# Patient Record
Sex: Female | Born: 1935
Health system: Southern US, Community
[De-identification: ages and names within clinical notes are randomized; demographics above are authoritative.]

## PROBLEM LIST (undated history)

## (undated) ENCOUNTER — Emergency Department (HOSPITAL_COMMUNITY): Admission: EM | Payer: Medicare Other | Source: Home / Self Care

## (undated) DIAGNOSIS — I340 Nonrheumatic mitral (valve) insufficiency: Secondary | ICD-10-CM

## (undated) DIAGNOSIS — M810 Age-related osteoporosis without current pathological fracture: Secondary | ICD-10-CM

## (undated) DIAGNOSIS — Z79890 Hormone replacement therapy (postmenopausal): Secondary | ICD-10-CM

## (undated) DIAGNOSIS — E785 Hyperlipidemia, unspecified: Secondary | ICD-10-CM

## (undated) DIAGNOSIS — I63442 Cerebral infarction due to embolism of left cerebellar artery: Secondary | ICD-10-CM

## (undated) DIAGNOSIS — M5030 Other cervical disc degeneration, unspecified cervical region: Secondary | ICD-10-CM

## (undated) DIAGNOSIS — I10 Essential (primary) hypertension: Secondary | ICD-10-CM

## (undated) DIAGNOSIS — K5730 Diverticulosis of large intestine without perforation or abscess without bleeding: Secondary | ICD-10-CM

## (undated) DIAGNOSIS — F419 Anxiety disorder, unspecified: Secondary | ICD-10-CM

## (undated) DIAGNOSIS — I4819 Other persistent atrial fibrillation: Secondary | ICD-10-CM

## (undated) DIAGNOSIS — J3801 Paralysis of vocal cords and larynx, unilateral: Secondary | ICD-10-CM

## (undated) DIAGNOSIS — H269 Unspecified cataract: Secondary | ICD-10-CM

## (undated) DIAGNOSIS — I422 Other hypertrophic cardiomyopathy: Secondary | ICD-10-CM

## (undated) DIAGNOSIS — L659 Nonscarring hair loss, unspecified: Secondary | ICD-10-CM

## (undated) DIAGNOSIS — D329 Benign neoplasm of meninges, unspecified: Secondary | ICD-10-CM

## (undated) DIAGNOSIS — G450 Vertebro-basilar artery syndrome: Secondary | ICD-10-CM

## (undated) DIAGNOSIS — M1990 Unspecified osteoarthritis, unspecified site: Secondary | ICD-10-CM

## (undated) DIAGNOSIS — I272 Pulmonary hypertension, unspecified: Secondary | ICD-10-CM

## (undated) HISTORY — PX: VEIN SURGERY: SHX48

## (undated) HISTORY — DX: Age-related osteoporosis without current pathological fracture: M81.0

## (undated) HISTORY — DX: Paralysis of vocal cords and larynx, unilateral: J38.01

## (undated) HISTORY — DX: Anxiety disorder, unspecified: F41.9

## (undated) HISTORY — PX: EYE SURGERY: SHX253

## (undated) HISTORY — DX: Diverticulosis of large intestine without perforation or abscess without bleeding: K57.30

## (undated) HISTORY — PX: TONSILLECTOMY: SHX5217

## (undated) HISTORY — DX: Vertebro-basilar artery syndrome: G45.0

## (undated) HISTORY — DX: Unspecified cataract: H26.9

## (undated) HISTORY — DX: Other cervical disc degeneration, unspecified cervical region: M50.30

## (undated) HISTORY — DX: Benign neoplasm of meninges, unspecified: D32.9

## (undated) HISTORY — DX: Other persistent atrial fibrillation: I48.19

## (undated) HISTORY — DX: Nonrheumatic mitral (valve) insufficiency: I34.0

## (undated) HISTORY — DX: Other hypertrophic cardiomyopathy: I42.2

## (undated) HISTORY — DX: Nonscarring hair loss, unspecified: L65.9

## (undated) HISTORY — PX: CATARACT EXTRACTION, BILATERAL: SHX1313

## (undated) HISTORY — PX: ABDOMINAL HYSTERECTOMY: SHX81

## (undated) HISTORY — DX: Pulmonary hypertension, unspecified: I27.20

## (undated) HISTORY — DX: Cerebral infarction due to embolism of left cerebellar artery: I63.442

## (undated) HISTORY — DX: Essential (primary) hypertension: I10

## (undated) HISTORY — DX: Hormone replacement therapy: Z79.890

## (undated) HISTORY — DX: Unspecified osteoarthritis, unspecified site: M19.90

## (undated) HISTORY — DX: Hyperlipidemia, unspecified: E78.5

---

## 1997-12-28 ENCOUNTER — Other Ambulatory Visit: Admission: RE | Admit: 1997-12-28 | Discharge: 1997-12-28 | Payer: Self-pay | Admitting: Obstetrics & Gynecology

## 1999-01-03 ENCOUNTER — Other Ambulatory Visit: Admission: RE | Admit: 1999-01-03 | Discharge: 1999-01-03 | Payer: Self-pay | Admitting: Obstetrics & Gynecology

## 2000-01-10 ENCOUNTER — Other Ambulatory Visit: Admission: RE | Admit: 2000-01-10 | Discharge: 2000-01-10 | Payer: Self-pay | Admitting: Obstetrics & Gynecology

## 2001-01-27 ENCOUNTER — Other Ambulatory Visit: Admission: RE | Admit: 2001-01-27 | Discharge: 2001-01-27 | Payer: Self-pay | Admitting: Obstetrics & Gynecology

## 2001-02-10 ENCOUNTER — Encounter: Payer: Self-pay | Admitting: Cardiology

## 2001-02-24 ENCOUNTER — Encounter: Payer: Self-pay | Admitting: Obstetrics & Gynecology

## 2001-02-27 ENCOUNTER — Inpatient Hospital Stay (HOSPITAL_COMMUNITY): Admission: AD | Admit: 2001-02-27 | Discharge: 2001-03-02 | Payer: Self-pay | Admitting: Obstetrics & Gynecology

## 2001-02-27 ENCOUNTER — Encounter (INDEPENDENT_AMBULATORY_CARE_PROVIDER_SITE_OTHER): Payer: Self-pay | Admitting: Specialist

## 2001-03-28 ENCOUNTER — Inpatient Hospital Stay (HOSPITAL_COMMUNITY): Admission: AD | Admit: 2001-03-28 | Discharge: 2001-03-28 | Payer: Self-pay | Admitting: Obstetrics and Gynecology

## 2002-05-20 ENCOUNTER — Encounter: Payer: Self-pay | Admitting: Gastroenterology

## 2002-05-20 ENCOUNTER — Ambulatory Visit (HOSPITAL_COMMUNITY): Admission: RE | Admit: 2002-05-20 | Discharge: 2002-05-20 | Payer: Self-pay | Admitting: Gastroenterology

## 2002-08-09 ENCOUNTER — Other Ambulatory Visit: Admission: RE | Admit: 2002-08-09 | Discharge: 2002-08-09 | Payer: Self-pay | Admitting: Obstetrics & Gynecology

## 2002-08-24 ENCOUNTER — Emergency Department (HOSPITAL_COMMUNITY): Admission: EM | Admit: 2002-08-24 | Discharge: 2002-08-25 | Payer: Self-pay | Admitting: Emergency Medicine

## 2002-12-01 ENCOUNTER — Encounter: Admission: RE | Admit: 2002-12-01 | Discharge: 2002-12-29 | Payer: Self-pay | Admitting: Orthopedic Surgery

## 2003-05-30 ENCOUNTER — Encounter: Admission: RE | Admit: 2003-05-30 | Discharge: 2003-05-30 | Payer: Self-pay | Admitting: Neurological Surgery

## 2003-06-14 ENCOUNTER — Ambulatory Visit (HOSPITAL_COMMUNITY): Admission: RE | Admit: 2003-06-14 | Discharge: 2003-06-14 | Payer: Self-pay | Admitting: Gastroenterology

## 2003-08-16 ENCOUNTER — Encounter: Admission: RE | Admit: 2003-08-16 | Discharge: 2003-08-16 | Payer: Self-pay | Admitting: Neurological Surgery

## 2003-10-11 ENCOUNTER — Ambulatory Visit (HOSPITAL_COMMUNITY): Admission: RE | Admit: 2003-10-11 | Discharge: 2003-10-11 | Payer: Self-pay | Admitting: Cardiology

## 2004-01-01 ENCOUNTER — Emergency Department (HOSPITAL_COMMUNITY): Admission: EM | Admit: 2004-01-01 | Discharge: 2004-01-01 | Payer: Self-pay | Admitting: Emergency Medicine

## 2004-01-15 ENCOUNTER — Inpatient Hospital Stay (HOSPITAL_COMMUNITY): Admission: EM | Admit: 2004-01-15 | Discharge: 2004-01-20 | Payer: Self-pay | Admitting: Psychiatry

## 2004-01-15 ENCOUNTER — Ambulatory Visit: Payer: Self-pay | Admitting: Psychiatry

## 2004-03-16 ENCOUNTER — Ambulatory Visit: Payer: Self-pay | Admitting: Psychology

## 2004-05-24 ENCOUNTER — Ambulatory Visit: Payer: Self-pay | Admitting: Psychology

## 2004-07-20 ENCOUNTER — Ambulatory Visit: Payer: Self-pay | Admitting: Psychology

## 2004-10-10 ENCOUNTER — Other Ambulatory Visit: Admission: RE | Admit: 2004-10-10 | Discharge: 2004-10-10 | Payer: Self-pay | Admitting: Obstetrics & Gynecology

## 2004-11-10 ENCOUNTER — Emergency Department (HOSPITAL_COMMUNITY): Admission: EM | Admit: 2004-11-10 | Discharge: 2004-11-10 | Payer: Self-pay | Admitting: Emergency Medicine

## 2004-11-13 ENCOUNTER — Ambulatory Visit (HOSPITAL_COMMUNITY): Admission: RE | Admit: 2004-11-13 | Discharge: 2004-11-13 | Payer: Self-pay | Admitting: Family Medicine

## 2005-06-06 ENCOUNTER — Encounter: Admission: RE | Admit: 2005-06-06 | Discharge: 2005-06-06 | Payer: Self-pay | Admitting: Neurological Surgery

## 2005-07-15 ENCOUNTER — Encounter: Admission: RE | Admit: 2005-07-15 | Discharge: 2005-07-15 | Payer: Self-pay | Admitting: Family Medicine

## 2005-08-14 ENCOUNTER — Encounter: Admission: RE | Admit: 2005-08-14 | Discharge: 2005-09-03 | Payer: Self-pay | Admitting: Internal Medicine

## 2006-07-14 ENCOUNTER — Encounter: Admission: RE | Admit: 2006-07-14 | Discharge: 2006-07-14 | Payer: Self-pay | Admitting: Neurological Surgery

## 2007-03-04 ENCOUNTER — Encounter: Admission: RE | Admit: 2007-03-04 | Discharge: 2007-03-04 | Payer: Self-pay | Admitting: Orthopaedic Surgery

## 2007-03-10 ENCOUNTER — Encounter: Admission: RE | Admit: 2007-03-10 | Discharge: 2007-03-10 | Payer: Self-pay | Admitting: Orthopaedic Surgery

## 2007-05-25 ENCOUNTER — Encounter: Admission: RE | Admit: 2007-05-25 | Discharge: 2007-05-25 | Payer: Self-pay | Admitting: Orthopaedic Surgery

## 2007-07-16 ENCOUNTER — Encounter: Admission: RE | Admit: 2007-07-16 | Discharge: 2007-07-16 | Payer: Self-pay | Admitting: Family Medicine

## 2007-09-25 ENCOUNTER — Encounter: Admission: RE | Admit: 2007-09-25 | Discharge: 2007-09-25 | Payer: Self-pay | Admitting: Orthopaedic Surgery

## 2007-10-12 ENCOUNTER — Encounter: Admission: RE | Admit: 2007-10-12 | Discharge: 2007-10-12 | Payer: Self-pay | Admitting: Family Medicine

## 2007-10-20 ENCOUNTER — Encounter: Admission: RE | Admit: 2007-10-20 | Discharge: 2007-10-20 | Payer: Self-pay | Admitting: Orthopaedic Surgery

## 2007-12-10 ENCOUNTER — Encounter: Payer: Self-pay | Admitting: Cardiology

## 2008-01-25 ENCOUNTER — Encounter: Admission: RE | Admit: 2008-01-25 | Discharge: 2008-01-25 | Payer: Self-pay | Admitting: Orthopaedic Surgery

## 2008-02-11 ENCOUNTER — Encounter: Admission: RE | Admit: 2008-02-11 | Discharge: 2008-02-11 | Payer: Self-pay | Admitting: Orthopaedic Surgery

## 2008-04-14 ENCOUNTER — Encounter: Admission: RE | Admit: 2008-04-14 | Discharge: 2008-04-14 | Payer: Self-pay | Admitting: Orthopaedic Surgery

## 2008-07-21 ENCOUNTER — Encounter: Admission: RE | Admit: 2008-07-21 | Discharge: 2008-10-19 | Payer: Self-pay | Admitting: Neurology

## 2008-07-21 ENCOUNTER — Ambulatory Visit: Payer: Self-pay | Admitting: Psychology

## 2008-08-23 ENCOUNTER — Encounter: Admission: RE | Admit: 2008-08-23 | Discharge: 2008-08-23 | Payer: Self-pay | Admitting: Family Medicine

## 2009-01-24 ENCOUNTER — Encounter: Payer: Self-pay | Admitting: Cardiology

## 2009-07-23 ENCOUNTER — Emergency Department (HOSPITAL_COMMUNITY): Admission: EM | Admit: 2009-07-23 | Discharge: 2009-07-23 | Payer: Self-pay | Admitting: Emergency Medicine

## 2009-08-15 ENCOUNTER — Telehealth (INDEPENDENT_AMBULATORY_CARE_PROVIDER_SITE_OTHER): Payer: Self-pay | Admitting: *Deleted

## 2009-09-26 DIAGNOSIS — I059 Rheumatic mitral valve disease, unspecified: Secondary | ICD-10-CM

## 2009-09-26 DIAGNOSIS — I421 Obstructive hypertrophic cardiomyopathy: Secondary | ICD-10-CM

## 2009-09-26 DIAGNOSIS — I080 Rheumatic disorders of both mitral and aortic valves: Secondary | ICD-10-CM

## 2009-10-04 ENCOUNTER — Ambulatory Visit: Payer: Self-pay | Admitting: Cardiology

## 2009-10-04 DIAGNOSIS — I10 Essential (primary) hypertension: Secondary | ICD-10-CM

## 2010-01-16 ENCOUNTER — Inpatient Hospital Stay (HOSPITAL_COMMUNITY): Admission: EM | Admit: 2010-01-16 | Discharge: 2010-01-22 | Payer: Self-pay | Admitting: Emergency Medicine

## 2010-01-19 ENCOUNTER — Encounter (INDEPENDENT_AMBULATORY_CARE_PROVIDER_SITE_OTHER): Payer: Self-pay | Admitting: Internal Medicine

## 2010-02-02 ENCOUNTER — Encounter: Payer: Self-pay | Admitting: Cardiology

## 2010-02-12 ENCOUNTER — Telehealth: Payer: Self-pay | Admitting: Cardiology

## 2010-02-19 ENCOUNTER — Ambulatory Visit: Payer: Self-pay | Admitting: Cardiology

## 2010-02-19 ENCOUNTER — Encounter: Payer: Self-pay | Admitting: Cardiology

## 2010-02-19 ENCOUNTER — Ambulatory Visit (HOSPITAL_COMMUNITY): Admission: RE | Admit: 2010-02-19 | Discharge: 2010-02-19 | Payer: Self-pay | Admitting: Cardiology

## 2010-05-21 ENCOUNTER — Encounter: Payer: Self-pay | Admitting: Cardiology

## 2010-05-27 ENCOUNTER — Encounter: Payer: Self-pay | Admitting: Family Medicine

## 2010-05-27 ENCOUNTER — Encounter: Payer: Self-pay | Admitting: Orthopaedic Surgery

## 2010-06-05 NOTE — Progress Notes (Signed)
  Recieved Records from [facility] forwarded to [place] for [place] appt 09/06/09 as a NP. [facility] [place]  August 15, 2009 10:16 AM

## 2010-06-05 NOTE — Letter (Signed)
Summary: [facility]   Imported By: Marylou Mccoy 02/14/2010 13:26:33  _____________________________________________________________________  External Attachment:    Type:   Image     Comment:   External Document

## 2010-06-05 NOTE — Progress Notes (Signed)
Summary: Calling regarding medication  Phone Note Call from Patient Call back at [facility] Phone [phone]   Caller: Patient Summary of Call: PT calling medication issues Initial call taken by: Judie Grieve,  February 12, 2010 1:44 PM  Follow-up for Phone Call        was Dr Adelene Idler pt came to Dr Antoine Poche when he retired.  taking 3 different BP meds and it has been working well but now her BP is low at times - 116/52.  had been in the hospital and at times it was low while in the hospital.  Seems like metopolol changes it.  also takes amlodipine and lisinopril.  was taking lisinopril and amlodipine 1st and then takes metoprolol around lunch time.  feels weak in the afternoon.  requested pt to take metoprolol at bedtime.  She is agreeable. She has an appt for a 2D Echo scheduled for Monday as a yearly check.  She will call back if changing the timing of the metoprolol isn't helpful Follow-up by: Charolotte Capuchin, RN,  February 12, 2010 2:17 PM

## 2010-06-05 NOTE — Assessment & Plan Note (Signed)
Summary: Patricia Villa   Visit Type:  Initial Consult Primary Provider:  Dr. Vernon Prey  CC:  HCM//MVP/MR.  History of Present Illness: The patient presents as a new patient for evaluation of hypertrophic cardiomyopathy and mitral valve prolapse. She has been followed by a different [facility] group. She reports getting yearly echocardiograms to follow this. I was able to get last year as result which demonstrated an EF of 55-65%. Mild mitral regurgitation with mitral valve prolapse. There was no report of hypertrophic cardiomyopathy as she has been told in her septal thickness was only 1 cm.  The patient has had no other cardiac problems. She is limited by back pain but can do some light housekeeping. With this she denies any chest pressure, neck or arm discomfort. She will get short of breath if she does significant activities but not with her usual. She does not describe resting shortness of breath, PND or orthopnea. She has rare palpitations but no presyncope or syncope.  Current Medications (verified): 1)  Vivelle-Dot 0.05 Mg/24hr Pttw (Estradiol) .... As Directed 2)  Vitamin D3 1000 Unit Tabs (Cholecalciferol) .... 3 Po Daily 3)  Vitamin C 500 Mg Tabs (Ascorbic Acid) .Marland Kitchen.. 1 By Mouth Daily 4)  Hydrocodone-Acetaminophen 5-500 Mg Tabs (Hydrocodone-Acetaminophen) .... As Needed 5)  Metoprolol Succinate 50 Mg Xr24h-Tab (Metoprolol Succinate) .Marland Kitchen.. 1 By Mouth Daily 6)  Aspirin 81 Mg  Tabs (Aspirin) .Marland Kitchen.. 1 By Mouth Daily 7)  Lisinopril 40 Mg Tabs (Lisinopril) .Marland Kitchen.. 1 By Mouth Daily 8)  Amlodipine Besylate 2.5 Mg Tabs (Amlodipine Besylate) .Marland Kitchen.. 1 By Mouth Daily 9)  Calcium 1200 1200-1000 Mg-Unit Chew (Calcium Carbonate-Vit D-Min) .Marland Kitchen.. 1 By Mouth Daily 10)  Alprazolam 0.25 Mg Tabs (Alprazolam) .... As Needed  Allergies (verified): 1)  ! Benadryl  Past History:  Past Medical History: Hpertrophic  cardiomyopathy (by history but not identified on the most recent echo) Mild mitral valve  prolapse with mild mitral insufficiency HTN  Past Surgical History: Hysterectomy  Family History: Father died age 9 sudden death (question related to EtOH) Mother died age 82 Brothers and sisters with IHSS  Social History: Tobacco Use - Rare previously Alcohol Use - no Drug Use - no  Review of Systems       Positive for constipation, back pain, urinary frequency. Otherwise as stated in the history of present illness negative for all other systems.  Vital Signs:  Patient profile:   77 year old female Height:      65 inches Weight:      141 pounds BMI:     23.55 Pulse rate:   48 / minute Resp:     16 per minute BP sitting:   126 / 62  (right arm)  Vitals Entered By: Marrion Coy, CNA (October 04, 2009 11:04 AM)  Physical Exam  General:  Well developed, well nourished, in no acute distress. Head:  normocephalic and atraumatic Eyes:  PERRLA/EOM intact; conjunctiva and lids normal. Mouth:  Teeth, gums and palate normal. Oral mucosa normal. Neck:  Neck supple, no JVD. No masses, thyromegaly or abnormal cervical nodes. Chest Wall:  no deformities or breast masses noted Lungs:  Clear bilaterally to auscultation and percussion.   Detailed Cardiovascular Exam  Neck    Carotids: Carotids full and equal bilaterally without bruits.      Neck Veins: Normal, no JVD.    Heart    Inspection: no deformities or lifts noted.      Palpation: normal PMI with no thrills palpable.  Auscultation: S1 and S2 within normal limits, no S3, no S4, 3/6 apical and axillary late systolic murmur, no diastolic murmurs  Vascular    Abdominal Aorta: no palpable masses, pulsations, or audible bruits.      Femoral Pulses: normal femoral pulses bilaterally.      Pedal Pulses: normal pedal pulses bilaterally.      Radial Pulses: normal radial pulses bilaterally.      Peripheral Circulation: no clubbing, cyanosis, or edema noted with normal capillary refill.     EKG  Procedure date:   10/04/2009  Findings:      Sinus rhythmnormal rate 48, left axis deviation, first degree AV block, no acute ST-T wave changes  Impression & Recommendations:  Problem # 1:  MITRAL VALVE PROLAPSE (ICD-424.0) The patient has a classic murmur of mitral valve prolapse. She has no symptoms with this. I will repeat an echocardiogram in October one year from the date of her previous echo as she has been getting. We did discuss clinical symptoms that could occur if she has worsening regurg.  Orders: Echocardiogram (Echo)  Problem # 2:  CARDIOMYOPATHY, HYPERTROPHIC (ICD-425.1) I will reassess this at the time of her echo with os though there was no evidence of this reported on the previous echo. I do not have access to those images. Orders: EKG w/ Interpretation (93000) Echocardiogram (Echo)  Problem # 3:  PALPITATIONS (ICD-785.1) She has rare palpitations and we will treat these as needed symptomatically.  Problem # 4:  ESSENTIAL HYPERTENSION, BENIGN (ICD-401.1) Her blood pressure is occasionally elevated at home and we discussed a blood pressure diary to guide further management.  Patient Instructions: 1)  Your physician recommends that you schedule a follow-up appointment in: 12 months with Dr Antoine Poche, October 2011 for 2 D Echo 2)  Your physician recommends that you continue on your current medications as directed. Please refer to the Current Medication list given to you today.

## 2010-06-05 NOTE — Letter (Signed)
Summary: [facility] 2002 - 2006  [facility] 2002 - 2006   Imported By: Roderic Ovens 10/13/2009 15:27:14  _____________________________________________________________________  External Attachment:    Type:   Image     Comment:   External Document

## 2010-06-27 ENCOUNTER — Other Ambulatory Visit: Payer: Self-pay | Admitting: Family Medicine

## 2010-06-27 DIAGNOSIS — D329 Benign neoplasm of meninges, unspecified: Secondary | ICD-10-CM

## 2010-06-28 ENCOUNTER — Ambulatory Visit
Admission: RE | Admit: 2010-06-28 | Discharge: 2010-06-28 | Disposition: A | Discharge status: Home / Self Care | Payer: Medicare Other | Source: Ambulatory Visit | Attending: Family Medicine | Admitting: Family Medicine

## 2010-06-28 DIAGNOSIS — D329 Benign neoplasm of meninges, unspecified: Secondary | ICD-10-CM

## 2010-06-28 MED ORDER — GADOBENATE DIMEGLUMINE 529 MG/ML IV SOLN
13.0000 mL | Freq: Once | INTRAVENOUS | Status: AC | PRN
  Administered 2010-06-28: 13 mL via INTRAVENOUS

## 2010-07-02 ENCOUNTER — Other Ambulatory Visit: Payer: Self-pay

## 2010-07-19 LAB — COMPREHENSIVE METABOLIC PANEL
AST: 25 U/L (ref 0–37)
AST: 27 U/L (ref 0–37)
Albumin: 3.3 g/dL — ABNORMAL LOW (ref 3.5–5.2)
Albumin: 4 g/dL (ref 3.5–5.2)
Alkaline Phosphatase: 58 U/L (ref 39–117)
BUN: 12 mg/dL (ref 6–23)
BUN: 20 mg/dL (ref 6–23)
CO2: 26 mEq/L (ref 19–32)
CO2: 28 mEq/L (ref 19–32)
Calcium: 8.6 mg/dL (ref 8.4–10.5)
Chloride: 102 mEq/L (ref 96–112)
Chloride: 105 mEq/L (ref 96–112)
Creatinine, Ser: 0.63 mg/dL (ref 0.4–1.2)
GFR calc Af Amer: >60 mL/min (ref >60)
GFR calc Af Amer: >60 mL/min (ref >60)
GFR calc non Af Amer: >60 mL/min (ref >60)
Potassium: 3.3 mEq/L — ABNORMAL LOW (ref 3.5–5.1)
Total Bilirubin: 0.7 mg/dL (ref 0.3–1.2)
Total Bilirubin: 1.2 mg/dL (ref 0.3–1.2)

## 2010-07-19 LAB — CBC
HCT: 30.1 % — ABNORMAL LOW (ref 36.0–46.0)
HCT: 30.5 % — ABNORMAL LOW (ref 36.0–46.0)
HCT: 32.1 % — ABNORMAL LOW (ref 36.0–46.0)
HCT: 34.3 % — ABNORMAL LOW (ref 36.0–46.0)
HCT: 35.3 % — ABNORMAL LOW (ref 36.0–46.0)
HCT: 35.5 % — ABNORMAL LOW (ref 36.0–46.0)
Hemoglobin: 10.3 g/dL — ABNORMAL LOW (ref 12.0–15.0)
Hemoglobin: 10.4 g/dL — ABNORMAL LOW (ref 12.0–15.0)
Hemoglobin: 10.5 g/dL — ABNORMAL LOW (ref 12.0–15.0)
Hemoglobin: 10.5 g/dL — ABNORMAL LOW (ref 12.0–15.0)
Hemoglobin: 11.7 g/dL — ABNORMAL LOW (ref 12.0–15.0)
Hemoglobin: 12.2 g/dL (ref 12.0–15.0)
Hemoglobin: 12.2 g/dL (ref 12.0–15.0)
Hemoglobin: 13.2 g/dL (ref 12.0–15.0)
MCH: 33.1 pg (ref 26.0–34.0)
MCH: 33.1 pg (ref 26.0–34.0)
MCH: 33.4 pg (ref 26.0–34.0)
MCH: 33.1 pg (ref 26.0–34.0)
MCH: 33.1 pg (ref 26.0–34.0)
MCH: 33.4 pg (ref 26.0–34.0)
MCH: 33.5 pg (ref 26.0–34.0)
MCHC: 33.8 g/dL (ref 30.0–36.0)
MCHC: 33.8 g/dL (ref 30.0–36.0)
MCHC: 34.1 g/dL (ref 30.0–36.0)
MCHC: 34.5 g/dL (ref 30.0–36.0)
MCHC: 34.2 g/dL (ref 30.0–36.0)
MCHC: 34.4 g/dL (ref 30.0–36.0)
MCV: 97.8 fL (ref 78.0–100.0)
MCV: 97.9 fL (ref 78.0–100.0)
MCV: 96.8 fL (ref 78.0–100.0)
MCV: 97 fL (ref 78.0–100.0)
MCV: 97 fL (ref 78.0–100.0)
MCV: 97.1 fL (ref 78.0–100.0)
MCV: 97.2 fL (ref 78.0–100.0)
MCV: 97.5 fL (ref 78.0–100.0)
Platelets: 190 10*3/uL (ref 150–400)
Platelets: 232 10*3/uL (ref 150–400)
Platelets: 215 10*3/uL (ref 150–400)
Platelets: 234 10*3/uL (ref 150–400)
RBC: 3.17 MIL/uL — ABNORMAL LOW (ref 3.87–5.11)
RBC: 3.53 MIL/uL — ABNORMAL LOW (ref 3.87–5.11)
RBC: 3.64 MIL/uL — ABNORMAL LOW (ref 3.87–5.11)
RBC: 3.64 MIL/uL — ABNORMAL LOW (ref 3.87–5.11)
RBC: 3.94 MIL/uL (ref 3.87–5.11)
RDW: 12.5 % (ref 11.5–15.5)
RDW: 12.9 % (ref 11.5–15.5)
RDW: 12.9 % (ref 11.5–15.5)
RDW: 12.8 % (ref 11.5–15.5)
RDW: 12.8 % (ref 11.5–15.5)
WBC: 13 10*3/uL — ABNORMAL HIGH (ref 4.0–10.5)
WBC: 20 10*3/uL — ABNORMAL HIGH (ref 4.0–10.5)
WBC: 20.3 10*3/uL — ABNORMAL HIGH (ref 4.0–10.5)

## 2010-07-19 LAB — APTT: aPTT: 39 seconds — ABNORMAL HIGH (ref 24–37)

## 2010-07-19 LAB — DIFFERENTIAL
Basophils Absolute: 0 10*3/uL (ref 0.0–0.1)
Basophils Absolute: 0.1 10*3/uL (ref 0.0–0.1)
Basophils Relative: 0 % (ref 0–1)
Eosinophils Relative: 0 % (ref 0–5)
Eosinophils Relative: 1 % (ref 0–5)
Lymphocytes Relative: 9 % — ABNORMAL LOW (ref 12–46)
Lymphs Abs: 1.6 10*3/uL (ref 0.7–4.0)
Monocytes Absolute: 0.5 10*3/uL (ref 0.1–1.0)
Neutro Abs: 14.6 10*3/uL — ABNORMAL HIGH (ref 1.7–7.7)

## 2010-07-19 LAB — BASIC METABOLIC PANEL
BUN: 3 mg/dL — ABNORMAL LOW (ref 6–23)
BUN: 4 mg/dL — ABNORMAL LOW (ref 6–23)
BUN: 5 mg/dL — ABNORMAL LOW (ref 6–23)
CO2: 29 mEq/L (ref 19–32)
CO2: 31 mEq/L (ref 19–32)
Calcium: 8.3 mg/dL — ABNORMAL LOW (ref 8.4–10.5)
Chloride: 103 mEq/L (ref 96–112)
Creatinine, Ser: 0.55 mg/dL (ref 0.4–1.2)
GFR calc non Af Amer: >60 mL/min (ref >60)
GFR calc non Af Amer: >60 mL/min (ref >60)
Glucose, Bld: 104 mg/dL — ABNORMAL HIGH (ref 70–99)
Glucose, Bld: 110 mg/dL — ABNORMAL HIGH (ref 70–99)
Glucose, Bld: 126 mg/dL — ABNORMAL HIGH (ref 70–99)
Potassium: 3 mEq/L — ABNORMAL LOW (ref 3.5–5.1)
Potassium: 3.6 mEq/L (ref 3.5–5.1)
Sodium: 136 mEq/L (ref 135–145)
Sodium: 137 mEq/L (ref 135–145)

## 2010-07-19 LAB — URINALYSIS, ROUTINE W REFLEX MICROSCOPIC
Glucose, UA: NEGATIVE mg/dL
Ketones, ur: NEGATIVE mg/dL
Protein, ur: 30 mg/dL — AB
Urobilinogen, UA: 1 mg/dL (ref 0.0–1.0)

## 2010-07-19 LAB — PROTIME-INR
INR: 1.09 (ref 0.00–1.49)
Prothrombin Time: 14.3 seconds (ref 11.6–15.2)

## 2010-07-19 LAB — OVA AND PARASITE EXAMINATION

## 2010-07-19 LAB — STOOL CULTURE

## 2010-07-19 LAB — CLOSTRIDIUM DIFFICILE BY PCR: Toxigenic C. Difficile by PCR: NOT DETECTED

## 2010-07-19 LAB — ABO/RH: ABO/RH(D): O POS

## 2010-07-19 LAB — MAGNESIUM
Magnesium: 1.7 mg/dL (ref 1.5–2.5)
Magnesium: 1.6 mg/dL (ref 1.5–2.5)

## 2010-07-19 LAB — URINE MICROSCOPIC-ADD ON

## 2010-07-30 LAB — URINALYSIS, ROUTINE W REFLEX MICROSCOPIC
Ketones, ur: NEGATIVE mg/dL
Nitrite: NEGATIVE
Protein, ur: NEGATIVE mg/dL
pH: 5.5 (ref 5.0–8.0)

## 2010-07-30 LAB — POCT I-STAT, CHEM 8
BUN: 21 mg/dL (ref 6–23)
Creatinine, Ser: 0.8 mg/dL (ref 0.4–1.2)
Hemoglobin: 13.9 g/dL (ref 12.0–15.0)
Potassium: 3.8 mEq/L (ref 3.5–5.1)
Sodium: 137 mEq/L (ref 135–145)

## 2010-07-30 LAB — DIFFERENTIAL
Eosinophils Absolute: 0.1 10*3/uL (ref 0.0–0.7)
Lymphocytes Relative: 14 % (ref 12–46)
Lymphs Abs: 1.5 10*3/uL (ref 0.7–4.0)
Neutrophils Relative %: 77 % (ref 43–77)

## 2010-07-30 LAB — COMPREHENSIVE METABOLIC PANEL
BUN: 18 mg/dL (ref 6–23)
CO2: 28 mEq/L (ref 19–32)
Calcium: 9.3 mg/dL (ref 8.4–10.5)
Creatinine, Ser: 0.82 mg/dL (ref 0.4–1.2)
GFR calc non Af Amer: >60 mL/min (ref >60)
Glucose, Bld: 103 mg/dL — ABNORMAL HIGH (ref 70–99)

## 2010-07-30 LAB — URINE CULTURE: Colony Count: NO GROWTH

## 2010-07-30 LAB — CBC
Hemoglobin: 12.9 g/dL (ref 12.0–15.0)
MCHC: 32.5 g/dL (ref 30.0–36.0)
MCV: 97.8 fL (ref 78.0–100.0)
RBC: 4.06 MIL/uL (ref 3.87–5.11)

## 2010-07-30 LAB — CLOSTRIDIUM DIFFICILE EIA

## 2010-09-10 ENCOUNTER — Encounter: Payer: Self-pay | Admitting: Cardiology

## 2010-09-21 NOTE — H&P (Signed)
[facility]  Patient:    Patricia Villa, Patricia Villa Visit Number: [id number] MRN: [id number]          Service Type: Attending:  Freddy Finner, M.D. Dictated by:   Freddy Finner, M.D. Adm. Date:  02/27/01                           History and Physical  ADMITTING DIAGNOSES:          Third degree cystocele, second degree rectocele, marked uterine descensus.  HISTORY:                      Patient is a 77 year old white married female gravida 2, para 2 who is postmenopausal and has been on hormone replacement therapy for approximately 18 years.  She presented to the office recently for her regular annual GYN examination and complained of significant increased lower abdominal pressure and protrusion of a mass from her vaginal area.  This was first noted in August 2002.  On examination in the office she does, indeed, have cystocele with prolapse with Valsalva, rectocele which prolapses to the vaginal introitus, and marked uterine descensus.  She has requested definitive surgical intervention and is admitted at this time for that purpose.  REVIEW OF SYSTEMS:            Otherwise negative.  She has no specific cardiopulmonary, GI, or GU complaints.  PAST MEDICAL HISTORY:         Patient is known to have hypertrophic cardiomyopathy which is non-obstructive and she has concentric left ventricular hypertrophy with hyperdynamic left ventricle.  She has moderate mitral valve prolapse with mild mitral insufficiency and a trace of tricuspid and trace of pulmonary valve insufficiency.  She is followed for her cardiac condition by Dr. Charolette Child.  Her condition is considered to be stable by recent echocardiogram done in early October 2002.  Patient is also known to have osteopenia.  She is also known to have osteoarthritis.  ALLERGIES:                    No known drug allergies.  CURRENT MEDICATIONS:          1. Combipatch 50/140.                               2.  Zentil.                               3. Fosamax 70 mg weekly.                               4. Doxepin.                               5. Xanax.  FAMILY HISTORY:               Noncontributory.  PHYSICAL EXAMINATION  HEENT:                        Grossly within normal limits.  VITAL SIGNS:                  Blood pressure in the office 150/62, weight 141 pounds.  NECK:                         There is no palpable increase of the thyroid.  BREASTS:                      Normal.  No palpable masses.  No skin change. No nipple discharge.  HEART:                        Normal sinus rhythm with a blowing systolic murmur grade 3/6.  CHEST:                        Clear to auscultation.  ABDOMEN:                      Soft and nontender without fistula or organomegaly.  EXTREMITIES:                  Without cyanosis, clubbing, or edema.  PELVIC:                       Basically as described above.  ASSESSMENT:                   Symptomatic pelvic relaxation with third degree cystocele, second degree rectocele and uterine descensus.  PLAN:                         Total vaginal hysterectomy, anterior and posterior colorrhaphy, antibiotic prophylaxis for cardiac abnormalities. Patient has reviewed a video in the office describing the operative procedure and the potential risks.  She feels sufficiently ready to proceed with this at this time.  She has been cleared for surgery by her cardiologist, Dr. Aleen Campi, whose recent echocardiogram is noted above.Dictated by:   Freddy Finner, M.D. Attending:  Freddy Finner, M.D. DD:  02/26/01 TD:  02/26/01 Job: [id number]

## 2010-09-21 NOTE — Discharge Summary (Signed)
[facility]  Patient:    Patricia Villa, CARTELLI Visit Number: [id number] MRN: [id number]          Service Type: GYN Location: 910A [id number] 01 Attending Physician:  Minette Headland Dictated by:   Freddy Finner, M.D. Admit Date:  02/27/2001 Discharge Date: 03/02/2001                             Discharge Summary  DISCHARGE DIAGNOSES:          1. Cystocele and prolapse.                               2. Second degree rectocele.                               3. Histologic finding of uterine adenomyosis and                                  small uterine leiomyomata.                               4. Clinically symptomatic pelvic relaxation with                                  cystocele and rectocele.  OPERATIVE PROCEDURE:          Total vaginal hysterectomy, bilateral salpingo-oophorectomy, anterior and posterior colporrhaphy.  COMPLICATIONS:                None.  DISPOSITION:                  The patient is in satisfactory and good condition at the time of her discharge.  She seemed to have progressively increasing physical activity, but no vaginal ________ or heavy lifting.  She is to resume all of her preoperative medications except for a _______.  She is to replace that with Vivelle 0.5 mg per day.  She was given Percocet 5 mg to be taken for pain.  She was given Vioxx 25 mg to be taken as needed for pain.  She is to call for fever, severe pain, or heavy bleeding.  She was given instructions on managing her suprapubic catheter, and instructions on when to call for removal.  She is to return to the office in two weeks or sooner as needed for removal of catheter.  HISTORY OF PRESENT ILLNESS:   Is recorded in the admission note.  Her physical findings on admission were remarkable for a grade 3/6 systolic murmur on auscultation of the heart, and for pelvic findings which are as noted in the postoperative discharge diagnoses.  The patient is noted to  have hypertrophic cardiomyopathy which is not obstructive, and she has been followed and cleared for surgery by Dr. Charolette Child for this problem.  LABORATORY DATA:              During this admission includes admission hemoglobin of 12.7, postoperative hemoglobin of 9.9 on the first postoperative day, and 9.9 on the second postoperative day.  Her prothrombin time, PTT, and INR were normal on admission.  Urinalysis was normal.  HOSPITAL  COURSE:              The patient was admitted on the morning of surgery.  She was give ampicillin and gentamicin IV preoperatively as prophylaxis not only for the surgery, but for her murmur.  This was continued for a period of approximately 24 hours perioperatively.  She was placed in PAS hose.  She was taken to the operating room, and there, placed under adequate general anesthesia, placed in the dorsolithotomy position using the [place] stirrups systems where the above described procedure was accomplished without difficulty.  Her postoperative course was without significant complications.  She remained afebrile throughout the postoperative stay.  By the morning of the second postoperative day, her condition was satisfactory.  She was discharged with further disposition as noted above. Dictated by:   Freddy Finner, M.D. Attending Physician:  Minette Headland DD:  03/25/01 TD:  03/25/01 Job: [id number]

## 2010-09-21 NOTE — Op Note (Signed)
[facility]  Patient:    Patricia Villa, Patricia Villa Visit Number: [id number] MRN: [id number]          Service Type: GYN Location: 9300 [id number] Attending Physician:  Minette Headland Dictated by:   Freddy Finner, M.D. Proc. Date: 02/27/01 Admit Date:  02/27/2001                             Operative Report  PREOPERATIVE DIAGNOSES:         1. Third degree cystocele.                                 2. Second degree rectocele.                                 3. Marked uterine descenscus.  POSTOPERATIVE DIAGNOSES:        1. Third degree cystocele.                                 2. Second degree rectocele.                                 3. Marked uterine descenscus.  OPERATION:                      Total vaginal hysterectomy, bilateral salpingo-oophorectomy, anterior and posterior colporrhaphy.  SURGEON:                        Freddy Finner, M.D.  ASSISTANTWilley Blade, M.D.  ESTIMATED BLOOD LOSS:           Less than or equal to 200 cc.  INTRAOPERATIVE COMPLICATIONS:   None.  INDICATIONS:                    The patient is a 77 year old who was admitted on the morning of surgery.  She received antibiotic prophylaxis because of significant heart murmur and valvular insufficiency.  She was given 2 g ampicillin and 80 mg of gentamicin IV preoperatively.  She was placed in PAS hose.  She was brought to the operating room and there placed under general endotracheal anesthesia.  She was placed in the dorsal lithotomy position using the [employer] system.  Betadine prep with scrub followed by solution was carried out prepping the lower abdomen, mons, upper thighs and vagina, perineum.  Sterile drapes were applied.  A posterior weighted vaginal retractor was placed.  The cervix was grasped with a single-tooth tenaculum on the posterior lip.  A posterior colpotomy incision was made while tenting mucosa with an Allis posterior to  the cervix.  The cervix was circumscribed with the scalpel to release the mucosa.  Using the LigaSure system, the uterosacral ligaments, bladder pillars, cardinal ligaments were controlled and divided sharply.  The bladder was carefully advanced off the cervix and the anterior peritoneum entered.  Vessel pedicles were taken on each side using the LigaSure system.  Additional pedicle was taken above the vessels on each side.  The uterus was delivered through the introitus and curved Haneys placed across the utero-ovarian pedicles on either side of the uterus removed. Using the LigaSure system, the infundibulopelvic ligaments were taken above the tube and ovary on each side and sealed.  Tubes and ovaries were removed. Hemostasis at this level was complete.  Angles of the vagina were grasped with Allis and a mattress suture of 0 Monocryl was used to anchor the vaginal angle to the uterosacral on each side.  The uterosacrals were plicated with two sutures of 0 Monocryl in an interrupted fashion.  The posterior cuff was then closed vertically with figure-of-eight of 0 Monocryl.  Edges of the cuff anterior were grasped with Allis.  The mucosa was tented in the midline.  With careful sharp and blunt dissection, the mucosa was dissected away from the bladder and an incision made from the midline to a level approximately 1 cm proximal to the urethral meatus.  With careful sharp and blunt dissection, the bladder and vesicovaginal fascia was separated from the mucosa.  A total of four plication sutures were then used to reapproximate the vesicovaginal fascia and to reduce the cystocele and elevate the urethrovesical angle.  The urethra was explored with a sterile Tresa Endo.  The urethral length was at least 2.5 cm and the urethra was patent.  Segments of mucosa were excised anteriorly.  Additional figure-of-eight suture was placed at the cuff for complete closure.  The mucosa incision was then closed with  a running 2-0 Monocryl suture.  Attention was turned posteriorly.  The mucosa was grasped with a Fourchette on either side of the foraminal shaped segment of skin removed from the perineal body.  The mucosa was overlying the rectum was carefully dissected with sharp and blunt dissection and midline incision made.  The perirectal tissues were then plicated with interrupted sutures of 0 Monocryl.  The layers were approximated to the length of the perineal body.  An additional suture was required for approximation of tissue at the perineal body.  The mucosa was then closed with running 2-0 Monocryl in a fashion similar to an episiotomy repair.  The bladder was filled with 300 cc of irrigating solution. Suprapubic banana catheter was placed, and anchored to the skin with nylon sutures.  Then 2 inch Iodoform gauze was placed in the vagina.  The patient tolerated the operative procedure well, was awakened and taken to the recovery room in good condition. Dictated by:   Freddy Finner, M.D. Attending Physician:  Minette Headland DD:  02/27/01 TD:  02/27/01 Job: [id number]

## 2010-09-21 NOTE — Discharge Summary (Signed)
NAMERAYNESHA, TIEDT NO.:  [id number]   MEDICAL RECORD NO.:  [id number]          PATIENT TYPE:  IPS   LOCATION:  0506                          FACILITY:  [facility]   PHYSICIAN:  Geoffery Lyons, M.D.      DATE OF BIRTH:  [birth year]   DATE OF ADMISSION:  01/15/2004  DATE OF DISCHARGE:  01/20/2004                                 DISCHARGE SUMMARY   CHIEF COMPLAINT AND PRESENT ILLNESS:  This was the first admission to [facility] for this 77 year old female, married,  voluntarily admitted.  Referred by her primary physician for history of  depression, increased panic attacks, some suicidal ideas during the panic.  Chronic worry since having a urinary tract infection in January 2005.  One  month of progressive anhedonia, episodes of panic, sweats, unable to  concentrate, unable to enjoy line dancing and social activities she used to  do before.  Positive suicidal ideations, but passive, not with plan.   PAST PSYCHIATRIC HISTORY:  This is the first time Behavior Health.  Sees  primary care Atira Borello and Dr. Milford Cage.  She has been on Lexapro; that  upset her stomach.  She was apparently on Elavil that was switched to  doxepin in the past.  She has apparently been on Seroquel.   ALCOHOL AND DRUG HISTORY:  Denies the use or abuse of any substances.   PAST MEDICAL HISTORY:  Constipation.   MEDICATIONS:  1.  Xanax 0.5 one-half 3 times a day and 2 tablets at night.  2.  Prozac 10 mg daily, stopped in January 13, 2004.  3.  Nexium 40 mg daily.  4.  Lisinopril 20 mg daily.  5.  Aspirin.   PHYSICAL EXAMINATION:  Performed and failed show any acute findings.   LABORATORY DATA:  CBC within normal limits.  Blood chemistry within normal  limits.  Liver profile within normal limits.  TSH 0.806.  Drug screen  positive for benzodiazepines.   MENTAL STATUS EXAM:  Reveals a fully alert anxious female, withdrawn and at  times tearful.  Speech - slow  production.  Mood anxious, depressed, sad,  ruminating about her symptoms.  Thought processes were logical, coherent and  relevant.  Some vague passive suicide ideation, no plan.  Feeling  overwhelmed with all this stuff going on in her life.  Endorsed no homicidal  ideas.  There was no evidence of hallucinations.  Cognition well preserved.   ADMISSION DIAGNOSIS:   AXIS I:  1.  Major depression, recurrent.  2.  Panic attacks.   AXIS II:  No diagnosis.   AXIS III:  1.  Cardiomyopathy.  2.  Constipation.   AXIS IV:  Moderate.   AXIS V:  1.  Global assessment of functioning upon admission:  38.  2.  Highest global assessment of functioning in the last year:  32.   HOSPITAL COURSE:  She was admitted and started in intensive individual and  group psychotherapy.  She was given Ambien for sleep.  She was maintained on  the lisinopril 20 mg daily, Prozac 20 mg daily, Toprol  XL 25 mg daily,  aspirin 81 mg daily, Nexium 41 mg daily, Vivelle patch weekly.  Xanax was  adjusted to take 0.5 three times a day and 1 at night, and then it was  readjusted to 0.25 three times a day and 0.5 at night.  She was offered it  p.r.n. in between dosages.  She was switched from Prozac to Celexa 10 mg per  day.  She initially endorsed being very depressed, overwhelmed with all that  was going on, basically somatically focused.  Worries.  Ruminates.  Aware of  the possibility of increased tolerance to the Xanax, willing to keep it to  the minimum.  She continued to endorse the anxiety, difficulty with sleep.  Feeling overwhelmed.  Somatically focused.  Concerned with side effects from  the medications, ruminating, worrying to the point of almost obsessing.  Was  willing to pursue the Celexa.  She felt she was tolerating the Celexa better  than the Prozac.  Stable with less dose of Xanax.  Upset for being in the  unit because she started listening to other people's stress, and she was  having a hard time  dealing with that.  Mood was depressed and anxious.  Affect anxious if tearful.  Starting not sleeping with the Ambien, so we  switched to [facility].   On January 20, 2004, she was in full contact with reality.  No suicidal  ideas, no homicidal ideas, no hallucinations, no delusions.  Able to  tolerate the medication, the Celexa, did sleep better with the Lunesta,  willing to give it a try.  Still worrying and ruminating, willing to pursue  outpatient treatment, but definitely no suicidal ideas or homicidal ideas  were noticed.  She felt that she would benefit more by going to an  outpatient program, so we went ahead and discharged to outpatient followup.   DISCHARGE DIAGNOSIS:   AXIS I:  1.  Major depression, recurrent.  2.  Panic attacks.   AXIS II:  No diagnosis.   AXIS III:  1.  Cardiomyopathy.  2.  Constipation.   AXIS IV:  Moderate.   AXIS V:  Global assessment of functioning on discharge:  55-60.   DISCHARGE MEDICATIONS:  1.  Prinivil 20 mg daily.  2.  Aspirin 81 mg daily.  3.  Toprol XL 25 mg daily.  4.  Vivelle patch 0.5 every 7 days.  5.  Xanax 0.25 one 3 times a day and 2 at night.  6.  Celexa 10 mg daily.  7.  MiraLax 17 grams daily.  8.  Lunesta 3 mg at night, as needed for sleep.   FOLLOW UP:  Dr. Milford Cage.     Farrel Gordon   IL/MEDQ  D:  02/14/2004  T:  02/15/2004  Job:  [id number]

## 2010-09-21 NOTE — Op Note (Signed)
   Patricia Villa, Patricia Villa                        ACCOUNT NO.:  [id number]   MEDICAL RECORD NO.:  [id number]                   PATIENT TYPE:  AMB   LOCATION:  ENDO                                 FACILITY:  [facility]   PHYSICIAN:  John C. Madilyn Fireman, M.D.                 DATE OF BIRTH:  Jul 30, 1932   DATE OF PROCEDURE:  05/20/2002  DATE OF DISCHARGE:                                 OPERATIVE REPORT   PROCEDURE:  Colonoscopy.   INDICATIONS FOR PROCEDURE:  Colon cancer screening in a 77 year old patient  with no prior colon screening.   DESCRIPTION OF PROCEDURE:  The patient was placed in the left lateral  decubitus position then placed on the pulse monitor with continuous low flow  oxygen delivered by nasal cannula. She was sedated with 100 mcg IV fentanyl  and 10 mg IV Versed. The Olympus video colonoscope was inserted into the  rectum and advanced to the cecum, confirmed by transillumination at  McBurney's point and visualization of the ileocecal valve and appendiceal  orifice. The prep was excellent. There was considerable difficulty reaching  the cecum presumably due to fixation and adhesions and previous  hysterectomy. The transverse and descending colon all appeared normal with  no masses, polyp, diverticula or other mucosal abnormalities. There was  considerable sigmoid diverticulosis with no other abnormalities noted. The  rectum appeared normal and retroflexed view of the anus revealed no obvious  internal hemorrhoids. The colonoscope was then withdrawn and the patient  returned to the recovery room in stable condition. The patient tolerated the  procedure well and there were no immediate complications.   IMPRESSION:  Sigmoid diverticulosis.   PLAN:  Will plan repeat sigmoidoscopy in five years and possible colonoscopy  in 10 years.                                               John C. Madilyn Fireman, M.D.    J[facility]/MEDQ  D:  05/20/2002  T:  05/20/2002  Job:  [id number]   cc:   [name], M.D.  [address] [place]  [place] [address]  Fax: [phone]

## 2010-09-21 NOTE — Op Note (Signed)
NAMEMENNA, ABELN                        ACCOUNT NO.:  [id number]   MEDICAL RECORD NO.:  [id number]                   PATIENT TYPE:  AMB   LOCATION:  ENDO                                 FACILITY:  [facility]   PHYSICIAN:  John C. Madilyn Fireman, M.D.                 DATE OF BIRTH:  01/15/1933   DATE OF PROCEDURE:  06/14/2003  DATE OF DISCHARGE:                                 OPERATIVE REPORT   PROCEDURE:  Endoscopy.   INDICATIONS FOR PROCEDURE:  Refractory dyspepsia despite proton pump  inhibitor, since being started on Actonel for osteoporosis.   DESCRIPTION OF PROCEDURE:  The patient was placed in the left lateral  decubitus position and placed on the pulse monitor, with continuous low-flow  oxygen delivered by nasal cannula.  She was sedated with 62.5 mg IV Fentanyl  and 6 mg IV Versed.  The Olympus video endoscope was advanced under direct  vision into the oropharynx and esophagus.  The esophagus was straight and of  normal caliber to the squamocolumnar line at 38 cm.  I did not see any  esophageal ulcer, visible parostitis, ring, stricture or other abnormality  of the esophagus or GE junction.   The stomach was entered and a small amount of liquid secretions were  suctioned from the fundus.  Retroflexed view of the cardia was unremarkable.  The fundus and body appeared normal.  There was mild patchy antral erythema,  with slight granularity (consistent with mild antral gastritis).  There were  no focal erosions or ulcers.  A CLOtest was obtained.  The pylorus was not  deformed and easily allowed passage of the endoscope tip into the duodenum,  but the bulb and second portion were well inspected and appeared to be  within normal limits.   The scope was then withdrawn and the patient returned to the recovery room  in stable condition.  She tolerated the procedure well and there were no  immediate complications.   IMPRESSION:  1. Mild antral gastritis.  2. Otherwise normal  endoscopy.   PLAN:  Await CLOtest and treat for eradication of Helicobacter positivity.  Otherwise, continue proton pump inhibitor.  Avoid Actonel for now.                                               John C. Madilyn Fireman, M.D.    JCH/MEDQ  D:  06/14/2003  T:  06/14/2003  Job:  [id number]   cc:   [name], M.D.  [address] [place]  [place] [address]  Fax: [phone]

## 2010-10-04 ENCOUNTER — Encounter: Payer: Self-pay | Admitting: Cardiology

## 2010-10-17 ENCOUNTER — Encounter: Payer: Self-pay | Admitting: Cardiology

## 2010-10-17 ENCOUNTER — Ambulatory Visit (INDEPENDENT_AMBULATORY_CARE_PROVIDER_SITE_OTHER): Payer: Medicare Other | Admitting: Cardiology

## 2010-10-17 DIAGNOSIS — I059 Rheumatic mitral valve disease, unspecified: Secondary | ICD-10-CM

## 2010-10-17 DIAGNOSIS — I421 Obstructive hypertrophic cardiomyopathy: Secondary | ICD-10-CM

## 2010-10-17 DIAGNOSIS — I080 Rheumatic disorders of both mitral and aortic valves: Secondary | ICD-10-CM

## 2010-10-17 DIAGNOSIS — R0989 Other specified symptoms and signs involving the circulatory and respiratory systems: Secondary | ICD-10-CM | POA: Insufficient documentation

## 2010-10-17 DIAGNOSIS — I10 Essential (primary) hypertension: Secondary | ICD-10-CM

## 2010-10-17 DIAGNOSIS — R002 Palpitations: Secondary | ICD-10-CM

## 2010-10-17 NOTE — Patient Instructions (Addendum)
You are being scheduled for a carotid doppler to be done in the [place] office.  You will be called with the results. You will be contact to schedule a 2 D Echo of your heart in October. Continue your current medications. Follow up in 1 year with Dr Antoine Poche in [place].

## 2010-10-17 NOTE — Progress Notes (Signed)
HPI The patient presents for yearly followup. Since I last saw her she has had no new cardiovascular complaints. She did have an episode of colitis. She is not overly active taking care of her ill husband. She denies however any chest pressure, neck or arm discomfort. She has no palpitations, presyncope or syncope. She denies any PND or orthopnea. She has had no weight gain or edema. She is a little bit limited by joint pains.   Allergies  Allergen Reactions  . Diphenhydramine Hcl   . Livalo (Pitavastatin Calcium)     Current Outpatient Prescriptions  Medication Sig Dispense Refill  . ALPRAZolam (XANAX) 0.25 MG tablet Take 0.25 mg by mouth as needed.        Marland Kitchen amLODipine (NORVASC) 2.5 MG tablet Take 2.5 mg by mouth daily.        Marland Kitchen aspirin 81 MG tablet Take 81 mg by mouth daily.        . Calcium Carbonate-Vit D-Min (CALCIUM 1200) 1200-1000 MG-UNIT CHEW Chew 1 Units by mouth daily.        . Cholecalciferol (VITAMIN D3) 1000 UNITS tablet Take 3,000 Units by mouth daily.        . DULoxetine (CYMBALTA) 60 MG capsule Take 60 mg by mouth daily.        Marland Kitchen estradiol (VIVELLE-DOT) 0.05 MG/24HR Place 1 patch onto the skin as directed.        . hydrocodone-acetaminophen (LORCET-HD) 5-500 MG per capsule Take 1 capsule by mouth as needed.        Marland Kitchen lisinopril (PRINIVIL,ZESTRIL) 40 MG tablet Take 40 mg by mouth daily.        . metoprolol (TOPROL-XL) 50 MG 24 hr tablet Take 50 mg by mouth daily.        . Multiple Vitamin (MULTIVITAMIN) capsule Take 1 capsule by mouth daily.        . vitamin C (ASCORBIC ACID) 500 MG tablet Take 500 mg by mouth daily.          Past Medical History  Diagnosis Date  . Hypertrophic cardiomyopathy     by history but not identified on the most recent echo  . Mild mitral valve prolapse   . Mitral insufficiency     Mild  . Hypertension     Past Surgical History  Procedure Date  . Abdominal hysterectomy     ROS:  As stated in the HPI and negative for all other  systems.  PHYSICAL EXAM BP 126/62  Pulse 52  Resp 16  Ht 5\' 5"  (1.651 m)  Wt 143 lb (64.864 kg)  BMI 23.80 kg/m2 GENERAL:  Well appearing HEENT:  Pupils equal round and reactive, fundi not visualized, oral mucosa unremarkable NECK:  No jugular venous distention, waveform within normal limits, right soft carotid bruits, no thyromegaly LYMPHATICS:  No cervical, inguinal adenopathy LUNGS:  Clear to auscultation bilaterally BACK:  No CVA tenderness CHEST:  Unremarkable HEART:  PMI not displaced or sustained,S1 and S2 within normal limits, no S3, no S4, no clicks, no rubs, apical holosystolic murmur 3/6 ABD:  Flat, positive bowel sounds normal in frequency in pitch, no bruits, no rebound, no guarding, no midline pulsatile mass, no hepatomegaly, no splenomegaly EXT:  2 plus pulses throughout, no edema, no cyanosis no clubbing SKIN:  No rashes no nodules NEURO:  Cranial nerves II through XII grossly intact, motor grossly intact throughout PSYCH:  Cognitively intact, oriented to person place and time   EKG:  Sinus rhythm, rate 52, left  fascicular block, left axis deviation, first degree AV block, mild increase in QRS duration compared with previous.   ASSESSMENT AND PLAN

## 2010-10-17 NOTE — Assessment & Plan Note (Signed)
She has no new symptoms.  I will follow this with an echo in October, one year from the previous.  She needs SBE prophylaxis.

## 2010-10-17 NOTE — Assessment & Plan Note (Signed)
She has mild septal hypertrophy which will be followed up with repeat echo.

## 2010-10-17 NOTE — Assessment & Plan Note (Signed)
The pateint has a soft right bruit and a followup with carotid Doppler.

## 2010-10-17 NOTE — Assessment & Plan Note (Signed)
The blood pressure is at target. No change in medications is indicated. We will continue with therapeutic lifestyle changes (TLC).  

## 2010-10-22 ENCOUNTER — Encounter (INDEPENDENT_AMBULATORY_CARE_PROVIDER_SITE_OTHER): Payer: Medicare Other | Admitting: *Deleted

## 2010-10-22 ENCOUNTER — Encounter: Payer: Self-pay | Admitting: Cardiology

## 2010-10-22 DIAGNOSIS — R0989 Other specified symptoms and signs involving the circulatory and respiratory systems: Secondary | ICD-10-CM

## 2010-10-29 ENCOUNTER — Telehealth: Payer: Self-pay | Admitting: Cardiology

## 2010-10-29 NOTE — Telephone Encounter (Signed)
Results of carotid dopplers given. 0-39% and f/u in 2 years.  Pt states understanding.

## 2010-10-29 NOTE — Telephone Encounter (Signed)
Test results

## 2011-02-04 ENCOUNTER — Encounter: Payer: Self-pay | Admitting: Cardiology

## 2011-02-05 ENCOUNTER — Other Ambulatory Visit: Payer: Self-pay | Admitting: Family Medicine

## 2011-02-07 ENCOUNTER — Ambulatory Visit
Admission: RE | Admit: 2011-02-07 | Discharge: 2011-02-07 | Disposition: A | Discharge status: Home / Self Care | Payer: Medicare Other | Source: Ambulatory Visit | Attending: Family Medicine | Admitting: Family Medicine

## 2011-05-10 DIAGNOSIS — D320 Benign neoplasm of cerebral meninges: Secondary | ICD-10-CM | POA: Diagnosis not present

## 2011-05-10 DIAGNOSIS — H5000 Unspecified esotropia: Secondary | ICD-10-CM | POA: Diagnosis not present

## 2011-05-16 DIAGNOSIS — H539 Unspecified visual disturbance: Secondary | ICD-10-CM | POA: Diagnosis not present

## 2011-05-16 DIAGNOSIS — M159 Polyosteoarthritis, unspecified: Secondary | ICD-10-CM | POA: Diagnosis not present

## 2011-05-16 DIAGNOSIS — M545 Low back pain: Secondary | ICD-10-CM | POA: Diagnosis not present

## 2011-05-16 DIAGNOSIS — E785 Hyperlipidemia, unspecified: Secondary | ICD-10-CM | POA: Diagnosis not present

## 2011-05-16 DIAGNOSIS — I059 Rheumatic mitral valve disease, unspecified: Secondary | ICD-10-CM | POA: Diagnosis not present

## 2011-05-16 DIAGNOSIS — E559 Vitamin D deficiency, unspecified: Secondary | ICD-10-CM | POA: Diagnosis not present

## 2011-05-16 DIAGNOSIS — F411 Generalized anxiety disorder: Secondary | ICD-10-CM | POA: Diagnosis not present

## 2011-05-16 DIAGNOSIS — F329 Major depressive disorder, single episode, unspecified: Secondary | ICD-10-CM | POA: Diagnosis not present

## 2011-05-17 ENCOUNTER — Encounter: Payer: Self-pay | Admitting: Cardiology

## 2011-05-22 ENCOUNTER — Other Ambulatory Visit: Payer: Self-pay | Admitting: Family Medicine

## 2011-05-22 DIAGNOSIS — D329 Benign neoplasm of meninges, unspecified: Secondary | ICD-10-CM

## 2011-05-28 ENCOUNTER — Ambulatory Visit
Admission: RE | Admit: 2011-05-28 | Discharge: 2011-05-28 | Disposition: A | Discharge status: Home / Self Care | Payer: Medicare Other | Source: Ambulatory Visit | Attending: Family Medicine | Admitting: Family Medicine

## 2011-05-28 DIAGNOSIS — D329 Benign neoplasm of meninges, unspecified: Secondary | ICD-10-CM

## 2011-05-28 DIAGNOSIS — R221 Localized swelling, mass and lump, neck: Secondary | ICD-10-CM | POA: Diagnosis not present

## 2011-05-28 DIAGNOSIS — I6789 Other cerebrovascular disease: Secondary | ICD-10-CM | POA: Diagnosis not present

## 2011-05-28 DIAGNOSIS — R220 Localized swelling, mass and lump, head: Secondary | ICD-10-CM | POA: Diagnosis not present

## 2011-05-28 MED ORDER — GADOBENATE DIMEGLUMINE 529 MG/ML IV SOLN
13.0000 mL | Freq: Once | INTRAVENOUS | Status: AC | PRN
  Administered 2011-05-28: 13 mL via INTRAVENOUS

## 2011-06-03 DIAGNOSIS — J029 Acute pharyngitis, unspecified: Secondary | ICD-10-CM | POA: Diagnosis not present

## 2011-06-03 DIAGNOSIS — Z1231 Encounter for screening mammogram for malignant neoplasm of breast: Secondary | ICD-10-CM | POA: Diagnosis not present

## 2011-08-19 DIAGNOSIS — E559 Vitamin D deficiency, unspecified: Secondary | ICD-10-CM | POA: Diagnosis not present

## 2011-08-19 DIAGNOSIS — I10 Essential (primary) hypertension: Secondary | ICD-10-CM | POA: Diagnosis not present

## 2011-08-19 DIAGNOSIS — M159 Polyosteoarthritis, unspecified: Secondary | ICD-10-CM | POA: Diagnosis not present

## 2011-08-19 DIAGNOSIS — R300 Dysuria: Secondary | ICD-10-CM | POA: Diagnosis not present

## 2011-08-19 DIAGNOSIS — E785 Hyperlipidemia, unspecified: Secondary | ICD-10-CM | POA: Diagnosis not present

## 2011-08-28 DIAGNOSIS — H524 Presbyopia: Secondary | ICD-10-CM | POA: Diagnosis not present

## 2011-08-28 DIAGNOSIS — H2510 Age-related nuclear cataract, unspecified eye: Secondary | ICD-10-CM | POA: Diagnosis not present

## 2011-08-28 DIAGNOSIS — H43399 Other vitreous opacities, unspecified eye: Secondary | ICD-10-CM | POA: Diagnosis not present

## 2011-08-28 DIAGNOSIS — H5200 Hypermetropia, unspecified eye: Secondary | ICD-10-CM | POA: Diagnosis not present

## 2011-08-31 DIAGNOSIS — M545 Low back pain: Secondary | ICD-10-CM | POA: Diagnosis not present

## 2011-09-15 LAB — FECAL OCCULT BLOOD, GUAIAC: Fecal Occult Blood: NEGATIVE

## 2011-09-23 DIAGNOSIS — Z1212 Encounter for screening for malignant neoplasm of rectum: Secondary | ICD-10-CM | POA: Diagnosis not present

## 2011-10-30 ENCOUNTER — Ambulatory Visit (INDEPENDENT_AMBULATORY_CARE_PROVIDER_SITE_OTHER): Payer: Medicare Other | Admitting: Cardiology

## 2011-10-30 ENCOUNTER — Encounter: Payer: Self-pay | Admitting: Cardiology

## 2011-10-30 VITALS — BP 130/70 | HR 45 | Ht 64.0 in | Wt 143.0 lb

## 2011-10-30 DIAGNOSIS — I421 Obstructive hypertrophic cardiomyopathy: Secondary | ICD-10-CM

## 2011-10-30 DIAGNOSIS — R002 Palpitations: Secondary | ICD-10-CM

## 2011-10-30 DIAGNOSIS — I080 Rheumatic disorders of both mitral and aortic valves: Secondary | ICD-10-CM | POA: Diagnosis not present

## 2011-10-30 DIAGNOSIS — I059 Rheumatic mitral valve disease, unspecified: Secondary | ICD-10-CM

## 2011-10-30 DIAGNOSIS — R0989 Other specified symptoms and signs involving the circulatory and respiratory systems: Secondary | ICD-10-CM

## 2011-10-30 DIAGNOSIS — I10 Essential (primary) hypertension: Secondary | ICD-10-CM | POA: Diagnosis not present

## 2011-10-30 NOTE — Progress Notes (Signed)
HPI The patient presents for yearly followup. Since I last saw her she has had no new cardiovascular complaints. She has had some problems with her vision. She denies any new cardiovascular symptoms. She doesn't exercise routinely but she does some household chores. The patient denies any new symptoms such as chest discomfort, neck or arm discomfort. There has been no new shortness of breath, PND or orthopnea. There have been no reported palpitations, presyncope or syncope.  Allergies  Allergen Reactions  . Diphenhydramine Hcl   . Livalo (Pitavastatin Calcium)     Current Outpatient Prescriptions  Medication Sig Dispense Refill  . ALPRAZolam (XANAX) 0.25 MG tablet Take 0.25 mg by mouth as needed.        Marland Kitchen amLODipine (NORVASC) 2.5 MG tablet Take 2.5 mg by mouth daily.        Marland Kitchen aspirin 81 MG tablet Take 81 mg by mouth daily.        . Calcium Carbonate-Vit D-Min (CALCIUM 1200) 1200-1000 MG-UNIT CHEW Chew 1 Units by mouth daily.        . Cholecalciferol (VITAMIN D3) 1000 UNITS tablet Take 3,000 Units by mouth daily.        . DULoxetine (CYMBALTA) 60 MG capsule Take 60 mg by mouth daily.        Marland Kitchen estradiol (VIVELLE-DOT) 0.05 MG/24HR Place 1 patch onto the skin as directed.        . hydrocodone-acetaminophen (LORCET-HD) 5-500 MG per capsule Take 1 capsule by mouth as needed.        Marland Kitchen lisinopril (PRINIVIL,ZESTRIL) 40 MG tablet Take 40 mg by mouth daily.        . metoprolol (TOPROL-XL) 50 MG 24 hr tablet Take 50 mg by mouth daily.        . Multiple Vitamin (MULTIVITAMIN) capsule Take 1 capsule by mouth daily.        . vitamin C (ASCORBIC ACID) 500 MG tablet Take 500 mg by mouth daily.          Past Medical History  Diagnosis Date  . Hypertrophic cardiomyopathy     by history but not identified on the most recent echo  . Mild mitral valve prolapse   . Mitral insufficiency     Mild  . Hypertension     Past Surgical History  Procedure Date  . Abdominal hysterectomy     ROS:  As  stated in the HPI and negative for all other systems.  PHYSICAL EXAM BP 130/70  Pulse 45  Ht 5\' 4"  (1.626 m)  Wt 143 lb (64.864 kg)  BMI 24.55 kg/m2 GENERAL:  Well appearing HEENT:  Pupils equal round and reactive, fundi not visualized, oral mucosa unremarkable NECK:  No jugular venous distention, waveform within normal limits, right soft carotid bruits, no thyromegaly LYMPHATICS:  No cervical, inguinal adenopathy LUNGS:  Clear to auscultation bilaterally BACK:  No CVA tenderness CHEST:  Unremarkable HEART:  PMI not displaced or sustained,S1 and S2 within normal limits, no S3, no S4, no clicks, no rubs, apical holosystolic murmur 3/6 ABD:  Flat, positive bowel sounds normal in frequency in pitch, no bruits, no rebound, no guarding, no midline pulsatile mass, no hepatomegaly, no splenomegaly EXT:  2 plus pulses throughout, no edema, no cyanosis no clubbing SKIN:  No rashes no nodules NEURO:  Cranial nerves II through XII grossly intact, motor grossly intact throughout PSYCH:  Cognitively intact, oriented to person place and time  EKG:  Sinus rhythm, rate 45,  left axis deviation, first  degree AV block, no significant change.  10/30/2011  ASSESSMENT AND PLAN

## 2011-10-30 NOTE — Patient Instructions (Addendum)

## 2011-10-30 NOTE — Assessment & Plan Note (Signed)
This was moderate on her most recent echo in 2011.  I will follow up with a repeat echo.

## 2011-10-30 NOTE — Assessment & Plan Note (Signed)
The blood pressure is at target. No change in medications is indicated. We will continue with therapeutic lifestyle changes (TLC).  

## 2011-10-30 NOTE — Assessment & Plan Note (Signed)
There was no evidence of this on her previous echo but this will be evaluated as above.

## 2011-10-30 NOTE — Assessment & Plan Note (Signed)
She has less than 50% bilateral stenosis and will have followup in June of next year.

## 2011-11-06 ENCOUNTER — Other Ambulatory Visit (HOSPITAL_COMMUNITY): Payer: Medicare Other

## 2011-11-06 ENCOUNTER — Ambulatory Visit (HOSPITAL_COMMUNITY): Payer: Medicare Other | Attending: Cardiology | Admitting: Radiology

## 2011-11-06 DIAGNOSIS — I5020 Unspecified systolic (congestive) heart failure: Secondary | ICD-10-CM | POA: Diagnosis not present

## 2011-11-06 DIAGNOSIS — I517 Cardiomegaly: Secondary | ICD-10-CM | POA: Insufficient documentation

## 2011-11-06 DIAGNOSIS — I10 Essential (primary) hypertension: Secondary | ICD-10-CM | POA: Diagnosis not present

## 2011-11-06 DIAGNOSIS — I059 Rheumatic mitral valve disease, unspecified: Secondary | ICD-10-CM | POA: Insufficient documentation

## 2011-11-06 DIAGNOSIS — R002 Palpitations: Secondary | ICD-10-CM | POA: Diagnosis not present

## 2011-11-06 DIAGNOSIS — I359 Nonrheumatic aortic valve disorder, unspecified: Secondary | ICD-10-CM | POA: Diagnosis not present

## 2011-11-06 DIAGNOSIS — I509 Heart failure, unspecified: Secondary | ICD-10-CM | POA: Insufficient documentation

## 2011-11-06 DIAGNOSIS — R011 Cardiac murmur, unspecified: Secondary | ICD-10-CM | POA: Insufficient documentation

## 2011-11-06 NOTE — Progress Notes (Signed)
Echocardiogram performed.  

## 2011-11-15 DIAGNOSIS — H04129 Dry eye syndrome of unspecified lacrimal gland: Secondary | ICD-10-CM | POA: Diagnosis not present

## 2011-11-15 DIAGNOSIS — H5000 Unspecified esotropia: Secondary | ICD-10-CM | POA: Diagnosis not present

## 2011-11-15 DIAGNOSIS — D320 Benign neoplasm of cerebral meninges: Secondary | ICD-10-CM | POA: Diagnosis not present

## 2011-12-20 DIAGNOSIS — H5710 Ocular pain, unspecified eye: Secondary | ICD-10-CM | POA: Diagnosis not present

## 2011-12-24 DIAGNOSIS — Z124 Encounter for screening for malignant neoplasm of cervix: Secondary | ICD-10-CM | POA: Diagnosis not present

## 2011-12-24 DIAGNOSIS — R87611 Atypical squamous cells cannot exclude high grade squamous intraepithelial lesion on cytologic smear of cervix (ASC-H): Secondary | ICD-10-CM | POA: Diagnosis not present

## 2011-12-31 DIAGNOSIS — M899 Disorder of bone, unspecified: Secondary | ICD-10-CM | POA: Diagnosis not present

## 2011-12-31 DIAGNOSIS — E785 Hyperlipidemia, unspecified: Secondary | ICD-10-CM | POA: Diagnosis not present

## 2011-12-31 DIAGNOSIS — M25559 Pain in unspecified hip: Secondary | ICD-10-CM | POA: Diagnosis not present

## 2011-12-31 DIAGNOSIS — M949 Disorder of cartilage, unspecified: Secondary | ICD-10-CM | POA: Diagnosis not present

## 2011-12-31 DIAGNOSIS — I10 Essential (primary) hypertension: Secondary | ICD-10-CM | POA: Diagnosis not present

## 2011-12-31 DIAGNOSIS — E559 Vitamin D deficiency, unspecified: Secondary | ICD-10-CM | POA: Diagnosis not present

## 2011-12-31 DIAGNOSIS — M545 Low back pain: Secondary | ICD-10-CM | POA: Diagnosis not present

## 2012-01-03 ENCOUNTER — Encounter: Payer: Self-pay | Admitting: Cardiology

## 2012-01-16 DIAGNOSIS — N362 Urethral caruncle: Secondary | ICD-10-CM | POA: Diagnosis not present

## 2012-01-16 DIAGNOSIS — R300 Dysuria: Secondary | ICD-10-CM | POA: Diagnosis not present

## 2012-01-16 DIAGNOSIS — R310 Gross hematuria: Secondary | ICD-10-CM | POA: Diagnosis not present

## 2012-02-06 DIAGNOSIS — IMO0002 Reserved for concepts with insufficient information to code with codable children: Secondary | ICD-10-CM | POA: Diagnosis not present

## 2012-02-10 DIAGNOSIS — IMO0002 Reserved for concepts with insufficient information to code with codable children: Secondary | ICD-10-CM | POA: Diagnosis not present

## 2012-03-19 DIAGNOSIS — Z23 Encounter for immunization: Secondary | ICD-10-CM | POA: Diagnosis not present

## 2012-04-07 DIAGNOSIS — N952 Postmenopausal atrophic vaginitis: Secondary | ICD-10-CM | POA: Diagnosis not present

## 2012-04-07 DIAGNOSIS — N816 Rectocele: Secondary | ICD-10-CM | POA: Diagnosis not present

## 2012-04-07 DIAGNOSIS — Z9189 Other specified personal risk factors, not elsewhere classified: Secondary | ICD-10-CM | POA: Diagnosis not present

## 2012-04-08 DIAGNOSIS — M949 Disorder of cartilage, unspecified: Secondary | ICD-10-CM | POA: Diagnosis not present

## 2012-04-08 DIAGNOSIS — M899 Disorder of bone, unspecified: Secondary | ICD-10-CM | POA: Diagnosis not present

## 2012-04-16 LAB — LIPID PANEL: Cholesterol: 165 mg/dL (ref 0–200)

## 2012-04-17 DIAGNOSIS — N362 Urethral caruncle: Secondary | ICD-10-CM | POA: Diagnosis not present

## 2012-04-17 DIAGNOSIS — R310 Gross hematuria: Secondary | ICD-10-CM | POA: Diagnosis not present

## 2012-04-17 DIAGNOSIS — N3020 Other chronic cystitis without hematuria: Secondary | ICD-10-CM | POA: Diagnosis not present

## 2012-04-23 ENCOUNTER — Encounter: Payer: Self-pay | Admitting: Cardiology

## 2012-04-23 DIAGNOSIS — E785 Hyperlipidemia, unspecified: Secondary | ICD-10-CM | POA: Diagnosis not present

## 2012-04-23 DIAGNOSIS — R350 Frequency of micturition: Secondary | ICD-10-CM | POA: Diagnosis not present

## 2012-04-23 DIAGNOSIS — E559 Vitamin D deficiency, unspecified: Secondary | ICD-10-CM | POA: Diagnosis not present

## 2012-04-23 DIAGNOSIS — I10 Essential (primary) hypertension: Secondary | ICD-10-CM | POA: Diagnosis not present

## 2012-04-23 DIAGNOSIS — L639 Alopecia areata, unspecified: Secondary | ICD-10-CM | POA: Diagnosis not present

## 2012-04-23 LAB — CBC AND DIFFERENTIAL
Platelets: 195 10*3/uL (ref 150–399)
WBC: 4.5 10^3/mL

## 2012-04-23 LAB — BASIC METABOLIC PANEL: Sodium: 138 mmol/L (ref 137–147)

## 2012-04-24 LAB — LIPID PANEL
HDL: 64 mg/dL (ref 35–70)
LDL Cholesterol: 87 mg/dL

## 2012-05-08 DIAGNOSIS — H5000 Unspecified esotropia: Secondary | ICD-10-CM | POA: Diagnosis not present

## 2012-05-11 ENCOUNTER — Encounter: Payer: Self-pay | Admitting: Cardiology

## 2012-05-15 ENCOUNTER — Encounter: Payer: Self-pay | Admitting: *Deleted

## 2012-05-15 DIAGNOSIS — H532 Diplopia: Secondary | ICD-10-CM | POA: Diagnosis not present

## 2012-05-15 DIAGNOSIS — H2510 Age-related nuclear cataract, unspecified eye: Secondary | ICD-10-CM | POA: Diagnosis not present

## 2012-05-15 DIAGNOSIS — H52229 Regular astigmatism, unspecified eye: Secondary | ICD-10-CM | POA: Diagnosis not present

## 2012-05-15 DIAGNOSIS — H5200 Hypermetropia, unspecified eye: Secondary | ICD-10-CM | POA: Diagnosis not present

## 2012-06-10 ENCOUNTER — Other Ambulatory Visit: Payer: Self-pay | Admitting: Family Medicine

## 2012-06-10 DIAGNOSIS — D329 Benign neoplasm of meninges, unspecified: Secondary | ICD-10-CM

## 2012-06-10 DIAGNOSIS — R7989 Other specified abnormal findings of blood chemistry: Secondary | ICD-10-CM | POA: Diagnosis not present

## 2012-06-16 ENCOUNTER — Other Ambulatory Visit: Payer: Self-pay | Admitting: Family Medicine

## 2012-06-16 DIAGNOSIS — D329 Benign neoplasm of meninges, unspecified: Secondary | ICD-10-CM

## 2012-06-19 ENCOUNTER — Other Ambulatory Visit: Payer: Medicare Other

## 2012-06-25 ENCOUNTER — Other Ambulatory Visit: Payer: Medicare Other

## 2012-06-26 ENCOUNTER — Ambulatory Visit
Admission: RE | Admit: 2012-06-26 | Discharge: 2012-06-26 | Disposition: A | Discharge status: Home / Self Care | Payer: Medicare Other | Source: Ambulatory Visit | Attending: Family Medicine | Admitting: Family Medicine

## 2012-06-26 DIAGNOSIS — D320 Benign neoplasm of cerebral meninges: Secondary | ICD-10-CM | POA: Diagnosis not present

## 2012-06-26 DIAGNOSIS — D329 Benign neoplasm of meninges, unspecified: Secondary | ICD-10-CM

## 2012-06-26 MED ORDER — GADOBENATE DIMEGLUMINE 529 MG/ML IV SOLN
13.0000 mL | Freq: Once | INTRAVENOUS | Status: AC | PRN
  Administered 2012-06-26: 13 mL via INTRAVENOUS

## 2012-06-30 DIAGNOSIS — Z1231 Encounter for screening mammogram for malignant neoplasm of breast: Secondary | ICD-10-CM | POA: Diagnosis not present

## 2012-07-17 DIAGNOSIS — H2510 Age-related nuclear cataract, unspecified eye: Secondary | ICD-10-CM | POA: Diagnosis not present

## 2012-07-30 ENCOUNTER — Telehealth: Payer: Self-pay | Admitting: Family Medicine

## 2012-07-31 ENCOUNTER — Encounter: Payer: Self-pay | Admitting: Nurse Practitioner

## 2012-07-31 ENCOUNTER — Ambulatory Visit (INDEPENDENT_AMBULATORY_CARE_PROVIDER_SITE_OTHER): Payer: Medicare Other | Admitting: Nurse Practitioner

## 2012-07-31 ENCOUNTER — Ambulatory Visit (INDEPENDENT_AMBULATORY_CARE_PROVIDER_SITE_OTHER): Payer: Medicare Other

## 2012-07-31 VITALS — BP 119/54 | HR 57 | Temp 98.2°F | Ht 65.0 in | Wt 142.5 lb

## 2012-07-31 DIAGNOSIS — M25559 Pain in unspecified hip: Secondary | ICD-10-CM

## 2012-07-31 DIAGNOSIS — M25551 Pain in right hip: Secondary | ICD-10-CM

## 2012-07-31 MED ORDER — KETOROLAC TROMETHAMINE 60 MG/2ML IM SOLN
60.0000 mg | Freq: Once | INTRAMUSCULAR | Status: AC
  Administered 2012-07-31: 60 mg via INTRAMUSCULAR

## 2012-07-31 NOTE — Progress Notes (Signed)
  Subjective:    Patient ID: Malachi Paradise, female    DOB: 12-19-1932, 79 y.o.   MRN: [id number]  HPI Patient in complaining of pain Right hip pain. Started 4dayago. intermittent. Rates pain 10/10. Standing still helps pain. Rising from sitting position increases pain. Pain only in hip. Pain does not radiate. Had Flexeril at home and started yesterday and has helped some.     Review of Systems  Respiratory: Negative.   Cardiovascular: Negative.   Musculoskeletal: Negative for myalgias, joint swelling and gait problem.       Objective:   Physical Exam  Constitutional: She appears well-developed and well-nourished.  Cardiovascular: Normal rate, regular rhythm, normal heart sounds and intact distal pulses.   Pulmonary/Chest: Effort normal and breath sounds normal.  Musculoskeletal:       Right hip: She exhibits normal range of motion, normal strength and no tenderness.  Slight pain on extreme abduction   BP 119/54  Pulse 57  Temp(Src) 98.2 F (36.8 C) (Oral)  Ht 5\' 5"  (1.651 m)  Wt 142 lb 8 oz (64.638 kg)  BMI 23.71 kg/m2  LMP 08/01/1982  Hip x-ray- Joint space narrowing otherwise WNL  Preliminary reading by Paulene Floor, FNP  [facility]        Assessment & Plan:  1. Right hip pain Moist heat if helps Rest Flexeril as previously ordered if helps - DG Hip Complete Right; Future - ketorolac (TORADOL) injection 60 mg; Inject 2 mLs (60 mg total) into the muscle once. Mary-Margaret Daphine Deutscher, FNP

## 2012-07-31 NOTE — Patient Instructions (Signed)
Hip Pain  The hips join the upper legs to the lower pelvis. The bones, cartilage, tendons, and muscles of the hip joint perform a lot of work each day holding your body weight and allowing you to move around.  Hip pain is a common symptom. It can range from a minor ache to severe pain on 1 or both hips. Pain may be felt on the inside of the hip joint near the groin, or the outside near the buttocks and upper thigh. There may be swelling or stiffness as well. It occurs more often when a person walks or performs activity. There are many reasons hip pain can develop.  CAUSES   It is important to work with your caregiver to identify the cause since many conditions can impact the bones, cartilage, muscles, and tendons of the hips. Causes for hip pain include:   Broken (fractured) bones.   Separation of the thighbone from the hip socket (dislocation).   Torn cartilage of the hip joint.   Swelling (inflammation) of a tendon (tendonitis), the sac within the hip joint (bursitis), or a joint.   A weakening in the abdominal wall (hernia), affecting the nerves to the hip.   Arthritis in the hip joint or lining of the hip joint.   Pinched nerves in the back, hip, or upper thigh.   A bulging disc in the spine (herniated disc).   Rarely, bone infection or cancer.  DIAGNOSIS   The location of your hip pain will help your caregiver understand what may be causing the pain. A diagnosis is based on your medical history, your symptoms, results from your physical exam, and results from diagnostic tests. Diagnostic tests may include X-ray exams, a computerized magnetic scan (magnetic resonance imaging, MRI), or bone scan.  TREATMENT   Treatment will depend on the cause of your hip pain. Treatment may include:   Limiting activities and resting until symptoms improve.   Crutches or other walking supports (a cane or brace).   Ice, elevation, and compression.   Physical therapy or home exercises.    Shoe inserts or special shoes.   Losing weight.   Medications to reduce pain.   Undergoing surgery.  HOME CARE INSTRUCTIONS    Only take over-the-counter or prescription medicines for pain, discomfort, or fever as directed by your caregiver.   Put ice on the injured area:   Put ice in a plastic bag.   Place a towel between your skin and the bag.   Leave the ice on for 15 to 20 minutes at a time, 3 to 4 times a day.   Keep your leg raised (elevated) when possible to lessen swelling.   Avoid activities that cause pain.   Follow specific exercises as directed by your caregiver.   Sleep with a pillow between your legs on your most comfortable side.   Record how often you have hip pain, the location of the pain, and what it feels like. This information may be helpful to you and your caregiver.   Ask your caregiver about returning to work or sports and whether you should drive.   Follow up with your caregiver for further exams, therapy, or testing as directed.  SEEK MEDICAL CARE IF:    Your pain or swelling continues or worsens after 1 week.   You are feeling unwell or have chills.   You have increasing difficulty with walking.   You have a loss of sensation or other new symptoms.   You have questions   or concerns.  SEEK IMMEDIATE MEDICAL CARE IF:    You cannot put weight on the affected hip.   You have fallen.   You have a sudden increase in pain and swelling in your hip.   You have a fever.  MAKE SURE YOU:    Understand these instructions.   Will watch your condition.   Will get help right away if you are not doing well or get worse.  Document Released: 10/10/2009 Document Revised: 07/15/2011 Document Reviewed: 10/10/2009  [employer] Patient Information 2013 [employer].

## 2012-07-31 NOTE — Telephone Encounter (Signed)
Hip pain and head cold- will work in this am- appt 945 w/ mmm

## 2012-08-05 ENCOUNTER — Ambulatory Visit (INDEPENDENT_AMBULATORY_CARE_PROVIDER_SITE_OTHER): Payer: Medicare Other | Admitting: Family Medicine

## 2012-08-05 ENCOUNTER — Encounter: Payer: Self-pay | Admitting: Family Medicine

## 2012-08-05 VITALS — BP 124/54 | HR 53 | Temp 98.2°F | Ht 65.0 in | Wt 144.0 lb

## 2012-08-05 DIAGNOSIS — E559 Vitamin D deficiency, unspecified: Secondary | ICD-10-CM

## 2012-08-05 DIAGNOSIS — F411 Generalized anxiety disorder: Secondary | ICD-10-CM | POA: Insufficient documentation

## 2012-08-05 DIAGNOSIS — R5381 Other malaise: Secondary | ICD-10-CM | POA: Diagnosis not present

## 2012-08-05 DIAGNOSIS — I10 Essential (primary) hypertension: Secondary | ICD-10-CM | POA: Diagnosis not present

## 2012-08-05 DIAGNOSIS — M1990 Unspecified osteoarthritis, unspecified site: Secondary | ICD-10-CM

## 2012-08-05 DIAGNOSIS — D329 Benign neoplasm of meninges, unspecified: Secondary | ICD-10-CM

## 2012-08-05 DIAGNOSIS — E785 Hyperlipidemia, unspecified: Secondary | ICD-10-CM | POA: Diagnosis not present

## 2012-08-05 DIAGNOSIS — J309 Allergic rhinitis, unspecified: Secondary | ICD-10-CM

## 2012-08-05 DIAGNOSIS — D320 Benign neoplasm of cerebral meninges: Secondary | ICD-10-CM

## 2012-08-05 DIAGNOSIS — L659 Nonscarring hair loss, unspecified: Secondary | ICD-10-CM | POA: Diagnosis not present

## 2012-08-05 LAB — HEPATIC FUNCTION PANEL
ALT: 10 U/L (ref 0–35)
AST: 17 U/L (ref 0–37)
Albumin: 4 g/dL (ref 3.5–5.2)
Alkaline Phosphatase: 46 U/L (ref 39–117)
Indirect Bilirubin: 0.4 mg/dL (ref 0.0–0.9)
Total Protein: 6.8 g/dL (ref 6.0–8.3)

## 2012-08-05 LAB — POCT CBC
Granulocyte percent: 57.8 %G (ref 37–80)
HCT, POC: 36.6 % — AB (ref 37.7–47.9)
MCH, POC: 32 pg — AB (ref 27–31.2)
MCV: 94.6 fL (ref 80–97)
RBC: 3.9 M/uL — AB (ref 4.04–5.48)

## 2012-08-05 LAB — BASIC METABOLIC PANEL WITH GFR
BUN: 15 mg/dL (ref 6–23)
CO2: 28 mEq/L (ref 19–32)
Chloride: 104 mEq/L (ref 96–112)
Creat: 0.62 mg/dL (ref 0.50–1.10)
Glucose, Bld: 85 mg/dL (ref 70–99)
Potassium: 4.6 mEq/L (ref 3.5–5.3)

## 2012-08-05 NOTE — Progress Notes (Signed)
  Subjective:    Patient ID: Patricia Villa, female    DOB: [birth year], 79 y.o.   MRN: [id number]  HPI This patient presents for recheck of multiple medical problems. No one accompanies the patient today.  Patient Active Problem List  Diagnosis  . MITRAL INSUFFICIENCY  . ESSENTIAL HYPERTENSION, BENIGN  . MITRAL VALVE PROLAPSE  . Hypertrophic obstructive cardiomyopathy  . PALPITATIONS  . Bruit  . Generalized anxiety disorder    In addition, see review of systems. Also x-rays and notes from previous visit with provider at [facility] were reviewed. We also reviewed the MRI of the brain for followup of the meningioma. Basically this report indicated stability. A repeat MRI will be done in February of 2015.  The allergies, current medications, past medical history, surgical history, family and social history are reviewed.  Immunizations reviewed.  Health maintenance reviewed.  The following items are outstanding: none      Review of Systems  Constitutional: Negative for fever.  HENT: Positive for congestion (chest) and postnasal drip.   Eyes: Positive for redness (occasional), itching (occasional) and visual disturbance (double vision, seeing Dr Daph[facility]).  Respiratory: Positive for cough (productive).   Cardiovascular: Positive for palpitations (occasional) and leg swelling (L ankle).  Gastrointestinal: Positive for constipation (occasional, takes Miralax).  Genitourinary: Positive for dysuria (occasional, sees Dr Isabel Caprice), urgency and frequency.  Musculoskeletal: Positive for back pain (LBP) and arthralgias (R hip spasms).  Neurological: Positive for dizziness (occasional) and light-headedness (occasional). Negative for headaches.  Psychiatric/Behavioral: The patient is nervous/anxious (some anxiety).        Objective:   Physical Exam BP 124/54  Pulse 53  Temp(Src) 98.2 F (36.8 C) (Oral)  Ht 5\' 5"  (1.651 m)  Wt 144 lb (65.318 kg)  BMI  23.96 kg/m2  LMP 08/01/1982  The patient appeared well nourished and normally developed, alert and oriented to time and place. Speech, behavior and judgement appear normal. Vital signs as documented.  Head exam is unremarkable. No scleral icterus or pallor noted.  Neck is without jugular venous distension, thyromegally, or carotid bruits. Carotid upstrokes are brisk bilaterally. No cervical adenopathy. Lungs are clear anteriorly and posteriorly to auscultation. Normal respiratory effort. Cardiac exam reveals regular rate and rhythm at 60 per minute. First and second heart sounds normal. No murmurs, rubs or gallops.  Abdominal exam reveals normal bowl sounds, no masses, no organomegaly and no aortic enlargement. No inguinal adenopathy. Extremities are nonedematous and both femoral and pedal pulses are normal. Skin without pallor or jaundice.  Warm and dry, without rash. Neurologic exam reveals normal deep tendon reflexes and normal sensation.          Assessment & Plan:  1. Other and unspecified hyperlipidemia - POCT CBC; Standing - Hepatic function panel; Standing - Vitamin D 25 hydroxy; Standing - NMR Lipoprofile with Lipids; Standing  2. Osteoarthritis Regular walking exercise will help her arthritis - POCT CBC; Standing - Vitamin D 25 hydroxy; Standing  3. Unspecified vitamin D deficiency - Vitamin D 25 hydroxy; Standing  4. Essential hypertension, benign Blood pressure under good control - BASIC METABOLIC PANEL WITH GFR; Standing  5. Other malaise and fatigue - Thyroid Panel With TSH  6. Alopecia Not addressed today - Thyroid Panel With TSH  7. Allergic rhinitis Recommended Nasacort over-the-counter and Mucinex  8. Meningioma Yearly MRIs in February

## 2012-08-05 NOTE — Patient Instructions (Addendum)
FOBT given, due in May Fall precautions discussed Continue current meds and therapeutic lifestyle changes Continue walking and exercise for arthritis Try Nasacort AQ over-the-counter one spray each nostril at bedtime Mucinex as needed for cough and congestion MRI of the brain will be due again in February of 2015

## 2012-08-05 NOTE — Addendum Note (Signed)
Addended by: Tommas Olp on: 08/05/2012 11:47 AM   Modules accepted: Orders

## 2012-08-06 LAB — NMR LIPOPROFILE WITH LIPIDS
Cholesterol, Total: 148 mg/dL (ref <200)
HDL Particle Number: 28.9 umol/L — ABNORMAL LOW (ref >=30.5)
LDL (calc): 85 mg/dL (ref <100)
LP-IR Score: <25 (ref <=45)
Large HDL-P: 9.8 umol/L (ref >=4.8)
Large VLDL-P: <0.8 nmol/L (ref <=2.7)
Small LDL Particle Number: 703 nmol/L — ABNORMAL HIGH (ref <=527)

## 2012-08-07 LAB — THYROID PANEL WITH TSH
Free Thyroxine Index: 2.8 (ref 1.0–3.9)
T3 Uptake: 36.3 % (ref 22.5–37.0)
T4, Total: 7.7 ug/dL (ref 5.0–12.5)

## 2012-08-17 ENCOUNTER — Other Ambulatory Visit: Payer: Self-pay | Admitting: *Deleted

## 2012-08-17 MED ORDER — ALPRAZOLAM 0.25 MG PO TABS: ORAL_TABLET | ORAL | Status: DC

## 2012-08-17 NOTE — Telephone Encounter (Signed)
Last seen 12/13.  Last refill 05/20/12. Call in rx. Route to nurse to call in.

## 2012-08-24 ENCOUNTER — Encounter: Payer: Self-pay | Admitting: *Deleted

## 2012-08-24 NOTE — Telephone Encounter (Signed)
Called to pharmacy 

## 2012-08-25 ENCOUNTER — Other Ambulatory Visit: Payer: Self-pay | Admitting: Family Medicine

## 2012-08-25 MED ORDER — CYCLOBENZAPRINE HCL 10 MG PO TABS: 10.0000 mg | ORAL_TABLET | Freq: Three times a day (TID) | ORAL | Status: DC | PRN

## 2012-08-27 ENCOUNTER — Telehealth: Payer: Self-pay | Admitting: Family Medicine

## 2012-08-28 NOTE — Telephone Encounter (Signed)
Rx was taken care of, pt aware

## 2012-09-03 ENCOUNTER — Other Ambulatory Visit (INDEPENDENT_AMBULATORY_CARE_PROVIDER_SITE_OTHER): Payer: Medicare Other

## 2012-09-03 DIAGNOSIS — Z1212 Encounter for screening for malignant neoplasm of rectum: Secondary | ICD-10-CM

## 2012-09-04 DIAGNOSIS — H2510 Age-related nuclear cataract, unspecified eye: Secondary | ICD-10-CM | POA: Diagnosis not present

## 2012-09-04 LAB — FECAL OCCULT BLOOD, IMMUNOCHEMICAL: Fecal Occult Blood: NEGATIVE

## 2012-09-10 DIAGNOSIS — H2589 Other age-related cataract: Secondary | ICD-10-CM | POA: Diagnosis not present

## 2012-09-10 DIAGNOSIS — R011 Cardiac murmur, unspecified: Secondary | ICD-10-CM | POA: Diagnosis not present

## 2012-09-10 DIAGNOSIS — Z79899 Other long term (current) drug therapy: Secondary | ICD-10-CM | POA: Diagnosis not present

## 2012-09-10 DIAGNOSIS — Z86718 Personal history of other venous thrombosis and embolism: Secondary | ICD-10-CM | POA: Diagnosis not present

## 2012-09-10 DIAGNOSIS — I10 Essential (primary) hypertension: Secondary | ICD-10-CM | POA: Diagnosis not present

## 2012-09-11 DIAGNOSIS — H2510 Age-related nuclear cataract, unspecified eye: Secondary | ICD-10-CM | POA: Diagnosis not present

## 2012-09-11 DIAGNOSIS — Z961 Presence of intraocular lens: Secondary | ICD-10-CM | POA: Diagnosis not present

## 2012-09-11 DIAGNOSIS — Z09 Encounter for follow-up examination after completed treatment for conditions other than malignant neoplasm: Secondary | ICD-10-CM | POA: Diagnosis not present

## 2012-09-30 DIAGNOSIS — R011 Cardiac murmur, unspecified: Secondary | ICD-10-CM | POA: Diagnosis not present

## 2012-09-30 DIAGNOSIS — I10 Essential (primary) hypertension: Secondary | ICD-10-CM | POA: Diagnosis not present

## 2012-09-30 DIAGNOSIS — Z86718 Personal history of other venous thrombosis and embolism: Secondary | ICD-10-CM | POA: Diagnosis not present

## 2012-09-30 DIAGNOSIS — H2589 Other age-related cataract: Secondary | ICD-10-CM | POA: Diagnosis not present

## 2012-10-01 DIAGNOSIS — Z09 Encounter for follow-up examination after completed treatment for conditions other than malignant neoplasm: Secondary | ICD-10-CM | POA: Diagnosis not present

## 2012-10-01 DIAGNOSIS — H2510 Age-related nuclear cataract, unspecified eye: Secondary | ICD-10-CM | POA: Diagnosis not present

## 2012-10-08 DIAGNOSIS — H5000 Unspecified esotropia: Secondary | ICD-10-CM | POA: Diagnosis not present

## 2012-10-08 DIAGNOSIS — Z09 Encounter for follow-up examination after completed treatment for conditions other than malignant neoplasm: Secondary | ICD-10-CM | POA: Diagnosis not present

## 2012-10-08 DIAGNOSIS — H2510 Age-related nuclear cataract, unspecified eye: Secondary | ICD-10-CM | POA: Diagnosis not present

## 2012-10-14 ENCOUNTER — Ambulatory Visit (INDEPENDENT_AMBULATORY_CARE_PROVIDER_SITE_OTHER): Payer: Medicare Other | Admitting: Cardiology

## 2012-10-14 ENCOUNTER — Encounter: Payer: Self-pay | Admitting: Cardiology

## 2012-10-14 VITALS — BP 120/60 | HR 50 | Ht 65.0 in | Wt 145.8 lb

## 2012-10-14 DIAGNOSIS — I421 Obstructive hypertrophic cardiomyopathy: Secondary | ICD-10-CM

## 2012-10-14 DIAGNOSIS — I080 Rheumatic disorders of both mitral and aortic valves: Secondary | ICD-10-CM

## 2012-10-14 DIAGNOSIS — I059 Rheumatic mitral valve disease, unspecified: Secondary | ICD-10-CM

## 2012-10-14 DIAGNOSIS — R002 Palpitations: Secondary | ICD-10-CM | POA: Diagnosis not present

## 2012-10-14 DIAGNOSIS — R0989 Other specified symptoms and signs involving the circulatory and respiratory systems: Secondary | ICD-10-CM

## 2012-10-14 NOTE — Progress Notes (Signed)
HPI The patient presents for yearly followup. Since I last saw her she has had no new cardiovascular complaints. She recently had her cataracts removed area and She denies any new cardiovascular symptoms. She doesn't exercise routinely but she does some household chores. The patient denies any new symptoms such as chest discomfort, neck or arm discomfort. There has been no new shortness of breath, PND or orthopnea. There have been no reported palpitations, presyncope or syncope.  Allergies  Allergen Reactions  . Diphenhydramine Hcl   . Livalo (Pitavastatin Calcium)   . Sulfa Antibiotics   . Zocor (Simvastatin)     Current Outpatient Prescriptions  Medication Sig Dispense Refill  . ALPRAZolam (XANAX) 0.25 MG tablet Take one po qd  30 tablet  0  . amLODipine (NORVASC) 2.5 MG tablet Take 2.5 mg by mouth daily.        Marland Kitchen aspirin 81 MG tablet Take 81 mg by mouth daily.        . calcium carbonate 200 MG capsule Take 250 mg by mouth 2 (two) times daily with a meal.      . Cholecalciferol (VITAMIN D3) 1000 UNITS tablet Take 3,000 Units by mouth daily.        . cyclobenzaprine (FLEXERIL) 10 MG tablet Take 1 tablet (10 mg total) by mouth 3 (three) times daily as needed for muscle spasms.  30 tablet  0  . CYMBALTA 60 MG capsule TAKE ONE CAPSULE BY MOUTH ONE TIME DAILY  30 capsule  2  . estradiol (VIVELLE-DOT) 0.05 MG/24HR Place 1 patch onto the skin as directed.        . hydrocodone-acetaminophen (LORCET-HD) 5-500 MG per capsule Take 1 capsule by mouth as needed.        Marland Kitchen lisinopril (PRINIVIL,ZESTRIL) 40 MG tablet Take 40 mg by mouth daily.        . metoprolol (TOPROL-XL) 50 MG 24 hr tablet Take 50 mg by mouth daily.        . pravastatin (PRAVACHOL) 20 MG tablet Take 20 mg by mouth daily.      . vitamin C (ASCORBIC ACID) 500 MG tablet Take 500 mg by mouth daily.         No current facility-administered medications for this visit.    Past Medical History  Diagnosis Date  . Hypertrophic  cardiomyopathy     by history but not identified on the most recent echo  . Mild mitral valve prolapse   . Mitral insufficiency     Mild  . Hypertension   . Meningioma   . Esotropia   . Hyperlipidemia   . Postmenopausal HRT (hormone replacement therapy)   . Arthritis   . Alopecia   . Diverticulosis of colon   . DDD (degenerative disc disease), cervical     Past Surgical History  Procedure Laterality Date  . Abdominal hysterectomy      ROS:  As stated in the HPI and negative for all other systems.  PHYSICAL EXAM BP 120/60  Pulse 50  Ht 5\' 5"  (1.651 m)  Wt 145 lb 12.8 oz (66.134 kg)  BMI 24.26 kg/m2  LMP 08/01/1982 GENERAL:  Well appearing NECK:  No jugular venous distention, waveform within normal limits, right soft carotid bruits, no thyromegaly LUNGS:  Clear to auscultation bilaterally BACK:  No CVA tenderness CHEST:  Unremarkable HEART:  PMI not displaced or sustained,S1 and S2 within normal limits, no S3, no S4, no clicks, no rubs, apical holosystolic murmur 3/6 ABD:  Flat, positive  bowel sounds normal in frequency in pitch, no bruits, no rebound, no guarding, no midline pulsatile mass, no hepatomegaly, no splenomegaly EXT:  2 plus pulses throughout, no edema, no cyanosis no clubbing, varicose veins   EKG:  Sinus rhythm, rate 50,  left axis deviation, first degree AV block, no significant change.  10/14/2012  ASSESSMENT AND PLAN  MITRAL INSUFFICIENCY:  The patient has no new symptoms. She has a followup echo in July. We will follow this clinically.  CAROTID STENOSIS:  She has Doppler scheduled for later this month to followup his mild stenosis.  HTN:  The blood pressure is at target. No change in medications is indicated. We will continue with therapeutic lifestyle changes (TLC).

## 2012-10-14 NOTE — Patient Instructions (Addendum)
The current medical regimen is effective;  continue present plan and medications.  Follow up in 1 year with Dr Hochrein.  You will receive a letter in the mail 2 months before you are due.  Please call us when you receive this letter to schedule your follow up appointment.  

## 2012-10-22 ENCOUNTER — Other Ambulatory Visit: Payer: Self-pay

## 2012-10-22 NOTE — Telephone Encounter (Signed)
Last filled 4/14  Last seen DWM  08/05/12  If approved called in and have nurse notify patient

## 2012-10-23 MED ORDER — ALPRAZOLAM 0.25 MG PO TABS: ORAL_TABLET | ORAL | Status: DC

## 2012-10-26 ENCOUNTER — Ambulatory Visit (INDEPENDENT_AMBULATORY_CARE_PROVIDER_SITE_OTHER): Payer: Medicare Other | Admitting: Family Medicine

## 2012-10-26 ENCOUNTER — Encounter: Payer: Self-pay | Admitting: Family Medicine

## 2012-10-26 DIAGNOSIS — M25551 Pain in right hip: Secondary | ICD-10-CM

## 2012-10-26 DIAGNOSIS — M25559 Pain in unspecified hip: Secondary | ICD-10-CM | POA: Diagnosis not present

## 2012-10-26 NOTE — Patient Instructions (Addendum)
Warm wet compresses Take precautions not to fall

## 2012-10-26 NOTE — Progress Notes (Signed)
  Subjective:    Patient ID: Patricia Villa, female    DOB: 1932/06/26, 79 y.o.   MRN: [id number]  HPI Patient was visiting the office today with her husband. Her husband was the patient . She asked me if I would give her a shot of cortisone into her right hip   Review of Systems     Right hip pain and trouble with walking sitting and standing. This had increased in frequency recently and severiy. Objective:   Physical Exam  Constitutional: She is oriented to person, place, and time. She appears well-developed and well-nourished.  Musculoskeletal:  Tender to palpation in the right superior hip, posteriorly. There was an area of point tenderness. There was some minimal impact on her range of motion of the hip because of the pain  Neurological: She is alert and oriented to person, place, and time.  Skin: Skin is warm and dry. No rash noted. No erythema.  Psychiatric: She has a normal mood and affect. Her behavior is normal. Judgment and thought content normal.          Assessment & Plan:  Right hip arthritis -60 of Depo-Medrol IM  Patient Instructions  Warm wet compresses Take precautions not to fall

## 2012-10-27 ENCOUNTER — Telehealth: Payer: Self-pay | Admitting: Family Medicine

## 2012-10-28 ENCOUNTER — Encounter (INDEPENDENT_AMBULATORY_CARE_PROVIDER_SITE_OTHER): Payer: Medicare Other

## 2012-10-28 DIAGNOSIS — R42 Dizziness and giddiness: Secondary | ICD-10-CM | POA: Diagnosis not present

## 2012-10-28 DIAGNOSIS — I6529 Occlusion and stenosis of unspecified carotid artery: Secondary | ICD-10-CM | POA: Diagnosis not present

## 2012-10-29 NOTE — Telephone Encounter (Signed)
Spoke with Pietro Cassis at [facility] and she stated she received fax signed by dr Christell Constant with an approval for alprazolam

## 2012-10-29 NOTE — Telephone Encounter (Signed)
Please do

## 2012-11-03 NOTE — Telephone Encounter (Signed)
Patient wants MRI of her right hip she said it has been going on way to long

## 2012-11-03 NOTE — Telephone Encounter (Signed)
This is okay to refill with 5 refills

## 2012-11-05 ENCOUNTER — Telehealth: Payer: Self-pay | Admitting: *Deleted

## 2012-11-05 DIAGNOSIS — M25551 Pain in right hip: Secondary | ICD-10-CM

## 2012-11-05 MED ORDER — ALPRAZOLAM 0.25 MG PO TABS: ORAL_TABLET | ORAL | Status: DC

## 2012-11-05 NOTE — Telephone Encounter (Signed)
Get x-ray of the hip and have her see orthopedist here.

## 2012-11-05 NOTE — Telephone Encounter (Signed)
Continues to have right hip and upper leg pain.  She has episodes where she has difficulty lifting her right leg and walking.    She questions whether an MRI would be appropriate.  She received a steroid injection 10/26/12 and it hasn't help.

## 2012-11-05 NOTE — Telephone Encounter (Signed)
Xanax called into [facility] pharmacy and left on their voicemail.  Patient aware.

## 2012-11-05 NOTE — Telephone Encounter (Signed)
Will address hip pain in another note.

## 2012-11-09 ENCOUNTER — Other Ambulatory Visit (HOSPITAL_COMMUNITY): Payer: Self-pay | Admitting: Cardiology

## 2012-11-09 DIAGNOSIS — I059 Rheumatic mitral valve disease, unspecified: Secondary | ICD-10-CM

## 2012-11-09 NOTE — Telephone Encounter (Signed)
Please set up referral 

## 2012-11-10 ENCOUNTER — Ambulatory Visit (HOSPITAL_COMMUNITY): Payer: Medicare Other | Attending: Cardiology | Admitting: Radiology

## 2012-11-10 DIAGNOSIS — R011 Cardiac murmur, unspecified: Secondary | ICD-10-CM | POA: Diagnosis not present

## 2012-11-10 DIAGNOSIS — I359 Nonrheumatic aortic valve disorder, unspecified: Secondary | ICD-10-CM | POA: Diagnosis not present

## 2012-11-10 DIAGNOSIS — I059 Rheumatic mitral valve disease, unspecified: Secondary | ICD-10-CM | POA: Insufficient documentation

## 2012-11-10 NOTE — Progress Notes (Signed)
Echocardiogram performed.  

## 2012-11-12 NOTE — Telephone Encounter (Signed)
Patient reports that hip pain is unchanged.  She is willing to see orthopedic specialist.  She prefers to have x-rays done when she comes for that visit.    Referral placed.  Requesting appt with [facility] here at [facility] on 11/19/12.

## 2012-11-19 ENCOUNTER — Ambulatory Visit (INDEPENDENT_AMBULATORY_CARE_PROVIDER_SITE_OTHER): Payer: Medicare Other

## 2012-11-19 ENCOUNTER — Other Ambulatory Visit: Payer: Self-pay | Admitting: Orthopedic Surgery

## 2012-11-19 DIAGNOSIS — R52 Pain, unspecified: Secondary | ICD-10-CM

## 2012-11-19 DIAGNOSIS — M5137 Other intervertebral disc degeneration, lumbosacral region: Secondary | ICD-10-CM | POA: Diagnosis not present

## 2012-11-24 ENCOUNTER — Telehealth: Payer: Self-pay | Admitting: Cardiology

## 2012-11-24 NOTE — Telephone Encounter (Signed)
Pt is aware of echo and carotid results

## 2012-11-24 NOTE — Telephone Encounter (Signed)
New Prob  Pt is calling regarding her echo results and carotid.

## 2012-11-30 ENCOUNTER — Other Ambulatory Visit: Payer: Self-pay

## 2012-11-30 MED ORDER — DULOXETINE HCL 60 MG PO CPEP: 60.0000 mg | ORAL_CAPSULE | Freq: Every day | ORAL | Status: DC

## 2012-12-10 ENCOUNTER — Encounter: Payer: Self-pay | Admitting: Family Medicine

## 2012-12-10 ENCOUNTER — Ambulatory Visit (INDEPENDENT_AMBULATORY_CARE_PROVIDER_SITE_OTHER): Payer: Medicare Other | Admitting: Family Medicine

## 2012-12-10 VITALS — BP 134/58 | HR 51 | Temp 98.1°F | Ht 65.0 in | Wt 142.0 lb

## 2012-12-10 DIAGNOSIS — E559 Vitamin D deficiency, unspecified: Secondary | ICD-10-CM | POA: Diagnosis not present

## 2012-12-10 DIAGNOSIS — R5383 Other fatigue: Secondary | ICD-10-CM

## 2012-12-10 DIAGNOSIS — M1990 Unspecified osteoarthritis, unspecified site: Secondary | ICD-10-CM | POA: Diagnosis not present

## 2012-12-10 DIAGNOSIS — I10 Essential (primary) hypertension: Secondary | ICD-10-CM | POA: Diagnosis not present

## 2012-12-10 DIAGNOSIS — E785 Hyperlipidemia, unspecified: Secondary | ICD-10-CM | POA: Diagnosis not present

## 2012-12-10 DIAGNOSIS — R002 Palpitations: Secondary | ICD-10-CM

## 2012-12-10 DIAGNOSIS — R5381 Other malaise: Secondary | ICD-10-CM | POA: Diagnosis not present

## 2012-12-10 LAB — POCT CBC
Granulocyte percent: 63.4 %G (ref 37–80)
HCT, POC: 35 % — AB (ref 37.7–47.9)
Hemoglobin: 12.1 g/dL — AB (ref 12.2–16.2)
MCV: 95.1 fL (ref 80–97)
POC Granulocyte: 3.7 (ref 2–6.9)
RBC: 3.7 M/uL — AB (ref 4.04–5.48)
RDW, POC: 13.2 %
WBC: 5.8 10*3/uL (ref 4.6–10.2)

## 2012-12-10 LAB — HEPATIC FUNCTION PANEL
ALT: 20 U/L (ref 0–35)
Alkaline Phosphatase: 36 U/L — ABNORMAL LOW (ref 39–117)
Bilirubin, Direct: 0.1 mg/dL (ref 0.0–0.3)
Indirect Bilirubin: 0.7 mg/dL (ref 0.0–0.9)
Total Protein: 7.1 g/dL (ref 6.0–8.3)

## 2012-12-10 MED ORDER — HYDROCODONE-ACETAMINOPHEN 5-500 MG PO CAPS: 1.0000 | ORAL_CAPSULE | ORAL | Status: DC | PRN

## 2012-12-10 NOTE — Progress Notes (Signed)
  Subjective:    Patient ID: Patricia Villa, female    DOB: Jul 10, 1932, 79 y.o.   MRN: [id number]  HPI Patient comes in today for followup and management of chronic medical problems. These problems include hyperlipidemia, hypertension, anxiety, osteoarthritis, and a history of hormone replacement therapy. Her health maintenance parameters are up-to-date. See also the review of systems.   Review of Systems  Constitutional: Positive for fatigue.  HENT: Positive for ear pain (L ear at times), congestion, sneezing, postnasal drip and sinus pressure (slight). Negative for sore throat.   Eyes: Positive for itching (due to dry eyes). Negative for pain, discharge and redness.  Respiratory: Positive for cough. Negative for choking, chest tightness, shortness of breath and wheezing.   Cardiovascular: Positive for palpitations (occasional). Negative for chest pain and leg swelling.  Gastrointestinal: Positive for constipation (controlled with Miralax). Negative for nausea, vomiting, abdominal pain, diarrhea and blood in stool.  Endocrine: Negative.  Negative for cold intolerance, heat intolerance, polydipsia, polyphagia and polyuria.  Genitourinary: Positive for dysuria (intermitent, burning) and frequency. Negative for hematuria, vaginal bleeding, vaginal discharge and vaginal pain.  Musculoskeletal: Positive for myalgias (cramps lower legs), back pain (intermitent) and arthralgias (R hip, bilateral hands).  Skin: Positive for color change (age spots). Negative for pallor, rash and wound.  Allergic/Immunologic: Positive for environmental allergies.  Neurological: Negative for dizziness, tremors, syncope, weakness, numbness and headaches.  Psychiatric/Behavioral: Positive for decreased concentration (occasional memory issues). Negative for sleep disturbance. The patient is not nervous/anxious.        Objective:   Physical Exam BP 134/58  Pulse 51  Temp(Src) 98.1 F (36.7 C) (Oral)  Ht 5\' 5"   (1.651 m)  Wt 142 lb (64.411 kg)  BMI 23.63 kg/m2  LMP 08/01/1982  The patient appeared well nourished and normally developed for her age, alert and oriented to time and place. Speech, behavior and judgement appear normal. Vital signs as documented.  Head exam is unremarkable. No scleral icterus or pallor noted. She had ear cerumen in the right ear canal otherwise ears nose and throat were normal.  Neck is without jugular venous distension, thyromegally, or carotid bruits. Carotid upstrokes are brisk bilaterally. No cervical adenopathy. Lungs are clear anteriorly and posteriorly to auscultation. Normal respiratory effort. Cardiac exam reveals regular rate and rhythm. The sounds were distant today. First and second heart sounds normal.  No murmurs, rubs or gallops.  Abdominal exam reveals normal bowl sounds, no masses, no organomegaly and no aortic enlargement. No inguinal adenopathy. Extremities are nonedematous and both femoral and pedal pulses are normal. She had prominent varicosities on both feet and ankles. There was some stiffness in her back and pain with arising and attempting to sit on the table. Skin without pallor or jaundice.  Warm and dry, without rash. No worrisome skin lesions on the back Neurologic exam reveals normal deep tendon reflexes and normal sensation.  EKG: Bradycardia at 43 beats per minute         Assessment & Plan:  1. Hypertension  2. Hyperlipemia  3. Vitamin D deficiency  4. Fatigue  5. Osteoarthritis  6. Palpitations -EKG today--- bradycardia at 43 beats per minute  Patient Instructions  Fall precautions discussed Continue current meds and therapeutic lifestyle changes Return to clinic in September or October for Flu shot Try Nasacort AQ over-the-counter one spray each nostril at bedtime Continue saline nose spray Always drink plenty of fluids and stay well hydrated   Nyra Capes MD

## 2012-12-10 NOTE — Patient Instructions (Addendum)
Fall precautions discussed Continue current meds and therapeutic lifestyle changes Return to clinic in September or October for Flu shot Try Nasacort AQ over-the-counter one spray each nostril at bedtime Continue saline nose spray Always drink plenty of fluids and stay well hydrated

## 2012-12-11 LAB — NMR LIPOPROFILE WITH LIPIDS
Cholesterol, Total: 158 mg/dL (ref <200)
HDL Particle Number: 34.7 umol/L (ref >=30.5)
LDL (calc): 80 mg/dL (ref <100)
LDL Particle Number: 971 nmol/L (ref <1000)
LP-IR Score: <25 (ref <=45)
Triglycerides: 75 mg/dL (ref <150)
VLDL Size: 40.1 nm (ref <=46.6)

## 2012-12-11 LAB — BASIC METABOLIC PANEL WITH GFR
BUN: 18 mg/dL (ref 6–23)
Chloride: 99 mEq/L (ref 96–112)
GFR, Est African American: >89 mL/min
GFR, Est Non African American: 87 mL/min
Glucose, Bld: 87 mg/dL (ref 70–99)
Potassium: 4.3 mEq/L (ref 3.5–5.3)
Sodium: 136 mEq/L (ref 135–145)

## 2012-12-16 DIAGNOSIS — Z961 Presence of intraocular lens: Secondary | ICD-10-CM | POA: Diagnosis not present

## 2012-12-16 DIAGNOSIS — H1130 Conjunctival hemorrhage, unspecified eye: Secondary | ICD-10-CM | POA: Diagnosis not present

## 2012-12-24 ENCOUNTER — Telehealth: Payer: Self-pay | Admitting: Family Medicine

## 2012-12-24 NOTE — Telephone Encounter (Signed)
Patient aware.

## 2013-01-01 ENCOUNTER — Telehealth: Payer: Self-pay | Admitting: Cardiology

## 2013-01-01 NOTE — Telephone Encounter (Signed)
Pt having cataracts removed and having bleeding, needs to go off asa for about two weeks per her eye dr , pls call

## 2013-01-01 NOTE — Telephone Encounter (Signed)
Ok to hold - will restart ASAP.

## 2013-01-05 NOTE — Telephone Encounter (Signed)
Patient has held her ASA 81mg  for several days. The bleeding has improved.   She has spoken with her eye surgeon and he believes that she may have a weak blood vessel.  She has seen him 2-3 times postop and he has evaluated the bleeding and wants to f/u in Jan 2015.   She will call him and f/u sooner if bleeding hasn't resolved over the next few days.   She can restart the ASA once the bleeding has resolved.

## 2013-01-06 DIAGNOSIS — Z1212 Encounter for screening for malignant neoplasm of rectum: Secondary | ICD-10-CM | POA: Diagnosis not present

## 2013-01-06 DIAGNOSIS — Z9189 Other specified personal risk factors, not elsewhere classified: Secondary | ICD-10-CM | POA: Diagnosis not present

## 2013-01-06 DIAGNOSIS — Z130 Encounter for screening for diseases of the blood and blood-forming organs and certain disorders involving the immune mechanism: Secondary | ICD-10-CM | POA: Diagnosis not present

## 2013-01-06 DIAGNOSIS — Z124 Encounter for screening for malignant neoplasm of cervix: Secondary | ICD-10-CM | POA: Diagnosis not present

## 2013-01-11 ENCOUNTER — Other Ambulatory Visit: Payer: Self-pay | Admitting: Family Medicine

## 2013-01-19 ENCOUNTER — Encounter (HOSPITAL_COMMUNITY): Payer: Self-pay | Admitting: Emergency Medicine

## 2013-01-19 ENCOUNTER — Inpatient Hospital Stay (HOSPITAL_COMMUNITY)
Admission: EM | Admit: 2013-01-19 | Discharge: 2013-01-22 | DRG: 392 | Disposition: A | Discharge status: Home / Self Care | Payer: Medicare Other | Attending: Internal Medicine | Admitting: Internal Medicine

## 2013-01-19 DIAGNOSIS — R5381 Other malaise: Secondary | ICD-10-CM | POA: Diagnosis present

## 2013-01-19 DIAGNOSIS — K2970 Gastritis, unspecified, without bleeding: Secondary | ICD-10-CM | POA: Diagnosis not present

## 2013-01-19 DIAGNOSIS — Z7982 Long term (current) use of aspirin: Secondary | ICD-10-CM

## 2013-01-19 DIAGNOSIS — Z79890 Hormone replacement therapy (postmenopausal): Secondary | ICD-10-CM

## 2013-01-19 DIAGNOSIS — E860 Dehydration: Secondary | ICD-10-CM | POA: Diagnosis present

## 2013-01-19 DIAGNOSIS — K529 Noninfective gastroenteritis and colitis, unspecified: Secondary | ICD-10-CM

## 2013-01-19 DIAGNOSIS — Z86011 Personal history of benign neoplasm of the brain: Secondary | ICD-10-CM

## 2013-01-19 DIAGNOSIS — Z79899 Other long term (current) drug therapy: Secondary | ICD-10-CM

## 2013-01-19 DIAGNOSIS — F411 Generalized anxiety disorder: Secondary | ICD-10-CM | POA: Diagnosis not present

## 2013-01-19 DIAGNOSIS — D72829 Elevated white blood cell count, unspecified: Secondary | ICD-10-CM | POA: Diagnosis present

## 2013-01-19 DIAGNOSIS — R002 Palpitations: Secondary | ICD-10-CM

## 2013-01-19 DIAGNOSIS — R141 Gas pain: Secondary | ICD-10-CM | POA: Diagnosis not present

## 2013-01-19 DIAGNOSIS — R0989 Other specified symptoms and signs involving the circulatory and respiratory systems: Secondary | ICD-10-CM

## 2013-01-19 DIAGNOSIS — E785 Hyperlipidemia, unspecified: Secondary | ICD-10-CM | POA: Diagnosis present

## 2013-01-19 DIAGNOSIS — I059 Rheumatic mitral valve disease, unspecified: Secondary | ICD-10-CM | POA: Diagnosis present

## 2013-01-19 DIAGNOSIS — K5289 Other specified noninfective gastroenteritis and colitis: Secondary | ICD-10-CM | POA: Diagnosis not present

## 2013-01-19 DIAGNOSIS — I421 Obstructive hypertrophic cardiomyopathy: Secondary | ICD-10-CM | POA: Diagnosis present

## 2013-01-19 DIAGNOSIS — I10 Essential (primary) hypertension: Secondary | ICD-10-CM | POA: Diagnosis present

## 2013-01-19 DIAGNOSIS — M129 Arthropathy, unspecified: Secondary | ICD-10-CM | POA: Diagnosis present

## 2013-01-19 DIAGNOSIS — D329 Benign neoplasm of meninges, unspecified: Secondary | ICD-10-CM

## 2013-01-19 DIAGNOSIS — M5030 Other cervical disc degeneration, unspecified cervical region: Secondary | ICD-10-CM | POA: Diagnosis present

## 2013-01-19 DIAGNOSIS — I080 Rheumatic disorders of both mitral and aortic valves: Secondary | ICD-10-CM

## 2013-01-19 MED ORDER — SODIUM CHLORIDE 0.9 % IV SOLN
INTRAVENOUS | Status: DC
  Administered 2013-01-20: 04:00:00 via INTRAVENOUS

## 2013-01-19 MED ORDER — ONDANSETRON HCL 4 MG/2ML IJ SOLN
4.0000 mg | Freq: Once | INTRAMUSCULAR | Status: AC
  Administered 2013-01-20: 4 mg via INTRAVENOUS
  Filled 2013-01-19: qty 2

## 2013-01-19 NOTE — ED Notes (Signed)
Pt given warm blankets.

## 2013-01-19 NOTE — ED Notes (Signed)
Per ems-- pt from home. Pt reports abdominal pain with nv. Pt abdomen distended. Pt reports BM 20 mins pta. Pt also found to be diaphoretic. Pt given 4mg  zofran with ems. 20G L forearm. Pt sinus brady on ekg.

## 2013-01-20 ENCOUNTER — Emergency Department (HOSPITAL_COMMUNITY): Payer: Medicare Other

## 2013-01-20 DIAGNOSIS — I421 Obstructive hypertrophic cardiomyopathy: Secondary | ICD-10-CM

## 2013-01-20 DIAGNOSIS — K5289 Other specified noninfective gastroenteritis and colitis: Principal | ICD-10-CM

## 2013-01-20 DIAGNOSIS — I10 Essential (primary) hypertension: Secondary | ICD-10-CM

## 2013-01-20 DIAGNOSIS — F411 Generalized anxiety disorder: Secondary | ICD-10-CM

## 2013-01-20 DIAGNOSIS — R141 Gas pain: Secondary | ICD-10-CM | POA: Diagnosis not present

## 2013-01-20 DIAGNOSIS — K529 Noninfective gastroenteritis and colitis, unspecified: Secondary | ICD-10-CM

## 2013-01-20 LAB — COMPREHENSIVE METABOLIC PANEL
ALT: 18 U/L (ref 0–35)
BUN: 30 mg/dL — ABNORMAL HIGH (ref 6–23)
CO2: 27 mEq/L (ref 19–32)
Calcium: 10.1 mg/dL (ref 8.4–10.5)
Creatinine, Ser: 0.82 mg/dL (ref 0.50–1.10)
GFR calc Af Amer: 77 mL/min — ABNORMAL LOW (ref >90)
GFR calc non Af Amer: 67 mL/min — ABNORMAL LOW (ref >90)
Glucose, Bld: 123 mg/dL — ABNORMAL HIGH (ref 70–99)
Sodium: 139 mEq/L (ref 135–145)

## 2013-01-20 LAB — POCT I-STAT TROPONIN I

## 2013-01-20 LAB — CBC WITH DIFFERENTIAL/PLATELET
Eosinophils Relative: 1 % (ref 0–5)
HCT: 38.6 % (ref 36.0–46.0)
Hemoglobin: 13.3 g/dL (ref 12.0–15.0)
Lymphocytes Relative: 8 % — ABNORMAL LOW (ref 12–46)
Lymphs Abs: 1.3 10*3/uL (ref 0.7–4.0)
MCH: 33.3 pg (ref 26.0–34.0)
MCV: 96.5 fL (ref 78.0–100.0)
Monocytes Absolute: 1 10*3/uL (ref 0.1–1.0)
Monocytes Relative: 6 % (ref 3–12)
RBC: 4 MIL/uL (ref 3.87–5.11)
WBC: 15.8 10*3/uL — ABNORMAL HIGH (ref 4.0–10.5)

## 2013-01-20 LAB — URINALYSIS, ROUTINE W REFLEX MICROSCOPIC
Bilirubin Urine: NEGATIVE
Ketones, ur: NEGATIVE mg/dL
Nitrite: NEGATIVE
pH: 6 (ref 5.0–8.0)

## 2013-01-20 LAB — LIPASE, BLOOD: Lipase: 42 U/L (ref 11–59)

## 2013-01-20 LAB — CLOSTRIDIUM DIFFICILE BY PCR: Toxigenic C. Difficile by PCR: NEGATIVE

## 2013-01-20 MED ORDER — ALPRAZOLAM 0.25 MG PO TABS
0.2500 mg | ORAL_TABLET | Freq: Once | ORAL | Status: AC
  Administered 2013-01-20: 22:00:00 0.25 mg via ORAL
  Filled 2013-01-20: qty 1

## 2013-01-20 MED ORDER — ACETAMINOPHEN 325 MG PO TABS: 650.0000 mg | ORAL_TABLET | Freq: Four times a day (QID) | ORAL | Status: DC | PRN

## 2013-01-20 MED ORDER — CIPROFLOXACIN IN D5W 400 MG/200ML IV SOLN
400.0000 mg | Freq: Two times a day (BID) | INTRAVENOUS | Status: DC
  Administered 2013-01-20 – 2013-01-21 (×2): 400 mg via INTRAVENOUS
  Filled 2013-01-20 (×3): qty 200

## 2013-01-20 MED ORDER — ALUM & MAG HYDROXIDE-SIMETH 200-200-20 MG/5ML PO SUSP
30.0000 mL | Freq: Four times a day (QID) | ORAL | Status: DC | PRN
  Administered 2013-01-21: 15:00:00 30 mL via ORAL
  Filled 2013-01-20: qty 30

## 2013-01-20 MED ORDER — MORPHINE SULFATE 4 MG/ML IJ SOLN
4.0000 mg | Freq: Once | INTRAMUSCULAR | Status: AC
  Administered 2013-01-20: 4 mg via INTRAVENOUS
  Filled 2013-01-20: qty 1

## 2013-01-20 MED ORDER — METRONIDAZOLE IN NACL 5-0.79 MG/ML-% IV SOLN
500.0000 mg | Freq: Three times a day (TID) | INTRAVENOUS | Status: DC
  Administered 2013-01-20 – 2013-01-21 (×3): 500 mg via INTRAVENOUS
  Filled 2013-01-20 (×5): qty 100

## 2013-01-20 MED ORDER — ONDANSETRON HCL 4 MG/2ML IJ SOLN: 4.0000 mg | Freq: Four times a day (QID) | INTRAMUSCULAR | Status: DC | PRN

## 2013-01-20 MED ORDER — ONDANSETRON HCL 4 MG PO TABS: 4.0000 mg | ORAL_TABLET | Freq: Four times a day (QID) | ORAL | Status: DC | PRN

## 2013-01-20 MED ORDER — POTASSIUM CHLORIDE CRYS ER 20 MEQ PO TBCR
40.0000 meq | EXTENDED_RELEASE_TABLET | Freq: Four times a day (QID) | ORAL | Status: AC
  Administered 2013-01-20 (×2): 40 meq via ORAL
  Filled 2013-01-20 (×2): qty 2

## 2013-01-20 MED ORDER — IOHEXOL 300 MG/ML  SOLN
100.0000 mL | Freq: Once | INTRAMUSCULAR | Status: AC | PRN
  Administered 2013-01-20: 100 mL via INTRAVENOUS

## 2013-01-20 MED ORDER — HEPARIN SODIUM (PORCINE) 5000 UNIT/ML IJ SOLN
5000.0000 [IU] | Freq: Three times a day (TID) | INTRAMUSCULAR | Status: DC
  Administered 2013-01-20 – 2013-01-22 (×5): 5000 [IU] via SUBCUTANEOUS
  Filled 2013-01-20 (×9): qty 1

## 2013-01-20 MED ORDER — MORPHINE SULFATE 2 MG/ML IJ SOLN
1.0000 mg | INTRAMUSCULAR | Status: DC | PRN
  Filled 2013-01-20: qty 1

## 2013-01-20 MED ORDER — SODIUM CHLORIDE 0.9 % IV SOLN
INTRAVENOUS | Status: AC
  Administered 2013-01-20: 09:00:00 via INTRAVENOUS

## 2013-01-20 MED ORDER — HYDROCODONE-ACETAMINOPHEN 5-325 MG PO TABS
1.0000 | ORAL_TABLET | ORAL | Status: DC | PRN
  Administered 2013-01-20 – 2013-01-21 (×5): 1 via ORAL
  Filled 2013-01-20: qty 2
  Filled 2013-01-20: qty 1
  Filled 2013-01-20: qty 2
  Filled 2013-01-20 (×2): qty 1

## 2013-01-20 MED ORDER — IOHEXOL 300 MG/ML  SOLN
20.0000 mL | INTRAMUSCULAR | Status: DC
  Administered 2013-01-20: 25 mL via ORAL

## 2013-01-20 MED ORDER — SODIUM CHLORIDE 0.9 % IV SOLN
INTRAVENOUS | Status: DC
  Administered 2013-01-20: 22:00:00 via INTRAVENOUS

## 2013-01-20 MED ORDER — ACETAMINOPHEN 650 MG RE SUPP: 650.0000 mg | Freq: Four times a day (QID) | RECTAL | Status: DC | PRN

## 2013-01-20 NOTE — ED Provider Notes (Signed)
CSN: [id number]     Arrival date & time 01/19/13  2230 History   First MD Initiated Contact with Patient 01/19/13 2311     Chief Complaint  Patient presents with  . Weakness  . Abdominal Pain   (Consider location/radiation/quality/duration/timing/severity/associated sxs/prior Treatment) Patient is a 79 y.o. female presenting with weakness and abdominal pain. The history is provided by the patient.  Weakness This is a new problem. Associated symptoms include abdominal pain. Pertinent negatives include no chest pain, no headaches and no shortness of breath.  Abdominal Pain Associated symptoms: nausea and vomiting   Associated symptoms: no chest pain, no diarrhea and no shortness of breath    patient developed acute onset abdominal pain. Worse on the right side. She nausea and generalized weakness. She states she's felt as if her abdomen got larger. She vomited and felt somewhat better. She was diaphoretic. She's had some constipation. No fevers. No chest pain. She states she felt fatigued. She states she had a similar episode before but they did not figure out what it was.  Past Medical History  Diagnosis Date  . Hypertrophic cardiomyopathy     by history but not identified on the most recent echo  . Mild mitral valve prolapse   . Mitral insufficiency     Mild  . Hypertension   . Meningioma   . Esotropia   . Hyperlipidemia   . Postmenopausal HRT (hormone replacement therapy)   . Arthritis   . Alopecia   . Diverticulosis of colon   . DDD (degenerative disc disease), cervical    Past Surgical History  Procedure Laterality Date  . Abdominal hysterectomy    . Cataract extraction, bilateral     Family History  Problem Relation Age of Onset  . Other Brother     IHSS   History  Substance Use Topics  . Smoking status: Never Smoker   . Smokeless tobacco: Not on file     Comment: Rare previously  . Alcohol Use: No   OB History   Grav Para Term Preterm Abortions TAB SAB Ect  Mult Living                 Review of Systems  Constitutional: Positive for diaphoresis. Negative for activity change and appetite change.  HENT: Negative for neck stiffness.   Eyes: Negative for pain.  Respiratory: Negative for chest tightness and shortness of breath.   Cardiovascular: Negative for chest pain and leg swelling.  Gastrointestinal: Positive for nausea, vomiting and abdominal pain. Negative for diarrhea.  Genitourinary: Negative for flank pain.  Musculoskeletal: Negative for back pain.  Skin: Negative for rash.  Neurological: Positive for weakness and light-headedness. Negative for numbness and headaches.  Psychiatric/Behavioral: Negative for behavioral problems.    Allergies  Diphenhydramine hcl; Livalo; Sulfa antibiotics; and Zocor  Home Medications   No current outpatient prescriptions on file. BP 123/44  Pulse 59  Temp(Src) 98.4 F (36.9 C) (Oral)  Resp 20  Ht 5\' 4"  (1.626 m)  Wt 140 lb (63.504 kg)  BMI 24.02 kg/m2  SpO2 95%  LMP 08/01/1982 Physical Exam  Nursing note and vitals reviewed. Constitutional: She is oriented to person, place, and time. She appears well-developed and well-nourished.  HENT:  Head: Normocephalic and atraumatic.  Eyes: EOM are normal. Pupils are equal, round, and reactive to light.  Neck: Normal range of motion. Neck supple.  Cardiovascular: Normal rate, regular rhythm and normal heart sounds.   No murmur heard. Pulmonary/Chest: Effort normal and breath  sounds normal. No respiratory distress. She has no wheezes. She has no rales.  Abdominal: Soft. Bowel sounds are normal. She exhibits distension. There is tenderness. There is no rebound and no guarding.  Right-sided tenderness. No rebound or guarding.  Musculoskeletal: Normal range of motion.  Neurological: She is alert and oriented to person, place, and time. No cranial nerve deficit.  Skin: Skin is warm and dry.  Psychiatric: She has a normal mood and affect. Her speech is  normal.    ED Course  Procedures (including critical care time) Labs Review Labs Reviewed  CBC WITH DIFFERENTIAL - Abnormal; Notable for the following:    WBC 15.8 (*)    Neutrophils Relative % 85 (*)    Neutro Abs 13.4 (*)    Lymphocytes Relative 8 (*)    All other components within normal limits  COMPREHENSIVE METABOLIC PANEL - Abnormal; Notable for the following:    Potassium 3.4 (*)    Glucose, Bld 123 (*)    BUN 30 (*)    GFR calc non Af Amer 67 (*)    GFR calc Af Amer 77 (*)    All other components within normal limits  CLOSTRIDIUM DIFFICILE BY PCR  LIPASE, BLOOD  URINALYSIS, ROUTINE W REFLEX MICROSCOPIC  TSH  GI PATHOGEN PANEL BY PCR, STOOL  BASIC METABOLIC PANEL  CBC  POCT I-STAT TROPONIN I   Imaging Review Ct Abdomen Pelvis W Contrast  01/20/2013   CLINICAL DATA:  Abdominal pain  EXAM: CT ABDOMEN AND PELVIS WITH CONTRAST  TECHNIQUE: Multidetector CT imaging of the abdomen and pelvis was performed using the standard protocol following bolus administration of intravenous contrast.  CONTRAST:  OMNIPAQUE IOHEXOL 300 MG/ML  SOLN  COMPARISON:  01/15/2011.  FINDINGS: BODY WALL: Unremarkable.  LOWER CHEST:  Mediastinum: There is likely thinning of the left ventricular apex, with subendocardial low attenuation, suggesting previous infarct.  Lungs/pleura: No consolidation.  ABDOMEN/PELVIS:  Liver: No focal abnormality.  Biliary: No evidence of biliary obstruction or stone.  Pancreas: Unremarkable.  Spleen: Unremarkable.  Adrenals: Unremarkable.  Kidneys and ureters: No hydronephrosis or stone.  Bladder: Unremarkable.  Bowel: Circumferential thickening due to a submucosal edema, involving the descending colon into the proximal sigmoid colon. The mucosa remains enhancing. There is perinephric fat stranding. No major vessel occlusion. There is likely mild proximal obstruction with fluid levels and distension of the more proximal colon. No dilated small bowel however.   Retroperitoneum: No mass or adenopathy.  Peritoneum: No free fluid or gas.  Reproductive: Hysterectomy.  Vascular: No acute abnormality.  OSSEOUS: Marked dextroscoliosis of the lumbar spine with multiple lateral translations and advanced degenerative disc narrowing.  IMPRESSION: 1. Descending and proximal sigmoid colitis which could be infectious, ischemic (nonocclusive) or inflammatory. 2. Colonic diverticulosis.   Electronically Signed   By: Tiburcio Pea   On: 01/20/2013 03:52   Dg Abd Acute W/chest  01/20/2013   CLINICAL DATA:  Abdominal pain. Diarrhea.  EXAM: ACUTE ABDOMEN SERIES (ABDOMEN 2 VIEW & CHEST 1 VIEW)  COMPARISON:  Abdominal radiograph 11/19/2012. CHEST x-ray 01/21/2010.  FINDINGS: Lung volumes are normal. No consolidative airspace disease. No pleural effusions. No pneumothorax. No pulmonary nodule or mass noted. Pulmonary vasculature and the cardiomediastinal silhouette are within normal limits. Atherosclerosis in the thoracic aorta.  Gas and stool are seen scattered throughout the colon extending to the level of the distal rectum. No pathologic distension of small bowel is noted. No gross evidence of pneumoperitoneum.  S-shaped scoliosis of the thoracolumbar spine  convex to the left of the lower thoracic region and convex to the right in the mid lumbar region incidentally noted.  IMPRESSION: 1.  Nonobstructive bowel gas pattern. 2. No pneumoperitoneum. 3. No radiographic evidence of acute cardiopulmonary disease. 4. S-shaped scoliosis of the thoracolumbar spine.   Electronically Signed   By: Trudie Reed M.D.   On: 01/20/2013 01:26    MDM   1. Colitis   2. Essential hypertension, benign   3. Generalized anxiety disorder   4. Hypertrophic obstructive cardiomyopathy(425.11)    Patient with weakness and abdominal pain. Colitis on CT. Still feels bad. Will admit    Juliet Rude. Rubin Payor, MD 01/20/13 2032

## 2013-01-20 NOTE — H&P (Signed)
[facility] History and Physical  Patricia Villa:[id number] DOB: Jul 11, 1932 DOA: 01/19/2013  Referring physician: Juliet Rude. Rubin Payor, MD PCP: Rudi Heap, MD  Specialists:   Chief Complaint: Abdominal pain  HPI: Patricia Villa is a 79 y.o. female with past medical history of hypertension, meningioma and x2 history of colitis. Patient given to the hospital because of abdominal pain and diarrhea. She said she was in her usual state of health until yesterday morning, towards the evening she did have crampy abdominal pain, especially in the left lower quadrant, so when she started to have diarrhea. She did have 3 watery bowel movements in the emergency department here, she also did have episode of diaphoresis and feeling faint. Initial evaluation in the ED showed leukocytosis of 15.8, mild dehydration with BUN of 30, CT scan of abdomen/pelvis showed descending/sigmoid colitis. Patient admitted to the hospital for further evaluation and management.  Review of Systems:  Constitutional: Had sweats and feeling lightheaded Eyes: negative for irritation, redness and visual disturbance Ears, nose, mouth, throat, and face: negative for earaches, epistaxis, nasal congestion and sore throat Respiratory: negative for cough, dyspnea on exertion, sputum and wheezing Cardiovascular: negative for chest pain, dyspnea, lower extremity edema, orthopnea, palpitations and syncope Gastrointestinal: negative for abdominal pain, constipation, diarrhea, melena, nausea and vomiting Genitourinary:negative for dysuria, frequency and hematuria Hematologic/lymphatic: negative for bleeding, easy bruising and lymphadenopathy Musculoskeletal:negative for arthralgias, muscle weakness and stiff joints Neurological: negative for coordination problems, gait problems, headaches and weakness Endocrine: negative for diabetic symptoms including polydipsia, polyuria and weight loss Allergic/Immunologic: negative for  anaphylaxis, hay fever and urticaria   Past Medical History  Diagnosis Date  . Hypertrophic cardiomyopathy     by history but not identified on the most recent echo  . Mild mitral valve prolapse   . Mitral insufficiency     Mild  . Hypertension   . Meningioma   . Esotropia   . Hyperlipidemia   . Postmenopausal HRT (hormone replacement therapy)   . Arthritis   . Alopecia   . Diverticulosis of colon   . DDD (degenerative disc disease), cervical    Past Surgical History  Procedure Laterality Date  . Abdominal hysterectomy    . Cataract extraction, bilateral     Social History:  reports that she has never smoked. She does not have any smokeless tobacco history on file. She reports that she does not drink alcohol or use illicit drugs.  Allergies  Allergen Reactions  . Diphenhydramine Hcl Hives  . Livalo [Pitavastatin Calcium] Other (See Comments)    Pt doesn't remember reaction  . Sulfa Antibiotics Other (See Comments)    Pt doesn't remember reaction  . Zocor [Simvastatin] Other (See Comments)    Pt doesn't remember reaction   Family History  Problem Relation Age of Onset  . Other Brother     IHSS   Prior to Admission medications   Medication Sig Start Date End Date Taking? Authorizing Provider  acetaminophen (TYLENOL) 500 MG tablet Take 1,000 mg by mouth 2 (two) times daily as needed for pain.   Yes Historical Provider, MD  ALPRAZolam (XANAX) 0.25 MG tablet Take 0.125 mg by mouth at bedtime.   Yes Historical Provider, MD  amLODipine (NORVASC) 2.5 MG tablet Take 2.5 mg by mouth daily.   Yes Historical Provider, MD  Cholecalciferol (VITAMIN D3) 2000 UNITS TABS Take 2,000 Units by mouth daily.   Yes Historical Provider, MD  DULoxetine (CYMBALTA) 60 MG capsule Take 1 capsule (60 mg total)  by mouth daily. 11/30/12  Yes [name], MD  estradiol (VIVELLE-DOT) 0.05 MG/24HR Place 1 patch onto the skin 2 (two) times a week. On Monday and Friday   Yes Historical Provider, MD   HYDROcodone-acetaminophen (NORCO/VICODIN) 5-325 MG per tablet Take 1 tablet by mouth 2 (two) times daily as needed for pain.   Yes Historical Provider, MD  lisinopril (PRINIVIL,ZESTRIL) 40 MG tablet Take 40 mg by mouth daily.    Yes Historical Provider, MD  metoprolol succinate (TOPROL-XL) 50 MG 24 hr tablet Take 50 mg by mouth daily with supper. Take with or immediately following a meal.   Yes Historical Provider, MD  Olopatadine HCl (PATADAY OP) Place 1 drop into both eyes daily as needed (itching).   Yes Historical Provider, MD  Polyethyl Glycol-Propyl Glycol (SYSTANE OP) Place 1 drop into both eyes 2 (two) times daily as needed (itching).   Yes Historical Provider, MD  pravastatin (PRAVACHOL) 20 MG tablet Take 10 mg by mouth at bedtime.    Yes Historical Provider, MD  aspirin EC 81 MG tablet Take 81 mg by mouth daily.    Historical Provider, MD   Physical Exam: Filed Vitals:   01/20/13 0641  BP: 144/66  Pulse: 71  Temp: 98.9 F (37.2 C)  Resp: 18   General appearance: alert, cooperative and no distress  Head: Normocephalic, without obvious abnormality, atraumatic  Eyes: conjunctivae/corneas clear. PERRL, EOM's intact. Fundi benign.  Nose: Nares normal. Septum midline. Mucosa normal. No drainage or sinus tenderness.  Throat: lips, mucosa, and tongue normal; teeth and gums normal  Neck: Supple, no masses, no cervical lymphadenopathy, no JVD appreciated, no meningeal signs Resp: clear to auscultation bilaterally  Chest wall: no tenderness  Cardio: regular rate and rhythm, S1, S2 normal, no murmur, click, rub or gallop  GI: Abdomen is soft, there is moderate-to-severe tenderness in the left lower quadrant. Extremities: extremities normal, atraumatic, no cyanosis or edema  Skin: Skin color, texture, turgor normal. No rashes or lesions  Neurologic: Alert and oriented X 3, normal strength and tone. Normal symmetric reflexes. Normal coordination and gait   Labs on Admission:  Basic  Metabolic Panel:  Recent Labs Lab 01/19/13 2331  NA 139  K 3.4*  CL 101  CO2 27  GLUCOSE 123*  BUN 30*  CREATININE 0.82  CALCIUM 10.1   Liver Function Tests:  Recent Labs Lab 01/19/13 2331  AST 29  ALT 18  ALKPHOS 43  BILITOT 0.4  PROT 7.5  ALBUMIN 4.2    Recent Labs Lab 01/19/13 2331  LIPASE 42   No results found for this basename: AMMONIA,  in the last 168 hours CBC:  Recent Labs Lab 01/19/13 2331  WBC 15.8*  NEUTROABS 13.4*  HGB 13.3  HCT 38.6  MCV 96.5  PLT 203   Cardiac Enzymes: No results found for this basename: CKTOTAL, CKMB, CKMBINDEX, TROPONINI,  in the last 168 hours  BNP (last 3 results) No results found for this basename: PROBNP,  in the last 8760 hours CBG: No results found for this basename: GLUCAP,  in the last 168 hours  Radiological Exams on Admission: Ct Abdomen Pelvis W Contrast  01/20/2013   CLINICAL DATA:  Abdominal pain  EXAM: CT ABDOMEN AND PELVIS WITH CONTRAST  TECHNIQUE: Multidetector CT imaging of the abdomen and pelvis was performed using the standard protocol following bolus administration of intravenous contrast.  CONTRAST:  OMNIPAQUE IOHEXOL 300 MG/ML  SOLN  COMPARISON:  01/15/2011.  FINDINGS: BODY WALL: Unremarkable.  LOWER CHEST:  Mediastinum: There is likely thinning of the left ventricular apex, with subendocardial low attenuation, suggesting previous infarct.  Lungs/pleura: No consolidation.  ABDOMEN/PELVIS:  Liver: No focal abnormality.  Biliary: No evidence of biliary obstruction or stone.  Pancreas: Unremarkable.  Spleen: Unremarkable.  Adrenals: Unremarkable.  Kidneys and ureters: No hydronephrosis or stone.  Bladder: Unremarkable.  Bowel: Circumferential thickening due to a submucosal edema, involving the descending colon into the proximal sigmoid colon. The mucosa remains enhancing. There is perinephric fat stranding. No major vessel occlusion. There is likely mild proximal obstruction with fluid levels and  distension of the more proximal colon. No dilated small bowel however.  Retroperitoneum: No mass or adenopathy.  Peritoneum: No free fluid or gas.  Reproductive: Hysterectomy.  Vascular: No acute abnormality.  OSSEOUS: Marked dextroscoliosis of the lumbar spine with multiple lateral translations and advanced degenerative disc narrowing.  IMPRESSION: 1. Descending and proximal sigmoid colitis which could be infectious, ischemic (nonocclusive) or inflammatory. 2. Colonic diverticulosis.   Electronically Signed   By: Tiburcio Pea   On: 01/20/2013 03:52   Dg Abd Acute W/chest  01/20/2013   CLINICAL DATA:  Abdominal pain. Diarrhea.  EXAM: ACUTE ABDOMEN SERIES (ABDOMEN 2 VIEW & CHEST 1 VIEW)  COMPARISON:  Abdominal radiograph 11/19/2012. CHEST x-ray 01/21/2010.  FINDINGS: Lung volumes are normal. No consolidative airspace disease. No pleural effusions. No pneumothorax. No pulmonary nodule or mass noted. Pulmonary vasculature and the cardiomediastinal silhouette are within normal limits. Atherosclerosis in the thoracic aorta.  Gas and stool are seen scattered throughout the colon extending to the level of the distal rectum. No pathologic distension of small bowel is noted. No gross evidence of pneumoperitoneum.  S-shaped scoliosis of the thoracolumbar spine convex to the left of the lower thoracic region and convex to the right in the mid lumbar region incidentally noted.  IMPRESSION: 1.  Nonobstructive bowel gas pattern. 2. No pneumoperitoneum. 3. No radiographic evidence of acute cardiopulmonary disease. 4. S-shaped scoliosis of the thoracolumbar spine.   Electronically Signed   By: Trudie Reed M.D.   On: 01/20/2013 01:26    EKG: Independently reviewed.   Assessment/Plan Principal Problem:   Colitis Active Problems:   Essential hypertension, benign   Hypertrophic obstructive cardiomyopathy(425.11)   Generalized anxiety disorder   Colitis -Patient presented to the hospital with abdominal pain,  diarrhea CT evidence of colitis. -Per CT there is no diverticulitis, sigmoid and descending colitis. -Check C. difficile PCR and GI pathogens panel. -Patient will be on clear liquids, because of leukocytosis started on IV Cipro and IV Flagyl. -Control pain with pain medications, advance diet as tolerated likely in the morning.  Benign essential hypertension -Continue preadmission medications.  HOCM -Patient is on beta blocker, continue. -Avoid low volume status, started on IV fluids.  GAD -Continue preadmission medications.  Code Status: Full code Family Communication: Plan discussed with the patient with the presence of her son at bedside. Disposition Plan: Inpatient, anticipate length of stay to be greater than 2 midnights.  Time spent: 70 minutes  [facility] Pager [phone]  If 7PM-7AM, please contact night-coverage [email or web address] Password [email or web address] 01/20/2013, 11:03 AM

## 2013-01-20 NOTE — Progress Notes (Signed)
Pt admitted to the unit. Pt is alert and oriented. Pt oriented to room, staff, and call bell. Bed in lowest position. Admitting MD paged. Call bell with in reach. Told to call for assists. Will continue to monitor.  Srinidhi Landers E

## 2013-01-20 NOTE — ED Notes (Signed)
Pt reports relief after using bathroom

## 2013-01-21 DIAGNOSIS — K5289 Other specified noninfective gastroenteritis and colitis: Secondary | ICD-10-CM | POA: Diagnosis not present

## 2013-01-21 DIAGNOSIS — I10 Essential (primary) hypertension: Secondary | ICD-10-CM | POA: Diagnosis not present

## 2013-01-21 DIAGNOSIS — R002 Palpitations: Secondary | ICD-10-CM | POA: Diagnosis not present

## 2013-01-21 DIAGNOSIS — I421 Obstructive hypertrophic cardiomyopathy: Secondary | ICD-10-CM | POA: Diagnosis not present

## 2013-01-21 LAB — GI PATHOGEN PANEL BY PCR, STOOL
Cryptosporidium by PCR: NEGATIVE
E coli (ETEC) LT/ST: NEGATIVE
Norovirus GI/GII: NEGATIVE
Shigella by PCR: NEGATIVE

## 2013-01-21 LAB — CBC
MCH: 32.3 pg (ref 26.0–34.0)
MCHC: 33.3 g/dL (ref 30.0–36.0)
MCV: 97 fL (ref 78.0–100.0)
Platelets: 166 10*3/uL (ref 150–400)
RDW: 13.4 % (ref 11.5–15.5)
WBC: 10.9 10*3/uL — ABNORMAL HIGH (ref 4.0–10.5)

## 2013-01-21 LAB — BASIC METABOLIC PANEL
CO2: 25 mEq/L (ref 19–32)
Calcium: 8.6 mg/dL (ref 8.4–10.5)
Creatinine, Ser: 0.62 mg/dL (ref 0.50–1.10)

## 2013-01-21 LAB — TSH: TSH: 0.965 u[IU]/mL (ref 0.350–4.500)

## 2013-01-21 MED ORDER — CIPROFLOXACIN HCL 500 MG PO TABS
500.0000 mg | ORAL_TABLET | Freq: Two times a day (BID) | ORAL | Status: DC
  Administered 2013-01-21 – 2013-01-22 (×2): 500 mg via ORAL
  Filled 2013-01-21 (×4): qty 1

## 2013-01-21 MED ORDER — ALPRAZOLAM 0.25 MG PO TABS
0.2500 mg | ORAL_TABLET | Freq: Every evening | ORAL | Status: DC | PRN
  Administered 2013-01-21: 0.25 mg via ORAL
  Filled 2013-01-21: qty 1

## 2013-01-21 NOTE — Progress Notes (Signed)
Addendum  Patient seen and examined, chart and data base reviewed.  I agree with the above assessment and plan.  For full details please see Mrs. Algis Downs PA note.  Colitis, now on Cipro. Advance diet as tolerated.   Clint Lipps, MD [facility] Pager: [phone] 01/21/2013, 2:09 PM

## 2013-01-21 NOTE — Progress Notes (Signed)
[facility] PROGRESS NOTE  Patricia Villa ZOX:[id number] DOB: 01/02/1933 DOA: 01/19/2013 PCP: Rudi Heap, MD  Assessment/Plan: Principal Problem:  Colitis  Active Problems:  Essential hypertension, benign  Hypertrophic obstructive cardiomyopathy(425.11)  Generalized anxiety disorder   Colitis  -Patient presented to the hospital with abdominal pain, diarrhea  -Per CT there is no diverticulitis, sigmoid and descending colitis.  -C. difficile PCR negative -GI Pathogen panel pending. -Advance diet to full liquids.  -antibiotics narrowed to Cipro oral -Control pain with pain medications prn -Should follow with Dr. Kinnie Scales outpatient for colonoscopy when healed.  Benign essential hypertension  -Continue preadmission medications.   HOCM  -Patient is on beta blocker, continue.  -Avoid low volume status, started on IV fluids.   GAD  -Continue preadmission medications.   DVT Prophylaxis:  heparin  Code Status: full Family Communication:  Disposition Plan: inpatient.  Hopeful for DC tomorrow (9/19) if she tolerates diet.   Antibiotics:  IV Cipro and Flagyl started on admission  Both discontinued. PO Cipro started 9/18  HPI/Subjective: No stools since just after admission.  Tolerating clears.  Not painful at rest.  Objective: Filed Vitals:   01/20/13 1538 01/20/13 2022 01/21/13 0509 01/21/13 0800  BP: 138/60 123/44 125/54 133/55  Pulse: 68 59 56 75  Temp: 97.4 F (36.3 C) 98.4 F (36.9 C) 98.7 F (37.1 C) 98 F (36.7 C)  TempSrc: Oral Oral Oral Oral  Resp: 18 20 18 18   Height:      Weight:      SpO2: 94% 95% 94% 96%    Intake/Output Summary (Last 24 hours) at 01/21/13 1237 Last data filed at 01/21/13 8119  Gross per 24 hour  Intake 3433.33 ml  Output    900 ml  Net 2533.33 ml   Filed Weights   01/20/13 0825  Weight: 63.504 kg (140 lb)    Exam:   General:  A&O, NAD, Lying comfortably in bed  Cardiovascular: RRR, no murmurs, rubs or  gallops, no lower extremity edema  Respiratory: CTA, no wheeze, crackles, or rales.  No increased work of breathing.  Abdomen: Soft, tender in the lower quadrants bilaterally, non-distended, + bowel sounds, no masses  Musculoskeletal: Able to move all 4 extremities, 5/5 strength in each  Data Reviewed: Basic Metabolic Panel:  Recent Labs Lab 01/19/13 2331 01/21/13 0512  NA 139 136  K 3.4* 3.7  CL 101 102  CO2 27 25  GLUCOSE 123* 106*  BUN 30* 9  CREATININE 0.82 0.62  CALCIUM 10.1 8.6   Liver Function Tests:  Recent Labs Lab 01/19/13 2331  AST 29  ALT 18  ALKPHOS 43  BILITOT 0.4  PROT 7.5  ALBUMIN 4.2    Recent Labs Lab 01/19/13 2331  LIPASE 42   CBC:  Recent Labs Lab 01/19/13 2331 01/21/13 0512  WBC 15.8* 10.9*  NEUTROABS 13.4*  --   HGB 13.3 10.8*  HCT 38.6 32.4*  MCV 96.5 97.0  PLT 203 166     Recent Results (from the past 240 hour(s))  CLOSTRIDIUM DIFFICILE BY PCR     Status: None   Collection Time    01/20/13  5:49 PM      Result Value Range Status   C difficile by pcr NEGATIVE  NEGATIVE Final     Studies: Ct Abdomen Pelvis W Contrast  01/20/2013   CLINICAL DATA:  Abdominal pain  EXAM: CT ABDOMEN AND PELVIS WITH CONTRAST  TECHNIQUE: Multidetector CT imaging of the abdomen and pelvis was performed using  the standard protocol following bolus administration of intravenous contrast.  CONTRAST:  OMNIPAQUE IOHEXOL 300 MG/ML  SOLN  COMPARISON:  01/15/2011.  FINDINGS: BODY WALL: Unremarkable.  LOWER CHEST:  Mediastinum: There is likely thinning of the left ventricular apex, with subendocardial low attenuation, suggesting previous infarct.  Lungs/pleura: No consolidation.  ABDOMEN/PELVIS:  Liver: No focal abnormality.  Biliary: No evidence of biliary obstruction or stone.  Pancreas: Unremarkable.  Spleen: Unremarkable.  Adrenals: Unremarkable.  Kidneys and ureters: No hydronephrosis or stone.  Bladder: Unremarkable.  Bowel: Circumferential  thickening due to a submucosal edema, involving the descending colon into the proximal sigmoid colon. The mucosa remains enhancing. There is perinephric fat stranding. No major vessel occlusion. There is likely mild proximal obstruction with fluid levels and distension of the more proximal colon. No dilated small bowel however.  Retroperitoneum: No mass or adenopathy.  Peritoneum: No free fluid or gas.  Reproductive: Hysterectomy.  Vascular: No acute abnormality.  OSSEOUS: Marked dextroscoliosis of the lumbar spine with multiple lateral translations and advanced degenerative disc narrowing.  IMPRESSION: 1. Descending and proximal sigmoid colitis which could be infectious, ischemic (nonocclusive) or inflammatory. 2. Colonic diverticulosis.   Electronically Signed   By: Tiburcio Pea   On: 01/20/2013 03:52   Dg Abd Acute W/chest  01/20/2013   CLINICAL DATA:  Abdominal pain. Diarrhea.  EXAM: ACUTE ABDOMEN SERIES (ABDOMEN 2 VIEW & CHEST 1 VIEW)  COMPARISON:  Abdominal radiograph 11/19/2012. CHEST x-ray 01/21/2010.  FINDINGS: Lung volumes are normal. No consolidative airspace disease. No pleural effusions. No pneumothorax. No pulmonary nodule or mass noted. Pulmonary vasculature and the cardiomediastinal silhouette are within normal limits. Atherosclerosis in the thoracic aorta.  Gas and stool are seen scattered throughout the colon extending to the level of the distal rectum. No pathologic distension of small bowel is noted. No gross evidence of pneumoperitoneum.  S-shaped scoliosis of the thoracolumbar spine convex to the left of the lower thoracic region and convex to the right in the mid lumbar region incidentally noted.  IMPRESSION: 1.  Nonobstructive bowel gas pattern. 2. No pneumoperitoneum. 3. No radiographic evidence of acute cardiopulmonary disease. 4. S-shaped scoliosis of the thoracolumbar spine.   Electronically Signed   By: Trudie Reed M.D.   On: 01/20/2013 01:26    Scheduled Meds: .  ciprofloxacin  500 mg Oral BID  . heparin  5,000 Units Subcutaneous Q8H   Continuous Infusions:   Principal Problem:   Colitis Active Problems:   Essential hypertension, benign   Hypertrophic obstructive cardiomyopathy(425.11)   Generalized anxiety disorder    Conley Canal  [facility] Pager [phone]. If 7PM-7AM, please contact night-coverage at [email or web address], password [facility] 01/21/2013, 12:37 PM  LOS: 2 days

## 2013-01-22 DIAGNOSIS — I10 Essential (primary) hypertension: Secondary | ICD-10-CM | POA: Diagnosis not present

## 2013-01-22 DIAGNOSIS — F411 Generalized anxiety disorder: Secondary | ICD-10-CM | POA: Diagnosis not present

## 2013-01-22 DIAGNOSIS — K5289 Other specified noninfective gastroenteritis and colitis: Secondary | ICD-10-CM | POA: Diagnosis not present

## 2013-01-22 DIAGNOSIS — I421 Obstructive hypertrophic cardiomyopathy: Secondary | ICD-10-CM | POA: Diagnosis not present

## 2013-01-22 LAB — CBC
Hemoglobin: 11.1 g/dL — ABNORMAL LOW (ref 12.0–15.0)
MCHC: 33.7 g/dL (ref 30.0–36.0)
Platelets: 166 10*3/uL (ref 150–400)
RDW: 13.4 % (ref 11.5–15.5)

## 2013-01-22 MED ORDER — METOPROLOL SUCCINATE ER 50 MG PO TB24
50.0000 mg | ORAL_TABLET | Freq: Every day | ORAL | Status: DC
  Filled 2013-01-22: qty 1

## 2013-01-22 MED ORDER — LISINOPRIL 40 MG PO TABS: 40.0000 mg | ORAL_TABLET | Freq: Every day | ORAL | Status: DC

## 2013-01-22 MED ORDER — CIPROFLOXACIN HCL 500 MG PO TABS: 500.0000 mg | ORAL_TABLET | Freq: Two times a day (BID) | ORAL | Status: DC

## 2013-01-22 NOTE — Progress Notes (Signed)
Patient discharge teaching given, including activity, diet, follow-up appoints, and medications. Patient verbalized understanding of all discharge instructions. IV access was d/c'd. Vitals are stable. Skin is intact except as charted in most recent assessments. Pt to be escorted out by NT, to be driven home by family. 

## 2013-01-22 NOTE — Care Management Note (Signed)
    Page 1 of 1   01/22/2013     4:13:33 PM   CARE MANAGEMENT NOTE 01/22/2013  Patient:  Patricia Villa, Patricia Villa   Account Number:  [id number]  Date Initiated:  01/22/2013  Documentation initiated by:  Letha Cape  Subjective/Objective Assessment:   dx colitis  admit- lives with spouse, pta.     Action/Plan:   Anticipated DC Date:  01/22/2013   Anticipated DC Plan:  HOME/SELF CARE      DC Planning Services  CM consult      Choice offered to / List presented to:             Status of service:  Completed, signed off Medicare Important Message given?   (If response is "NO", the following Medicare IM given date fields will be blank) Date Medicare IM given:   Date Additional Medicare IM given:    Discharge Disposition:  HOME/SELF CARE  Per UR Regulation:  Reviewed for med. necessity/level of care/duration of stay  If discussed at Long Length of Stay Meetings, dates discussed:    Comments:  01/22/13 16:11 Letha Cape RN, BSN [phone] patient lives with spouse, pta indep. No needs anticipated.

## 2013-01-22 NOTE — Discharge Summary (Signed)
Physician Discharge Summary  SIMI BRIEL WJX:[id number] DOB: [birth year] DOA: 01/19/2013  PCP: Rudi Heap, MD  Admit date: 01/19/2013 Discharge date: 01/22/2013  Time spent: 45 minutes  Recommendations for Outpatient Follow-up:  Follow up with PCP in 1 -2 weeks Follow up with Dr. Kinnie Scales in 4 weeks for hospital follow up and to schedule colonoscopy.   Discharge Diagnoses:  Principal Problem:   Colitis Active Problems:   Essential hypertension, benign   Hypertrophic obstructive cardiomyopathy(425.11)   Generalized anxiety disorder   Discharge Condition: stable, improved.  Diet recommendation: soft foods until abdominal pain resolves  Filed Weights   01/20/13 0825 01/21/13 2101  Weight: 63.504 kg (140 lb) 65.091 kg (143 lb 8 oz)    History of present illness:  Patricia Villa is a 79 y.o. female with past medical history of hypertension, meningioma and x 2, and history of colitis. Patient came in to the hospital because of abdominal pain and diarrhea. She said she was in her usual state of health until yesterday when she had crampy abdominal pain, especially in the left lower quadrant, and diarrhea. She did have 3 watery bowel movements in the emergency department, she also had diaphoresis and presyncope.  Initial evaluation in the ED showed leukocytosis of 15.8, mild dehydration with BUN of 30, CT scan of abdomen/pelvis showed descending/sigmoid colitis. Patient admitted to the hospital for further evaluation and management.   Hospital Course:   Colitis  -Patient presented to the hospital with abdominal pain, diarrhea  -CT: Descending and proximal sigmoid colitis which could be  infectious, ischemic (nonocclusive) or inflammatory. -C. difficile PCR negative  -GI Pathogen panel negative -diet advanced to full liquids and then soft solids.  -antibiotics narrowed from cipro/flagyl  to cipro.  -Control pain with pain medications prn  -Should follow with Dr. Kinnie Scales  outpatient for colonoscopy when healed.    Benign essential hypertension  -resume metoprolol.  Will resume lisinopril in a few days (9/22).  HOCM  -Patient is on beta blocker, continue.   General Anxiety Disorder -Continue cymbalta and xanax    Discharge Exam: Filed Vitals:   01/22/13 0618  BP: 149/75  Pulse: 76  Temp: 99.4 F (37.4 C)  Resp: 18   General: A&O, NAD, Lying comfortably in bed  Cardiovascular: RRR, no murmurs, rubs or gallops, no lower extremity edema Respiratory: CTA, no wheeze, crackles, or rales. No increased work of breathing.  Abdomen: Soft, still tender in the lower quadrants bilaterally on palpation but less so than 9/18, non-distended, + bowel sounds, no masses  Musculoskeletal: Able to move all 4 extremities, 5/5 strength in each   Discharge Instructions      Discharge Orders   Future Appointments Provider Department Dept Phone   05/17/2013 10:00 AM [name], MD [facility] [phone]   Future Orders Complete By Expires   Diet general  As directed    Comments:     Soft diet until abdominal pain improved.   Increase activity slowly  As directed        Medication List         acetaminophen 500 MG tablet  Commonly known as:  TYLENOL  Take 1,000 mg by mouth 2 (two) times daily as needed for pain.     ALPRAZolam 0.25 MG tablet  Commonly known as:  XANAX  Take 0.125 mg by mouth at bedtime.     amLODipine 2.5 MG tablet  Commonly known as:  NORVASC  Take 2.5 mg by mouth  daily.     aspirin EC 81 MG tablet  Take 81 mg by mouth daily.     ciprofloxacin 500 MG tablet  Commonly known as:  CIPRO  Take 1 tablet (500 mg total) by mouth 2 (two) times daily.     DULoxetine 60 MG capsule  Commonly known as:  CYMBALTA  Take 1 capsule (60 mg total) by mouth daily.     estradiol 0.05 MG/24HR patch  Commonly known as:  VIVELLE-DOT  Place 1 patch onto the skin 2 (two) times a week. On Monday and Friday      HYDROcodone-acetaminophen 5-325 MG per tablet  Commonly known as:  NORCO/VICODIN  Take 1 tablet by mouth 2 (two) times daily as needed for pain.     lisinopril 40 MG tablet  Commonly known as:  PRINIVIL,ZESTRIL  Take 1 tablet (40 mg total) by mouth daily.     metoprolol succinate 50 MG 24 hr tablet  Commonly known as:  TOPROL-XL  Take 50 mg by mouth daily with supper. Take with or immediately following a meal.     PATADAY OP  Place 1 drop into both eyes daily as needed (itching).     pravastatin 20 MG tablet  Commonly known as:  PRAVACHOL  Take 10 mg by mouth at bedtime.     SYSTANE OP  Place 1 drop into both eyes 2 (two) times daily as needed (itching).     Vitamin D3 2000 UNITS Tabs  Take 2,000 Units by mouth daily.       Allergies  Allergen Reactions  . Diphenhydramine Hcl Hives  . Livalo [Pitavastatin Calcium] Other (See Comments)    Pt doesn't remember reaction  . Sulfa Antibiotics Other (See Comments)    Pt doesn't remember reaction  . Zocor [Simvastatin] Other (See Comments)    Pt doesn't remember reaction   Follow-up Information   Follow up with Rudi Heap, MD. Schedule an appointment as soon as possible for a visit in 2 weeks.   Specialty:  Family Medicine   Contact information:   [address] [place] [address] [phone]       Follow up with MEDOFF,JEFFREY R, MD. Schedule an appointment as soon as possible for a visit in 4 weeks. (for hospital follow up and colonoscopy)    Specialty:  Gastroenterology   Contact information:   [facility] [place] [address] [phone]        The results of significant diagnostics from this hospitalization (including imaging, microbiology, ancillary and laboratory) are listed below for reference.    Significant Diagnostic Studies: Ct Abdomen Pelvis W Contrast  01/20/2013   CLINICAL DATA:  Abdominal pain  EXAM: CT ABDOMEN AND PELVIS WITH CONTRAST  TECHNIQUE: Multidetector CT imaging of  the abdomen and pelvis was performed using the standard protocol following bolus administration of intravenous contrast.  CONTRAST:  OMNIPAQUE IOHEXOL 300 MG/ML  SOLN  COMPARISON:  01/15/2011.  FINDINGS: BODY WALL: Unremarkable.  LOWER CHEST:  Mediastinum: There is likely thinning of the left ventricular apex, with subendocardial low attenuation, suggesting previous infarct.  Lungs/pleura: No consolidation.  ABDOMEN/PELVIS:  Liver: No focal abnormality.  Biliary: No evidence of biliary obstruction or stone.  Pancreas: Unremarkable.  Spleen: Unremarkable.  Adrenals: Unremarkable.  Kidneys and ureters: No hydronephrosis or stone.  Bladder: Unremarkable.  Bowel: Circumferential thickening due to a submucosal edema, involving the descending colon into the proximal sigmoid colon. The mucosa remains enhancing. There is perinephric fat stranding. No major  vessel occlusion. There is likely mild proximal obstruction with fluid levels and distension of the more proximal colon. No dilated small bowel however.  Retroperitoneum: No mass or adenopathy.  Peritoneum: No free fluid or gas.  Reproductive: Hysterectomy.  Vascular: No acute abnormality.  OSSEOUS: Marked dextroscoliosis of the lumbar spine with multiple lateral translations and advanced degenerative disc narrowing.  IMPRESSION: 1. Descending and proximal sigmoid colitis which could be infectious, ischemic (nonocclusive) or inflammatory. 2. Colonic diverticulosis.   Electronically Signed   By: Tiburcio Pea   On: 01/20/2013 03:52   Dg Abd Acute W/chest  01/20/2013   CLINICAL DATA:  Abdominal pain. Diarrhea.  EXAM: ACUTE ABDOMEN SERIES (ABDOMEN 2 VIEW & CHEST 1 VIEW)  COMPARISON:  Abdominal radiograph 11/19/2012. CHEST x-ray 01/21/2010.  FINDINGS: Lung volumes are normal. No consolidative airspace disease. No pleural effusions. No pneumothorax. No pulmonary nodule or mass noted. Pulmonary vasculature and the cardiomediastinal silhouette are within normal  limits. Atherosclerosis in the thoracic aorta.  Gas and stool are seen scattered throughout the colon extending to the level of the distal rectum. No pathologic distension of small bowel is noted. No gross evidence of pneumoperitoneum.  S-shaped scoliosis of the thoracolumbar spine convex to the left of the lower thoracic region and convex to the right in the mid lumbar region incidentally noted.  IMPRESSION: 1.  Nonobstructive bowel gas pattern. 2. No pneumoperitoneum. 3. No radiographic evidence of acute cardiopulmonary disease. 4. S-shaped scoliosis of the thoracolumbar spine.   Electronically Signed   By: Trudie Reed M.D.   On: 01/20/2013 01:26    Microbiology: Recent Results (from the past 240 hour(s))  CLOSTRIDIUM DIFFICILE BY PCR     Status: None   Collection Time    01/20/13  5:49 PM      Result Value Range Status   C difficile by pcr NEGATIVE  NEGATIVE Final     Labs: Basic Metabolic Panel:  Recent Labs Lab 01/19/13 2331 01/21/13 0512  NA 139 136  K 3.4* 3.7  CL 101 102  CO2 27 25  GLUCOSE 123* 106*  BUN 30* 9  CREATININE 0.82 0.62  CALCIUM 10.1 8.6   Liver Function Tests:  Recent Labs Lab 01/19/13 2331  AST 29  ALT 18  ALKPHOS 43  BILITOT 0.4  PROT 7.5  ALBUMIN 4.2    Recent Labs Lab 01/19/13 2331  LIPASE 42   CBC:  Recent Labs Lab 01/19/13 2331 01/21/13 0512 01/22/13 0630  WBC 15.8* 10.9* 6.9  NEUTROABS 13.4*  --   --   HGB 13.3 10.8* 11.1*  HCT 38.6 32.4* 32.9*  MCV 96.5 97.0 97.3  PLT 203 166 166     SignedConley Canal [phone]  [facility] 01/22/2013, 11:24 AM

## 2013-01-22 NOTE — Evaluation (Signed)
Physical Therapy Evaluation Patient Details Name: Patricia Villa MRN: [id number] DOB: 1933/05/03 Today's Date: 01/22/2013 Time: 0865-7846 PT Time Calculation (min): 18 min  PT Assessment / Plan / Recommendation History of Present Illness  Pt adm with colitis.  Clinical Impression  Pt doing well with mobility and feeling stronger.  No further PT needed.    PT Assessment  Patent does not need any further PT services    Follow Up Recommendations  No PT follow up    Does the patient have the potential to tolerate intense rehabilitation      Barriers to Discharge        Equipment Recommendations  None recommended by PT    Recommendations for Other Services     Frequency      Precautions / Restrictions Precautions Precautions: None   Pertinent Vitals/Pain No c/o's      Mobility  Bed Mobility Bed Mobility: Supine to Sit;Sitting - Scoot to Edge of Bed Supine to Sit: 7: Independent Sitting - Scoot to Edge of Bed: 7: Independent Transfers Transfers: Sit to Stand;Stand to Sit Sit to Stand: 6: Modified independent (Device/Increase time);With upper extremity assist;From bed;From toilet Stand to Sit: 6: Modified independent (Device/Increase time);With upper extremity assist;With armrests;To chair/3-in-1;To toilet Ambulation/Gait Ambulation/Gait Assistance: 6: Modified independent (Device/Increase time);5: Supervision Ambulation Distance (Feet): 220 Feet Assistive device: None;Rolling walker Ambulation/Gait Assistance Details: In small environment like room pt able to amb without walker mod I.  In open, busy environment pt superivision without walker and mod I with walker. Gait Pattern: Step-through pattern;Decreased stride length Gait velocity: decr    Exercises     PT Diagnosis:    PT Problem List:   PT Treatment Interventions:       PT Goals(Current goals can be found in the care plan section) Acute Rehab PT Goals PT Goal Formulation: No goals set, d/c  therapy  Visit Information  Last PT Received On: 01/22/13 Assistance Needed: +1 History of Present Illness: Pt adm with colitis.       Prior Functioning  Home Living Family/patient expects to be discharged to:: Private residence Living Arrangements: Spouse/significant other Available Help at Discharge: Family Type of Home: House Home Access: Stairs to enter Home Layout: One level (except 2 steps down to [employer]) Home Equipment: Dan Humphreys - 2 wheels Prior Function Level of Independence: Independent Communication Communication: No difficulties    Cognition  Cognition Arousal/Alertness: Awake/alert Behavior During Therapy: WFL for tasks assessed/performed Overall Cognitive Status: Within Functional Limits for tasks assessed    Extremity/Trunk Assessment Upper Extremity Assessment Upper Extremity Assessment: Overall WFL for tasks assessed Lower Extremity Assessment Lower Extremity Assessment: Overall WFL for tasks assessed   Balance Balance Balance Assessed: Yes Static Standing Balance Static Standing - Balance Support: No upper extremity supported;During functional activity Static Standing - Level of Assistance: 7: Independent  End of Session PT - End of Session Activity Tolerance: Patient tolerated treatment well Patient left: in chair;with call bell/phone within reach Nurse Communication: Mobility status  GP     [facility] 01/22/2013, 11:38 AM  Skip Mayer PT [phone]

## 2013-01-22 NOTE — Discharge Summary (Signed)
Addendum  Patient seen and examined, chart and data base reviewed.  I agree with the above assessment and dicharge plan.  For full details please see Mrs. Algis Downs PA note.  Colitis, discharged on Cipro.   Clint Lipps, MD [facility] Pager: [phone] 01/22/2013, 4:42 PM

## 2013-01-26 ENCOUNTER — Ambulatory Visit (INDEPENDENT_AMBULATORY_CARE_PROVIDER_SITE_OTHER): Payer: Medicare Other | Admitting: Family Medicine

## 2013-01-26 ENCOUNTER — Encounter: Payer: Self-pay | Admitting: Family Medicine

## 2013-01-26 VITALS — BP 123/57 | HR 49 | Temp 97.5°F | Ht 64.0 in | Wt 144.0 lb

## 2013-01-26 DIAGNOSIS — R5381 Other malaise: Secondary | ICD-10-CM | POA: Diagnosis not present

## 2013-01-26 DIAGNOSIS — K5289 Other specified noninfective gastroenteritis and colitis: Secondary | ICD-10-CM

## 2013-01-26 DIAGNOSIS — K529 Noninfective gastroenteritis and colitis, unspecified: Secondary | ICD-10-CM

## 2013-01-26 DIAGNOSIS — R531 Weakness: Secondary | ICD-10-CM

## 2013-01-26 LAB — POCT CBC
Lymph, poc: 1.5 (ref 0.6–3.4)
MCH, POC: 32.9 pg — AB (ref 27–31.2)
MCHC: 34.4 g/dL (ref 31.8–35.4)
MCV: 95.8 fL (ref 80–97)
Platelet Count, POC: 217 10*3/uL (ref 142–424)
RDW, POC: 13.3 %
WBC: 4.7 10*3/uL (ref 4.6–10.2)

## 2013-01-26 NOTE — Patient Instructions (Addendum)
Continue current medications. Continue good therapeutic lifestyle changes.  Fall precautions discussed with patient. Schedule your flu vaccine the first of October. Follow up as planned and earlier as needed. Continue drinking lots of fluids, avoid milk cheese ice cream and dairy products. Also avoid caffeine  Until the visit with a gastroenterologist avoid high fiber and eat foods that are more baked and broiled

## 2013-01-26 NOTE — Progress Notes (Signed)
Subjective:    Patient ID: Patricia Villa, female    DOB: August 29, 1932, 79 y.o.   MRN: [id number]  HPIPT HERE FOR FOLLOW UP FROM RECENT HOSPITAL VISIT FOR COLITIS. Patient here for this followup for colitis. She only complains of weakness and she says that this is the fourth bout of this colitis that she has had. She is taking her last ciprofloxacin today. She has an appointment with a gastroenterologist in one week.   Patient Active Problem List   Diagnosis Date Noted  . Colitis 01/20/2013  . Generalized anxiety disorder 08/05/2012  . Meningioma 08/05/2012  . Bruit 10/17/2010  . Essential hypertension, benign 10/04/2009  . PALPITATIONS 10/04/2009  . MITRAL INSUFFICIENCY 09/26/2009  . MITRAL VALVE PROLAPSE 09/26/2009  . Hypertrophic obstructive cardiomyopathy(425.11) 09/26/2009   Outpatient Encounter Prescriptions as of 01/26/2013  Medication Sig Dispense Refill  . acetaminophen (TYLENOL) 500 MG tablet Take 1,000 mg by mouth 2 (two) times daily as needed for pain.      Marland Kitchen ALPRAZolam (XANAX) 0.25 MG tablet Take 0.125 mg by mouth at bedtime.      Marland Kitchen amLODipine (NORVASC) 2.5 MG tablet Take 2.5 mg by mouth daily.      Marland Kitchen aspirin EC 81 MG tablet Take 81 mg by mouth daily.      . Cholecalciferol (VITAMIN D3) 2000 UNITS TABS Take 2,000 Units by mouth daily.      . DULoxetine (CYMBALTA) 60 MG capsule Take 1 capsule (60 mg total) by mouth daily.  30 capsule  2  . estradiol (VIVELLE-DOT) 0.05 MG/24HR Place 1 patch onto the skin 2 (two) times a week. On Monday and Friday      . HYDROcodone-acetaminophen (NORCO/VICODIN) 5-325 MG per tablet Take 1 tablet by mouth 2 (two) times daily as needed for pain.      Marland Kitchen lisinopril (PRINIVIL,ZESTRIL) 40 MG tablet Take 1 tablet (40 mg total) by mouth daily.      . metoprolol succinate (TOPROL-XL) 50 MG 24 hr tablet Take 50 mg by mouth daily with supper. Take with or immediately following a meal.      . Olopatadine HCl (PATADAY OP) Place 1 drop into both eyes daily  as needed (itching).      Bertram Gala Glycol-Propyl Glycol (SYSTANE OP) Place 1 drop into both eyes 2 (two) times daily as needed (itching).      . pravastatin (PRAVACHOL) 20 MG tablet Take 10 mg by mouth at bedtime.       . [DISCONTINUED] ciprofloxacin (CIPRO) 500 MG tablet Take 1 tablet (500 mg total) by mouth 2 (two) times daily.  8 tablet  0   No facility-administered encounter medications on file as of 01/26/2013.       Review of Systems  HENT: Negative.   Eyes: Negative.   Respiratory: Negative.   Cardiovascular: Negative.   Gastrointestinal: Negative.        NO bm AT ALL SINCE LAST Thursday.  Endocrine: Negative.   Genitourinary: Negative.   Musculoskeletal: Negative.   Skin: Negative.   Allergic/Immunologic: Negative.   Neurological: Positive for weakness.  Hematological: Negative.   Psychiatric/Behavioral: Negative.        Objective:   Physical Exam  Nursing note and vitals reviewed. Constitutional: She is oriented to person, place, and time. She appears well-developed and well-nourished. No distress.  HENT:  Head: Normocephalic and atraumatic.  Eyes: Conjunctivae and EOM are normal. No scleral icterus.  Neck: Normal range of motion. Neck supple. No thyromegaly present.  Cardiovascular: Normal  rate, regular rhythm and normal heart sounds.   No murmur heard. At 60 per minute  Pulmonary/Chest: Effort normal and breath sounds normal. No respiratory distress. She has no wheezes. She has no rales.  Abdominal: Soft. Bowel sounds are normal. She exhibits no mass. There is no tenderness. There is no rebound and no guarding.  Musculoskeletal: Normal range of motion.  Lymphadenopathy:    She has no cervical adenopathy.  Neurological: She is alert and oriented to person, place, and time.  Skin: Skin is warm and dry. No rash noted.  Psychiatric: She has a normal mood and affect. Her behavior is normal. Judgment and thought content normal.  As usual for patient    BP  123/57  Pulse 49  Temp(Src) 97.5 F (36.4 C) (Oral)  Ht 5\' 4"  (1.626 m)  Wt 144 lb (65.318 kg)  BMI 24.71 kg/m2  LMP 08/01/1982       Assessment & Plan:   1. Other and unspecified noninfectious gastroenteritis and colitis(558.9)   2. Colitis   3. Weakness    Orders Placed This Encounter  Procedures  . BMP8+EGFR  . POCT CBC   Patient Instructions  Continue current medications. Continue good therapeutic lifestyle changes.  Fall precautions discussed with patient. Schedule your flu vaccine the first of October. Follow up as planned and earlier as needed. Continue drinking lots of fluids, avoid milk cheese ice cream and dairy products. Also avoid caffeine  Until the visit with a gastroenterologist avoid high fiber and eat foods that are more baked and broiled    Nyra Capes MD

## 2013-01-27 LAB — BMP8+EGFR
BUN/Creatinine Ratio: 17 (ref 11–26)
CO2: 29 mmol/L (ref 18–29)
Chloride: 97 mmol/L (ref 97–108)
GFR calc Af Amer: 94 mL/min/{1.73_m2} (ref >59)
Sodium: 138 mmol/L (ref 134–144)

## 2013-02-02 ENCOUNTER — Encounter: Payer: Self-pay | Admitting: *Deleted

## 2013-02-02 ENCOUNTER — Telehealth: Payer: Self-pay | Admitting: Family Medicine

## 2013-02-02 DIAGNOSIS — K5289 Other specified noninfective gastroenteritis and colitis: Secondary | ICD-10-CM | POA: Diagnosis not present

## 2013-02-04 ENCOUNTER — Ambulatory Visit (INDEPENDENT_AMBULATORY_CARE_PROVIDER_SITE_OTHER): Payer: Medicare Other | Admitting: *Deleted

## 2013-02-04 DIAGNOSIS — Z23 Encounter for immunization: Secondary | ICD-10-CM | POA: Diagnosis not present

## 2013-02-09 ENCOUNTER — Other Ambulatory Visit: Payer: Self-pay | Admitting: Family Medicine

## 2013-02-10 ENCOUNTER — Ambulatory Visit: Payer: Medicare Other

## 2013-02-17 NOTE — Telephone Encounter (Signed)
PT ALREADY AWARE OF LABS AND PT WILL CHECK WITH DR. MEDOFF AND SEE IF THEY NEED A CLEARANCE. IF SO WILL CALL us AND WILL DISCUSS WITH PROVIDER.

## 2013-03-01 ENCOUNTER — Other Ambulatory Visit: Payer: Self-pay | Admitting: Family Medicine

## 2013-03-03 ENCOUNTER — Other Ambulatory Visit: Payer: Self-pay | Admitting: Family Medicine

## 2013-03-04 DIAGNOSIS — K5289 Other specified noninfective gastroenteritis and colitis: Secondary | ICD-10-CM | POA: Diagnosis not present

## 2013-03-04 DIAGNOSIS — D126 Benign neoplasm of colon, unspecified: Secondary | ICD-10-CM | POA: Diagnosis not present

## 2013-03-04 DIAGNOSIS — K5730 Diverticulosis of large intestine without perforation or abscess without bleeding: Secondary | ICD-10-CM | POA: Diagnosis not present

## 2013-03-04 DIAGNOSIS — K648 Other hemorrhoids: Secondary | ICD-10-CM | POA: Diagnosis not present

## 2013-03-04 DIAGNOSIS — K6389 Other specified diseases of intestine: Secondary | ICD-10-CM | POA: Diagnosis not present

## 2013-03-11 ENCOUNTER — Other Ambulatory Visit (HOSPITAL_COMMUNITY): Payer: Self-pay | Admitting: Gastroenterology

## 2013-03-11 DIAGNOSIS — K56699 Other intestinal obstruction unspecified as to partial versus complete obstruction: Secondary | ICD-10-CM

## 2013-03-16 ENCOUNTER — Other Ambulatory Visit (HOSPITAL_COMMUNITY): Payer: Medicare Other

## 2013-03-17 ENCOUNTER — Ambulatory Visit (HOSPITAL_COMMUNITY)
Admission: RE | Admit: 2013-03-17 | Discharge: 2013-03-17 | Disposition: A | Discharge status: Home / Self Care | Payer: Medicare Other | Source: Ambulatory Visit | Attending: Gastroenterology | Admitting: Gastroenterology

## 2013-03-17 DIAGNOSIS — K56699 Other intestinal obstruction unspecified as to partial versus complete obstruction: Secondary | ICD-10-CM

## 2013-03-17 DIAGNOSIS — R198 Other specified symptoms and signs involving the digestive system and abdomen: Secondary | ICD-10-CM | POA: Diagnosis not present

## 2013-03-17 DIAGNOSIS — K5289 Other specified noninfective gastroenteritis and colitis: Secondary | ICD-10-CM | POA: Insufficient documentation

## 2013-03-17 DIAGNOSIS — K5730 Diverticulosis of large intestine without perforation or abscess without bleeding: Secondary | ICD-10-CM | POA: Insufficient documentation

## 2013-03-23 DIAGNOSIS — K5900 Constipation, unspecified: Secondary | ICD-10-CM | POA: Diagnosis not present

## 2013-03-26 ENCOUNTER — Ambulatory Visit (INDEPENDENT_AMBULATORY_CARE_PROVIDER_SITE_OTHER): Payer: Medicare Other | Admitting: Nurse Practitioner

## 2013-03-26 ENCOUNTER — Encounter: Payer: Self-pay | Admitting: Nurse Practitioner

## 2013-03-26 ENCOUNTER — Ambulatory Visit (INDEPENDENT_AMBULATORY_CARE_PROVIDER_SITE_OTHER): Payer: Medicare Other

## 2013-03-26 VITALS — BP 132/62 | HR 55 | Temp 98.9°F | Ht 64.0 in | Wt 139.0 lb

## 2013-03-26 DIAGNOSIS — M542 Cervicalgia: Secondary | ICD-10-CM | POA: Diagnosis not present

## 2013-03-26 MED ORDER — KETOROLAC TROMETHAMINE 60 MG/2ML IM SOLN: 60.0000 mg | Freq: Once | INTRAMUSCULAR | Status: DC

## 2013-03-26 MED ORDER — HYDROCODONE-ACETAMINOPHEN 5-325 MG PO TABS: 1.0000 | ORAL_TABLET | Freq: Two times a day (BID) | ORAL | Status: DC | PRN

## 2013-03-26 NOTE — Progress Notes (Signed)
  Subjective:    Patient ID: Patricia Villa, female    DOB: 03/14/1933, 79 y.o.   MRN: [id number]  HPI  Patient in c/o beck pain- started several months ago- said that it hurts to turn her head- some days are worse then others. Patient has been taking tylenol when it really bothers her which is usually 2 x a day.     Review of Systems  Constitutional: Negative.   HENT: Negative.   Respiratory: Negative.   Cardiovascular: Negative.   Gastrointestinal: Negative.   Musculoskeletal: Positive for neck pain and neck stiffness.  Neurological: Negative for dizziness and headaches.       Objective:   Physical Exam  Constitutional: She is oriented to person, place, and time. She appears well-developed and well-nourished.  Cardiovascular: Normal rate, regular rhythm and normal heart sounds.   Pulmonary/Chest: Effort normal and breath sounds normal.  Musculoskeletal:  Limited ROM of cervical spine with limited rotation and extension- no point tenderness.  Neurological: She is alert and oriented to person, place, and time. She has normal reflexes.  Psychiatric: She has a normal mood and affect. Her behavior is normal. Judgment and thought content normal.    BP 132/62  Pulse 55  Temp(Src) 98.9 F (37.2 C) (Oral)  Ht 5\' 4"  (1.626 m)  Wt 139 lb (63.05 kg)  BMI 23.85 kg/m2  LMP 08/01/1982  Cervical spine xray- wnl- Preliminary reading by Paulene Floor, FNP  [facility]       Assessment & Plan:   1. Neck pain    Meds ordered this encounter  Medications  . DISCONTD: ketorolac (TORADOL) injection 60 mg    Sig:   . HYDROcodone-acetaminophen (NORCO/VICODIN) 5-325 MG per tablet    Sig: Take 1 tablet by mouth 2 (two) times daily as needed.    Dispense:  30 tablet    Refill:  0    Order Specific Question:  Supervising Provider    Answer:  [name] [[id number]]   Moist heat Rest rto prn  Mary-Margaret Daphine Deutscher, FNP

## 2013-03-26 NOTE — Patient Instructions (Signed)
Moist heat Hydrocodone as needed Tylenol OTC prn

## 2013-03-29 ENCOUNTER — Encounter: Payer: Self-pay | Admitting: Family Medicine

## 2013-04-05 ENCOUNTER — Ambulatory Visit (INDEPENDENT_AMBULATORY_CARE_PROVIDER_SITE_OTHER): Payer: Medicare Other | Admitting: *Deleted

## 2013-04-05 DIAGNOSIS — Z23 Encounter for immunization: Secondary | ICD-10-CM | POA: Diagnosis not present

## 2013-04-07 NOTE — Telephone Encounter (Signed)
Pt has seen DR Danielle Dess in the past and had a Mri once. Already doing the warm wet compressions.  What do you recommend her do ? Is a little better today but still has pain on right side and it goes upward into her head.

## 2013-04-13 ENCOUNTER — Telehealth: Payer: Self-pay | Admitting: Family Medicine

## 2013-04-13 NOTE — Telephone Encounter (Signed)
Can take 2 OTC ibuprofen every 6 hours

## 2013-04-14 NOTE — Telephone Encounter (Signed)
Pt said she was better today and refused appt for today. No headache today. Advised ntbs again and I discussed this with Paulene Floor

## 2013-04-20 DIAGNOSIS — R82998 Other abnormal findings in urine: Secondary | ICD-10-CM | POA: Diagnosis not present

## 2013-04-20 DIAGNOSIS — R300 Dysuria: Secondary | ICD-10-CM | POA: Diagnosis not present

## 2013-05-17 ENCOUNTER — Encounter: Payer: Self-pay | Admitting: Family Medicine

## 2013-05-17 ENCOUNTER — Ambulatory Visit (INDEPENDENT_AMBULATORY_CARE_PROVIDER_SITE_OTHER): Payer: Medicare Other | Admitting: Family Medicine

## 2013-05-17 VITALS — BP 111/53 | HR 52 | Temp 99.4°F | Ht 64.0 in | Wt 140.0 lb

## 2013-05-17 DIAGNOSIS — E785 Hyperlipidemia, unspecified: Secondary | ICD-10-CM | POA: Insufficient documentation

## 2013-05-17 DIAGNOSIS — E559 Vitamin D deficiency, unspecified: Secondary | ICD-10-CM

## 2013-05-17 DIAGNOSIS — I10 Essential (primary) hypertension: Secondary | ICD-10-CM

## 2013-05-17 DIAGNOSIS — D320 Benign neoplasm of cerebral meninges: Secondary | ICD-10-CM

## 2013-05-17 DIAGNOSIS — R5383 Other fatigue: Secondary | ICD-10-CM | POA: Diagnosis not present

## 2013-05-17 DIAGNOSIS — M542 Cervicalgia: Secondary | ICD-10-CM

## 2013-05-17 DIAGNOSIS — R5381 Other malaise: Secondary | ICD-10-CM

## 2013-05-17 DIAGNOSIS — R51 Headache: Secondary | ICD-10-CM

## 2013-05-17 DIAGNOSIS — D329 Benign neoplasm of meninges, unspecified: Secondary | ICD-10-CM

## 2013-05-17 LAB — POCT CBC
GRANULOCYTE PERCENT: 57.9 % (ref 37–80)
HCT, POC: 38 % (ref 37.7–47.9)
Hemoglobin: 11.7 g/dL — AB (ref 12.2–16.2)
Lymph, poc: 2.2 (ref 0.6–3.4)
MCH: 29.4 pg (ref 27–31.2)
MCHC: 30.9 g/dL — AB (ref 31.8–35.4)
MCV: 95.1 fL (ref 80–97)
MPV: 9.3 fL (ref 0–99.8)
POC Granulocyte: 3.6 (ref 2–6.9)
POC LYMPH PERCENT: 34.3 %L (ref 10–50)
Platelet Count, POC: 198 10*3/uL (ref 142–424)
RBC: 4 M/uL — AB (ref 4.04–5.48)
RDW, POC: 13 %
WBC: 6.3 10*3/uL (ref 4.6–10.2)

## 2013-05-17 NOTE — Addendum Note (Signed)
Addended by: Zannie Cove on: 05/17/2013 12:21 PM   Modules accepted: Orders

## 2013-05-17 NOTE — Patient Instructions (Addendum)
Continue current medications. Continue good therapeutic lifestyle changes which include good diet and exercise. Fall precautions discussed with patient. Schedule your flu vaccine if you haven't had it yet If you are over 78 years old - you may need Prevnar 67 or the adult Pneumonia vaccine. Continue to use warm wet compresses to posterior neck Avoid heavy lifting pushing or pulling Take Tylenol for pain We will arrange for you to get an MRI of the cervical spine and an MRI of the brain to followup on the meningioma

## 2013-05-17 NOTE — Progress Notes (Signed)
Subjective:    Patient ID: Patricia Villa, female    DOB: 09-Jul-1932, 80 y.o.   MRN: [id number]  HPI Pt here for follow up and management of chronic medical problems. Patient complains of malaise and arthralgias.: Health maintenance parameters everything appears to be up-to-date. She is already scheduled for her mammogram. Reset C-spine films were were reviewed and indicated the patient had degenerative facet disease. Her last MRI of the brain was in February 2014.       Patient Active Problem List   Diagnosis Date Noted  . Hyperlipidemia 05/17/2013  . Colitis 01/20/2013  . Generalized anxiety disorder 08/05/2012  . Meningioma 08/05/2012  . Bruit 10/17/2010  . Essential hypertension, benign 10/04/2009  . PALPITATIONS 10/04/2009  . MITRAL INSUFFICIENCY 09/26/2009  . MITRAL VALVE PROLAPSE 09/26/2009  . Hypertrophic obstructive cardiomyopathy(425.11) 09/26/2009   Outpatient Encounter Prescriptions as of 05/17/2013  Medication Sig  . acetaminophen (TYLENOL) 500 MG tablet Take 1,000 mg by mouth 2 (two) times daily as needed for pain.  Marland Kitchen ALPRAZolam (XANAX) 0.25 MG tablet Take 0.125 mg by mouth at bedtime.  Marland Kitchen amLODipine (NORVASC) 2.5 MG tablet Take 2.5 mg by mouth daily.  Marland Kitchen aspirin EC 81 MG tablet Take 81 mg by mouth daily.  . Cholecalciferol (VITAMIN D3) 2000 UNITS TABS Take 2,000 Units by mouth daily.  . CYMBALTA 60 MG capsule TAKE 1 CAPSULE (60 MG TOTAL)  BY MOUTH DAILY.  Marland Kitchen estradiol (MINIVELLE) 0.05 MG/24HR patch Place 1 patch onto the skin 2 (two) times a week.  Marland Kitchen HYDROcodone-acetaminophen (NORCO/VICODIN) 5-325 MG per tablet Take 1 tablet by mouth 2 (two) times daily as needed.  Marland Kitchen lisinopril (PRINIVIL,ZESTRIL) 40 MG tablet TAKE ONE TABLET BY MOUTH ONE TIME DAILY  . metoprolol succinate (TOPROL-XL) 50 MG 24 hr tablet Take 50 mg by mouth daily with supper. Take with or immediately following a meal.  . Polyethyl Glycol-Propyl Glycol (SYSTANE OP) Place 1 drop into both eyes 2 (two)  times daily as needed (itching).  . pravastatin (PRAVACHOL) 20 MG tablet Take 10 mg by mouth at bedtime.   . [DISCONTINUED] ALPRAZolam (XANAX) 0.25 MG tablet Take one po qd  . [DISCONTINUED] amLODipine (NORVASC) 2.5 MG tablet Take 2.5 mg by mouth daily.    . [DISCONTINUED] aspirin 81 MG tablet Take 81 mg by mouth daily.    . [DISCONTINUED] Cholecalciferol (VITAMIN D3) 1000 UNITS tablet Take 3,000 Units by mouth daily.   . [DISCONTINUED] DULoxetine (CYMBALTA) 60 MG capsule Take 1 capsule (60 mg total) by mouth daily.  . [DISCONTINUED] estradiol (VIVELLE-DOT) 0.05 MG/24HR Place 1 patch onto the skin 2 (two) times a week. On Monday and Friday  . [DISCONTINUED] hydrocodone-acetaminophen (LORCET-HD) 5-500 MG per capsule Take 1 capsule by mouth as needed.  . [DISCONTINUED] lisinopril (PRINIVIL,ZESTRIL) 40 MG tablet Take 40 mg by mouth daily.   . [DISCONTINUED] metoprolol (TOPROL-XL) 50 MG 24 hr tablet Take 50 mg by mouth daily.    . [DISCONTINUED] Olopatadine HCl (PATADAY OP) Place 1 drop into both eyes daily as needed (itching).    Review of Systems  Constitutional: Positive for fever ("no energy").  HENT: Negative.   Eyes: Negative.   Respiratory: Negative.   Cardiovascular: Negative.   Gastrointestinal: Negative.   Endocrine: Negative.   Genitourinary: Negative.   Musculoskeletal: Positive for arthralgias (right hip pain) and neck pain.  Skin: Negative.   Allergic/Immunologic: Negative.   Neurological: Negative.   Hematological: Negative.   Psychiatric/Behavioral: Negative.  Objective:   Physical Exam  Nursing note and vitals reviewed. Constitutional: She is oriented to person, place, and time. She appears well-developed and well-nourished. No distress.  HENT:  Head: Normocephalic and atraumatic.  Right Ear: External ear normal.  Left Ear: External ear normal.  Nose: Nose normal.  Mouth/Throat: Oropharynx is clear and moist. No oropharyngeal exudate.  Eyes: Conjunctivae  and EOM are normal. Pupils are equal, round, and reactive to light. Right eye exhibits no discharge. Left eye exhibits no discharge. No scleral icterus.  Neck: Normal range of motion. Neck supple. No JVD present. No thyromegaly present.  Cardiovascular: Normal rate, regular rhythm, normal heart sounds and intact distal pulses.  Exam reveals no gallop and no friction rub.   No murmur heard. At 52-60 per minute  Pulmonary/Chest: Effort normal and breath sounds normal. No respiratory distress. She has no wheezes. She has no rales. She exhibits no tenderness.  Abdominal: Soft. Bowel sounds are normal. She exhibits no mass. There is no tenderness. There is no rebound and no guarding.  Musculoskeletal: Normal range of motion. She exhibits no edema and no tenderness.  Some discomfort with turning head from side to side and looking up and looking down. This discomfort is posteriorly  Lymphadenopathy:    She has no cervical adenopathy.  Neurological: She is alert and oriented to person, place, and time. She has normal reflexes. No cranial nerve deficit.  Skin: Skin is warm and dry.  Psychiatric: She has a normal mood and affect. Her behavior is normal. Judgment and thought content normal.   BP 111/53  Pulse 52  Temp(Src) 99.4 F (37.4 C) (Oral)  Ht 5' 4"  (1.626 m)  Wt 140 lb (63.504 kg)  BMI 24.02 kg/m2  LMP 08/01/1982        Assessment & Plan:  1. Essential hypertension, benign - POCT CBC - BMP8+EGFR - Hepatic function panel  2. Hyperlipidemia - POCT CBC - NMR, lipoprofile  3. Vitamin D deficiency - POCT CBC - Vit D  25 hydroxy (rtn osteoporosis monitoring)  4. Other malaise and fatigue - POCT CBC  5. Headache(784.0)  6. Neck pain, bilateral  7. Meningioma Orders Placed This Encounter  Procedures  . BMP8+EGFR  . Hepatic function panel  . NMR, lipoprofile  . Vit D  25 hydroxy (rtn osteoporosis monitoring)  . POCT CBC   Meds ordered this encounter  Medications  .  estradiol (MINIVELLE) 0.05 MG/24HR patch    Sig: Place 1 patch onto the skin 2 (two) times a week.   Patient Instructions  Continue current medications. Continue good therapeutic lifestyle changes which include good diet and exercise. Fall precautions discussed with patient. Schedule your flu vaccine if you haven't had it yet If you are over 56 years old - you may need Prevnar 98 or the adult Pneumonia vaccine. Continue to use warm wet compresses to posterior neck Avoid heavy lifting pushing or pulling Take Tylenol for pain We will arrange for you to get an MRI of the cervical spine and an MRI of the brain to followup on the meningioma   Arrie Senate MD

## 2013-05-19 LAB — NMR, LIPOPROFILE
Cholesterol: 199 mg/dL (ref <200)
HDL Cholesterol by NMR: 58 mg/dL (ref >=40)
HDL Particle Number: 30.7 umol/L (ref >=30.5)
LDL Particle Number: 1792 nmol/L — ABNORMAL HIGH (ref <1000)
LDL SIZE: 21.2 nm (ref >20.5)
LDLC SERPL CALC-MCNC: 123 mg/dL — ABNORMAL HIGH (ref <100)
LP-IR Score: <25 (ref <=45)
Small LDL Particle Number: 182 nmol/L (ref <=527)
Triglycerides by NMR: 92 mg/dL (ref <150)

## 2013-05-19 LAB — BMP8+EGFR
BUN/Creatinine Ratio: 35 — ABNORMAL HIGH (ref 11–26)
BUN: 22 mg/dL (ref 8–27)
CO2: 27 mmol/L (ref 18–29)
Calcium: 9.4 mg/dL (ref 8.6–10.2)
Chloride: 101 mmol/L (ref 97–108)
Creatinine, Ser: 0.62 mg/dL (ref 0.57–1.00)
GFR calc Af Amer: 100 mL/min/{1.73_m2} (ref >59)
GFR calc non Af Amer: 87 mL/min/{1.73_m2} (ref >59)
Glucose: 84 mg/dL (ref 65–99)
Potassium: 4.1 mmol/L (ref 3.5–5.2)
Sodium: 140 mmol/L (ref 134–144)

## 2013-05-19 LAB — HEPATIC FUNCTION PANEL
ALBUMIN: 4.2 g/dL (ref 3.5–4.8)
ALT: 13 IU/L (ref 0–32)
AST: 22 IU/L (ref 0–40)
Alkaline Phosphatase: 41 IU/L (ref 39–117)
BILIRUBIN DIRECT: 0.15 mg/dL (ref 0.00–0.40)
Total Bilirubin: 0.5 mg/dL (ref 0.0–1.2)
Total Protein: 6.8 g/dL (ref 6.0–8.5)

## 2013-05-19 LAB — VITAMIN D 25 HYDROXY (VIT D DEFICIENCY, FRACTURES): Vit D, 25-Hydroxy: 43.8 ng/mL (ref 30.0–100.0)

## 2013-05-29 ENCOUNTER — Other Ambulatory Visit: Payer: Medicare Other

## 2013-05-31 ENCOUNTER — Ambulatory Visit
Admission: RE | Admit: 2013-05-31 | Discharge: 2013-05-31 | Disposition: A | Discharge status: Home / Self Care | Payer: Medicare Other | Source: Ambulatory Visit | Attending: Family Medicine | Admitting: Family Medicine

## 2013-05-31 DIAGNOSIS — M542 Cervicalgia: Secondary | ICD-10-CM

## 2013-05-31 DIAGNOSIS — R51 Headache: Secondary | ICD-10-CM

## 2013-05-31 DIAGNOSIS — D329 Benign neoplasm of meninges, unspecified: Secondary | ICD-10-CM

## 2013-05-31 DIAGNOSIS — M47817 Spondylosis without myelopathy or radiculopathy, lumbosacral region: Secondary | ICD-10-CM | POA: Diagnosis not present

## 2013-05-31 DIAGNOSIS — D320 Benign neoplasm of cerebral meninges: Secondary | ICD-10-CM | POA: Diagnosis not present

## 2013-05-31 DIAGNOSIS — M5137 Other intervertebral disc degeneration, lumbosacral region: Secondary | ICD-10-CM | POA: Diagnosis not present

## 2013-05-31 MED ORDER — GADOBENATE DIMEGLUMINE 529 MG/ML IV SOLN
13.0000 mL | Freq: Once | INTRAVENOUS | Status: AC | PRN
  Administered 2013-05-31: 13 mL via INTRAVENOUS

## 2013-06-09 DIAGNOSIS — H532 Diplopia: Secondary | ICD-10-CM | POA: Diagnosis not present

## 2013-06-09 DIAGNOSIS — Z961 Presence of intraocular lens: Secondary | ICD-10-CM | POA: Diagnosis not present

## 2013-06-10 ENCOUNTER — Other Ambulatory Visit: Payer: Self-pay | Admitting: Family Medicine

## 2013-06-15 ENCOUNTER — Other Ambulatory Visit: Payer: Self-pay | Admitting: Gastroenterology

## 2013-06-15 DIAGNOSIS — K529 Noninfective gastroenteritis and colitis, unspecified: Secondary | ICD-10-CM

## 2013-06-15 DIAGNOSIS — K5900 Constipation, unspecified: Secondary | ICD-10-CM | POA: Diagnosis not present

## 2013-06-16 DIAGNOSIS — I10 Essential (primary) hypertension: Secondary | ICD-10-CM | POA: Diagnosis not present

## 2013-06-16 DIAGNOSIS — M47812 Spondylosis without myelopathy or radiculopathy, cervical region: Secondary | ICD-10-CM | POA: Diagnosis not present

## 2013-06-16 DIAGNOSIS — Z6829 Body mass index (BMI) 29.0-29.9, adult: Secondary | ICD-10-CM | POA: Diagnosis not present

## 2013-06-23 DIAGNOSIS — E663 Overweight: Secondary | ICD-10-CM | POA: Diagnosis not present

## 2013-06-23 DIAGNOSIS — M531 Cervicobrachial syndrome: Secondary | ICD-10-CM | POA: Diagnosis not present

## 2013-06-25 ENCOUNTER — Other Ambulatory Visit: Payer: Self-pay | Admitting: Gastroenterology

## 2013-06-25 ENCOUNTER — Ambulatory Visit
Admission: RE | Admit: 2013-06-25 | Discharge: 2013-06-25 | Disposition: A | Discharge status: Home / Self Care | Payer: Medicare Other | Source: Ambulatory Visit | Attending: Gastroenterology | Admitting: Gastroenterology

## 2013-06-25 DIAGNOSIS — K529 Noninfective gastroenteritis and colitis, unspecified: Secondary | ICD-10-CM

## 2013-06-25 DIAGNOSIS — I700 Atherosclerosis of aorta: Secondary | ICD-10-CM | POA: Diagnosis not present

## 2013-06-25 MED ORDER — IOHEXOL 350 MG/ML SOLN
100.0000 mL | Freq: Once | INTRAVENOUS | Status: AC | PRN
  Administered 2013-06-25: 100 mL via INTRAVENOUS

## 2013-07-03 ENCOUNTER — Other Ambulatory Visit: Payer: Self-pay | Admitting: Family Medicine

## 2013-07-05 ENCOUNTER — Telehealth: Payer: Self-pay | Admitting: Family Medicine

## 2013-07-05 DIAGNOSIS — K5730 Diverticulosis of large intestine without perforation or abscess without bleeding: Secondary | ICD-10-CM | POA: Diagnosis not present

## 2013-07-06 ENCOUNTER — Other Ambulatory Visit: Payer: Self-pay

## 2013-07-06 MED ORDER — ALPRAZOLAM 0.25 MG PO TABS: 0.1250 mg | ORAL_TABLET | Freq: Every day | ORAL | Status: DC

## 2013-07-06 NOTE — Telephone Encounter (Signed)
Last seen 05/17/13  DWM  If approved route to nurse to call into [facility]

## 2013-07-06 NOTE — Telephone Encounter (Signed)
This is okay to refill 

## 2013-07-07 ENCOUNTER — Other Ambulatory Visit: Payer: Self-pay

## 2013-07-07 DIAGNOSIS — Z1231 Encounter for screening mammogram for malignant neoplasm of breast: Secondary | ICD-10-CM | POA: Diagnosis not present

## 2013-07-07 MED ORDER — PRAVASTATIN SODIUM 20 MG PO TABS: 10.0000 mg | ORAL_TABLET | Freq: Every day | ORAL | Status: DC

## 2013-07-07 NOTE — Telephone Encounter (Signed)
Pharmacy sent over as pravastatin 40 mg and said patient says she takes 10 mg daily  We have in EPIC 20 mg daily ????

## 2013-07-08 NOTE — Telephone Encounter (Signed)
Called in.

## 2013-07-12 ENCOUNTER — Other Ambulatory Visit: Payer: Self-pay | Admitting: *Deleted

## 2013-07-12 MED ORDER — PRAVASTATIN SODIUM 20 MG PO TABS: 10.0000 mg | ORAL_TABLET | Freq: Every day | ORAL | Status: DC

## 2013-07-12 MED ORDER — ALPRAZOLAM 0.25 MG PO TABS: 0.1250 mg | ORAL_TABLET | Freq: Every evening | ORAL | Status: DC | PRN

## 2013-07-20 DIAGNOSIS — H5051 Esophoria: Secondary | ICD-10-CM | POA: Diagnosis not present

## 2013-07-20 DIAGNOSIS — D320 Benign neoplasm of cerebral meninges: Secondary | ICD-10-CM | POA: Diagnosis not present

## 2013-07-31 ENCOUNTER — Other Ambulatory Visit: Payer: Self-pay | Admitting: Family Medicine

## 2013-08-03 ENCOUNTER — Telehealth: Payer: Self-pay | Admitting: Family Medicine

## 2013-08-17 DIAGNOSIS — M5412 Radiculopathy, cervical region: Secondary | ICD-10-CM | POA: Diagnosis not present

## 2013-08-17 DIAGNOSIS — M542 Cervicalgia: Secondary | ICD-10-CM | POA: Diagnosis not present

## 2013-09-02 DIAGNOSIS — M47812 Spondylosis without myelopathy or radiculopathy, cervical region: Secondary | ICD-10-CM | POA: Diagnosis not present

## 2013-09-02 DIAGNOSIS — M542 Cervicalgia: Secondary | ICD-10-CM | POA: Diagnosis not present

## 2013-09-02 DIAGNOSIS — M5412 Radiculopathy, cervical region: Secondary | ICD-10-CM | POA: Diagnosis not present

## 2013-09-06 ENCOUNTER — Other Ambulatory Visit: Payer: Self-pay | Admitting: Pharmacist

## 2013-09-06 NOTE — Telephone Encounter (Signed)
Patient was in with her husband and asked about refill on pravastatin.  According to records Rx with 6RF was sent to [facility] 07/2013. Called [facility] and left message to fill rx and if no refills to let me know.

## 2013-09-15 ENCOUNTER — Ambulatory Visit (INDEPENDENT_AMBULATORY_CARE_PROVIDER_SITE_OTHER): Payer: Medicare Other

## 2013-09-15 ENCOUNTER — Ambulatory Visit (INDEPENDENT_AMBULATORY_CARE_PROVIDER_SITE_OTHER): Payer: Medicare Other | Admitting: Family Medicine

## 2013-09-15 ENCOUNTER — Encounter: Payer: Self-pay | Admitting: Family Medicine

## 2013-09-15 VITALS — BP 122/60 | HR 52 | Temp 98.4°F | Ht 64.0 in | Wt 138.0 lb

## 2013-09-15 DIAGNOSIS — I10 Essential (primary) hypertension: Secondary | ICD-10-CM

## 2013-09-15 DIAGNOSIS — E785 Hyperlipidemia, unspecified: Secondary | ICD-10-CM

## 2013-09-15 DIAGNOSIS — E559 Vitamin D deficiency, unspecified: Secondary | ICD-10-CM

## 2013-09-15 DIAGNOSIS — M25559 Pain in unspecified hip: Secondary | ICD-10-CM

## 2013-09-15 DIAGNOSIS — R42 Dizziness and giddiness: Secondary | ICD-10-CM

## 2013-09-15 DIAGNOSIS — M25551 Pain in right hip: Secondary | ICD-10-CM

## 2013-09-15 LAB — POCT CBC
Granulocyte percent: 66.6 %G (ref 37–80)
HEMATOCRIT: 38.7 % (ref 37.7–47.9)
Hemoglobin: 12.1 g/dL — AB (ref 12.2–16.2)
Lymph, poc: 1.8 (ref 0.6–3.4)
MCH, POC: 30.9 pg (ref 27–31.2)
MCHC: 31.3 g/dL — AB (ref 31.8–35.4)
MCV: 98.9 fL — AB (ref 80–97)
MPV: 10.1 fL (ref 0–99.8)
POC GRANULOCYTE: 4.1 (ref 2–6.9)
POC LYMPH PERCENT: 29.3 %L (ref 10–50)
Platelet Count, POC: 198 10*3/uL (ref 142–424)
RBC: 3.9 M/uL — AB (ref 4.04–5.48)
RDW, POC: 13.5 %
WBC: 6.2 10*3/uL (ref 4.6–10.2)

## 2013-09-15 NOTE — Patient Instructions (Addendum)
Medicare Annual Wellness Visit  [facility] and the medical providers at [facility] strive to bring you the best medical care.  In doing so we not only want to address your current medical conditions and concerns but also to detect new conditions early and prevent illness, disease and health-related problems.    Medicare offers a yearly Wellness Visit which allows our clinical staff to assess your need for preventative services including immunizations, lifestyle education, counseling to decrease risk of preventable diseases and screening for fall risk and other medical concerns.    This visit is provided free of charge (no copay) for all Medicare recipients. The clinical pharmacists at [facility] have begun to conduct these Wellness Visits which will also include a thorough review of all your medications.    As you primary medical provider recommend that you make an appointment for your Annual Wellness Visit if you have not done so already this year.  You may set up this appointment before you leave today or you may call back ([phone]) and schedule an appointment.  Please make sure when you call that you mention that you are scheduling your Annual Wellness Visit with the clinical pharmacist so that the appointment may be made for the proper length of time.      Continue current medications. Continue good therapeutic lifestyle changes which include good diet and exercise. Fall precautions discussed with patient. If an FOBT was given today- please return it to our front desk. If you are over 44 years old - you may need Prevnar 98 or the adult Pneumonia vaccine.  Continue her followup with the neurosurgeon and the cardiologist as planned. Monitor blood pressures more closely at home especially when you have episodes of dizziness. Do not climb and put yourself at risk for falling If the monitor blood pressures run in the 48A for  the systolic readings please get back in touch with [place] We will call you with the results of the chest x-ray and lab work as soon as those results are available Return the FOBT

## 2013-09-15 NOTE — Progress Notes (Signed)
Subjective:    Patient ID: Patricia Villa, female    DOB: [birth year], 80 y.o.   MRN: [id number]  HPI Pt here for follow up and management of chronic medical problems. The patient does complain of some hip and neck pain. She also has some occasional dizziness which she is attributing to some of her medication. She is due to get a chest x-ray today and will be given and FOBT to return. She will also get lab work today. She has an appointment with a cardiologist in June for routine followup. All other health maintenance parameters appear to be up-to-date. The patient is very calm and pleasant and appears to be very relaxed. She is concerned about her hip pain but will be seeing the neurosurgeon again about this.       Patient Active Problem List   Diagnosis Date Noted  . Hyperlipidemia 05/17/2013  . Colitis 01/20/2013  . Generalized anxiety disorder 08/05/2012  . Meningioma 08/05/2012  . Bruit 10/17/2010  . Essential hypertension, benign 10/04/2009  . PALPITATIONS 10/04/2009  . MITRAL INSUFFICIENCY 09/26/2009  . MITRAL VALVE PROLAPSE 09/26/2009  . Hypertrophic obstructive cardiomyopathy(425.11) 09/26/2009   Outpatient Encounter Prescriptions as of 09/15/2013  Medication Sig  . acetaminophen (TYLENOL) 500 MG tablet Take 1,000 mg by mouth 2 (two) times daily as needed for pain.  Marland Kitchen ALPRAZolam (XANAX) 0.25 MG tablet Take 0.5 tablets (0.125 mg total) by mouth at bedtime as needed for anxiety.  Marland Kitchen amLODipine (NORVASC) 2.5 MG tablet TAKE ONE TABLET BY MOUTH ONE TIME DAILY  . aspirin EC 81 MG tablet Take 81 mg by mouth daily.  . Cholecalciferol (VITAMIN D3) 2000 UNITS TABS Take 2,000 Units by mouth daily.  . DULoxetine (CYMBALTA) 60 MG capsule TAKE ONE CAPSULE BY MOUTH ONE TIME DAILY  . estradiol (MINIVELLE) 0.05 MG/24HR patch Place 1 patch onto the skin 2 (two) times a week.  Marland Kitchen lisinopril (PRINIVIL,ZESTRIL) 40 MG tablet TAKE ONE TABLET BY MOUTH ONE TIME DAILY  . metoprolol succinate  (TOPROL-XL) 50 MG 24 hr tablet TAKE ONE TABLET BY MOUTH ONE TIME DAILY  . Polyethyl Glycol-Propyl Glycol (SYSTANE OP) Place 1 drop into both eyes 2 (two) times daily as needed (itching).  . pravastatin (PRAVACHOL) 20 MG tablet Take 0.5 tablets (10 mg total) by mouth at bedtime.  Marland Kitchen HYDROcodone-acetaminophen (NORCO/VICODIN) 5-325 MG per tablet Take 1 tablet by mouth 2 (two) times daily as needed.  . [DISCONTINUED] amLODipine (NORVASC) 2.5 MG tablet Take 2.5 mg by mouth daily.  . [DISCONTINUED] metoprolol succinate (TOPROL-XL) 50 MG 24 hr tablet Take 50 mg by mouth daily with supper. Take with or immediately following a meal.    Review of Systems  Constitutional: Negative.   HENT: Negative.   Eyes: Negative.   Respiratory: Negative.   Cardiovascular: Negative.   Gastrointestinal: Negative.   Endocrine: Negative.   Genitourinary: Negative.   Musculoskeletal: Positive for arthralgias (right hip pain) and neck pain (had injection 1 mo ago).  Skin: Negative.   Allergic/Immunologic: Negative.   Neurological: Positive for dizziness (at times (thinks it could meds)).  Hematological: Negative.   Psychiatric/Behavioral: Negative.        Objective:   Physical Exam  Nursing note and vitals reviewed. Constitutional: She is oriented to person, place, and time. She appears well-developed and well-nourished. No distress.  HENT:  Head: Normocephalic and atraumatic.  Right Ear: External ear normal.  Left Ear: External ear normal.  Nose: Nose normal.  Mouth/Throat: Oropharynx is clear and moist.  Slight redness of right earlobe  Eyes: Conjunctivae and EOM are normal. Pupils are equal, round, and reactive to light. Right eye exhibits no discharge. Left eye exhibits no discharge. No scleral icterus.  Neck: Normal range of motion. Neck supple. No thyromegaly present.  Cardiovascular: Normal rate, regular rhythm, normal heart sounds and intact distal pulses.  Exam reveals no gallop and no friction rub.    No murmur heard. At 60 per minute, patient has a lot of varicosities about both ankles.  Pulmonary/Chest: Effort normal and breath sounds normal. No respiratory distress. She has no wheezes. She has no rales. She exhibits no tenderness.  Abdominal: Soft. Bowel sounds are normal. She exhibits no mass. There is no tenderness. There is no rebound and no guarding.  Musculoskeletal: Normal range of motion. She exhibits no edema and no tenderness.  Lymphadenopathy:    She has no cervical adenopathy.  Neurological: She is alert and oriented to person, place, and time. She has normal reflexes. No cranial nerve deficit.  Skin: Skin is warm and dry. No rash noted.  Psychiatric: She has a normal mood and affect. Her behavior is normal. Judgment and thought content normal.   BP 107/59  Pulse 52  Temp(Src) 98.4 F (36.9 C) (Oral)  Ht 5' 4"  (1.626 m)  Wt 138 lb (62.596 kg)  BMI 23.68 kg/m2  LMP 08/01/1982  [facility] reading (PRIMARY) by  Dr. Brunilda Payor x-ray -borderline enlargement of the heart and kyphotic spine                                      Assessment & Plan:  1. Essential hypertension, benign - POCT CBC - BMP8+EGFR - Hepatic function panel - DG Chest 2 View; Future  2. Hyperlipidemia - POCT CBC - NMR, lipoprofile  3. Vitamin D deficiency - Vit D  25 hydroxy (rtn osteoporosis monitoring)  4. Dizziness and giddiness -Monitor blood pressures more closely at home  5. Right hip pain -Discussed with neurosurgeon at next visit  Patient Instructions                       Medicare Annual Wellness Visit  [facility] and the medical providers at [facility] strive to bring you the best medical care.  In doing so we not only want to address your current medical conditions and concerns but also to detect new conditions early and prevent illness, disease and health-related problems.    Medicare offers a yearly Wellness Visit which allows our clinical staff to  assess your need for preventative services including immunizations, lifestyle education, counseling to decrease risk of preventable diseases and screening for fall risk and other medical concerns.    This visit is provided free of charge (no copay) for all Medicare recipients. The clinical pharmacists at [facility] have begun to conduct these Wellness Visits which will also include a thorough review of all your medications.    As you primary medical provider recommend that you make an appointment for your Annual Wellness Visit if you have not done so already this year.  You may set up this appointment before you leave today or you may call back ([phone]) and schedule an appointment.  Please make sure when you call that you mention that you are scheduling your Annual Wellness Visit with the clinical pharmacist so that the appointment may be made for the  proper length of time.      Continue current medications. Continue good therapeutic lifestyle changes which include good diet and exercise. Fall precautions discussed with patient. If an FOBT was given today- please return it to our front desk. If you are over 79 years old - you may need Prevnar 15 or the adult Pneumonia vaccine.  Continue her followup with the neurosurgeon and the cardiologist as planned. Monitor blood pressures more closely at home especially when you have episodes of dizziness. Do not climb and put yourself at risk for falling If the monitor blood pressures run in the 39Q for the systolic readings please get back in touch with [place] We will call you with the results of the chest x-ray and lab work as soon as those results are available Return the FOBT   Arrie Senate MD

## 2013-09-16 ENCOUNTER — Telehealth: Payer: Self-pay

## 2013-09-16 LAB — BMP8+EGFR
BUN / CREAT RATIO: 28 — AB (ref 11–26)
BUN: 19 mg/dL (ref 8–27)
CHLORIDE: 102 mmol/L (ref 97–108)
CO2: 28 mmol/L (ref 18–29)
CREATININE: 0.68 mg/dL (ref 0.57–1.00)
Calcium: 9.8 mg/dL (ref 8.7–10.3)
GFR calc Af Amer: 97 mL/min/{1.73_m2} (ref >59)
GFR, EST NON AFRICAN AMERICAN: 84 mL/min/{1.73_m2} (ref >59)
GLUCOSE: 84 mg/dL (ref 65–99)
Potassium: 4.1 mmol/L (ref 3.5–5.2)
Sodium: 142 mmol/L (ref 134–144)

## 2013-09-16 LAB — NMR, LIPOPROFILE
CHOLESTEROL: 163 mg/dL (ref 100–199)
HDL Cholesterol by NMR: 72 mg/dL (ref >39)
HDL Particle Number: 36.8 umol/L (ref >=30.5)
LDL Particle Number: 852 nmol/L (ref <1000)
LDL Size: 20.6 nm (ref >20.5)
LDLC SERPL CALC-MCNC: 78 mg/dL (ref 0–99)
SMALL LDL PARTICLE NUMBER: 327 nmol/L (ref <=527)
Triglycerides by NMR: 63 mg/dL (ref 0–149)

## 2013-09-16 LAB — HEPATIC FUNCTION PANEL
ALBUMIN: 4.5 g/dL (ref 3.5–4.8)
ALT: 20 IU/L (ref 0–32)
AST: 25 IU/L (ref 0–40)
Alkaline Phosphatase: 37 IU/L — ABNORMAL LOW (ref 39–117)
BILIRUBIN DIRECT: 0.19 mg/dL (ref 0.00–0.40)
TOTAL PROTEIN: 6.8 g/dL (ref 6.0–8.5)
Total Bilirubin: 0.7 mg/dL (ref 0.0–1.2)

## 2013-09-16 LAB — VITAMIN D 25 HYDROXY (VIT D DEFICIENCY, FRACTURES): Vit D, 25-Hydroxy: 37 ng/mL (ref 30.0–100.0)

## 2013-09-16 NOTE — Telephone Encounter (Signed)
Pt aware of CXR results.

## 2013-09-16 NOTE — Telephone Encounter (Signed)
Message copied by Koren Bound on Thu Sep 16, 2013 12:46 PM ------      Message from: Chipper Herb      Created: Wed Sep 15, 2013  1:05 PM       As per radiology report ------

## 2013-09-22 DIAGNOSIS — M531 Cervicobrachial syndrome: Secondary | ICD-10-CM | POA: Diagnosis not present

## 2013-09-22 DIAGNOSIS — M542 Cervicalgia: Secondary | ICD-10-CM | POA: Diagnosis not present

## 2013-09-22 DIAGNOSIS — M5412 Radiculopathy, cervical region: Secondary | ICD-10-CM | POA: Diagnosis not present

## 2013-09-22 DIAGNOSIS — M47812 Spondylosis without myelopathy or radiculopathy, cervical region: Secondary | ICD-10-CM | POA: Diagnosis not present

## 2013-09-24 ENCOUNTER — Ambulatory Visit (INDEPENDENT_AMBULATORY_CARE_PROVIDER_SITE_OTHER): Payer: Medicare Other | Admitting: Family Medicine

## 2013-09-24 ENCOUNTER — Encounter: Payer: Self-pay | Admitting: Family Medicine

## 2013-09-24 VITALS — BP 114/58 | HR 58 | Temp 99.0°F | Ht 64.0 in | Wt 141.0 lb

## 2013-09-24 DIAGNOSIS — W57XXXA Bitten or stung by nonvenomous insect and other nonvenomous arthropods, initial encounter: Secondary | ICD-10-CM

## 2013-09-24 DIAGNOSIS — T148 Other injury of unspecified body region: Secondary | ICD-10-CM | POA: Diagnosis not present

## 2013-09-24 MED ORDER — DOXYCYCLINE HYCLATE 100 MG PO TABS: 100.0000 mg | ORAL_TABLET | Freq: Two times a day (BID) | ORAL | Status: DC

## 2013-09-24 MED ORDER — DOXYCYCLINE HYCLATE 100 MG PO TABS
100.0000 mg | ORAL_TABLET | Freq: Two times a day (BID) | ORAL | Status: DC
Start: 2013-09-24 — End: 2013-09-24

## 2013-09-24 NOTE — Progress Notes (Signed)
   Subjective:    Patient ID: Patricia Villa, female    DOB: 06-04-1932, 80 y.o.   MRN: [id number]  HPI Patient comes in today with complain of tick bite to left, upper, inner thigh. She removed the tick 2-3 days ago and has persistent redness and itching. The tick was flat when she removed it. Has applied Neosporin.   Review of Systems  Constitutional: Negative.   HENT: Negative.   Eyes: Negative.   Respiratory: Negative.   Cardiovascular: Negative.   Gastrointestinal: Negative.   Endocrine: Negative.   Genitourinary: Negative.   Musculoskeletal: Negative.   Skin: Negative.        Papule left upper, inner thigh  Allergic/Immunologic: Negative.   Neurological: Negative.   Hematological: Negative.   Psychiatric/Behavioral: Negative.        Objective:   Physical Exam  Nursing note and vitals reviewed. Constitutional: She is oriented to person, place, and time. She appears well-developed and well-nourished.  HENT:  Head: Normocephalic.  Eyes: Conjunctivae are normal. Pupils are equal, round, and reactive to light. Right eye exhibits no discharge.  Neck: Normal range of motion.  Musculoskeletal: Normal range of motion.  Neurological: She is alert and oriented to person, place, and time.  Skin: Skin is warm and dry. No rash noted. There is erythema.  Psychiatric: She has a normal mood and affect. Her behavior is normal. Judgment and thought content normal.    Papule left medial thigh with surrounding erythema.      Assessment & Plan:  Tick bite  Plan- Doxycycline 100mg  BID x 10 days. Take with food.  Keep area clean and dry.  Arrie Senate MD

## 2013-09-24 NOTE — Patient Instructions (Addendum)
Tick Bite Information Ticks are insects that attach themselves to the skin and draw blood for food. There are various types of ticks. Common types include wood ticks and deer ticks. Most ticks live in shrubs and grassy areas. Ticks can climb onto your body when you make contact with leaves or grass where the tick is waiting. The most common places on the body for ticks to attach themselves are the scalp, neck, armpits, waist, and groin. Most tick bites are harmless, but sometimes ticks carry germs that cause diseases. These germs can be spread to a person during the tick's feeding process. The chance of a disease spreading through a tick bite depends on:   The type of tick.  Time of year.   How long the tick is attached.   Geographic location.  HOW CAN YOU PREVENT TICK BITES? Take these steps to help prevent tick bites when you are outdoors:  Wear protective clothing. Long sleeves and long pants are best.   Wear white clothes so you can see ticks more easily.  Tuck your pant legs into your socks.   If walking on a trail, stay in the middle of the trail to avoid brushing against bushes.  Avoid walking through areas with long grass.  Put insect repellent on all exposed skin and along boot tops, pant legs, and sleeve cuffs.   Check clothing, hair, and skin repeatedly and before going inside.   Brush off any ticks that are not attached.  Take a shower or bath as soon as possible after being outdoors.  WHAT IS THE PROPER WAY TO REMOVE A TICK? Ticks should be removed as soon as possible to help prevent diseases caused by tick bites. 1. If latex gloves are available, put them on before trying to remove a tick.  2. Using fine-point tweezers, grasp the tick as close to the skin as possible. You may also use curved forceps or a tick removal tool. Grasp the tick as close to its head as possible. Avoid grasping the tick on its body. 3. Pull gently with steady upward pressure until  the tick lets go. Do not twist the tick or jerk it suddenly. This may break off the tick's head or mouth parts. 4. Do not squeeze or crush the tick's body. This could force disease-carrying fluids from the tick into your body.  5. After the tick is removed, wash the bite area and your hands with soap and water or other disinfectant such as alcohol. 6. Apply a small amount of antiseptic cream or ointment to the bite site.  7. Wash and disinfect any instruments that were used.  Do not try to remove a tick by applying a hot match, petroleum jelly, or fingernail polish to the tick. These methods do not work and may increase the chances of disease being spread from the tick bite.  WHEN SHOULD YOU SEEK MEDICAL CARE? Contact your health care provider if you are unable to remove a tick from your skin or if a part of the tick breaks off and is stuck in the skin.  After a tick bite, you need to be aware of signs and symptoms that could be related to diseases spread by ticks. Contact your health care provider if you develop any of the following in the days or weeks after the tick bite:  Unexplained fever.  Rash. A circular rash that appears days or weeks after the tick bite may indicate the possibility of Lyme disease. The rash may resemble   a target with a bull's-eye and may occur at a different part of your body than the tick bite.  Redness and swelling in the area of the tick bite.   Tender, swollen lymph glands.   Diarrhea.   Weight loss.   Cough.   Fatigue.   Muscle, joint, or bone pain.   Abdominal pain.   Headache.   Lethargy or a change in your level of consciousness.  Difficulty walking or moving your legs.   Numbness in the legs.   Paralysis.  Shortness of breath.   Confusion.   Repeated vomiting.  Document Released: 04/19/2000 Document Revised: 02/10/2013 Document Reviewed: 09/30/2012 Exit[employer] Patient Information 2014 [employer].  

## 2013-10-12 DIAGNOSIS — M533 Sacrococcygeal disorders, not elsewhere classified: Secondary | ICD-10-CM | POA: Diagnosis not present

## 2013-10-27 ENCOUNTER — Other Ambulatory Visit: Payer: Self-pay | Admitting: Family Medicine

## 2013-10-27 ENCOUNTER — Ambulatory Visit (INDEPENDENT_AMBULATORY_CARE_PROVIDER_SITE_OTHER): Payer: Medicare Other | Admitting: Cardiology

## 2013-10-27 ENCOUNTER — Encounter: Payer: Self-pay | Admitting: Cardiology

## 2013-10-27 VITALS — BP 130/58 | HR 54 | Ht 64.0 in | Wt 135.0 lb

## 2013-10-27 DIAGNOSIS — R002 Palpitations: Secondary | ICD-10-CM

## 2013-10-27 NOTE — Patient Instructions (Signed)
Please taper your Metoprolol then stop.  Take 1/2 tablet for 3 days then 1/4 tablet for 2 days then stop. Continue all other medications as listed.  Follow up in 1 year with Dr Percival Spanish.  You will receive a letter in the mail 2 months before you are due.  Please call us when you receive this letter to schedule your follow up appointment.

## 2013-10-27 NOTE — Progress Notes (Signed)
HPI The patient presents for yearly followup. Since I last saw her she has had no new cardiovascular complaints. she denies any new cardiovascular symptoms. She doesn't exercise routinely but she does some household chores. The patient denies any new symptoms such as chest discomfort, neck or arm discomfort. There has been no new shortness of breath, PND or orthopnea. There has been no presyncope or syncope.  However, she does have some rare palpitations she does some fatigue.  Allergies  Allergen Reactions  . Diphenhydramine Hcl Hives  . Livalo [Pitavastatin Calcium] Other (See Comments)    Pt doesn't remember reaction  . Sulfa Antibiotics Other (See Comments)    Pt doesn't remember reaction  . Zocor [Simvastatin] Other (See Comments)    Pt doesn't remember reaction    Current Outpatient Prescriptions  Medication Sig Dispense Refill  . acetaminophen (TYLENOL) 500 MG tablet Take 1,000 mg by mouth 2 (two) times daily as needed for pain.      Marland Kitchen amLODipine (NORVASC) 2.5 MG tablet TAKE ONE TABLET BY MOUTH ONE TIME DAILY  30 tablet  4  . aspirin EC 81 MG tablet Take 81 mg by mouth daily.      . Cholecalciferol (VITAMIN D3) 2000 UNITS TABS Take 2,000 Units by mouth daily.      . DULoxetine (CYMBALTA) 60 MG capsule TAKE ONE CAPSULE BY MOUTH ONE TIME DAILY  30 capsule  2  . estradiol (MINIVELLE) 0.05 MG/24HR patch Place 1 patch onto the skin 2 (two) times a week.      Marland Kitchen lisinopril (PRINIVIL,ZESTRIL) 40 MG tablet TAKE ONE TABLET BY MOUTH ONE TIME DAILY  30 tablet  2  . metoprolol succinate (TOPROL-XL) 50 MG 24 hr tablet TAKE ONE TABLET BY MOUTH ONE TIME DAILY  30 tablet  4  . Polyethyl Glycol-Propyl Glycol (SYSTANE OP) Place 1 drop into both eyes 2 (two) times daily as needed (itching).      . pravastatin (PRAVACHOL) 20 MG tablet Take 0.5 tablets (10 mg total) by mouth at bedtime.  30 tablet  6  . HYDROcodone-acetaminophen (NORCO/VICODIN) 5-325 MG per tablet Take 1 tablet by mouth every 8  (eight) hours.       No current facility-administered medications for this visit.    Past Medical History  Diagnosis Date  . Hypertrophic cardiomyopathy     by history but not identified on the most recent echo  . Mild mitral valve prolapse   . Mitral insufficiency     Mild  . Hypertension   . Meningioma   . Esotropia   . Hyperlipidemia   . Postmenopausal HRT (hormone replacement therapy)   . Arthritis   . Alopecia   . Diverticulosis of colon   . DDD (degenerative disc disease), cervical     Past Surgical History  Procedure Laterality Date  . Abdominal hysterectomy    . Cataract extraction, bilateral      ROS:  As stated in the HPI and negative for all other systems.  PHYSICAL EXAM BP 130/58  Pulse 54  Ht 5\' 4"  (1.626 m)  Wt 135 lb (61.236 kg)  BMI 23.16 kg/m2  LMP 08/01/1982 GENERAL:  Well appearing NECK:  No jugular venous distention, waveform within normal limits, right soft carotid bruits, no thyromegaly LUNGS:  Clear to auscultation bilaterally BACK:  No CVA tenderness CHEST:  Unremarkable HEART:  PMI not displaced or sustained,S1 and S2 within normal limits, no S3, no S4, no clicks, no rubs, apical holosystolic murmur 3/6 ABD:  Flat, positive bowel sounds normal in frequency in pitch, no bruits, no rebound, no guarding, no midline pulsatile mass, no hepatomegaly, no splenomegaly EXT:  2 plus pulses throughout, no edema, no cyanosis no clubbing, varicose veins  EKG:  Sinus rhythm, rate 47,  left axis deviation, first degree AV block, no significant change.  10/27/2013  ASSESSMENT AND PLAN  MITRAL INSUFFICIENCY:  The patient has no new symptoms. I followed this last year and will likely repeat an echo in one year.   CAROTID STENOSIS:  This was mild last year and will be followed in 2016  HTN:  Her blood pressure has been fine.  However, I am concerned that her heart rate is low. She will stop her beta blocker. She will presents in the a blood pressure diary.  I doubt that she will need further blood pressure medications however.

## 2013-11-02 DIAGNOSIS — M542 Cervicalgia: Secondary | ICD-10-CM | POA: Diagnosis not present

## 2013-11-02 DIAGNOSIS — M533 Sacrococcygeal disorders, not elsewhere classified: Secondary | ICD-10-CM | POA: Diagnosis not present

## 2013-11-08 ENCOUNTER — Telehealth: Payer: Self-pay | Admitting: Cardiology

## 2013-11-08 NOTE — Telephone Encounter (Signed)
Wants to talk to nurse regarding her BP medication.  Please call

## 2013-11-08 NOTE — Telephone Encounter (Signed)
Patient followed instruction tapering off Metoprolol.  Friday (she thinks) took BP with readings 160's/70-77 - heart rate 82-87.  Also noticed some palpitations.  Patient called Dr. Laurance Flatten and he had her restart 1/4 tablet Metoprolol (12.5mg ) daily.  BP's now running 130-140/60's with heart rates 60-69 and no symptoms of palpitations.  Wants to know if she should continue the Metoprolol.  Advised to continue as Dr. Laurance Flatten prescribed and a message will be sent to Dr. Percival Spanish and if he wants her to do anything different we will call and advise.  Patient voiced understanding.

## 2013-11-09 NOTE — Telephone Encounter (Signed)
Patient notified to continue low dose of Metoprolol as prescribed by Dr. Laurance Flatten per Dr. Percival Spanish.  Patient voiced undertanding.

## 2013-11-09 NOTE — Telephone Encounter (Signed)
Agree with continuing beta blocker at current dose

## 2013-11-10 ENCOUNTER — Other Ambulatory Visit: Payer: Self-pay | Admitting: Family Medicine

## 2013-11-23 DIAGNOSIS — M47812 Spondylosis without myelopathy or radiculopathy, cervical region: Secondary | ICD-10-CM | POA: Diagnosis not present

## 2013-12-21 DIAGNOSIS — M47812 Spondylosis without myelopathy or radiculopathy, cervical region: Secondary | ICD-10-CM | POA: Diagnosis not present

## 2013-12-21 DIAGNOSIS — M533 Sacrococcygeal disorders, not elsewhere classified: Secondary | ICD-10-CM | POA: Diagnosis not present

## 2013-12-28 ENCOUNTER — Encounter: Payer: Self-pay | Admitting: Family Medicine

## 2014-01-07 ENCOUNTER — Ambulatory Visit (INDEPENDENT_AMBULATORY_CARE_PROVIDER_SITE_OTHER): Payer: Medicare Other | Admitting: Family Medicine

## 2014-01-07 ENCOUNTER — Encounter: Payer: Self-pay | Admitting: Family Medicine

## 2014-01-07 VITALS — BP 128/66 | HR 75 | Temp 98.9°F | Ht 64.0 in | Wt 136.8 lb

## 2014-01-07 DIAGNOSIS — R350 Frequency of micturition: Secondary | ICD-10-CM | POA: Diagnosis not present

## 2014-01-07 LAB — POCT UA - MICROSCOPIC ONLY
Casts, Ur, LPF, POC: NEGATIVE
Crystals, Ur, HPF, POC: NEGATIVE
MUCUS UA: NEGATIVE
Yeast, UA: NEGATIVE

## 2014-01-07 LAB — POCT URINALYSIS DIPSTICK

## 2014-01-07 MED ORDER — CIPROFLOXACIN HCL 250 MG PO TABS: 250.0000 mg | ORAL_TABLET | Freq: Two times a day (BID) | ORAL | Status: DC

## 2014-01-07 NOTE — Patient Instructions (Signed)

## 2014-01-07 NOTE — Progress Notes (Signed)
   Subjective:    Patient ID: Patricia Villa, female    DOB: [birth year], 80 y.o.   MRN: [id number]  HPI 80 year old female with 2 day history of urinary frequency. Past history of UTI. She began taking Azo today with some relief of dysuria. She also began pushing fluids and that seems to have helped as well per history. There is no flank pain fever chills nausea or vomiting.    Review of Systems  Genitourinary: Positive for dysuria and frequency.  All other systems reviewed and are negative.      Objective:   Physical Exam  Constitutional: She is oriented to person, place, and time. She appears well-developed and well-nourished.  Eyes: Conjunctivae and EOM are normal.  Neck: Normal range of motion. Neck supple.  Cardiovascular: Normal rate, regular rhythm and normal heart sounds.   Pulmonary/Chest: Effort normal and breath sounds normal.  Abdominal: Soft. Bowel sounds are normal.  Musculoskeletal: Normal range of motion.  Neurological: She is alert and oriented to person, place, and time. She has normal reflexes.  Skin: Skin is warm and dry.  Psychiatric: She has a normal mood and affect. Her behavior is normal. Thought content normal.   BP 128/66  Pulse 75  Temp(Src) 98.9 F (37.2 C) (Oral)  Ht 5\' 4"  (1.626 m)  Wt 136 lb 12.8 oz (62.052 kg)  BMI 23.47 kg/m2  LMP 08/01/1982       Assessment & Plan:  1. Urinary frequency Urine has 20-30 WBC's/hpf.  Will do C&S and begin Cipro 250 mg BID x 5 days - POCT UA - Microscopic Only - POCT urinalysis dipstick - Urine culture  Wardell Honour MD

## 2014-01-08 LAB — URINE CULTURE

## 2014-01-11 DIAGNOSIS — Z9189 Other specified personal risk factors, not elsewhere classified: Secondary | ICD-10-CM | POA: Diagnosis not present

## 2014-01-11 DIAGNOSIS — Z124 Encounter for screening for malignant neoplasm of cervix: Secondary | ICD-10-CM | POA: Diagnosis not present

## 2014-01-12 ENCOUNTER — Telehealth: Payer: Self-pay | Admitting: Family Medicine

## 2014-01-12 NOTE — Telephone Encounter (Signed)
Patient aware.

## 2014-01-14 ENCOUNTER — Encounter: Payer: Self-pay | Admitting: *Deleted

## 2014-01-24 ENCOUNTER — Ambulatory Visit (INDEPENDENT_AMBULATORY_CARE_PROVIDER_SITE_OTHER): Payer: Medicare Other | Admitting: Family Medicine

## 2014-01-24 ENCOUNTER — Encounter: Payer: Self-pay | Admitting: Family Medicine

## 2014-01-24 VITALS — BP 130/66 | HR 76 | Temp 98.2°F | Ht 64.0 in | Wt 137.0 lb

## 2014-01-24 DIAGNOSIS — E785 Hyperlipidemia, unspecified: Secondary | ICD-10-CM | POA: Diagnosis not present

## 2014-01-24 DIAGNOSIS — M542 Cervicalgia: Secondary | ICD-10-CM | POA: Diagnosis not present

## 2014-01-24 DIAGNOSIS — E559 Vitamin D deficiency, unspecified: Secondary | ICD-10-CM

## 2014-01-24 DIAGNOSIS — M5030 Other cervical disc degeneration, unspecified cervical region: Secondary | ICD-10-CM | POA: Insufficient documentation

## 2014-01-24 DIAGNOSIS — I10 Essential (primary) hypertension: Secondary | ICD-10-CM

## 2014-01-24 DIAGNOSIS — Z888 Allergy status to other drugs, medicaments and biological substances status: Secondary | ICD-10-CM

## 2014-01-24 DIAGNOSIS — Z789 Other specified health status: Secondary | ICD-10-CM

## 2014-01-24 NOTE — Patient Instructions (Addendum)
Medicare Annual Wellness Visit  [facility] and the medical providers at [facility] strive to bring you the best medical care.  In doing so we not only want to address your current medical conditions and concerns but also to detect new conditions early and prevent illness, disease and health-related problems.    Medicare offers a yearly Wellness Visit which allows our clinical staff to assess your need for preventative services including immunizations, lifestyle education, counseling to decrease risk of preventable diseases and screening for fall risk and other medical concerns.    This visit is provided free of charge (no copay) for all Medicare recipients. The clinical pharmacists at [facility] have begun to conduct these Wellness Visits which will also include a thorough review of all your medications.    As you primary medical provider recommend that you make an appointment for your Annual Wellness Visit if you have not done so already this year.  You may set up this appointment before you leave today or you may call back ([phone]) and schedule an appointment.  Please make sure when you call that you mention that you are scheduling your Annual Wellness Visit with the clinical pharmacist so that the appointment may be made for the proper length of time.     Continue current medications. Continue good therapeutic lifestyle changes which include good diet and exercise. Fall precautions discussed with patient. If an FOBT was given today- please return it to our front desk. If you are over 16 years old - you may need Prevnar 69 or the adult Pneumonia vaccine.  Flu Shots will be available at our office starting mid- September. Please call and schedule a FLU CLINIC APPOINTMENT.   Keep appointment with neurosurgeon Use warm wet compresses to the neck or the [place] PAC Take Tylenol if needed for pain Make sure that you have a  comfortable low to sleep on

## 2014-01-24 NOTE — Progress Notes (Signed)
Subjective:    Patient ID: Patricia Villa, female    DOB: May 30, 1932, 80 y.o.   MRN: [id number]  HPI Pt here for follow up and management of chronic medical problems. The patient is having some neck pain. She is having some leg cramps and followup that the statin drug was affecting that. She stopped the pravastatin and she got better . She has an FOBT at home which she will return. She'll also return to the office fasting for lab work. She has an appointment to see the neurosurgeon on October 7.       Patient Active Problem List   Diagnosis Date Noted  . Hyperlipidemia 05/17/2013  . Colitis 01/20/2013  . Generalized anxiety disorder 08/05/2012  . Meningioma 08/05/2012  . Bruit 10/17/2010  . Essential hypertension, benign 10/04/2009  . PALPITATIONS 10/04/2009  . MITRAL INSUFFICIENCY 09/26/2009  . MITRAL VALVE PROLAPSE 09/26/2009  . Hypertrophic obstructive cardiomyopathy(425.11) 09/26/2009   Outpatient Encounter Prescriptions as of 01/24/2014  Medication Sig  . acetaminophen (TYLENOL) 500 MG tablet Take 1,000 mg by mouth 2 (two) times daily as needed for pain.  Marland Kitchen amLODipine (NORVASC) 2.5 MG tablet TAKE ONE TABLET BY MOUTH ONE TIME DAILY  . aspirin EC 81 MG tablet Take 81 mg by mouth daily.  . Cholecalciferol (VITAMIN D3) 2000 UNITS TABS Take 2,000 Units by mouth daily.  . DULoxetine (CYMBALTA) 60 MG capsule TAKE ONE CAPSULE BY MOUTH ONE TIME DAILY  . estradiol (MINIVELLE) 0.05 MG/24HR patch Place 1 patch onto the skin 2 (two) times a week.  Marland Kitchen HYDROcodone-acetaminophen (NORCO/VICODIN) 5-325 MG per tablet Take 1 tablet by mouth every 8 (eight) hours.  Marland Kitchen lisinopril (PRINIVIL,ZESTRIL) 40 MG tablet TAKE ONE TABLET BY MOUTH ONE  TIME DAILY  . Polyethyl Glycol-Propyl Glycol (SYSTANE OP) Place 1 drop into both eyes 2 (two) times daily as needed (itching).  . [DISCONTINUED] pravastatin (PRAVACHOL) 20 MG tablet Take 0.5 tablets (10 mg total) by mouth at bedtime.  . [DISCONTINUED]  ciprofloxacin (CIPRO) 250 MG tablet Take 1 tablet (250 mg total) by mouth 2 (two) times daily.    Review of Systems  Constitutional: Negative.   HENT: Negative.   Eyes: Negative.   Respiratory: Negative.   Cardiovascular: Negative.   Gastrointestinal: Negative.   Endocrine: Negative.   Genitourinary: Negative.   Musculoskeletal: Positive for neck pain.       Muscle cramps in legs- better since stopping cholesterol med.  Skin: Negative.   Allergic/Immunologic: Negative.   Neurological: Negative.   Hematological: Negative.   Psychiatric/Behavioral: Negative.        Objective:   Physical Exam  Nursing note and vitals reviewed. Constitutional: She is oriented to person, place, and time. No distress.  Elderly but alert and pleasant  HENT:  Head: Normocephalic and atraumatic.  Right Ear: External ear normal.  Left Ear: External ear normal.  Nose: Nose normal.  Mouth/Throat: Oropharynx is clear and moist. No oropharyngeal exudate.  Eyes: Conjunctivae and EOM are normal. Pupils are equal, round, and reactive to light. Right eye exhibits no discharge. Left eye exhibits no discharge. No scleral icterus.  Neck: Normal range of motion. Neck supple. No thyromegaly present.  Cardiovascular: Normal rate, regular rhythm and intact distal pulses.  Exam reveals no gallop and no friction rub.   Murmur heard. At 72 per minute, grade 3/6 systolic ejection murmur  Pulmonary/Chest: Effort normal and breath sounds normal. No respiratory distress. She has no wheezes. She has no rales. She exhibits no tenderness.  Abdominal: Soft. Bowel sounds are normal. She exhibits no mass. There is no tenderness. There is no rebound and no guarding.  Musculoskeletal: Normal range of motion. She exhibits tenderness (the neck is tender with movement and pain for the). She exhibits no edema.  Lymphadenopathy:    She has no cervical adenopathy.  Neurological: She is alert and oriented to person, place, and time. She  has normal reflexes. No cranial nerve deficit.  The reflexes were equal in both upper and lower extremities  Skin: Skin is warm and dry. No rash noted.  Psychiatric: She has a normal mood and affect. Her behavior is normal. Judgment and thought content normal.   BP 130/66  Pulse 76  Temp(Src) 98.2 F (36.8 C) (Oral)  Ht 5' 4"  (1.626 m)  Wt 137 lb (62.143 kg)  BMI 23.50 kg/m2  LMP 08/01/1982        Assessment & Plan:  1. Essential hypertension, benign - POCT CBC; Future - BMP8+EGFR; Future - Hepatic function panel; Future  2. Hyperlipidemia - POCT CBC; Future - NMR, lipoprofile; Future  3. Vitamin D deficiency - Vit D  25 hydroxy (rtn osteoporosis monitoring); Future  4. Neck pain on right side  5. DDD (degenerative disc disease), cervical  6. Statin intolerance  Meds ordered this encounter  Medications  . metoprolol succinate (TOPROL-XL) 12.5 mg TB24 24 hr tablet    Sig: Take 12.5 mg by mouth daily. Takes 1/4 of 50 mg daily   Patient Instructions                       Medicare Annual Wellness Visit  [facility] and the medical providers at [facility] strive to bring you the best medical care.  In doing so we not only want to address your current medical conditions and concerns but also to detect new conditions early and prevent illness, disease and health-related problems.    Medicare offers a yearly Wellness Visit which allows our clinical staff to assess your need for preventative services including immunizations, lifestyle education, counseling to decrease risk of preventable diseases and screening for fall risk and other medical concerns.    This visit is provided free of charge (no copay) for all Medicare recipients. The clinical pharmacists at [facility] have begun to conduct these Wellness Visits which will also include a thorough review of all your medications.    As you primary medical provider recommend that  you make an appointment for your Annual Wellness Visit if you have not done so already this year.  You may set up this appointment before you leave today or you may call back [phone]) and schedule an appointment.  Please make sure when you call that you mention that you are scheduling your Annual Wellness Visit with the clinical pharmacist so that the appointment may be made for the proper length of time.     Continue current medications. Continue good therapeutic lifestyle changes which include good diet and exercise. Fall precautions discussed with patient. If an FOBT was given today- please return it to our front desk. If you are over 32 years old - you may need Prevnar 47 or the adult Pneumonia vaccine.  Flu Shots will be available at our office starting mid- September. Please call and schedule a FLU CLINIC APPOINTMENT.   Keep appointment with neurosurgeon Use warm wet compresses to the neck or the [place] PAC Take Tylenol if needed for pain Make sure that  you have a comfortable low to sleep on   Arrie Senate MD

## 2014-01-25 ENCOUNTER — Ambulatory Visit: Payer: Medicare Other | Admitting: Family Medicine

## 2014-01-27 ENCOUNTER — Other Ambulatory Visit (INDEPENDENT_AMBULATORY_CARE_PROVIDER_SITE_OTHER): Payer: Medicare Other

## 2014-01-27 DIAGNOSIS — E559 Vitamin D deficiency, unspecified: Secondary | ICD-10-CM

## 2014-01-27 DIAGNOSIS — E785 Hyperlipidemia, unspecified: Secondary | ICD-10-CM | POA: Diagnosis not present

## 2014-01-27 DIAGNOSIS — I10 Essential (primary) hypertension: Secondary | ICD-10-CM | POA: Diagnosis not present

## 2014-01-27 LAB — POCT CBC
Granulocyte percent: 59.5 %G (ref 37–80)
HCT, POC: 37.9 % (ref 37.7–47.9)
HEMOGLOBIN: 12.8 g/dL (ref 12.2–16.2)
Lymph, poc: 2.1 (ref 0.6–3.4)
MCH: 33.1 pg — AB (ref 27–31.2)
MCHC: 33.8 g/dL (ref 31.8–35.4)
MCV: 97.8 fL — AB (ref 80–97)
MPV: 9.2 fL (ref 0–99.8)
POC Granulocyte: 3.6 (ref 2–6.9)
POC LYMPH PERCENT: 33.8 %L (ref 10–50)
Platelet Count, POC: 231 10*3/uL (ref 142–424)
RBC: 3.9 M/uL — AB (ref 4.04–5.48)
RDW, POC: 12.9 %
WBC: 6.1 10*3/uL (ref 4.6–10.2)

## 2014-01-28 LAB — BMP8+EGFR
BUN / CREAT RATIO: 17 (ref 11–26)
BUN: 14 mg/dL (ref 8–27)
CALCIUM: 9.8 mg/dL (ref 8.7–10.3)
CO2: 27 mmol/L (ref 18–29)
CREATININE: 0.81 mg/dL (ref 0.57–1.00)
Chloride: 98 mmol/L (ref 97–108)
GFR calc Af Amer: 80 mL/min/{1.73_m2} (ref >59)
GFR calc non Af Amer: 69 mL/min/{1.73_m2} (ref >59)
Glucose: 106 mg/dL — ABNORMAL HIGH (ref 65–99)
Potassium: 4.2 mmol/L (ref 3.5–5.2)
SODIUM: 140 mmol/L (ref 134–144)

## 2014-01-28 LAB — NMR, LIPOPROFILE
Cholesterol: 210 mg/dL — ABNORMAL HIGH (ref 100–199)
HDL Cholesterol by NMR: 73 mg/dL (ref >39)
HDL Particle Number: 35.3 umol/L (ref >=30.5)
LDL Particle Number: 1304 nmol/L — ABNORMAL HIGH (ref <1000)
LDL SIZE: 21.1 nm (ref >20.5)
LDLC SERPL CALC-MCNC: 119 mg/dL — ABNORMAL HIGH (ref 0–99)
LP-IR Score: <25 (ref <=45)
SMALL LDL PARTICLE NUMBER: 270 nmol/L (ref <=527)
TRIGLYCERIDES BY NMR: 92 mg/dL (ref 0–149)

## 2014-01-28 LAB — HEPATIC FUNCTION PANEL
ALT: 18 IU/L (ref 0–32)
AST: 25 IU/L (ref 0–40)
Albumin: 4.6 g/dL (ref 3.5–4.8)
Alkaline Phosphatase: 42 IU/L (ref 39–117)
BILIRUBIN TOTAL: 0.8 mg/dL (ref 0.0–1.2)
Bilirubin, Direct: 0.2 mg/dL (ref 0.00–0.40)
Total Protein: 7.1 g/dL (ref 6.0–8.5)

## 2014-01-28 LAB — VITAMIN D 25 HYDROXY (VIT D DEFICIENCY, FRACTURES): Vit D, 25-Hydroxy: 49 ng/mL (ref 30.0–100.0)

## 2014-01-29 ENCOUNTER — Other Ambulatory Visit: Payer: Self-pay | Admitting: Family Medicine

## 2014-02-09 DIAGNOSIS — M47813 Spondylosis without myelopathy or radiculopathy, cervicothoracic region: Secondary | ICD-10-CM | POA: Diagnosis not present

## 2014-02-18 ENCOUNTER — Other Ambulatory Visit: Payer: Medicare Other

## 2014-02-18 DIAGNOSIS — M47812 Spondylosis without myelopathy or radiculopathy, cervical region: Secondary | ICD-10-CM | POA: Diagnosis not present

## 2014-02-18 DIAGNOSIS — Z1212 Encounter for screening for malignant neoplasm of rectum: Secondary | ICD-10-CM | POA: Diagnosis not present

## 2014-02-18 DIAGNOSIS — M47813 Spondylosis without myelopathy or radiculopathy, cervicothoracic region: Secondary | ICD-10-CM | POA: Diagnosis not present

## 2014-02-18 DIAGNOSIS — M542 Cervicalgia: Secondary | ICD-10-CM | POA: Diagnosis not present

## 2014-02-18 NOTE — Progress Notes (Signed)
Lab only 

## 2014-02-20 LAB — FECAL OCCULT BLOOD, IMMUNOCHEMICAL: Fecal Occult Bld: NEGATIVE

## 2014-03-04 DIAGNOSIS — M542 Cervicalgia: Secondary | ICD-10-CM | POA: Diagnosis not present

## 2014-03-04 DIAGNOSIS — M47813 Spondylosis without myelopathy or radiculopathy, cervicothoracic region: Secondary | ICD-10-CM | POA: Diagnosis not present

## 2014-03-08 ENCOUNTER — Other Ambulatory Visit: Payer: Self-pay | Admitting: Family Medicine

## 2014-03-10 ENCOUNTER — Ambulatory Visit (INDEPENDENT_AMBULATORY_CARE_PROVIDER_SITE_OTHER): Payer: Medicare Other | Admitting: *Deleted

## 2014-03-10 ENCOUNTER — Ambulatory Visit: Payer: Medicare Other | Admitting: *Deleted

## 2014-03-10 DIAGNOSIS — Z23 Encounter for immunization: Secondary | ICD-10-CM

## 2014-03-15 ENCOUNTER — Ambulatory Visit: Payer: Medicare Other

## 2014-04-18 ENCOUNTER — Encounter (HOSPITAL_COMMUNITY): Payer: Self-pay | Admitting: *Deleted

## 2014-04-18 ENCOUNTER — Emergency Department (HOSPITAL_COMMUNITY)
Admission: EM | Admit: 2014-04-18 | Discharge: 2014-04-19 | Disposition: A | Discharge status: Home / Self Care | Payer: Medicare Other | Attending: Emergency Medicine | Admitting: Emergency Medicine

## 2014-04-18 ENCOUNTER — Emergency Department (HOSPITAL_COMMUNITY): Payer: Medicare Other

## 2014-04-18 DIAGNOSIS — M1990 Unspecified osteoarthritis, unspecified site: Secondary | ICD-10-CM | POA: Insufficient documentation

## 2014-04-18 DIAGNOSIS — Z8673 Personal history of transient ischemic attack (TIA), and cerebral infarction without residual deficits: Secondary | ICD-10-CM | POA: Diagnosis not present

## 2014-04-18 DIAGNOSIS — R55 Syncope and collapse: Secondary | ICD-10-CM | POA: Insufficient documentation

## 2014-04-18 DIAGNOSIS — K2970 Gastritis, unspecified, without bleeding: Secondary | ICD-10-CM | POA: Diagnosis not present

## 2014-04-18 DIAGNOSIS — Z7982 Long term (current) use of aspirin: Secondary | ICD-10-CM | POA: Diagnosis not present

## 2014-04-18 DIAGNOSIS — R109 Unspecified abdominal pain: Secondary | ICD-10-CM | POA: Diagnosis not present

## 2014-04-18 DIAGNOSIS — I10 Essential (primary) hypertension: Secondary | ICD-10-CM | POA: Insufficient documentation

## 2014-04-18 DIAGNOSIS — R112 Nausea with vomiting, unspecified: Secondary | ICD-10-CM | POA: Diagnosis not present

## 2014-04-18 DIAGNOSIS — R110 Nausea: Secondary | ICD-10-CM | POA: Diagnosis not present

## 2014-04-18 DIAGNOSIS — Z90710 Acquired absence of both cervix and uterus: Secondary | ICD-10-CM | POA: Diagnosis not present

## 2014-04-18 DIAGNOSIS — Z79899 Other long term (current) drug therapy: Secondary | ICD-10-CM | POA: Insufficient documentation

## 2014-04-18 DIAGNOSIS — K5900 Constipation, unspecified: Secondary | ICD-10-CM

## 2014-04-18 DIAGNOSIS — Z86018 Personal history of other benign neoplasm: Secondary | ICD-10-CM | POA: Diagnosis not present

## 2014-04-18 DIAGNOSIS — Z8639 Personal history of other endocrine, nutritional and metabolic disease: Secondary | ICD-10-CM | POA: Diagnosis not present

## 2014-04-18 DIAGNOSIS — Z8669 Personal history of other diseases of the nervous system and sense organs: Secondary | ICD-10-CM | POA: Insufficient documentation

## 2014-04-18 DIAGNOSIS — Z872 Personal history of diseases of the skin and subcutaneous tissue: Secondary | ICD-10-CM | POA: Insufficient documentation

## 2014-04-18 MED ORDER — DICYCLOMINE HCL 20 MG PO TABS
10.0000 mg | ORAL_TABLET | Freq: Once | ORAL | Status: AC
  Administered 2014-04-18: 10 mg via ORAL
  Filled 2014-04-18: qty 1

## 2014-04-18 NOTE — ED Notes (Addendum)
Per EMS, pt went to have a bowel movement today and began feeling nauseas, vomited, and felt diaphoretic. Pt states she began to feel better when she arrived to the hospital. Pt had a bowel movement upon arrival to hospital, which she states has made her feel better. Pt took miralax at 10am today, which she states she takes 2-3 times a week.

## 2014-04-18 NOTE — ED Notes (Signed)
Patient transported to X-ray 

## 2014-04-18 NOTE — ED Notes (Signed)
Bed: WA21 Expected date:  Expected time:  Means of arrival:  Comments: EMS  

## 2014-04-19 MED ORDER — HYDROCODONE-ACETAMINOPHEN 5-325 MG PO TABS: 1.0000 | ORAL_TABLET | Freq: Once | ORAL | Status: DC

## 2014-04-19 MED ORDER — DOCUSATE SODIUM 100 MG PO CAPS: 100.0000 mg | ORAL_CAPSULE | Freq: Two times a day (BID) | ORAL | Status: DC

## 2014-04-19 MED ORDER — ACETAMINOPHEN 325 MG PO TABS
650.0000 mg | ORAL_TABLET | Freq: Once | ORAL | Status: AC
  Administered 2014-04-19: 650 mg via ORAL
  Filled 2014-04-19: qty 2

## 2014-04-19 NOTE — ED Provider Notes (Signed)
CSN: [id number]     Arrival date & time 04/18/14  2221 History   First MD Initiated Contact with Patient 04/18/14 2311     Chief Complaint  Patient presents with  . Nausea  . Emesis      HPI  Vitamins reevaluation. She was having a bowel movement tonight. She started feeling some cramping. Felt sweaty and nauseated. This improved. Upon arrival here she has a bowel movement states she feels "fine". These episodes of it happening intermittently for the last few years primarily with defecation. No syncope. No chest pain. Asymptomatic now. Takes hydrocodone nightly for neck pain.  Past Medical History  Diagnosis Date  . Hypertrophic cardiomyopathy     by history but not identified on the most recent echo  . Mild mitral valve prolapse   . Mitral insufficiency     Mild  . Hypertension   . Meningioma   . Esotropia   . Hyperlipidemia   . Postmenopausal HRT (hormone replacement therapy)   . Arthritis   . Alopecia   . Diverticulosis of colon   . DDD (degenerative disc disease), cervical    Past Surgical History  Procedure Laterality Date  . Abdominal hysterectomy    . Cataract extraction, bilateral     Family History  Problem Relation Age of Onset  . Other Brother     IHSS   History  Substance Use Topics  . Smoking status: Never Smoker   . Smokeless tobacco: Not on file     Comment: Rare previously  . Alcohol Use: No   OB History    No data available     Review of Systems  Constitutional: Positive for diaphoresis. Negative for fever, chills, appetite change and fatigue.  HENT: Negative for mouth sores, sore throat and trouble swallowing.   Eyes: Negative for visual disturbance.  Respiratory: Negative for cough, chest tightness, shortness of breath and wheezing.   Cardiovascular: Negative for chest pain.  Gastrointestinal: Positive for nausea and constipation. Negative for vomiting, abdominal pain, diarrhea and abdominal distention.  Endocrine: Negative for  polydipsia, polyphagia and polyuria.  Genitourinary: Negative for dysuria, frequency and hematuria.  Musculoskeletal: Negative for gait problem.  Skin: Negative for color change, pallor and rash.  Neurological: Negative for dizziness, syncope, light-headedness and headaches.  Hematological: Does not bruise/bleed easily.  Psychiatric/Behavioral: Negative for behavioral problems and confusion.      Allergies  Diphenhydramine hcl; Livalo; Pravastatin; Sulfa antibiotics; and Zocor  Home Medications   Prior to Admission medications   Medication Sig Start Date End Date Taking? Authorizing Provider  amLODipine (NORVASC) 2.5 MG tablet TAKE ONE TABLET BY MOUTH ONE TIME DAILY 03/09/14  Yes Chipper Herb, MD  aspirin EC 81 MG tablet Take 81 mg by mouth daily.   Yes Historical Provider, MD  Cholecalciferol (VITAMIN D3) 2000 UNITS TABS Take 2,000 Units by mouth daily.   Yes Historical Provider, MD  DULoxetine (CYMBALTA) 60 MG capsule TAKE ONE CAPSULE BY MOUTH ONE TIME DAILY 01/31/14  Yes Chipper Herb, MD  estradiol (MINIVELLE) 0.05 MG/24HR patch Place 1 patch onto the skin 2 (two) times a week. Tuesday & Saturday.   Yes Historical Provider, MD  HYDROcodone-acetaminophen (NORCO/VICODIN) 5-325 MG per tablet Take 1 tablet by mouth every 8 (eight) hours. 03/26/13  Yes Mary-Margaret Hassell Done, FNP  lisinopril (PRINIVIL,ZESTRIL) 40 MG tablet TAKE ONE TABLET BY MOUTH ONE  TIME DAILY   Yes Chipper Herb, MD  metoprolol succinate (TOPROL-XL) 12.5 mg TB24 24 hr tablet Take  12.5 mg by mouth daily. Takes 1/4 of 50 mg daily   Yes Historical Provider, MD  polyethylene glycol (MIRALAX / GLYCOLAX) packet Take 17 g by mouth daily as needed (for constipation.).   Yes Historical Provider, MD  docusate sodium (COLACE) 100 MG capsule Take 1 capsule (100 mg total) by mouth every 12 (twelve) hours. 04/19/14   Tanna Furry, MD   BP 149/85 mmHg  Pulse 64  Temp(Src) 97.6 F (36.4 C) (Oral)  Resp 18  SpO2 99%  LMP  08/01/1982 Physical Exam  Constitutional: She is oriented to person, place, and time. She appears well-developed and well-nourished. No distress.  HENT:  Head: Normocephalic.  Eyes: Conjunctivae are normal. Pupils are equal, round, and reactive to light. No scleral icterus.  Neck: Normal range of motion. Neck supple. No thyromegaly present.  Cardiovascular: Normal rate and regular rhythm.  Exam reveals no gallop and no friction rub.   No murmur heard. Pulmonary/Chest: Effort normal and breath sounds normal. No respiratory distress. She has no wheezes. She has no rales.  Abdominal: Soft. Bowel sounds are normal. She exhibits no distension. There is no tenderness. There is no rebound.    Plan abdomen. Normal active bowel sounds. No focal tenderness.  Musculoskeletal: Normal range of motion.  Neurological: She is alert and oriented to person, place, and time.  Skin: Skin is warm and dry. No rash noted.  Psychiatric: She has a normal mood and affect. Her behavior is normal.    ED Course  Procedures (including critical care time) Labs Review Labs Reviewed - No data to display  Imaging Review No results found.   EKG Interpretation None      MDM   Final diagnoses:  Abdominal pain  Constipation, unspecified constipation type  Vaso vagal episode    Patient remains a symptomatically here. Has another bowel movement in the department. No additional episodes of vasovagal. Benign abdomen. Fair amount of distal colonic stool. Overall not an obstructive pattern or diffuse stool burden. Normal resting EKG. She requests Vicodin for "my neck". I've asked her to speak with her primary care physician about alternative to Vicodin for her chronic neck pain as I think this is concerning to her constipation. I think her constipation is getting her vagal episodes with defecation.    Tanna Furry, MD 04/19/14 [phone]

## 2014-04-19 NOTE — Discharge Instructions (Signed)
Pain medications are quite constipating.  I recommend you speak with your primary care physician about an alternative to your daily Vicodin. Colace as a stool softener daily.  Constipation Constipation is when a person:  Poops (has a bowel movement) less than 3 times a week.  Has a hard time pooping.  Has poop that is dry, hard, or bigger than normal. HOME CARE   Eat foods with a lot of fiber in them. This includes fruits, vegetables, beans, and whole grains such as brown rice.  Avoid fatty foods and foods with a lot of sugar. This includes french fries, hamburgers, cookies, candy, and soda.  If you are not getting enough fiber from food, take products with added fiber in them (supplements).  Drink enough fluid to keep your pee (urine) clear or pale yellow.  Exercise on a regular basis, or as told by your doctor.  Go to the restroom when you feel like you need to poop. Do not hold it.  Only take medicine as told by your doctor. Do not take medicines that help you poop (laxatives) without talking to your doctor first. GET HELP RIGHT AWAY IF:   You have bright red blood in your poop (stool).  Your constipation lasts more than 4 days or gets worse.  You have belly (abdominal) or butt (rectal) pain.  You have thin poop (as thin as a pencil).  You lose weight, and it cannot be explained. MAKE SURE YOU:   Understand these instructions.  Will watch your condition.  Will get help right away if you are not doing well or get worse. Document Released: 10/09/2007 Document Revised: 04/27/2013 Document Reviewed: 02/01/2013 [facility] Patient Information 2015 [place], [place]. This information is not intended to replace advice given to you by your health care provider. Make sure you discuss any questions you have with your health care provider.  Vasovagal Syncope, Adult Syncope, commonly known as fainting, is a temporary loss of consciousness. It occurs when the blood flow to the brain  is reduced. Vasovagal syncope (also called neurocardiogenic syncope) is a fainting spell in which the blood flow to the brain is reduced because of a sudden drop in heart rate and blood pressure. Vasovagal syncope occurs when the brain and the cardiovascular system (blood vessels) do not adequately communicate and respond to each other. This is the most common cause of fainting. It often occurs in response to fear or some other type of emotional or physical stress. The body has a reaction in which the heart starts beating too slowly or the blood vessels expand, reducing blood pressure. This type of fainting spell is generally considered harmless. However, injuries can occur if a person takes a sudden fall during a fainting spell.  CAUSES  Vasovagal syncope occurs when a person's blood pressure and heart rate decrease suddenly, usually in response to a trigger. Many things and situations can trigger an episode. Some of these include:   Pain.   Fear.   The sight of blood or medical procedures, such as blood being drawn from a vein.   Common activities, such as coughing, swallowing, stretching, or going to the bathroom.   Emotional stress.   Prolonged standing, especially in a warm environment.   Lack of sleep or rest.   Prolonged lack of food.   Prolonged lack of fluids.   Recent illness.  The use of certain drugs that affect blood pressure, such as cocaine, alcohol, marijuana, inhalants, and opiates.  SYMPTOMS  Before the fainting episode,  you may:   Feel dizzy or light headed.   Become pale.  Sense that you are going to faint.   Feel like the room is spinning.   Have tunnel vision, only seeing directly in front of you.   Feel sick to your stomach (nauseous).   See spots or slowly lose vision.   Hear ringing in your ears.   Have a headache.   Feel warm and sweaty.   Feel a sensation of pins and needles. During the fainting spell, you will generally  be unconscious for no longer than a couple minutes before waking up and returning to normal. If you get up too quickly before your body can recover, you may faint again. Some twitching or jerky movements may occur during the fainting spell.  DIAGNOSIS  Your caregiver will ask about your symptoms, take a medical history, and perform a physical exam. Various tests may be done to rule out other causes of fainting. These may include blood tests and tests to check the heart, such as electrocardiography, echocardiography, and possibly an electrophysiology study. When other causes have been ruled out, a test may be done to check the body's response to changes in position (tilt table test). TREATMENT  Most cases of vasovagal syncope do not require treatment. Your caregiver may recommend ways to avoid fainting triggers and may provide home strategies for preventing fainting. If you must be exposed to a possible trigger, you can drink additional fluids to help reduce your chances of having an episode of vasovagal syncope. If you have warning signs of an oncoming episode, you can respond by positioning yourself favorably (lying down). If your fainting spells continue, you may be given medicines to prevent fainting. Some medicines may help make you more resistant to repeated episodes of vasovagal syncope. Special exercises or compression stockings may be recommended. In rare cases, the surgical placement of a pacemaker is considered. HOME CARE INSTRUCTIONS   Learn to identify the warning signs of vasovagal syncope.   Sit or lie down at the first warning sign of a fainting spell. If sitting, put your head down between your legs. If you lie down, swing your legs up in the air to increase blood flow to the brain.   Avoid hot tubs and saunas.  Avoid prolonged standing.  Drink enough fluids to keep your urine clear or pale yellow. Avoid caffeine.  Increase salt in your diet as directed by your caregiver.   If  you have to stand for a long time, perform movements such as:   Crossing your legs.   Flexing and stretching your leg muscles.   Squatting.   Moving your legs.   Bending over.   Only take over-the-counter or prescription medicines as directed by your caregiver. Do not suddenly stop any medicines without asking your caregiver first. SEEK MEDICAL CARE IF:   Your fainting spells continue or happen more frequently in spite of treatment.   You lose consciousness for more than a couple minutes.  You have fainting spells during or after exercising or after being startled.   You have new symptoms that occur with the fainting spells, such as:   Shortness of breath.  Chest pain.   Irregular heartbeat.   You have episodes of twitching or jerky movements that last longer than a few seconds.  You have episodes of twitching or jerky movements without obvious fainting. SEEK IMMEDIATE MEDICAL CARE IF:   You have injuries or bleeding after a fainting spell.   You  have episodes of twitching or jerky movements that last longer than 5 minutes.   You have more than one spell of twitching or jerky movements before returning to consciousness after fainting. MAKE SURE YOU:   Understand these instructions.  Will watch your condition.  Will get help right away if you are not doing well or get worse. Document Released: 04/08/2012 Document Reviewed: 04/08/2012 [facility] Patient Information 2015 [place]. This information is not intended to replace advice given to you by your health care provider. Make sure you discuss any questions you have with your health care provider.

## 2014-04-27 ENCOUNTER — Other Ambulatory Visit: Payer: Self-pay | Admitting: Family Medicine

## 2014-05-17 DIAGNOSIS — M5481 Occipital neuralgia: Secondary | ICD-10-CM | POA: Diagnosis not present

## 2014-05-17 DIAGNOSIS — M47813 Spondylosis without myelopathy or radiculopathy, cervicothoracic region: Secondary | ICD-10-CM | POA: Diagnosis not present

## 2014-05-17 DIAGNOSIS — M542 Cervicalgia: Secondary | ICD-10-CM | POA: Diagnosis not present

## 2014-06-07 ENCOUNTER — Ambulatory Visit (INDEPENDENT_AMBULATORY_CARE_PROVIDER_SITE_OTHER): Payer: Medicare Other | Admitting: Family Medicine

## 2014-06-07 ENCOUNTER — Encounter: Payer: Self-pay | Admitting: Family Medicine

## 2014-06-07 VITALS — BP 109/60 | HR 84 | Temp 97.1°F | Ht 64.0 in | Wt 136.0 lb

## 2014-06-07 DIAGNOSIS — K5909 Other constipation: Secondary | ICD-10-CM

## 2014-06-07 DIAGNOSIS — I10 Essential (primary) hypertension: Secondary | ICD-10-CM | POA: Diagnosis not present

## 2014-06-07 DIAGNOSIS — E785 Hyperlipidemia, unspecified: Secondary | ICD-10-CM

## 2014-06-07 DIAGNOSIS — Z889 Allergy status to unspecified drugs, medicaments and biological substances status: Secondary | ICD-10-CM | POA: Diagnosis not present

## 2014-06-07 DIAGNOSIS — E559 Vitamin D deficiency, unspecified: Secondary | ICD-10-CM | POA: Diagnosis not present

## 2014-06-07 DIAGNOSIS — M542 Cervicalgia: Secondary | ICD-10-CM | POA: Diagnosis not present

## 2014-06-07 DIAGNOSIS — I080 Rheumatic disorders of both mitral and aortic valves: Secondary | ICD-10-CM | POA: Diagnosis not present

## 2014-06-07 DIAGNOSIS — G8929 Other chronic pain: Secondary | ICD-10-CM | POA: Diagnosis not present

## 2014-06-07 DIAGNOSIS — Z789 Other specified health status: Secondary | ICD-10-CM

## 2014-06-07 LAB — POCT CBC
Granulocyte percent: 53.9 %G (ref 37–80)
HEMATOCRIT: 38.9 % (ref 37.7–47.9)
Hemoglobin: 11.9 g/dL — AB (ref 12.2–16.2)
LYMPH, POC: 2.1 (ref 0.6–3.4)
MCH, POC: 30.3 pg (ref 27–31.2)
MCHC: 30.6 g/dL — AB (ref 31.8–35.4)
MCV: 98.8 fL — AB (ref 80–97)
MPV: 9.7 fL (ref 0–99.8)
POC Granulocyte: 2.8 (ref 2–6.9)
POC LYMPH PERCENT: 39.6 %L (ref 10–50)
Platelet Count, POC: 209 10*3/uL (ref 142–424)
RBC: 3.9 M/uL — AB (ref 4.04–5.48)
RDW, POC: 12.7 %
WBC: 5.2 10*3/uL (ref 4.6–10.2)

## 2014-06-07 MED ORDER — AMLODIPINE BESYLATE 2.5 MG PO TABS: 2.5000 mg | ORAL_TABLET | Freq: Every day | ORAL | Status: DC

## 2014-06-07 MED ORDER — METOPROLOL SUCCINATE ER 25 MG PO TB24: 25.0000 mg | ORAL_TABLET | Freq: Every day | ORAL | Status: DC

## 2014-06-07 NOTE — Progress Notes (Signed)
Subjective:    Patient ID: Patricia Villa, female    DOB: June 25, 1932, 81 y.o.   MRN: [id number]  HPI Pt here for follow up and management of chronic medical problems which includes hypertension and hyperlipidemia. She is taking medications regularly. The patient is still being followed by Dr. Maryjean Ka for her neck pain. She also continues to complain of constipation. She is requesting refills on her medication today. She is also due to get lab work and is scheduled for a DEXA scan. She gets her pelvic exams done with Dr. Nori Riis and her mammogram done with Dr. Nori Riis and this is being worked out by the patient. The patient gets her pelvic exam done in August and she has her mammogram coming up soon this month. She also has an appointment with Dr. Maryjean Ka for further injections in her neck and that will be in 2 days.       Patient Active Problem List   Diagnosis Date Noted  . DDD (degenerative disc disease), cervical 01/24/2014  . Hyperlipidemia 05/17/2013  . Colitis 01/20/2013  . Generalized anxiety disorder 08/05/2012  . Meningioma 08/05/2012  . Bruit 10/17/2010  . Essential hypertension, benign 10/04/2009  . PALPITATIONS 10/04/2009  . MITRAL INSUFFICIENCY 09/26/2009  . MITRAL VALVE PROLAPSE 09/26/2009  . Hypertrophic obstructive cardiomyopathy(425.11) 09/26/2009   Outpatient Encounter Prescriptions as of 06/07/2014  Medication Sig  . amLODipine (NORVASC) 2.5 MG tablet TAKE ONE TABLET BY MOUTH ONE TIME DAILY  . aspirin EC 81 MG tablet Take 81 mg by mouth daily.  . Cholecalciferol (VITAMIN D3) 2000 UNITS TABS Take 2,000 Units by mouth daily.  Marland Kitchen docusate sodium (COLACE) 100 MG capsule Take 1 capsule (100 mg total) by mouth every 12 (twelve) hours.  . DULoxetine (CYMBALTA) 60 MG capsule TAKE ONE CAPSULE BY MOUTH ONE TIME DAILY  . estradiol (MINIVELLE) 0.05 MG/24HR patch Place 1 patch onto the skin 2 (two) times a week. Tuesday & Saturday.  Marland Kitchen HYDROcodone-acetaminophen (NORCO/VICODIN) 5-325  MG per tablet Take 1 tablet by mouth every 8 (eight) hours.  Marland Kitchen lisinopril (PRINIVIL,ZESTRIL) 40 MG tablet TAKE ONE TABLET BY MOUTH ONE TIME DAILY  . metoprolol succinate (TOPROL-XL) 12.5 mg TB24 24 hr tablet Take 12.5 mg by mouth daily. Takes 1/4 of 50 mg daily  . polyethylene glycol (MIRALAX / GLYCOLAX) packet Take 17 g by mouth daily as needed (for constipation.).    Review of Systems  Constitutional: Negative.   HENT: Negative.   Eyes: Negative.   Respiratory: Negative.   Cardiovascular: Negative.   Gastrointestinal: Positive for constipation.  Endocrine: Negative.   Genitourinary: Negative.   Musculoskeletal: Positive for neck pain (Dr Maryjean Ka - injections).  Skin: Negative.   Allergic/Immunologic: Negative.   Neurological: Negative.   Hematological: Negative.   Psychiatric/Behavioral: Negative.        Objective:   Physical Exam  Constitutional: She is oriented to person, place, and time. She appears well-developed and well-nourished.  The patient is alert and always has questions asked about her heart, neck, and constipation.  HENT:  Head: Normocephalic and atraumatic.  Right Ear: External ear normal.  Left Ear: External ear normal.  Nose: Nose normal.  Mouth/Throat: Oropharynx is clear and moist. No oropharyngeal exudate.  Eyes: Conjunctivae and EOM are normal. Pupils are equal, round, and reactive to light. Right eye exhibits no discharge. Left eye exhibits no discharge. No scleral icterus.  Neck: Normal range of motion. Neck supple. No JVD present. No thyromegaly present.  Right supraclavicular bruit  Cardiovascular:  Normal rate and regular rhythm.  Exam reveals no gallop and no friction rub.   Murmur heard. The heart is slightly irregular at 72/m with a grade 3/6 diastolic murmur, the pedal pulses in the right foot were diminished, she had good inguinal pulses bilaterally.  Pulmonary/Chest: Effort normal and breath sounds normal. No respiratory distress. She has no  wheezes. She has no rales. She exhibits no tenderness.  Abdominal: Soft. Bowel sounds are normal. She exhibits no mass. There is no tenderness. There is no rebound and no guarding.  Generalized mild tenderness with palpation. No organ enlargement or masses  Musculoskeletal: She exhibits no edema or tenderness.  Range of motion is somewhat limited with her neck because of arthritic inflammation.  Lymphadenopathy:    She has no cervical adenopathy.  Neurological: She is alert and oriented to person, place, and time. She has normal reflexes. No cranial nerve deficit.  Skin: Skin is warm and dry. No rash noted.  Psychiatric: She has a normal mood and affect. Her behavior is normal. Judgment and thought content normal.  Nursing note and vitals reviewed.  BP 109/60 mmHg  Pulse 84  Temp(Src) 97.1 F (36.2 C) (Oral)  Ht 5' 4"  (1.626 m)  Wt 136 lb (61.689 kg)  BMI 23.33 kg/m2  LMP 08/01/1982        Assessment & Plan:  1. Essential hypertension, benign -Continue with current blood pressure medication - POCT CBC - BMP8+EGFR - Hepatic function panel  2. Hyperlipidemia -Since the patient is statin intolerant, she must continue with aggressive therapeutic lifestyle changes which include diet and exercise - POCT CBC - NMR, lipoprofile  3. Vitamin D deficiency -Continue vitamin D 3, 2001 daily - POCT CBC - Vit D  25 hydroxy (rtn osteoporosis monitoring)  4. Statin intolerance -Continue with therapeutic lifestyle changes which include diet and exercise - POCT CBC  5. Neck pain of over 3 months duration -Follow-up with neurosurgery in 2 days for an injection  6. MITRAL INSUFFICIENCY -Continue yearly follow-up with cardiology  7. Chronic neck pain -Continue pain medicine as needed and follow-up with neurosurgery  8. Chronic constipation most likely opioid induced -Continue with MiraLAX and lots of fluids  Meds ordered this encounter  Medications  . metoprolol succinate  (TOPROL-XL) 25 MG 24 hr tablet    Sig: Take 1 tablet (25 mg total) by mouth daily. As directed    Dispense:  90 tablet    Refill:  3  . amLODipine (NORVASC) 2.5 MG tablet    Sig: Take 1 tablet (2.5 mg total) by mouth daily.    Dispense:  90 tablet    Refill:  2   Patient Instructions                       Medicare Annual Wellness Visit  [facility] and the medical providers at [facility] strive to bring you the best medical care.  In doing so we not only want to address your current medical conditions and concerns but also to detect new conditions early and prevent illness, disease and health-related problems.    Medicare offers a yearly Wellness Visit which allows our clinical staff to assess your need for preventative services including immunizations, lifestyle education, counseling to decrease risk of preventable diseases and screening for fall risk and other medical concerns.    This visit is provided free of charge (no copay) for all Medicare recipients. The clinical pharmacists at [facility]  have begun to conduct these Wellness Visits which will also include a thorough review of all your medications.    As you primary medical provider recommend that you make an appointment for your Annual Wellness Visit if you have not done so already this year.  You may set up this appointment before you leave today or you may call back ([phone]) and schedule an appointment.  Please make sure when you call that you mention that you are scheduling your Annual Wellness Visit with the clinical pharmacist so that the appointment may be made for the proper length of time.     Continue current medications. Continue good therapeutic lifestyle changes which include good diet and exercise. Fall precautions discussed with patient. If an FOBT was given today- please return it to our front desk. If you are over 75 years old - you may need Prevnar 83 or the adult  Pneumonia vaccine.  Flu Shots are still available at our office. If you still haven't had one please call to set up a nurse visit to get one.   After your visit with [place] today you will receive a survey in the mail or online from [employer] regarding your care with [place]. Please take a moment to fill this out. Your feedback is very important to [place] as you can help [place] better understand your patient needs as well as improve your experience and satisfaction. WE CARE ABOUT YOU!!!   Continue follow-up with Dr. Maryjean Ka for chronic neck pain and injections If constipation continues and if you continue to take pain medication, please get back in touch with [place] regarding medication you can take for opioid-induced constipation In the meantime continue taking MiraLAX and drinking plenty of fluids Keep follow-up appointments with gynecology and cardiology Do not forget to come back for your DEXA scan   Arrie Senate MD

## 2014-06-07 NOTE — Patient Instructions (Addendum)
Medicare Annual Wellness Visit  [facility] and the medical providers at [facility] strive to bring you the best medical care.  In doing so we not only want to address your current medical conditions and concerns but also to detect new conditions early and prevent illness, disease and health-related problems.    Medicare offers a yearly Wellness Visit which allows our clinical staff to assess your need for preventative services including immunizations, lifestyle education, counseling to decrease risk of preventable diseases and screening for fall risk and other medical concerns.    This visit is provided free of charge (no copay) for all Medicare recipients. The clinical pharmacists at [facility] have begun to conduct these Wellness Visits which will also include a thorough review of all your medications.    As you primary medical provider recommend that you make an appointment for your Annual Wellness Visit if you have not done so already this year.  You may set up this appointment before you leave today or you may call back ([phone]) and schedule an appointment.  Please make sure when you call that you mention that you are scheduling your Annual Wellness Visit with the clinical pharmacist so that the appointment may be made for the proper length of time.     Continue current medications. Continue good therapeutic lifestyle changes which include good diet and exercise. Fall precautions discussed with patient. If an FOBT was given today- please return it to our front desk. If you are over 17 years old - you may need Prevnar 49 or the adult Pneumonia vaccine.  Flu Shots are still available at our office. If you still haven't had one please call to set up a nurse visit to get one.   After your visit with [place] today you will receive a survey in the mail or online from [employer] regarding your care with [place]. Please take a moment to  fill this out. Your feedback is very important to [place] as you can help [place] better understand your patient needs as well as improve your experience and satisfaction. WE CARE ABOUT YOU!!!   Continue follow-up with Dr. Maryjean Ka for chronic neck pain and injections If constipation continues and if you continue to take pain medication, please get back in touch with [place] regarding medication you can take for opioid-induced constipation In the meantime continue taking MiraLAX and drinking plenty of fluids Keep follow-up appointments with gynecology and cardiology Do not forget to come back for your DEXA scan

## 2014-06-08 LAB — NMR, LIPOPROFILE
Cholesterol: 194 mg/dL (ref 100–199)
HDL CHOLESTEROL BY NMR: 63 mg/dL (ref >39)
HDL Particle Number: 31.8 umol/L (ref >=30.5)
LDL Particle Number: 1150 nmol/L — ABNORMAL HIGH (ref <1000)
LDL Size: 20.8 nm (ref >20.5)
LDL-C: 116 mg/dL — AB (ref 0–99)
Small LDL Particle Number: 365 nmol/L (ref <=527)
Triglycerides by NMR: 76 mg/dL (ref 0–149)

## 2014-06-08 LAB — HEPATIC FUNCTION PANEL
ALT: 14 IU/L (ref 0–32)
AST: 23 IU/L (ref 0–40)
Albumin: 4.2 g/dL (ref 3.5–4.8)
Alkaline Phosphatase: 38 IU/L — ABNORMAL LOW (ref 39–117)
BILIRUBIN DIRECT: 0.19 mg/dL (ref 0.00–0.40)
Total Bilirubin: 0.7 mg/dL (ref 0.0–1.2)
Total Protein: 6.7 g/dL (ref 6.0–8.5)

## 2014-06-08 LAB — BMP8+EGFR
BUN/Creatinine Ratio: 26 (ref 11–26)
BUN: 19 mg/dL (ref 8–27)
CO2: 27 mmol/L (ref 18–29)
CREATININE: 0.73 mg/dL (ref 0.57–1.00)
Calcium: 9.7 mg/dL (ref 8.7–10.3)
Chloride: 98 mmol/L (ref 97–108)
GFR calc Af Amer: 91 mL/min/{1.73_m2} (ref >59)
GFR calc non Af Amer: 79 mL/min/{1.73_m2} (ref >59)
Glucose: 89 mg/dL (ref 65–99)
Potassium: 4.7 mmol/L (ref 3.5–5.2)
Sodium: 138 mmol/L (ref 134–144)

## 2014-06-08 LAB — VITAMIN D 25 HYDROXY (VIT D DEFICIENCY, FRACTURES): VIT D 25 HYDROXY: 44.1 ng/mL (ref 30.0–100.0)

## 2014-06-09 DIAGNOSIS — M47813 Spondylosis without myelopathy or radiculopathy, cervicothoracic region: Secondary | ICD-10-CM | POA: Diagnosis not present

## 2014-06-09 DIAGNOSIS — M542 Cervicalgia: Secondary | ICD-10-CM | POA: Diagnosis not present

## 2014-06-26 ENCOUNTER — Other Ambulatory Visit: Payer: Self-pay | Admitting: Family Medicine

## 2014-07-04 ENCOUNTER — Telehealth: Payer: Self-pay | Admitting: Family Medicine

## 2014-07-04 NOTE — Telephone Encounter (Signed)
Call put to TBE

## 2014-07-11 DIAGNOSIS — Z1231 Encounter for screening mammogram for malignant neoplasm of breast: Secondary | ICD-10-CM | POA: Diagnosis not present

## 2014-07-14 ENCOUNTER — Encounter: Payer: Self-pay | Admitting: Nurse Practitioner

## 2014-07-14 ENCOUNTER — Ambulatory Visit (INDEPENDENT_AMBULATORY_CARE_PROVIDER_SITE_OTHER): Payer: Medicare Other | Admitting: Nurse Practitioner

## 2014-07-14 VITALS — BP 141/69 | HR 59 | Temp 98.3°F | Ht 64.0 in | Wt 137.2 lb

## 2014-07-14 DIAGNOSIS — M25512 Pain in left shoulder: Secondary | ICD-10-CM

## 2014-07-14 NOTE — Progress Notes (Signed)
   Subjective:    Patient ID: Patricia Villa, female    DOB: 10-25-1932, 81 y.o.   MRN: [id number]   HPI Patient came to office with her husband for a pharmacy visit and complained of left shoulder and arm pain last night. Patient still experiencing left shoulder pain, but states she has had bilateral shoulder pain chronically, states she experienced achy left shoulder pain last night and presents with this pain today. When asked to point to area of pain she touches the left anterior chest wall. Has pain with movement and sitting still. Pain seems to move around left neck and posterior shoulder.   Review of Systems  Constitutional: Negative.   HENT: Negative.   Respiratory: Negative for chest tightness and shortness of breath.   Cardiovascular: Negative for chest pain and palpitations.  Gastrointestinal: Negative for nausea and vomiting.  Genitourinary: Negative.   Musculoskeletal: Positive for arthralgias.  Neurological: Negative.   Psychiatric/Behavioral: Negative.   All other systems reviewed and are negative.      Objective:   Physical Exam  Constitutional: She is oriented to person, place, and time. She appears well-developed and well-nourished. No distress.  Cardiovascular: Normal rate, regular rhythm and normal heart sounds.   Pulmonary/Chest: Effort normal and breath sounds normal.  Neurological: She is alert and oriented to person, place, and time.  Skin: Skin is warm and dry.  Psychiatric: She has a normal mood and affect. Her behavior is normal. Judgment and thought content normal.  Vitals reviewed.   BP 141/69 mmHg  Pulse 59  Temp(Src) 98.3 F (36.8 C) (Oral)  Ht 5\' 4"  (1.626 m)  Wt 137 lb 3.2 oz (62.234 kg)  BMI 23.54 kg/m2  LMP 08/01/1982  EKG- NSR with first degree heart block-Mary-Margaret Hassell Done, FNP- consulted with Dr. Claretta Fraise      Assessment & Plan:   1. Shoulder pain, left    Tylenol OTC If ain continues to ER Follow up  prn  Mary-Margaret Hassell Done, FNP

## 2014-07-14 NOTE — Patient Instructions (Signed)

## 2014-07-20 DIAGNOSIS — K5900 Constipation, unspecified: Secondary | ICD-10-CM | POA: Diagnosis not present

## 2014-07-26 ENCOUNTER — Encounter: Payer: Self-pay | Admitting: *Deleted

## 2014-07-26 NOTE — Progress Notes (Signed)
Please call the patient back and let them know that his is not coming from my office.  Patricia Villa is not in our practice.  Please document this.

## 2014-07-26 NOTE — Progress Notes (Signed)
Patient's son came in today to our office in [place]  He is concerned about his mother taking too much pain medication.  He feels that she is taking too much and that she is more unsteady. They are worried she may fall. Please keep this in mind as pt'd son states she is getting it from Dr [employer] PA.  Dr Laurance Flatten advises that they start to wean her off.  The prescriber on her bottle is Simeon Craft, [place]

## 2014-07-26 NOTE — Progress Notes (Signed)
Note forwarded to jamie bullins

## 2014-07-27 ENCOUNTER — Telehealth: Payer: Self-pay | Admitting: Family Medicine

## 2014-07-28 ENCOUNTER — Telehealth: Payer: Self-pay | Admitting: Family Medicine

## 2014-07-28 NOTE — Telephone Encounter (Signed)
Noted- will contact

## 2014-07-28 NOTE — Telephone Encounter (Signed)
Spoke with patient and she states that she is having problems with constipation and heart palpitations. She states that these palpitations have been going on for 1 week. No complaints of SOB or chest pain. Gave patient an appt for Saturday clinic because she insisted on seeing physician here at the office. Advised patient that she should contact Dr Percival Spanish and let him know what was going on. Also advised that if her palpitations become worse or if she starts to have chest pain or SOB to go to the nearest ER. Patient verbalized understanding and stated that she would contact [facility].

## 2014-07-30 ENCOUNTER — Ambulatory Visit: Payer: Medicare Other

## 2014-08-01 ENCOUNTER — Encounter: Payer: Self-pay | Admitting: Family Medicine

## 2014-08-01 ENCOUNTER — Ambulatory Visit (INDEPENDENT_AMBULATORY_CARE_PROVIDER_SITE_OTHER): Payer: Medicare Other | Admitting: Family Medicine

## 2014-08-01 VITALS — BP 108/62 | HR 92 | Temp 97.5°F | Ht 64.0 in | Wt 130.0 lb

## 2014-08-01 DIAGNOSIS — N3001 Acute cystitis with hematuria: Secondary | ICD-10-CM | POA: Diagnosis not present

## 2014-08-01 DIAGNOSIS — R300 Dysuria: Secondary | ICD-10-CM

## 2014-08-01 LAB — POCT URINALYSIS DIPSTICK

## 2014-08-01 LAB — POCT UA - MICROSCOPIC ONLY
Casts, Ur, LPF, POC: NEGATIVE
Crystals, Ur, HPF, POC: NEGATIVE
MUCUS UA: NEGATIVE
Yeast, UA: NEGATIVE

## 2014-08-01 MED ORDER — CIPROFLOXACIN HCL 500 MG PO TABS: 500.0000 mg | ORAL_TABLET | Freq: Two times a day (BID) | ORAL | Status: DC

## 2014-08-01 NOTE — Patient Instructions (Signed)
The patient will be encouraged to drink plenty of water and plenty of fluids She should take the antibiotic until its completed and when it is completed we should repeat a urinalysis They will need to bring an additional specimen back to do a urine culture and sensitivity

## 2014-08-01 NOTE — Progress Notes (Signed)
Subjective:    Patient ID: Patricia Villa, female    DOB: Jan 12, 1933, 81 y.o.   MRN: [id number]  HPI Patient here today for uti. She is accompanied today by her daughter in law. Patient comes in today complaining of voiding and frequency. She has not been feeling well for a couple weeks and the symptoms with her urinary tract infection or not very obvious.        Patient Active Problem List   Diagnosis Date Noted  . Chronic neck pain 06/07/2014  . DDD (degenerative disc disease), cervical 01/24/2014  . Hyperlipidemia 05/17/2013  . Colitis 01/20/2013  . Generalized anxiety disorder 08/05/2012  . Meningioma 08/05/2012  . Bruit 10/17/2010  . Essential hypertension, benign 10/04/2009  . PALPITATIONS 10/04/2009  . MITRAL INSUFFICIENCY 09/26/2009  . MITRAL VALVE PROLAPSE 09/26/2009  . Hypertrophic obstructive cardiomyopathy(425.11) 09/26/2009   Outpatient Encounter Prescriptions as of 08/01/2014  Medication Sig  . amLODipine (NORVASC) 2.5 MG tablet Take 1 tablet (2.5 mg total) by mouth daily.  Marland Kitchen aspirin EC 81 MG tablet Take 81 mg by mouth daily.  . Cholecalciferol (VITAMIN D3) 2000 UNITS TABS Take 2,000 Units by mouth daily.  Marland Kitchen docusate sodium (COLACE) 100 MG capsule Take 1 capsule (100 mg total) by mouth every 12 (twelve) hours.  . DULoxetine (CYMBALTA) 60 MG capsule TAKE ONE CAPSULE BY MOUTH ONE TIME DAILY  . estradiol (MINIVELLE) 0.05 MG/24HR patch Place 1 patch onto the skin 2 (two) times a week. Tuesday & Saturday.  Marland Kitchen lisinopril (PRINIVIL,ZESTRIL) 40 MG tablet TAKE ONE TABLET BY MOUTH ONE TIME DAILY  . metoprolol succinate (TOPROL-XL) 25 MG 24 hr tablet Take 1 tablet (25 mg total) by mouth daily. As directed  . polyethylene glycol (MIRALAX / GLYCOLAX) packet Take 17 g by mouth daily as needed (for constipation.).  . [DISCONTINUED] HYDROcodone-acetaminophen (NORCO/VICODIN) 5-325 MG per tablet Take 1 tablet by mouth every 8 (eight) hours.    Review of Systems    Constitutional: Negative.   HENT: Negative.   Eyes: Negative.   Respiratory: Negative.   Cardiovascular: Negative.   Gastrointestinal: Negative.   Endocrine: Negative.   Genitourinary: Positive for dysuria and frequency.  Musculoskeletal: Negative.   Skin: Negative.   Allergic/Immunologic: Negative.   Neurological: Negative.   Hematological: Negative.   Psychiatric/Behavioral: Negative.        Objective:   Physical Exam  Constitutional: She is oriented to person, place, and time. She appears well-developed and well-nourished. No distress.  HENT:  Head: Normocephalic.  Eyes: Conjunctivae and EOM are normal. Pupils are equal, round, and reactive to light. Right eye exhibits no discharge. Left eye exhibits no discharge.  Neck: Normal range of motion.  Abdominal: Soft. Bowel sounds are normal. She exhibits no mass. There is tenderness. There is no rebound and no guarding.  Slight suprapubic tenderness  Musculoskeletal: Normal range of motion.  Neurological: She is alert and oriented to person, place, and time.  Skin: Skin is warm and dry. No rash noted.  Psychiatric: She has a normal mood and affect. Her behavior is normal. Judgment and thought content normal.  Vitals reviewed.  BP 108/62 mmHg  Pulse 92  Temp(Src) 97.5 F (36.4 C) (Oral)  Ht 5\' 4"  (1.626 m)  Wt 130 lb (58.968 kg)  BMI 22.30 kg/m2  LMP 08/01/1982        Assessment & Plan:  1. Dysuria -Drink plenty of fluids and even after the infection is completed treated please continue to drink plenty of water  every day - POCT urinalysis dipstick - POCT UA - Microscopic Only - Urine culture  2. Acute cystitis with hematuria -We will call with the results of the urine culture and sensitivity as soon as that becomes available in the meantime continue to take antibiotic as directed - ciprofloxacin (CIPRO) 500 MG tablet; Take 1 tablet (500 mg total) by mouth 2 (two) times daily.  Dispense: 14 tablet; Refill:  0  Patient Instructions  The patient will be encouraged to drink plenty of water and plenty of fluids She should take the antibiotic until its completed and when it is completed we should repeat a urinalysis They will need to bring an additional specimen back to do a urine culture and sensitivity   Arrie Senate MD

## 2014-08-03 LAB — URINE CULTURE

## 2014-08-04 ENCOUNTER — Telehealth: Payer: Self-pay | Admitting: Family Medicine

## 2014-08-05 NOTE — Telephone Encounter (Signed)
Note under lab results

## 2014-08-11 ENCOUNTER — Other Ambulatory Visit (INDEPENDENT_AMBULATORY_CARE_PROVIDER_SITE_OTHER): Payer: Medicare Other

## 2014-08-11 DIAGNOSIS — R300 Dysuria: Secondary | ICD-10-CM

## 2014-08-11 LAB — POCT URINALYSIS DIPSTICK
Bilirubin, UA: NEGATIVE
Blood, UA: NEGATIVE
Glucose, UA: NEGATIVE
KETONES UA: NEGATIVE
Nitrite, UA: NEGATIVE
PH UA: 6
PROTEIN UA: NEGATIVE
SPEC GRAV UA: 1.015
Urobilinogen, UA: NEGATIVE

## 2014-08-11 LAB — POCT UA - MICROSCOPIC ONLY
Bacteria, U Microscopic: NEGATIVE
CASTS, UR, LPF, POC: NEGATIVE
CRYSTALS, UR, HPF, POC: NEGATIVE
Mucus, UA: NEGATIVE
RBC, urine, microscopic: NEGATIVE

## 2014-08-11 NOTE — Progress Notes (Signed)
Lab only 

## 2014-08-12 ENCOUNTER — Other Ambulatory Visit: Payer: Medicare Other

## 2014-08-12 ENCOUNTER — Telehealth: Payer: Self-pay | Admitting: Family Medicine

## 2014-08-12 DIAGNOSIS — N390 Urinary tract infection, site not specified: Secondary | ICD-10-CM

## 2014-08-12 NOTE — Telephone Encounter (Signed)
Urine culture results won't be back until 08/15/14. Patient notified.

## 2014-08-12 NOTE — Progress Notes (Signed)
Lab only 

## 2014-08-14 LAB — URINE CULTURE

## 2014-08-15 NOTE — Telephone Encounter (Signed)
Patient aware of urine results and script for macrodanton left on [facility] voice mail.

## 2014-08-15 NOTE — Telephone Encounter (Signed)
-----   Message from Chipper Herb, MD sent at 08/14/2014  8:16 PM EDT ----- The Escherichia coli growing on the urine culture was resistant to the Cipro the patient has been started on. This should be discontinued and start Macrodantin 100 mg twice daily with food for 7 days and recheck urinalysis when completed

## 2014-08-22 ENCOUNTER — Encounter: Payer: Self-pay | Admitting: Family Medicine

## 2014-08-23 ENCOUNTER — Other Ambulatory Visit: Payer: Self-pay | Admitting: *Deleted

## 2014-08-23 DIAGNOSIS — D329 Benign neoplasm of meninges, unspecified: Secondary | ICD-10-CM

## 2014-08-26 ENCOUNTER — Other Ambulatory Visit: Payer: Self-pay | Admitting: *Deleted

## 2014-08-26 ENCOUNTER — Other Ambulatory Visit (INDEPENDENT_AMBULATORY_CARE_PROVIDER_SITE_OTHER): Payer: Medicare Other

## 2014-08-26 DIAGNOSIS — R300 Dysuria: Secondary | ICD-10-CM

## 2014-08-26 DIAGNOSIS — Z780 Asymptomatic menopausal state: Secondary | ICD-10-CM

## 2014-08-26 LAB — POCT URINALYSIS DIPSTICK
Blood, UA: NEGATIVE
Glucose, UA: NEGATIVE
Leukocytes, UA: NEGATIVE
Nitrite, UA: NEGATIVE
PH UA: 6
Protein, UA: NEGATIVE
Spec Grav, UA: >=1.03
Urobilinogen, UA: NEGATIVE

## 2014-08-26 LAB — POCT UA - MICROSCOPIC ONLY
BACTERIA, U MICROSCOPIC: NEGATIVE
CASTS, UR, LPF, POC: NEGATIVE
CRYSTALS, UR, HPF, POC: NEGATIVE
Yeast, UA: NEGATIVE

## 2014-08-26 NOTE — Progress Notes (Signed)
Lab only 

## 2014-08-31 ENCOUNTER — Ambulatory Visit (INDEPENDENT_AMBULATORY_CARE_PROVIDER_SITE_OTHER): Payer: Medicare Other

## 2014-08-31 ENCOUNTER — Encounter: Payer: Self-pay | Admitting: Pharmacist

## 2014-08-31 ENCOUNTER — Ambulatory Visit (INDEPENDENT_AMBULATORY_CARE_PROVIDER_SITE_OTHER): Payer: Medicare Other | Admitting: Pharmacist

## 2014-08-31 ENCOUNTER — Telehealth: Payer: Self-pay | Admitting: Family Medicine

## 2014-08-31 VITALS — Ht 64.0 in | Wt 132.0 lb

## 2014-08-31 DIAGNOSIS — M810 Age-related osteoporosis without current pathological fracture: Secondary | ICD-10-CM

## 2014-08-31 DIAGNOSIS — Z780 Asymptomatic menopausal state: Secondary | ICD-10-CM | POA: Diagnosis not present

## 2014-08-31 NOTE — Patient Instructions (Signed)
Fall Prevention and Home Safety Falls cause injuries and can affect all age groups. It is possible to use preventive measures to significantly decrease the likelihood of falls. There are many simple measures which can make your home safer and prevent falls. OUTDOORS  Repair cracks and edges of walkways and driveways.  Remove high doorway thresholds.  Trim shrubbery on the main path into your home.  Have good outside lighting.  Clear walkways of tools, rocks, debris, and clutter.  Check that handrails are not broken and are securely fastened. Both sides of steps should have handrails.  Have leaves, snow, and ice cleared regularly.  Use sand or salt on walkways during winter months.  In the garage, clean up grease or oil spills. BATHROOM  Install night lights.  Install grab bars by the toilet and in the tub and shower.  Use non-skid mats or decals in the tub or shower.  Place a plastic non-slip stool in the shower to sit on, if needed.  Keep floors dry and clean up all water on the floor immediately.  Remove soap buildup in the tub or shower on a regular basis.  Secure bath mats with non-slip, double-sided rug tape.  Remove throw rugs and tripping hazards from the floors. BEDROOMS  Install night lights.  Make sure a bedside light is easy to reach.  Do not use oversized bedding.  Keep a telephone by your bedside.  Have a firm chair with side arms to use for getting dressed.  Remove throw rugs and tripping hazards from the floor. KITCHEN  Keep handles on pots and pans turned toward the center of the stove. Use back burners when possible.  Clean up spills quickly and allow time for drying.  Avoid walking on wet floors.  Avoid hot utensils and knives.  Position shelves so they are not too high or low.  Place commonly used objects within easy reach.  If necessary, use a sturdy step stool with a grab bar when reaching.  Keep electrical cables out of the  way.  Do not use floor polish or wax that makes floors slippery. If you must use wax, use non-skid floor wax.  Remove throw rugs and tripping hazards from the floor. STAIRWAYS  Never leave objects on stairs.  Place handrails on both sides of stairways and use them. Fix any loose handrails. Make sure handrails on both sides of the stairways are as long as the stairs.  Check carpeting to make sure it is firmly attached along stairs. Make repairs to worn or loose carpet promptly.  Avoid placing throw rugs at the top or bottom of stairways, or properly secure the rug with carpet tape to prevent slippage. Get rid of throw rugs, if possible.  Have an electrician put in a light switch at the top and bottom of the stairs. OTHER FALL PREVENTION TIPS  Wear low-heel or rubber-soled shoes that are supportive and fit well. Wear closed toe shoes.  When using a stepladder, make sure it is fully opened and both spreaders are firmly locked. Do not climb a closed stepladder.  Add color or contrast paint or tape to grab bars and handrails in your home. Place contrasting color strips on first and last steps.  Learn and use mobility aids as needed. Install an electrical emergency response system.  Turn on lights to avoid dark areas. Replace light bulbs that burn out immediately. Get light switches that glow.  Arrange furniture to create clear pathways. Keep furniture in the same place.    Firmly attach carpet with non-skid or double-sided tape.  Eliminate uneven floor surfaces.  Select a carpet pattern that does not visually hide the edge of steps.  Be aware of all pets. OTHER HOME SAFETY TIPS  Set the water temperature for 120 F (48.8 C).  Keep emergency numbers on or near the telephone.  Keep smoke detectors on every level of the home and near sleeping areas. Document Released: 04/12/2002 Document Revised: 10/22/2011 Document Reviewed: 07/12/2011 Exit[employer] Patient Information 2015  [employer]. This information is not intended to replace advice given to you by your health care provider. Make sure you discuss any questions you have with your health care provider.                Exercise for Strong Bones  Exercise is important to build and maintain strong bones / bone density.  There are 2 types of exercises that are important to building and maintaining strong bones:  Weight- bearing and muscle-stregthening.  Weight-bearing Exercises  These exercises include activities that make you move against gravity while staying upright. Weight-bearing exercises can be high-impact or low-impact.  High-impact weight-bearing exercises help build bones and keep them strong. If you have broken a bone due to osteoporosis or are at risk of breaking a bone, you may need to avoid high-impact exercises. If you're not sure, you should check with your healthcare provider.  Examples of high-impact weight-bearing exercises are: Dancing  Doing high-impact aerobics  Hiking  Jogging/running  Jumping Rope  Stair climbing  Tennis  Low-impact weight-bearing exercises can also help keep bones strong and are a safe alternative if you cannot do high-impact exercises.   Examples of low-impact weight-bearing exercises are: Using elliptical training machines  Doing low-impact aerobics  Using stair-step machines  Fast walking on a treadmill or outside   Muscle-Strengthening Exercises These exercises include activities where you move your body, a weight or some other resistance against gravity. They are also known as resistance exercises and include: Lifting weights  Using elastic exercise bands  Using weight machines  Lifting your own body weight  Functional movements, such as standing and rising up on your toes  Yoga and Pilates can also improve strength, balance and flexibility. However, certain positions may not be safe for people with osteoporosis or those at increased risk of broken  bones. For example, exercises that have you bend forward may increase the chance of breaking a bone in the spine.   Non-Impact Exercises There are other types of exercises that can help prevent falls.  Non-impact exercises can help you to improve balance, posture and how well you move in everyday activities. Some of these exercises include: Balance exercises that strengthen your legs and test your balance, such as Tai Chi, can decrease your risk of falls.  Posture exercises that improve your posture and reduce rounded or "sloping" shoulders can help you decrease the chance of breaking a bone, especially in the spine.  Functional exercises that improve how well you move can help you with everyday activities and decrease your chance of falling and breaking a bone. For example, if you have trouble getting up from a chair or climbing stairs, you should do these activities as exercises.   **A physical therapist can teach you balance, posture and functional exercises. He/she can also help you learn which exercises are safe and appropriate for you.  [facility] has a physical therapy office in [place] in front of our office and referrals can be made for assessments   and treatment as needed and strength and balance training.  If you would like to have an assessment with [facility] and our physical therapy team please let a nurse or provider know.    

## 2014-08-31 NOTE — Progress Notes (Signed)
Patient ID: Patricia Villa, female   DOB: June 10, 1932, 81 y.o.   MRN: [id number] [facility] Current Height: Height: 5\' 4"  (162.6 cm)    Max Lifetime Height:  5'6 Current Weight: Weight: 132 lb (59.875 kg)     Ethnicity:Caucasian   HPI: Patient has history of osteopenia.  She was tried oral bisphosphonates but has esophagitis with both Actonel and Fosamax.  The last time BMD was checked Reclast was discussed but patient declined because she was afraid of side effects.    Back Pain?  No       Kyphosis?  Yes Prior fracture?  No  PMH: Age at menopause: early 64s Hysterectomy?  Yes Oophorectomy?  Yes HRT? Yes - Current.  Type/duration: minivelle patches Steroid Use?  No Thyroid med?  No History of cancer?  No History of digestive disorders (ie Crohn's)?  Yes dx of colitis at hospital, can get constipated for a week 1-2x/year,  Current or previous eating disorders?  No Last Vitamin D Result:  44.1 Last GFR Result:  83   FH/SH: Family history of osteoporosis?  No Parent with history of hip fracture?  No Family history of breast cancer?  No Exercise?  No Smoking?  No Alcohol?  No    Calcium Assessment Calcium Intake  # of servings/day  Calcium mg  Milk (8 oz) 0  x  300  = 0  Yogurt (4 oz) 1 x  200 = 200  Cheese (1 oz) 1 x  200 = 200  Other Calcium sources 2  500mg   Ca supplement 0 = 0   Estimated calcium intake per day 900mg     DEXA Results Date of Test T-Score for AP Spine L1-L4 T-Score for Total Left Hip T-Score for Total Right Hip T-Sore for Neck of Left Hip T-Score for Neck of Right Hip  08/31/2014 -1.8 -1.6 -1.9 -2.5 -2.3  04/08/2012 -1.2 -1.2 -1.5 -1.8 -2.1  02/07/2010 -0.8 -1.1 -1.4 -2.0 -2.0          Assessment: Osteoporosis - decreased T-Scores since last BMD check  Recommendations: 1.  Will check into coverage of Prolia for patient.  Prolia 60mg  SQ q 6 months 2.  recommend calcium 1200mg  daily through supplementation or diet.  3.  recommend weight  bearing exercise - 30 minutes at least 4 days per week.   4.  Counseled and educated about fall risk and prevention.  Recheck DEXA:  2 years  Time spent counseling patient:  25 minutes   Cherre Robins, PharmD, CPP

## 2014-09-08 ENCOUNTER — Ambulatory Visit
Admission: RE | Admit: 2014-09-08 | Discharge: 2014-09-08 | Disposition: A | Discharge status: Home / Self Care | Payer: Medicare Other | Source: Ambulatory Visit | Attending: Family Medicine | Admitting: Family Medicine

## 2014-09-08 DIAGNOSIS — D320 Benign neoplasm of cerebral meninges: Secondary | ICD-10-CM | POA: Diagnosis not present

## 2014-09-08 DIAGNOSIS — D329 Benign neoplasm of meninges, unspecified: Secondary | ICD-10-CM

## 2014-09-08 DIAGNOSIS — R51 Headache: Secondary | ICD-10-CM | POA: Diagnosis not present

## 2014-09-12 ENCOUNTER — Other Ambulatory Visit: Payer: BLUE CROSS/BLUE SHIELD

## 2014-09-14 ENCOUNTER — Telehealth: Payer: Self-pay | Admitting: Family Medicine

## 2014-09-16 NOTE — Telephone Encounter (Signed)
Dr Laurance Flatten, the MRI is back .

## 2014-09-16 NOTE — Telephone Encounter (Signed)
Everything is stable with a meningioma and there was no significant change. There were chronic microvascular changes which are part of aging. No problems with the meningioma and that it is stable.

## 2014-09-21 ENCOUNTER — Other Ambulatory Visit: Payer: Self-pay | Admitting: Family Medicine

## 2014-09-22 ENCOUNTER — Telehealth: Payer: Self-pay | Admitting: Cardiology

## 2014-09-23 NOTE — Telephone Encounter (Signed)
Closed encounter °

## 2014-10-18 ENCOUNTER — Other Ambulatory Visit: Payer: Self-pay | Admitting: Family Medicine

## 2014-10-18 ENCOUNTER — Encounter: Payer: Self-pay | Admitting: Family Medicine

## 2014-10-18 ENCOUNTER — Ambulatory Visit (INDEPENDENT_AMBULATORY_CARE_PROVIDER_SITE_OTHER): Payer: Medicare Other | Admitting: Family Medicine

## 2014-10-18 ENCOUNTER — Ambulatory Visit (INDEPENDENT_AMBULATORY_CARE_PROVIDER_SITE_OTHER): Payer: Medicare Other

## 2014-10-18 VITALS — BP 129/72 | HR 70 | Temp 98.0°F | Ht 64.0 in | Wt 134.0 lb

## 2014-10-18 DIAGNOSIS — I10 Essential (primary) hypertension: Secondary | ICD-10-CM

## 2014-10-18 DIAGNOSIS — R0602 Shortness of breath: Secondary | ICD-10-CM

## 2014-10-18 DIAGNOSIS — E785 Hyperlipidemia, unspecified: Secondary | ICD-10-CM

## 2014-10-18 DIAGNOSIS — M79662 Pain in left lower leg: Secondary | ICD-10-CM

## 2014-10-18 DIAGNOSIS — M25572 Pain in left ankle and joints of left foot: Secondary | ICD-10-CM

## 2014-10-18 DIAGNOSIS — E559 Vitamin D deficiency, unspecified: Secondary | ICD-10-CM | POA: Diagnosis not present

## 2014-10-18 DIAGNOSIS — M542 Cervicalgia: Secondary | ICD-10-CM

## 2014-10-18 DIAGNOSIS — Z889 Allergy status to unspecified drugs, medicaments and biological substances status: Secondary | ICD-10-CM | POA: Diagnosis not present

## 2014-10-18 DIAGNOSIS — Z789 Other specified health status: Secondary | ICD-10-CM

## 2014-10-18 LAB — POCT CBC
Granulocyte percent: 63.4 %G (ref 37–80)
HCT, POC: 35.9 % — AB (ref 37.7–47.9)
Hemoglobin: 11.4 g/dL — AB (ref 12.2–16.2)
Lymph, poc: 1.8 (ref 0.6–3.4)
MCH: 30.2 pg (ref 27–31.2)
MCHC: 31.9 g/dL (ref 31.8–35.4)
MCV: 94.6 fL (ref 80–97)
MPV: 9.2 fL (ref 0–99.8)
PLATELET COUNT, POC: 240 10*3/uL (ref 142–424)
POC Granulocyte: 4.1 (ref 2–6.9)
POC LYMPH PERCENT: 27.6 %L (ref 10–50)
RBC: 3.79 M/uL — AB (ref 4.04–5.48)
RDW, POC: 13.8 %
WBC: 6.4 10*3/uL (ref 4.6–10.2)

## 2014-10-18 NOTE — Progress Notes (Signed)
Subjective:    Patient ID: Patricia Villa, female    DOB: 12/27/1932, 81 y.o.   MRN: [id number]  HPI Pt here for follow up and management of chronic medical problems which includes hyperlipidemia, hypertension and neck pain. She is taking medications regularly. The patient complains of some shortness of breath with exertion. She is off so had some swelling in her left ankle and some neck pain. A previous cervical spine film series showed degenerative changes in the neck.      Patient Active Problem List   Diagnosis Date Noted  . Osteoporosis 08/31/2014  . Chronic neck pain 06/07/2014  . DDD (degenerative disc disease), cervical 01/24/2014  . Hyperlipidemia 05/17/2013  . Colitis 01/20/2013  . Generalized anxiety disorder 08/05/2012  . Meningioma 08/05/2012  . Bruit 10/17/2010  . Essential hypertension, benign 10/04/2009  . PALPITATIONS 10/04/2009  . MITRAL INSUFFICIENCY 09/26/2009  . MITRAL VALVE PROLAPSE 09/26/2009  . Hypertrophic obstructive cardiomyopathy(425.11) 09/26/2009   Outpatient Encounter Prescriptions as of 10/18/2014  Medication Sig  . amLODipine (NORVASC) 2.5 MG tablet Take 1 tablet (2.5 mg total) by mouth daily.  Marland Kitchen aspirin EC 81 MG tablet Take 81 mg by mouth daily.  . Cholecalciferol (VITAMIN D3) 2000 UNITS TABS Take 2,000 Units by mouth daily.  . DULoxetine (CYMBALTA) 60 MG capsule TAKE ONE CAPSULE BY MOUTH ONE TIME DAILY  . estradiol (MINIVELLE) 0.05 MG/24HR patch Place 1 patch onto the skin 2 (two) times a week. Tuesday & Saturday.  Marland Kitchen lisinopril (PRINIVIL,ZESTRIL) 40 MG tablet TAKE ONE TABLET BY MOUTH ONE TIME DAILY  . metoprolol succinate (TOPROL-XL) 25 MG 24 hr tablet Take 1 tablet (25 mg total) by mouth daily. As directed  . polyethylene glycol (MIRALAX / GLYCOLAX) packet Take 17 g by mouth daily as needed (for constipation.).  . [DISCONTINUED] docusate sodium (COLACE) 100 MG capsule Take 1 capsule (100 mg total) by mouth every 12 (twelve) hours.  .  [DISCONTINUED] ciprofloxacin (CIPRO) 500 MG tablet Take 1 tablet (500 mg total) by mouth 2 (two) times daily.   No facility-administered encounter medications on file as of 10/18/2014.      Review of Systems  Constitutional: Negative.   HENT: Negative.   Eyes: Negative.   Respiratory: Positive for shortness of breath (with excertion).   Cardiovascular: Positive for leg swelling (left ankle).  Gastrointestinal: Negative.   Endocrine: Negative.   Genitourinary: Negative.   Musculoskeletal: Positive for neck pain.  Skin: Negative.   Allergic/Immunologic: Negative.   Neurological: Negative.   Hematological: Negative.   Psychiatric/Behavioral: Negative.        Objective:   Physical Exam  Constitutional: She is oriented to person, place, and time. She appears well-developed and well-nourished. She appears distressed.  The patient is pleasant and somewhat uptight intents.  HENT:  Head: Normocephalic and atraumatic.  Right Ear: External ear normal.  Left Ear: External ear normal.  Nose: Nose normal.  Mouth/Throat: Oropharynx is clear and moist.  Eyes: Conjunctivae and EOM are normal. Pupils are equal, round, and reactive to light. Right eye exhibits no discharge. Left eye exhibits no discharge. No scleral icterus.  Neck: Normal range of motion. Neck supple. No thyromegaly present.  Neck was without bruits or thyromegaly or anterior cervical adenopathy  Cardiovascular: Normal rate, regular rhythm, normal heart sounds and intact distal pulses.  Exam reveals no gallop and no friction rub.   No murmur heard. Distal pulses were somewhat difficult to palpate but were palpable. The heart had a regular rate and  rhythm at 72/m  Pulmonary/Chest: Effort normal and breath sounds normal. No respiratory distress. She has no wheezes. She has no rales. She exhibits no tenderness.  Clear anteriorly and posteriorly  Abdominal: Soft. Bowel sounds are normal. She exhibits no mass. There is no  tenderness. There is no rebound and no guarding.  No abdominal or epigastric tenderness  Musculoskeletal: Normal range of motion. She exhibits edema and tenderness.  Left ankle was somewhat more swollen than the right but there were a lot of varicosities around both ankles. There is also some calf tenderness on the left side.  Lymphadenopathy:    She has no cervical adenopathy.  Neurological: She is alert and oriented to person, place, and time. She has normal reflexes. No cranial nerve deficit.  Skin: Skin is warm and dry. No rash noted. No erythema. No pallor.  Psychiatric: She has a normal mood and affect. Her behavior is normal. Judgment and thought content normal.  Nursing note and vitals reviewed.  BP 129/72 mmHg  Pulse 70  Temp(Src) 98 F (36.7 C) (Oral)  Ht $R'5\' 4"'XT$  (1.626 m)  Wt 134 lb (60.782 kg)  BMI 22.99 kg/m2  LMP 08/01/1982   [facility] reading (PRIMARY) by  Dr. Brunilda Payor x-ray--the heart is slightly enlarged otherwise no specific finding                                       Assessment & Plan:  1. Essential hypertension, benign -The blood pressure is under good control today the patient should continue with current treatment - POCT CBC - BMP8+EGFR - Hepatic function panel - DG Chest 2 View; Future  2. Vitamin D deficiency -She should continue with vitamin D replacement pending results of lab work - POCT CBC - Vit D  25 hydroxy (rtn osteoporosis monitoring)  3. Hyperlipidemia -The patient is statin intolerant and she should continue with as aggressive therapeutic lifestyle changes as possible which include diet and exercise - POCT CBC - NMR, lipoprofile  4. Statin intolerance -Continue with diet and exercise - POCT CBC - NMR, lipoprofile  5. Neck pain, bilateral -She is tender over her right occiput. She will take ibuprofen as long as it doesn't bother her stomach twice daily after breakfast and supper and use warm wet compresses. If the ibuprofen bothers her  stomach she was instructed to discontinue its use - POCT CBC  6. SOB (shortness of breath) -We'll get a pulse ox today and get a chest x-ray. We have encouraged her to be more active physically - DG Chest 2 View; Future  7. Left ankle pain -Cause of the left ankle and calf pain we will schedule venous Dopplers  8. Calf pain, left -Venous Dopplers and use above-the-knee support hose bilaterally  Patient Instructions                       Medicare Annual Wellness Visit  [facility] and the medical providers at [facility] strive to bring you the best medical care.  In doing so we not only want to address your current medical conditions and concerns but also to detect new conditions early and prevent illness, disease and health-related problems.    Medicare offers a yearly Wellness Visit which allows our clinical staff to assess your need for preventative services including immunizations, lifestyle education, counseling to decrease risk of preventable diseases and screening for  fall risk and other medical concerns.    This visit is provided free of charge (no copay) for all Medicare recipients. The clinical pharmacists at [facility] have begun to conduct these Wellness Visits which will also include a thorough review of all your medications.    As you primary medical provider recommend that you make an appointment for your Annual Wellness Visit if you have not done so already this year.  You may set up this appointment before you leave today or you may call back ([phone]) and schedule an appointment.  Please make sure when you call that you mention that you are scheduling your Annual Wellness Visit with the clinical pharmacist so that the appointment may be made for the proper length of time.     Continue current medications. Continue good therapeutic lifestyle changes which include good diet and exercise. Fall precautions discussed with  patient. If an FOBT was given today- please return it to our front desk. If you are over 30 years old - you may need Prevnar 89 or the adult Pneumonia vaccine.  Flu Shots are still available at our office. If you still haven't had one please call to set up a nurse visit to get one.   After your visit with [place] today you will receive a survey in the mail or online from [employer] regarding your care with [place]. Please take a moment to fill this out. Your feedback is very important to [place] as you can help [place] better understand your patient needs as well as improve your experience and satisfaction. WE CARE ABOUT YOU!!!   For the neck pain, the patient should take ibuprofen twice daily after breakfast and supper for the next 2 weeks. If this bothers her stomach she should discontinue it. She should use warm wet compresses 20 minutes 3 or 4 times daily to the neck She should decrease her sodium intake She should wear above the knee support hose and the should be put on her legs prior to getting out of the bed each morning If the neck pain is not better after 2-3 weeks she should come in and get cervical spine films We will get a chest x-ray today and call her with those results as soon as possible Because of the left ankle pain and calf tenderness we will get venous Dopplers to make sure there is no DVT. We will call with the lab work results as soon as it becomes available She should follow-up with the cardiologist as planned in July   Arrie Senate MD

## 2014-10-18 NOTE — Patient Instructions (Addendum)
Medicare Annual Wellness Visit  [facility] and the medical providers at [facility] strive to bring you the best medical care.  In doing so we not only want to address your current medical conditions and concerns but also to detect new conditions early and prevent illness, disease and health-related problems.    Medicare offers a yearly Wellness Visit which allows our clinical staff to assess your need for preventative services including immunizations, lifestyle education, counseling to decrease risk of preventable diseases and screening for fall risk and other medical concerns.    This visit is provided free of charge (no copay) for all Medicare recipients. The clinical pharmacists at [facility] have begun to conduct these Wellness Visits which will also include a thorough review of all your medications.    As you primary medical provider recommend that you make an appointment for your Annual Wellness Visit if you have not done so already this year.  You may set up this appointment before you leave today or you may call back ([phone]) and schedule an appointment.  Please make sure when you call that you mention that you are scheduling your Annual Wellness Visit with the clinical pharmacist so that the appointment may be made for the proper length of time.     Continue current medications. Continue good therapeutic lifestyle changes which include good diet and exercise. Fall precautions discussed with patient. If an FOBT was given today- please return it to our front desk. If you are over 85 years old - you may need Prevnar 16 or the adult Pneumonia vaccine.  Flu Shots are still available at our office. If you still haven't had one please call to set up a nurse visit to get one.   After your visit with [place] today you will receive a survey in the mail or online from [employer] regarding your care with [place]. Please take a moment to  fill this out. Your feedback is very important to [place] as you can help [place] better understand your patient needs as well as improve your experience and satisfaction. WE CARE ABOUT YOU!!!   For the neck pain, the patient should take ibuprofen twice daily after breakfast and supper for the next 2 weeks. If this bothers her stomach she should discontinue it. She should use warm wet compresses 20 minutes 3 or 4 times daily to the neck She should decrease her sodium intake She should wear above the knee support hose and the should be put on her legs prior to getting out of the bed each morning If the neck pain is not better after 2-3 weeks she should come in and get cervical spine films We will get a chest x-ray today and call her with those results as soon as possible Because of the left ankle pain and calf tenderness we will get venous Dopplers to make sure there is no DVT. We will call with the lab work results as soon as it becomes available She should follow-up with the cardiologist as planned in July

## 2014-10-19 ENCOUNTER — Telehealth: Payer: Self-pay | Admitting: *Deleted

## 2014-10-19 ENCOUNTER — Other Ambulatory Visit: Payer: Self-pay | Admitting: *Deleted

## 2014-10-19 ENCOUNTER — Ambulatory Visit (HOSPITAL_COMMUNITY)
Admission: RE | Admit: 2014-10-19 | Discharge: 2014-10-19 | Disposition: A | Discharge status: Home / Self Care | Payer: Medicare Other | Source: Ambulatory Visit | Attending: Family Medicine | Admitting: Family Medicine

## 2014-10-19 DIAGNOSIS — M79605 Pain in left leg: Secondary | ICD-10-CM | POA: Diagnosis not present

## 2014-10-19 DIAGNOSIS — M79662 Pain in left lower leg: Secondary | ICD-10-CM

## 2014-10-19 DIAGNOSIS — M25572 Pain in left ankle and joints of left foot: Secondary | ICD-10-CM

## 2014-10-19 LAB — BMP8+EGFR
BUN/Creatinine Ratio: 24 (ref 11–26)
BUN: 16 mg/dL (ref 8–27)
CALCIUM: 9.8 mg/dL (ref 8.7–10.3)
CO2: 26 mmol/L (ref 18–29)
Chloride: 100 mmol/L (ref 97–108)
Creatinine, Ser: 0.66 mg/dL (ref 0.57–1.00)
GFR calc Af Amer: 97 mL/min/{1.73_m2} (ref >59)
GFR calc non Af Amer: 84 mL/min/{1.73_m2} (ref >59)
Glucose: 93 mg/dL (ref 65–99)
POTASSIUM: 4.1 mmol/L (ref 3.5–5.2)
Sodium: 141 mmol/L (ref 134–144)

## 2014-10-19 LAB — NMR, LIPOPROFILE
Cholesterol: 172 mg/dL (ref 100–199)
HDL CHOLESTEROL BY NMR: 56 mg/dL (ref >39)
HDL Particle Number: 29.1 umol/L — ABNORMAL LOW (ref >=30.5)
LDL PARTICLE NUMBER: 1131 nmol/L — AB (ref <1000)
LDL Size: 21 nm (ref >20.5)
LDL-C: 101 mg/dL — ABNORMAL HIGH (ref 0–99)
LP-IR Score: <25 (ref <=45)
SMALL LDL PARTICLE NUMBER: 292 nmol/L (ref <=527)
Triglycerides by NMR: 74 mg/dL (ref 0–149)

## 2014-10-19 LAB — HEPATIC FUNCTION PANEL
ALT: 15 IU/L (ref 0–32)
AST: 20 IU/L (ref 0–40)
Albumin: 4.2 g/dL (ref 3.5–4.8)
Alkaline Phosphatase: 51 IU/L (ref 39–117)
Bilirubin Total: 0.7 mg/dL (ref 0.0–1.2)
Bilirubin, Direct: 0.21 mg/dL (ref 0.00–0.40)
Total Protein: 7 g/dL (ref 6.0–8.5)

## 2014-10-19 LAB — VITAMIN D 25 HYDROXY (VIT D DEFICIENCY, FRACTURES): Vit D, 25-Hydroxy: 47.7 ng/mL (ref 30.0–100.0)

## 2014-10-19 NOTE — Addendum Note (Signed)
Addended by: Zannie Cove on: 10/19/2014 09:31 AM   Modules accepted: Orders

## 2014-10-19 NOTE — Telephone Encounter (Signed)
-----   Message from Chipper Herb, MD sent at 10/19/2014  8:58 AM EDT ----- The blood sugar is good at 93. The creatinine, the most important kidney function test remains within normal limits. The electrolytes including potassium are good. Liver function tests are within normal limits The total LDL particle number on advanced lipid testing remain slightly elevated and is slightly improved from 4 months ago. The LDL C is lower but still remains slightly elevated. The triglycerides are good and the good cholesterol or the HDL particle number remains low.----- the patient should continue to follow-up aggressive therapeutic lifestyle changes which includes exercise as much as tolerated like walking. And following is close diet as possible. The vitamin D level is good at 47.7 and she should continue with current treatment

## 2014-10-19 NOTE — Telephone Encounter (Signed)
Pt notified of results Verbalizes understanding 

## 2014-10-26 ENCOUNTER — Telehealth: Payer: Self-pay | Admitting: Family Medicine

## 2014-10-26 NOTE — Telephone Encounter (Signed)
Patient has concerns about starting Prolia.  Wants to hold off on starting Prolia.  I discussed risk of fracture and risk of side effects from Prolia.  Will postpone because patient is not feeling well but will revisit in 1 to 2 months.

## 2014-10-27 ENCOUNTER — Ambulatory Visit: Payer: Self-pay

## 2014-10-27 ENCOUNTER — Other Ambulatory Visit: Payer: Self-pay | Admitting: Family Medicine

## 2014-10-27 ENCOUNTER — Other Ambulatory Visit (INDEPENDENT_AMBULATORY_CARE_PROVIDER_SITE_OTHER): Payer: Medicare Other

## 2014-10-27 ENCOUNTER — Other Ambulatory Visit: Payer: Self-pay | Admitting: Pharmacist

## 2014-10-27 ENCOUNTER — Encounter: Payer: Self-pay | Admitting: Pharmacist

## 2014-10-27 ENCOUNTER — Ambulatory Visit (INDEPENDENT_AMBULATORY_CARE_PROVIDER_SITE_OTHER): Payer: Medicare Other | Admitting: Pharmacist

## 2014-10-27 VITALS — BP 128/60 | HR 60 | Ht 64.0 in | Wt 131.0 lb

## 2014-10-27 DIAGNOSIS — R52 Pain, unspecified: Secondary | ICD-10-CM

## 2014-10-27 DIAGNOSIS — Z0000 Encounter for general adult medical examination without abnormal findings: Secondary | ICD-10-CM

## 2014-10-27 DIAGNOSIS — M25572 Pain in left ankle and joints of left foot: Secondary | ICD-10-CM

## 2014-10-27 DIAGNOSIS — M25472 Effusion, left ankle: Secondary | ICD-10-CM

## 2014-10-27 NOTE — Progress Notes (Signed)
Patient ID: Patricia Villa, female   DOB: 08-14-1932, 81 y.o.   MRN: [id number]    Subjective:   Patricia Villa is a 81 y.o. female who presents for an Initial Medicare Annual Wellness Visit  Patient is well appearing in office today. He main complaint is ankle pain which she states is better since our conversation yesterday when I advised her to try taking hydrocodone/APAP 5/325mg  1/2 tablet up to bid as needed for pain.  Current Medications (verified) Outpatient Encounter Prescriptions as of 10/27/2014  Medication Sig  . amLODipine (NORVASC) 2.5 MG tablet Take 1 tablet (2.5 mg total) by mouth daily.  Marland Kitchen aspirin EC 81 MG tablet Take 81 mg by mouth daily.  . Cholecalciferol (VITAMIN D3) 2000 UNITS TABS Take 2,000 Units by mouth daily.  . DULoxetine (CYMBALTA) 60 MG capsule TAKE ONE CAPSULE BY MOUTH ONE TIME DAILY  . estradiol (MINIVELLE) 0.05 MG/24HR patch Place 1 patch onto the skin 2 (two) times a week. Tuesday & Saturday.  Marland Kitchen HYDROcodone-acetaminophen (NORCO/VICODIN) 5-325 MG per tablet Take 0.5 tablets by mouth 2 (two) times daily as needed for moderate pain.  Marland Kitchen lisinopril (PRINIVIL,ZESTRIL) 40 MG tablet TAKE ONE TABLET BY MOUTH ONE TIME DAILY  . metoprolol succinate (TOPROL-XL) 25 MG 24 hr tablet Take 1 tablet (25 mg total) by mouth daily. As directed  . polyethylene glycol (MIRALAX / GLYCOLAX) packet Take 17 g by mouth daily as needed (for constipation.).   No facility-administered encounter medications on file as of 10/27/2014.    Allergies (verified) Actonel; Diphenhydramine hcl; Fosamax; Livalo; Pravastatin; Sulfa antibiotics; and Zocor   History: Past Medical History  Diagnosis Date  . Hypertrophic cardiomyopathy     by history but not identified on the most recent echo  . Mild mitral valve prolapse   . Mitral insufficiency     Mild  . Hypertension   . Meningioma   . Esotropia   . Hyperlipidemia   . Postmenopausal HRT (hormone replacement therapy)   . Arthritis   .  Alopecia   . Diverticulosis of colon   . DDD (degenerative disc disease), cervical   . Anxiety   . Osteoporosis   . Cataract   . History of double vision    Past Surgical History  Procedure Laterality Date  . Abdominal hysterectomy    . Cataract extraction, bilateral    . Eye surgery    . Vein surgery    . Tonsillectomy     Family History  Problem Relation Age of Onset  . Other Brother     IHSS  . COPD Brother   . Diabetes Brother   . Heart disease Brother   . Hyperlipidemia Brother   . Hypertension Brother   . Asthma Mother   . Other Sister     IHSS  . Cancer Cousin     thyroid  . Other Sister     IHSS   Social History   Occupational History  . Not on file.   Social History Main Topics  . Smoking status: Never Smoker   . Smokeless tobacco: Never Used     Comment: Rare previously  . Alcohol Use: No  . Drug Use: No  . Sexual Activity: No   Do you feel safe at home?  Yes  Dietary issues and exercise activities: Current Exercise Habits:: The patient does not participate in regular exercise at present  Current Dietary habits:  Started drinking [employer] about 6 months ago and weight has been  stable.  BMI has remained around 23.  Objective:    Today's Vitals   10/27/14 1217  BP: 128/60  Pulse: 60  Height: 5\' 4"  (1.626 m)  Weight: 131 lb (59.421 kg)  PainSc: 2   PainLoc: Ankle   Body mass index is 22.47 kg/(m^2).  Activities of Daily Living In your present state of health, do you have any difficulty performing the following activities: 10/27/2014  Hearing? Y  Vision? N  Difficulty concentrating or making decisions? N  Walking or climbing stairs? N  Dressing or bathing? N  Doing errands, shopping? N  Preparing Food and eating ? N  Using the Toilet? N  In the past six months, have you accidently leaked urine? Y  Do you have problems with loss of bowel control? N  Managing your Medications? N  Managing your Finances? N    Housekeeping or managing your Housekeeping? N    Are there smokers in your home (other than you)? No   Cardiac Risk Factors include: advanced age (>3men, >60 women);family history of premature cardiovascular disease;hypertension  Depression Screen PHQ 2/9 Scores 10/27/2014 10/18/2014 08/01/2014 06/07/2014  PHQ - 2 Score 0 0 0 0    Fall Risk Fall Risk  10/27/2014 10/18/2014 08/01/2014 06/07/2014 01/24/2014  Falls in the past year? No No No No Yes  Number falls in past yr: - - - - 1  Injury with Fall? - - - - No    Cognitive Function: MMSE - Mini Mental State Exam 10/27/2014  Orientation to time 5  Orientation to Place 5  Registration 3  Attention/ Calculation 3  Recall 3  Language- name 2 objects 2  Language- repeat 1  Language- follow 3 step command 3  Language- read & follow direction 1  Write a sentence 1  Copy design 1  Total score 28    Immunizations and Health Maintenance Immunization History  Administered Date(s) Administered  . Influenza,inj,Quad PF,36+ Mos 02/04/2013, 03/10/2014  . Influenza-Unspecified 03/19/2012  . Pneumococcal Conjugate-13 04/05/2013  . Pneumococcal Polysaccharide-23 04/05/2005  . Tdap 08/05/2010  . Zoster 05/06/2006   Health Maintenance Due  Topic Date Due  . MAMMOGRAM  05/06/2012    Patient Care Team: Chipper Herb, MD as PCP - General (Family Medicine) Minus Breeding, MD (Cardiology) Richmond Campbell, MD (Gastroenterology) Maisie Fus, MD (Obstetrics and Gynecology) Marilynne Halsted, MD as Referring Physician (Ophthalmology)  Indicate any recent Medical Services you may have received from other than [facility] providers in the past year (date may be approximate).    Assessment:    Annual Wellness Visit  Left ankle swelling and pain   Screening Tests Health Maintenance  Topic Date Due  . MAMMOGRAM  05/06/2012  . INFLUENZA VACCINE  12/05/2014  . DEXA SCAN  08/30/2016  . TETANUS/TDAP  08/04/2020  . COLONOSCOPY  03/05/2023  .  ZOSTAVAX  Completed  . PNA vac Low Risk Adult  Completed        Plan:   During the course of the visit Arlee was educated and counseled about the following appropriate screening and preventive services:   Vaccines to include Pneumoccal, Influenza, Hepatitis B, Td, Zostavax - all required vaccines UTD  Colorectal cancer screening - UTD colonoscopy and FOBT  Cardiovascular disease screening - Last ECHO 2014; LDL is elevated but patient is statin intolerant;  BP at goal today.      Diabetes screening - last FBG was WNL  Bone Denisty / Osteoporosis Screening - UTD;  DEXA showed osteoporosis.  Patient has not been able to tolerate bisphosphonates in past.  She is current on estradiol patch.  Plan to get Prolia however patient does not want to start until after issues with ankle is resolved.   Mammogram - done earlier this year - will request record from Dr neal's office  Glaucoma screening /  Eye Exam - last exam 07/20/2013  Nutrition counseling - continue current nutrition supplement.  Weight remains stable.  Advanced Directives  - information packet given  Discussed chair exercises - handout given and explained.     Patient Instructions (the written plan) were given to the patient.   Cherre Robins, [place], CPP, CDE   10/27/2014

## 2014-10-27 NOTE — Patient Instructions (Addendum)
  Patricia Villa , Thank you for taking time to come for your Medicare Wellness Visit. I appreciate your ongoing commitment to your health goals. Please review the following plan we discussed and let me know if I can assist you in the future.   These are the goals we discussed: Continue to drink [employer] Nutrition Drink and maintain current weight.  I have given you some examples for chair exercises to help with maintaining muscle tone.  Please try to do these daily.    This is a list of the screening recommended for you and due dates:  Health Maintenance  Topic Date Due  . Mammogram  2017  . Flu Shot  12/05/2014  . DEXA scan (bone density measurement)  08/30/2016  . Tetanus Vaccine  08/04/2020  . Colon Cancer Screening  03/05/2023  . Shingles Vaccine  Completed  . Pneumonia vaccines  Completed

## 2014-10-28 ENCOUNTER — Telehealth: Payer: Self-pay | Admitting: Family Medicine

## 2014-10-28 NOTE — Telephone Encounter (Signed)
Patient spoke with Stokes. This was handled in another encounter.

## 2014-11-03 ENCOUNTER — Telehealth: Payer: Self-pay | Admitting: Pharmacist

## 2014-11-03 NOTE — Telephone Encounter (Signed)
Please arrange for patient to see the orthopedic surgeon Dr. Doran Durand

## 2014-11-03 NOTE — Telephone Encounter (Signed)
Tammy spoke with patient and she wanted Tammy to let Dr Laurance Flatten know that she is still having problems with left leg swelling and pain. She saw her last week and increased her fluid pill.

## 2014-11-04 ENCOUNTER — Encounter: Payer: Self-pay | Admitting: Physician Assistant

## 2014-11-04 ENCOUNTER — Ambulatory Visit: Payer: Medicare Other | Admitting: Physician Assistant

## 2014-11-04 ENCOUNTER — Ambulatory Visit (INDEPENDENT_AMBULATORY_CARE_PROVIDER_SITE_OTHER): Payer: Medicare Other | Admitting: Physician Assistant

## 2014-11-04 VITALS — BP 137/72 | HR 79 | Temp 97.0°F | Ht 64.0 in | Wt 133.8 lb

## 2014-11-04 DIAGNOSIS — R600 Localized edema: Secondary | ICD-10-CM | POA: Diagnosis not present

## 2014-11-04 MED ORDER — CEPHALEXIN 500 MG PO CAPS: 500.0000 mg | ORAL_CAPSULE | Freq: Two times a day (BID) | ORAL | Status: DC

## 2014-11-04 NOTE — Patient Instructions (Signed)

## 2014-11-04 NOTE — Progress Notes (Signed)
Patient ID: Patricia Villa, female   DOB: 1933-01-09, 81 y.o.   MRN: [id number]  received copy of mammogram from Dr Nori Riis - complete 07/15/2014 - not evidence of malignancy note.

## 2014-11-04 NOTE — Progress Notes (Signed)
Subjective:     Patient ID: Patricia Villa, female   DOB: 1932-06-11, 81 y.o.   MRN: [id number]  HPI Pt here for f/u Seen prev x 2 by [facility] for L lower ext edema Prev Doppler has r/o DVT She has had prev vein stripping to this leg It was recommended she wear support hose but she has not done so as of yet + pain and swelling  Review of Systems     Objective:   Physical Exam + pitting edema to the L lower leg to mid tib Sl TTP of same No skin breakdown noted Sl erythema and induration Mult varicosities present    Assessment:     Lower ext edema    Plan:     Will start on Keflex 500mg  bid Pt's daughter stated they will get the support hose Stay active as possible Keep f/u with [facility]

## 2014-11-09 ENCOUNTER — Telehealth: Payer: Self-pay | Admitting: Family Medicine

## 2014-11-09 ENCOUNTER — Encounter: Payer: Self-pay | Admitting: Cardiology

## 2014-11-09 ENCOUNTER — Ambulatory Visit (INDEPENDENT_AMBULATORY_CARE_PROVIDER_SITE_OTHER): Payer: Medicare Other | Admitting: Family Medicine

## 2014-11-09 ENCOUNTER — Ambulatory Visit (INDEPENDENT_AMBULATORY_CARE_PROVIDER_SITE_OTHER): Payer: Medicare Other | Admitting: Cardiology

## 2014-11-09 ENCOUNTER — Encounter: Payer: Self-pay | Admitting: Family Medicine

## 2014-11-09 VITALS — BP 129/83 | HR 71 | Temp 97.1°F | Ht 64.0 in | Wt 133.0 lb

## 2014-11-09 VITALS — BP 130/68 | HR 73 | Ht 64.0 in | Wt 134.0 lb

## 2014-11-09 DIAGNOSIS — I4891 Unspecified atrial fibrillation: Secondary | ICD-10-CM | POA: Diagnosis not present

## 2014-11-09 DIAGNOSIS — I059 Rheumatic mitral valve disease, unspecified: Secondary | ICD-10-CM

## 2014-11-09 DIAGNOSIS — M79605 Pain in left leg: Secondary | ICD-10-CM

## 2014-11-09 DIAGNOSIS — R0602 Shortness of breath: Secondary | ICD-10-CM

## 2014-11-09 DIAGNOSIS — I080 Rheumatic disorders of both mitral and aortic valves: Secondary | ICD-10-CM | POA: Diagnosis not present

## 2014-11-09 DIAGNOSIS — I10 Essential (primary) hypertension: Secondary | ICD-10-CM

## 2014-11-09 DIAGNOSIS — I872 Venous insufficiency (chronic) (peripheral): Secondary | ICD-10-CM | POA: Diagnosis not present

## 2014-11-09 DIAGNOSIS — M7989 Other specified soft tissue disorders: Secondary | ICD-10-CM

## 2014-11-09 MED ORDER — APIXABAN 5 MG PO TABS: 5.0000 mg | ORAL_TABLET | Freq: Two times a day (BID) | ORAL | Status: DC

## 2014-11-09 MED ORDER — CEPHALEXIN 500 MG PO CAPS: 500.0000 mg | ORAL_CAPSULE | Freq: Two times a day (BID) | ORAL | Status: DC

## 2014-11-09 MED ORDER — CEPHALEXIN 500 MG PO CAPS: 500.0000 mg | ORAL_CAPSULE | Freq: Three times a day (TID) | ORAL | Status: DC

## 2014-11-09 NOTE — Progress Notes (Signed)
HPI The patient presents for yearly followup. Since I last saw her she has had no new specific cardiovascular complaints. She has multiple vague complaints. She might have some increasing shortness of breath with activities. She's not describing any PND or orthopnea.  She's not noticing any palpitations, presyncope or syncope. She has some neck discomfort which is thought to be skeletal and has been treated but still bothers her. Her biggest complaint has been swelling in her left leg. She has tenderness in her anterior tibial region and erythema. She did have an ultrasound which demonstrated no DVT. She's been treated with Keflex for about for 5 days but hasn't had improvement on this.  Allergies  Allergen Reactions  . Actonel [Risedronate Sodium] Other (See Comments)    Esophagitis   . Diphenhydramine Hcl Hives  . Fosamax [Alendronate Sodium] Other (See Comments)    Esophagitis   . Livalo [Pitavastatin Calcium] Other (See Comments)    Pt doesn't remember reaction  . Pravastatin Other (See Comments)    Extreme leg cramping.  . Sulfa Antibiotics Other (See Comments)    Pt doesn't remember reaction  . Zocor [Simvastatin] Other (See Comments)    Pt doesn't remember reaction    Current Outpatient Prescriptions  Medication Sig Dispense Refill  . amLODipine (NORVASC) 2.5 MG tablet Take 1 tablet (2.5 mg total) by mouth daily. 90 tablet 2  . aspirin EC 81 MG tablet Take 81 mg by mouth daily.    . cephALEXin (KEFLEX) 500 MG capsule Take 1 capsule (500 mg total) by mouth 2 (two) times daily. 14 capsule 0  . Cholecalciferol (VITAMIN D3) 2000 UNITS TABS Take 2,000 Units by mouth daily.    . DULoxetine (CYMBALTA) 60 MG capsule TAKE ONE CAPSULE BY MOUTH ONE TIME DAILY 30 capsule 5  . estradiol (MINIVELLE) 0.05 MG/24HR patch Place 1 patch onto the skin 2 (two) times a week. Tuesday & Saturday.    Marland Kitchen HYDROcodone-acetaminophen (NORCO/VICODIN) 5-325 MG per tablet Take 0.5 tablets by mouth 2 (two)  times daily as needed for moderate pain.    Marland Kitchen lisinopril (PRINIVIL,ZESTRIL) 40 MG tablet TAKE ONE TABLET BY MOUTH ONE TIME DAILY 30 tablet 5  . metoprolol succinate (TOPROL-XL) 25 MG 24 hr tablet Take 1 tablet (25 mg total) by mouth daily. As directed 90 tablet 3  . polyethylene glycol (MIRALAX / GLYCOLAX) packet Take 17 g by mouth daily as needed (for constipation.).     No current facility-administered medications for this visit.    Past Medical History  Diagnosis Date  . Hypertrophic cardiomyopathy     by history but not identified on the most recent echo  . Mild mitral valve prolapse   . Mitral insufficiency     Mild  . Hypertension   . Meningioma   . Esotropia   . Hyperlipidemia   . Postmenopausal HRT (hormone replacement therapy)   . Arthritis   . Alopecia   . Diverticulosis of colon   . DDD (degenerative disc disease), cervical   . Anxiety   . Osteoporosis   . Cataract   . History of double vision     Past Surgical History  Procedure Laterality Date  . Abdominal hysterectomy    . Cataract extraction, bilateral    . Eye surgery    . Vein surgery    . Tonsillectomy      ROS:  As stated in the HPI and negative for all other systems.  PHYSICAL EXAM BP 130/68 mmHg  Pulse  73  Ht 5\' 4"  (1.626 m)  Wt 134 lb (60.782 kg)  BMI 22.99 kg/m2  LMP 08/01/1982 GENERAL:  Well appearing NECK:  No jugular venous distention, waveform within normal limits, right soft carotid bruits, no thyromegaly LUNGS:  Clear to auscultation bilaterally BACK:  No CVA tenderness CHEST:  Unremarkable HEART:  PMI not displaced or sustained,S1 and S2 within normal limits, no S3, no S4, no clicks, no rubs, apical holosystolic murmur 3/6 ABD:  Flat, positive bowel sounds normal in frequency in pitch, no bruits, no rebound, no guarding, no midline pulsatile mass, no hepatomegaly, no splenomegaly EXT:  2 plus pulses throughout, left greater than right edema with calf swelling and tenderness and  erythema on the left anterior tibial,  no cyanosis no clubbing, varicose veins  EKG:  Atrial fib, rate 73,  left axis deviation, first degree AV block, no significant change.  11/09/2014  ASSESSMENT AND PLAN  ATRIAL FIB: This is new onset. She does not really notice this. I'm going to check a TSH and CBC. She has no contraindication to anticoagulation and we talked about the risks benefits of this. I'm going to start with Eliquis although she might ultimately only be able to afford warfarin. I will check an echocardiogram. I will place a 24-hour Holter.  MITRAL INSUFFICIENCY:  The patient has no new symptoms. I would check an echocardiogram to follow-up on this.  CAROTID STENOSIS:  This was mild and no further imaging is indicated at present.  HTN:  The blood pressure is at target. No change in medications is indicated. We will continue with therapeutic lifestyle changes (TLC).  LEG SWELLING:  I spoke with Dr. Laurance Flatten. The patient has had a negative Doppler for DVT. However, she certainly has swelling and erythema and discomfort on his leg despite the antibiotics. She will go over and be seen today by Dr. Laurance Flatten.

## 2014-11-09 NOTE — Patient Instructions (Addendum)
We will arrange a visit with the vascular surgeon - hopefully this week. Please keep the leg elevated as much as possible Continue all current meds including the antibiotic, Keflex -- 3 times a day Continue with blood thinner as recommended by cardiology

## 2014-11-09 NOTE — Progress Notes (Signed)
Subjective:    Patient ID: Patricia Villa, female    DOB: May 09, 1932, 81 y.o.   MRN: [id number]  HPI Patient here today for redness, heat and swelling in the left lower leg. The patient saw the cardiologist this morning. She has new onset atrial fibrillation. After seeing her he was concerned about the continued redness in her leg and infection. It is noted that she's had recent Dopplers that were negative for DVT. She is still on Keflex from the mid-level in the office.     Patient Active Problem List   Diagnosis Date Noted  . Osteoporosis 08/31/2014  . Chronic neck pain 06/07/2014  . DDD (degenerative disc disease), cervical 01/24/2014  . Hyperlipidemia 05/17/2013  . Colitis 01/20/2013  . Generalized anxiety disorder 08/05/2012  . Meningioma 08/05/2012  . Bruit 10/17/2010  . Essential hypertension, benign 10/04/2009  . PALPITATIONS 10/04/2009  . MITRAL INSUFFICIENCY 09/26/2009  . Mitral valve disorder 09/26/2009  . Hypertrophic obstructive cardiomyopathy(425.11) 09/26/2009   Outpatient Encounter Prescriptions as of 11/09/2014  Medication Sig  . amLODipine (NORVASC) 2.5 MG tablet Take 1 tablet (2.5 mg total) by mouth daily.  Marland Kitchen apixaban (ELIQUIS) 5 MG TABS tablet Take 1 tablet (5 mg total) by mouth 2 (two) times daily.  . cephALEXin (KEFLEX) 500 MG capsule Take 1 capsule (500 mg total) by mouth 2 (two) times daily.  . Cholecalciferol (VITAMIN D3) 2000 UNITS TABS Take 2,000 Units by mouth daily.  . DULoxetine (CYMBALTA) 60 MG capsule TAKE ONE CAPSULE BY MOUTH ONE TIME DAILY  . estradiol (MINIVELLE) 0.05 MG/24HR patch Place 1 patch onto the skin 2 (two) times a week. Tuesday & Saturday.  Marland Kitchen HYDROcodone-acetaminophen (NORCO/VICODIN) 5-325 MG per tablet Take 0.5 tablets by mouth 2 (two) times daily as needed for moderate pain.  Marland Kitchen lisinopril (PRINIVIL,ZESTRIL) 40 MG tablet TAKE ONE TABLET BY MOUTH ONE TIME DAILY  . metoprolol succinate (TOPROL-XL) 25 MG 24 hr tablet Take 1 tablet (25  mg total) by mouth daily. As directed  . polyethylene glycol (MIRALAX / GLYCOLAX) packet Take 17 g by mouth daily as needed (for constipation.).   No facility-administered encounter medications on file as of 11/09/2014.      Review of Systems  Constitutional: Negative.   HENT: Negative.   Eyes: Negative.   Respiratory: Negative.   Cardiovascular: Positive for leg swelling (left lower ).  Gastrointestinal: Negative.   Endocrine: Negative.   Genitourinary: Negative.   Musculoskeletal: Negative.   Skin: Negative.        Left lower leg red, heat and swelling  Allergic/Immunologic: Negative.   Neurological: Negative.   Hematological: Negative.   Psychiatric/Behavioral: Negative.        Objective:   Physical Exam  Constitutional: She is oriented to person, place, and time. She appears well-developed and well-nourished. No distress.  HENT:  Head: Normocephalic.  Eyes: Conjunctivae and EOM are normal. Pupils are equal, round, and reactive to light. Right eye exhibits no discharge. Left eye exhibits no discharge. No scleral icterus.  Neck: Normal range of motion.  Musculoskeletal: Normal range of motion. She exhibits edema and tenderness.  Neurological: She is alert and oriented to person, place, and time.  Skin: Skin is warm and dry. No rash noted. There is erythema. No pallor.  The patient has edema swelling and mild erythema and warmth to the left lower leg medial calf area. This area is tender to palpation. She has had previous Dopplers that were negative.  Psychiatric: She has a normal mood  and affect. Her behavior is normal. Judgment and thought content normal.  Nursing note and vitals reviewed.  BP 129/83 mmHg  Pulse 71  Temp(Src) 97.1 F (36.2 C) (Oral)  Ht 5\' 4"  (1.626 m)  Wt 133 lb (60.328 kg)  BMI 22.82 kg/m2  LMP 08/01/1982        Assessment & Plan:  1. Left leg swelling -Continue with Keflex until visit with vascular surgery -Continue with blood thinner as  prescribed by cardiology - Ambulatory referral to Vascular Surgery  2. Left leg pain -Elevate the leg when possible a couple times a day for at least 20 minutes higher than the level of your heart - Ambulatory referral to Vascular Surgery  3. Venous (peripheral) insufficiency -Continue with support hose - Ambulatory referral to Vascular Surgery  4. New onset atrial fibrillation -Follow-up with cardiology as planned for further studies - Ambulatory referral to Vascular Surgery  Meds ordered this encounter  Medications  . DISCONTD: cephALEXin (KEFLEX) 500 MG capsule    Sig: Take 1 capsule (500 mg total) by mouth 2 (two) times daily.    Dispense:  14 capsule    Refill:  1  . cephALEXin (KEFLEX) 500 MG capsule    Sig: Take 1 capsule (500 mg total) by mouth 3 (three) times daily.    Dispense:  30 capsule    Refill:  0   Patient Instructions  We will arrange a visit with the vascular surgeon - hopefully this week. Please keep the leg elevated as much as possible Continue all current meds including the antibiotic, Keflex -- 3 times a day Continue with blood thinner as recommended by cardiology   Arrie Senate MD

## 2014-11-09 NOTE — Patient Instructions (Signed)
Medication Instructions:  Please stop Asprin.  Start Eliquis 5 mg twice a day. Continue all other medications as listed.   Labwork: Please have blood work today at [facility] (TSH, CBC and BNP).  Testing/Procedures: Your physician has recommended that you wear a holter monitor. Holter monitors are medical devices that record the heart's electrical activity. Doctors most often use these monitors to diagnose arrhythmias. Arrhythmias are problems with the speed or rhythm of the heartbeat. The monitor is a small, portable device. You can wear one while you do your normal daily activities. This is usually used to diagnose what is causing palpitations/syncope (passing out).  Your physician has requested that you have an echocardiogram. Echocardiography is a painless test that uses sound waves to create images of your heart. It provides your doctor with information about the size and shape of your heart and how well your heart's chambers and valves are working. This procedure takes approximately one hour. There are no restrictions for this procedure.   Follow-Up: Follow up in [facility] office with Dr Percival Spanish after testing.  Thank you for choosing [facility]!!

## 2014-11-10 ENCOUNTER — Encounter: Payer: Self-pay | Admitting: Vascular Surgery

## 2014-11-10 ENCOUNTER — Ambulatory Visit (INDEPENDENT_AMBULATORY_CARE_PROVIDER_SITE_OTHER): Payer: Medicare Other | Admitting: Vascular Surgery

## 2014-11-10 VITALS — BP 116/70 | HR 64 | Resp 14 | Ht 64.0 in | Wt 132.0 lb

## 2014-11-10 DIAGNOSIS — R600 Localized edema: Secondary | ICD-10-CM | POA: Diagnosis not present

## 2014-11-10 LAB — CBC WITH DIFFERENTIAL/PLATELET
BASOS ABS: 0.1 10*3/uL (ref 0.0–0.2)
Basos: 1 %
EOS (ABSOLUTE): 0.4 10*3/uL (ref 0.0–0.4)
Eos: 5 %
HEMOGLOBIN: 12.1 g/dL (ref 11.1–15.9)
Hematocrit: 35.9 % (ref 34.0–46.6)
IMMATURE GRANULOCYTES: 0 %
Immature Grans (Abs): 0 10*3/uL (ref 0.0–0.1)
Lymphocytes Absolute: 2 10*3/uL (ref 0.7–3.1)
Lymphs: 27 %
MCH: 31.1 pg (ref 26.6–33.0)
MCHC: 33.7 g/dL (ref 31.5–35.7)
MCV: 92 fL (ref 79–97)
MONOCYTES: 12 %
Monocytes Absolute: 0.9 10*3/uL (ref 0.1–0.9)
NEUTROS ABS: 4.1 10*3/uL (ref 1.4–7.0)
Neutrophils: 55 %
Platelets: 305 10*3/uL (ref 150–379)
RBC: 3.89 x10E6/uL (ref 3.77–5.28)
RDW: 13.8 % (ref 12.3–15.4)
WBC: 7.3 10*3/uL (ref 3.4–10.8)

## 2014-11-10 LAB — BRAIN NATRIURETIC PEPTIDE: BNP: 419.6 pg/mL — AB (ref 0.0–100.0)

## 2014-11-10 LAB — TSH: TSH: 0.927 u[IU]/mL (ref 0.450–4.500)

## 2014-11-10 NOTE — Progress Notes (Signed)
Patient name: Patricia Villa MRN: [id number] DOB: [birth year] Sex: female   Referred by: Christell Constant  Reason for referral:  Chief Complaint  Patient presents with  . New Evaluation    left leg swelling and pain    HISTORY OF PRESENT ILLNESS: Patient resents today for evaluation of swelling of her left lower from the. This is been present and progressive for approximately 2 months. She does have a long history of scattered varicosities and telangiectasia over both lower femoris. She had had erythema and swelling at the level of her left calf. She did have a duplex which I reviewed from 10/19/2014 and this showed no evidence of DVT and no evidence of superficial thrombophlebitis. She continues to have discomfort and swelling. Did have a course of oral anabiotic rule out cellulitis with minimal response from this. She does have a recent diagnosis of atrial fibrillation and is now on oral anticoagulation for this reason. No history of trauma to this area. Does report that the swelling is less when she first arises in the morning and is progressive throughout the day. Has not worn compression garments with this.  Past Medical History  Diagnosis Date  . Hypertrophic cardiomyopathy     by history but not identified on the most recent echo  . Mild mitral valve prolapse   . Mitral insufficiency     Mild  . Hypertension   . Meningioma   . Esotropia   . Hyperlipidemia   . Postmenopausal HRT (hormone replacement therapy)   . Arthritis   . Alopecia   . Diverticulosis of colon   . DDD (degenerative disc disease), cervical   . Anxiety   . Osteoporosis   . Cataract   . History of double vision     Past Surgical History  Procedure Laterality Date  . Abdominal hysterectomy    . Cataract extraction, bilateral    . Eye surgery    . Vein surgery    . Tonsillectomy      History   Social History  . Marital Status: Married    Spouse Name: N/A  . Number of Children: N/A  . Years of  Education: N/A   Occupational History  . Not on file.   Social History Main Topics  . Smoking status: Never Smoker   . Smokeless tobacco: Never Used     Comment: Rare previously  . Alcohol Use: No  . Drug Use: No  . Sexual Activity: No   Other Topics Concern  . Not on file   Social History Narrative    Family History  Problem Relation Age of Onset  . Other Brother     IHSS  . COPD Brother   . Diabetes Brother   . Heart disease Brother   . Hyperlipidemia Brother   . Hypertension Brother   . Asthma Mother   . Other Sister     IHSS  . Cancer Cousin     thyroid  . Other Sister     IHSS    Allergies as of 11/10/2014 - Review Complete 11/10/2014  Allergen Reaction Noted  . Actonel [risedronate sodium] Other (See Comments) 08/31/2014  . Diphenhydramine hcl Hives   . Fosamax [alendronate sodium] Other (See Comments) 08/31/2014  . Livalo [pitavastatin calcium] Other (See Comments) 10/17/2010  . Pravastatin Other (See Comments) 04/18/2014  . Sulfa antibiotics Other (See Comments) 05/15/2012  . Zocor [simvastatin] Other (See Comments) 05/15/2012    Current Outpatient Prescriptions on File Prior to Visit  Medication Sig Dispense Refill  . amLODipine (NORVASC) 2.5 MG tablet Take 1 tablet (2.5 mg total) by mouth daily. 90 tablet 2  . apixaban (ELIQUIS) 5 MG TABS tablet Take 1 tablet (5 mg total) by mouth 2 (two) times daily. 60 tablet 11  . cephALEXin (KEFLEX) 500 MG capsule Take 1 capsule (500 mg total) by mouth 3 (three) times daily. 30 capsule 0  . Cholecalciferol (VITAMIN D3) 2000 UNITS TABS Take 2,000 Units by mouth daily.    . DULoxetine (CYMBALTA) 60 MG capsule TAKE ONE CAPSULE BY MOUTH ONE TIME DAILY 30 capsule 5  . estradiol (MINIVELLE) 0.05 MG/24HR patch Place 1 patch onto the skin 2 (two) times a week. Tuesday & Saturday.    Marland Kitchen HYDROcodone-acetaminophen (NORCO/VICODIN) 5-325 MG per tablet Take 0.5 tablets by mouth 2 (two) times daily as needed for moderate pain.      Marland Kitchen lisinopril (PRINIVIL,ZESTRIL) 40 MG tablet TAKE ONE TABLET BY MOUTH ONE TIME DAILY 30 tablet 5  . metoprolol succinate (TOPROL-XL) 25 MG 24 hr tablet Take 1 tablet (25 mg total) by mouth daily. As directed 90 tablet 3  . polyethylene glycol (MIRALAX / GLYCOLAX) packet Take 17 g by mouth daily as needed (for constipation.).     No current facility-administered medications on file prior to visit.     REVIEW OF SYSTEMS:  Positives indicated with an "X"  CARDIOVASCULAR:  [ ]  chest pain   [ ]  chest pressure   [ ]  palpitations   [ ]  orthopnea   [ ]  dyspnea on exertion   [ ]  claudication   [ ]  rest pain   [ ]  DVT   [ ]  phlebitis PULMONARY:   [ ]  productive cough   [ ]  asthma   [ ]  wheezing NEUROLOGIC:   [ ]  weakness  [ ]  paresthesias  [ ]  aphasia  [ ]  amaurosis  [x ] dizziness HEMATOLOGIC:   [ ]  bleeding problems   [ ]  clotting disorders MUSCULOSKELETAL:  [ ]  joint pain   [ ]  joint swelling GASTROINTESTINAL: [ ]   blood in stool  [ ]   hematemesis GENITOURINARY:  [x ]  dysuria  [ ]   hematuria PSYCHIATRIC:  [x ] history of major depression INTEGUMENTARY:  [ ]  rashes  [ ]  ulcers CONSTITUTIONAL:  [ ]  fever   [ ]  chills  PHYSICAL EXAMINATION:  General: The patient is a well-nourished female, in no acute distress. Vital signs are BP 116/70 mmHg  Pulse 64  Resp 14  Ht 5\' 4"  (1.626 m)  Wt 132 lb (59.875 kg)  BMI 22.65 kg/m2  LMP 08/01/1982 Pulmonary: There is a good air exchange  Abdomen: Soft and non-tender  Musculoskeletal: There are no major deformities.  There is no significant extremity pain. Neurologic: No focal weakness or paresthesias are detected, Skin: Mild erythema diffusely over her left calf. Multiple pronounced telangiectasia extending onto her feet bilaterally with scattered varicosities mainly from the knees distally bilaterally Psychiatric: The patient has normal affect. Cardiovascular: 2+ popliteal pulses bilaterally. 2+ posterior tibial pulses bilaterally    Vascular  Lab Studies:  Venous duplex from 10/18/2013 negative for deep or superficial thrombus  Impression and Plan:  Discussed that the swelling with patient and her family present. Do not see any evidence of venous pathology to cause this. I imaged her leg and calf with SonoSite ultrasound she does have a great deal of interstitial fluid in her calf. Does have some scattered varicosities but no other evidence of venous pathology  to explain this. Also explained the risk of embolus from her atrial fibrillation. She does have normal posterior tibial pulses bilaterally as of this is not arterial insufficiency. I did explain the importance of elevation and compression suspect this will run its course. She has too much tenderness currently for compression garments. We have wrapped her today with a 4 inch Ace from her foot to her calf and explained the importance of this. She is also fitted for knee-high compression begin use once the tenderness has resolved to prevent recurrence of the swelling. She was reassured with this discussion will see [place] again on as-needed basis    Fauna Neuner [facility] Office: [phone]

## 2014-11-17 ENCOUNTER — Ambulatory Visit (INDEPENDENT_AMBULATORY_CARE_PROVIDER_SITE_OTHER): Payer: Medicare Other

## 2014-11-17 ENCOUNTER — Ambulatory Visit (HOSPITAL_COMMUNITY): Payer: Medicare Other | Attending: Internal Medicine

## 2014-11-17 ENCOUNTER — Other Ambulatory Visit: Payer: Self-pay

## 2014-11-17 DIAGNOSIS — I351 Nonrheumatic aortic (valve) insufficiency: Secondary | ICD-10-CM | POA: Diagnosis not present

## 2014-11-17 DIAGNOSIS — I080 Rheumatic disorders of both mitral and aortic valves: Secondary | ICD-10-CM

## 2014-11-17 DIAGNOSIS — I059 Rheumatic mitral valve disease, unspecified: Secondary | ICD-10-CM

## 2014-11-17 DIAGNOSIS — I4891 Unspecified atrial fibrillation: Secondary | ICD-10-CM

## 2014-11-17 DIAGNOSIS — I371 Nonrheumatic pulmonary valve insufficiency: Secondary | ICD-10-CM | POA: Insufficient documentation

## 2014-11-17 DIAGNOSIS — I340 Nonrheumatic mitral (valve) insufficiency: Secondary | ICD-10-CM | POA: Diagnosis not present

## 2014-11-17 DIAGNOSIS — I10 Essential (primary) hypertension: Secondary | ICD-10-CM | POA: Diagnosis not present

## 2014-11-17 DIAGNOSIS — I071 Rheumatic tricuspid insufficiency: Secondary | ICD-10-CM | POA: Diagnosis not present

## 2014-11-23 ENCOUNTER — Ambulatory Visit (INDEPENDENT_AMBULATORY_CARE_PROVIDER_SITE_OTHER): Payer: Medicare Other | Admitting: Cardiology

## 2014-11-23 ENCOUNTER — Encounter: Payer: Self-pay | Admitting: Cardiology

## 2014-11-23 VITALS — BP 109/68 | HR 57 | Ht 64.0 in | Wt 133.0 lb

## 2014-11-23 DIAGNOSIS — I481 Persistent atrial fibrillation: Secondary | ICD-10-CM

## 2014-11-23 DIAGNOSIS — Z79899 Other long term (current) drug therapy: Secondary | ICD-10-CM

## 2014-11-23 DIAGNOSIS — I4891 Unspecified atrial fibrillation: Secondary | ICD-10-CM | POA: Insufficient documentation

## 2014-11-23 DIAGNOSIS — I4819 Other persistent atrial fibrillation: Secondary | ICD-10-CM

## 2014-11-23 DIAGNOSIS — I421 Obstructive hypertrophic cardiomyopathy: Secondary | ICD-10-CM

## 2014-11-23 MED ORDER — FUROSEMIDE 20 MG PO TABS: 20.0000 mg | ORAL_TABLET | Freq: Every day | ORAL | Status: DC

## 2014-11-23 NOTE — Patient Instructions (Signed)
Medication Instructions:  Please start Furosemide 20 mg once a day. Continue all other medications as listed.  Labwork: Please have blood work in 7 days at [facility]. [facility])  Follow-Up: Follow up in 2 months with Dr Percival Spanish in [place].  Thank you for choosing [facility]!!

## 2014-11-23 NOTE — Progress Notes (Signed)
HPI The patient presents for followup of MR and atrial fib.  When I saw her recently she was in new onset atrial fibrillation.  She did have some dyspnea. An echocardiogram was repeated to follow-up mitral regurgitation which was moderate and was increased from previous.  She had severe left atrial enlargement. There was some elevated pulmonary pressures which was not severe. When I last saw the patient she had some erythema and swelling of her legs that had not completely respond to antibiotics.  She did see [facility] and was managed conservatively.   At that visit I did start anticoagulation. She had a BNP level that was a little bit greater than 400.   Holter monitor demonstrated that she was in atrial fibrillation persistently but with good rate control. She returns for follow-up. She doesn't feel any palpitations. She does have some dyspnea with activity such as bending over. She's not describing any resting shortness of breath and has no PND or orthopnea however. He's not having any chest pressure, neck or arm discomfort. She's had no weight gain or edema.   Allergies  Allergen Reactions  . Actonel [Risedronate Sodium] Other (See Comments)    Esophagitis   . Diphenhydramine Hcl Hives  . Fosamax [Alendronate Sodium] Other (See Comments)    Esophagitis   . Livalo [Pitavastatin Calcium] Other (See Comments)    Pt doesn't remember reaction  . Pravastatin Other (See Comments)    Extreme leg cramping.  . Sulfa Antibiotics Other (See Comments)    Pt doesn't remember reaction  . Zocor [Simvastatin] Other (See Comments)    Pt doesn't remember reaction    Current Outpatient Prescriptions  Medication Sig Dispense Refill  . amLODipine (NORVASC) 2.5 MG tablet Take 1 tablet (2.5 mg total) by mouth daily. 90 tablet 2  . apixaban (ELIQUIS) 5 MG TABS tablet Take 1 tablet (5 mg total) by mouth 2 (two) times daily. 60 tablet 11  . cephALEXin (KEFLEX) 500 MG capsule Take 1 capsule (500 mg total) by  mouth 3 (three) times daily. 30 capsule 0  . Cholecalciferol (VITAMIN D3) 2000 UNITS TABS Take 2,000 Units by mouth daily.    . DULoxetine (CYMBALTA) 60 MG capsule TAKE ONE CAPSULE BY MOUTH ONE TIME DAILY 30 capsule 5  . estradiol (MINIVELLE) 0.05 MG/24HR patch Place 1 patch onto the skin 2 (two) times a week. Tuesday & Saturday.    Marland Kitchen HYDROcodone-acetaminophen (NORCO/VICODIN) 5-325 MG per tablet Take 0.5 tablets by mouth 2 (two) times daily as needed for moderate pain.    Marland Kitchen lisinopril (PRINIVIL,ZESTRIL) 40 MG tablet TAKE ONE TABLET BY MOUTH ONE TIME DAILY 30 tablet 5  . metoprolol succinate (TOPROL-XL) 25 MG 24 hr tablet Take 1 tablet (25 mg total) by mouth daily. As directed 90 tablet 3  . polyethylene glycol (MIRALAX / GLYCOLAX) packet Take 17 g by mouth daily as needed (for constipation.).     No current facility-administered medications for this visit.    Past Medical History  Diagnosis Date  . Hypertrophic cardiomyopathy     by history but not identified on the most recent echo  . Mild mitral valve prolapse   . Mitral insufficiency     Mild  . Hypertension   . Meningioma   . Esotropia   . Hyperlipidemia   . Postmenopausal HRT (hormone replacement therapy)   . Arthritis   . Alopecia   . Diverticulosis of colon   . DDD (degenerative disc disease), cervical   . Anxiety   .  Osteoporosis   . Cataract   . History of double vision     Past Surgical History  Procedure Laterality Date  . Abdominal hysterectomy    . Cataract extraction, bilateral    . Eye surgery    . Vein surgery    . Tonsillectomy      ROS:  As stated in the HPI and negative for all other systems.  PHYSICAL EXAM BP 109/68 mmHg  Pulse 57  Ht 5\' 4"  (1.626 m)  Wt 133 lb (60.328 kg)  BMI 22.82 kg/m2  LMP 08/01/1982 GENERAL:  Well appearing NECK:  No jugular venous distention, waveform within normal limits, right soft carotid bruits, no thyromegaly LUNGS:  Clear to auscultation bilaterally BACK:  No CVA  tenderness CHEST:  Unremarkable HEART:  PMI not displaced or sustained,S1 and S2 within normal limits, no S3, no clicks, no rubs, apical holosystolic murmur 3/6, irregular ABD:  Flat, positive bowel sounds normal in frequency in pitch, no bruits, no rebound, no guarding, no midline pulsatile mass, no hepatomegaly, no splenomegaly EXT:  2 plus pulses throughout, left greater than right edema improved.  Left pressure stocking.     ASSESSMENT AND PLAN  ATRIAL FIB:  The patient tolerates this. She's tolerating anticoagulation. She has reasonable rate control. Given the size of the left atrium I would suspect that cardioversion would either be unsuccessful or not successful long-term. She is asymptomatic with this rhythm predominantly. Therefore, we will continue with rate control and anticoagulation.  MITRAL INSUFFICIENCY:  The patient does have mitral insufficiency that is progressed.  I will likely follow this with  another echocardiogram in about 6 months. Because she does have a slightly elevated BNP and some dyspnea I will go ahead and give her Lasix 20 mg daily to see if this helps with any of her symptoms. She will have a basic metabolic profile drawn in 7 days.  CAROTID STENOSIS:  This was mild and no further imaging is indicated at present.  HTN:  The blood pressure is at target. No change in medications is indicated. We will continue with therapeutic lifestyle changes (TLC).

## 2014-11-28 ENCOUNTER — Telehealth: Payer: Self-pay | Admitting: Family Medicine

## 2014-11-28 NOTE — Telephone Encounter (Signed)
Pt has a rash on her neck Also having some swelling in legs Offered appt She will call back tomorrow if sxs persist

## 2014-11-29 ENCOUNTER — Telehealth: Payer: Self-pay | Admitting: Family Medicine

## 2014-11-29 NOTE — Telephone Encounter (Signed)
Aware,  She will need a follow up chest xray per Dr. Laurance Flatten.

## 2014-11-30 ENCOUNTER — Other Ambulatory Visit (INDEPENDENT_AMBULATORY_CARE_PROVIDER_SITE_OTHER): Payer: Medicare Other

## 2014-11-30 ENCOUNTER — Other Ambulatory Visit: Payer: Self-pay | Admitting: *Deleted

## 2014-11-30 DIAGNOSIS — Z79899 Other long term (current) drug therapy: Secondary | ICD-10-CM

## 2014-11-30 DIAGNOSIS — I4819 Other persistent atrial fibrillation: Secondary | ICD-10-CM

## 2014-11-30 DIAGNOSIS — I481 Persistent atrial fibrillation: Secondary | ICD-10-CM

## 2014-11-30 DIAGNOSIS — J90 Pleural effusion, not elsewhere classified: Secondary | ICD-10-CM | POA: Diagnosis not present

## 2014-11-30 DIAGNOSIS — I421 Obstructive hypertrophic cardiomyopathy: Secondary | ICD-10-CM | POA: Diagnosis not present

## 2014-11-30 NOTE — Progress Notes (Signed)
Lab only 

## 2014-12-01 ENCOUNTER — Telehealth: Payer: Self-pay

## 2014-12-01 LAB — BMP8+EGFR
BUN/Creatinine Ratio: 23 (ref 11–26)
BUN: 18 mg/dL (ref 8–27)
CHLORIDE: 97 mmol/L (ref 97–108)
CO2: 26 mmol/L (ref 18–29)
CREATININE: 0.8 mg/dL (ref 0.57–1.00)
Calcium: 9.7 mg/dL (ref 8.7–10.3)
GFR calc Af Amer: 81 mL/min/{1.73_m2} (ref >59)
GFR calc non Af Amer: 70 mL/min/{1.73_m2} (ref >59)
GLUCOSE: 98 mg/dL (ref 65–99)
Potassium: 4.6 mmol/L (ref 3.5–5.2)
Sodium: 138 mmol/L (ref 134–144)

## 2014-12-01 NOTE — Telephone Encounter (Signed)
Please call her about leg

## 2014-12-01 NOTE — Telephone Encounter (Signed)
Pt states that leg is still swelling and sore  She is wearing a stocking on it.  She has finished all antibiotic   i asked that she call Dr Luther Parody office and ask their suggestion and maybe get a f/u appt.

## 2014-12-05 ENCOUNTER — Ambulatory Visit (INDEPENDENT_AMBULATORY_CARE_PROVIDER_SITE_OTHER): Payer: Medicare Other | Admitting: Family

## 2014-12-05 ENCOUNTER — Encounter: Payer: Self-pay | Admitting: Family

## 2014-12-05 VITALS — BP 119/64 | HR 78 | Temp 98.2°F | Ht 64.0 in | Wt 129.0 lb

## 2014-12-05 DIAGNOSIS — L259 Unspecified contact dermatitis, unspecified cause: Secondary | ICD-10-CM

## 2014-12-05 MED ORDER — METHYLPREDNISOLONE 4 MG PO TBPK: ORAL_TABLET | ORAL | Status: DC

## 2014-12-05 MED ORDER — TRIAMCINOLONE ACETONIDE 0.025 % EX OINT: 1.0000 "application " | TOPICAL_OINTMENT | Freq: Two times a day (BID) | CUTANEOUS | Status: DC

## 2014-12-05 NOTE — Progress Notes (Signed)
   Subjective:    Patient ID: Patricia Villa, female    DOB: 29-Jun-1932, 81 y.o.   MRN: [id number]  Rash This is a new problem. The current episode started 1 to 4 weeks ago. The problem has been gradually worsening since onset. The affected locations include the chest, left arm and left upper leg. The rash is characterized by itchiness, redness and scaling. Pertinent negatives include no congestion, cough, diarrhea, fever, rhinorrhea, shortness of breath or vomiting. Past treatments include anti-itch cream. The treatment provided mild relief.      Review of Systems  Constitutional: Negative.  Negative for fever.  HENT: Negative.  Negative for congestion and rhinorrhea.   Eyes: Negative.   Respiratory: Negative.  Negative for cough and shortness of breath.   Cardiovascular: Negative.  Negative for palpitations.  Gastrointestinal: Negative.  Negative for vomiting and diarrhea.  Endocrine: Negative.   Genitourinary: Negative.   Musculoskeletal: Negative.   Skin: Positive for rash.  Neurological: Negative.  Negative for headaches.  Hematological: Negative.   Psychiatric/Behavioral: Negative.   All other systems reviewed and are negative.      Objective:   Physical Exam  Constitutional: She is oriented to person, place, and time. She appears well-developed and well-nourished. No distress.  HENT:  Head: Normocephalic and atraumatic.  Right Ear: External ear normal.  Mouth/Throat: Oropharynx is clear and moist.  Eyes: Pupils are equal, round, and reactive to light.  Neck: Normal range of motion. Neck supple. No thyromegaly present.  Cardiovascular: Normal rate, regular rhythm, normal heart sounds and intact distal pulses.   No murmur heard. Pulmonary/Chest: Effort normal and breath sounds normal. No respiratory distress. She has no wheezes.  Abdominal: Soft. Bowel sounds are normal. She exhibits no distension. There is no tenderness.  Musculoskeletal: Normal range of motion. She  exhibits no edema or tenderness.  Neurological: She is alert and oriented to person, place, and time. She has normal reflexes. No cranial nerve deficit.  Skin: Skin is warm and dry. Rash noted. There is erythema.  Generalized erythema rash on left arm, chest, right lower leg, and left lower leg   Psychiatric: She has a normal mood and affect. Her behavior is normal. Judgment and thought content normal.  Vitals reviewed.     BP 119/64 mmHg  Pulse 78  Temp(Src) 98.2 F (36.8 C) (Oral)  Ht 5\' 4"  (1.626 m)  Wt 129 lb (58.514 kg)  BMI 22.13 kg/m2  LMP 08/01/1982     Assessment & Plan:  1. Contact dermatitis -Do not scratch -Avoid allergens when possible -RTO prn or if symptoms do not improve - triamcinolone (KENALOG) 0.025 % ointment; Apply 1 application topically 2 (two) times daily.  Dispense: 30 g; Refill: 0 - methylPREDNISolone (MEDROL DOSEPAK) 4 MG TBPK tablet; Use as directed  Dispense: 21 tablet; Refill: 0  Evelina Dun, FNP

## 2014-12-05 NOTE — Patient Instructions (Signed)

## 2014-12-20 ENCOUNTER — Other Ambulatory Visit: Payer: Self-pay | Admitting: Family Medicine

## 2015-01-04 ENCOUNTER — Other Ambulatory Visit: Payer: Self-pay | Admitting: Family

## 2015-01-17 DIAGNOSIS — N390 Urinary tract infection, site not specified: Secondary | ICD-10-CM | POA: Diagnosis not present

## 2015-01-17 DIAGNOSIS — Z6823 Body mass index (BMI) 23.0-23.9, adult: Secondary | ICD-10-CM | POA: Diagnosis not present

## 2015-01-17 DIAGNOSIS — Z01419 Encounter for gynecological examination (general) (routine) without abnormal findings: Secondary | ICD-10-CM | POA: Diagnosis not present

## 2015-01-25 ENCOUNTER — Encounter: Payer: Self-pay | Admitting: Cardiology

## 2015-01-25 ENCOUNTER — Ambulatory Visit (INDEPENDENT_AMBULATORY_CARE_PROVIDER_SITE_OTHER): Payer: Medicare Other | Admitting: Cardiology

## 2015-01-25 ENCOUNTER — Other Ambulatory Visit: Payer: Medicare Other

## 2015-01-25 VITALS — BP 110/60 | HR 72 | Ht 64.0 in | Wt 132.0 lb

## 2015-01-25 DIAGNOSIS — I482 Chronic atrial fibrillation, unspecified: Secondary | ICD-10-CM

## 2015-01-25 DIAGNOSIS — Z79899 Other long term (current) drug therapy: Secondary | ICD-10-CM | POA: Diagnosis not present

## 2015-01-25 NOTE — Progress Notes (Signed)
HPI The patient presents for followup of MR and atrial fib. Patricia Villa Kitchen  Her last echocardiogram demonstrated mitral regurgitation which was moderate and was increased from previous.  She had severe left atrial enlargement. There was some elevated pulmonary pressures which was not severe. She had some erythema and swelling of her legs that had not completely respond to antibiotics.  She did see [facility] and was managed conservatively. Since I last saw her her leg is improved. She does have some spotty rash that is itchy and still some discoloration of that ankle that bothers her. She is tolerating anticoagulation. She did have good rate control on ultrasound with persistent atrial fibrillation. We have decided to manage her conservatively. She has a large left atrium secondary to mitral regurgitation and is unlikely to maintain sinus rhythm. She has some mild chronic dyspnea but this is not increased. She's not had any PND or orthopnea. She's not having any palpitations, presyncope or syncope. She has no chest pressure, neck or arm discomfort.  Allergies  Allergen Reactions  . Actonel [Risedronate Sodium] Other (See Comments)    Esophagitis   . Diphenhydramine Hcl Hives  . Fosamax [Alendronate Sodium] Other (See Comments)    Esophagitis   . Livalo [Pitavastatin Calcium] Other (See Comments)    Pt doesn't remember reaction  . Pravastatin Other (See Comments)    Extreme leg cramping.  . Sulfa Antibiotics Other (See Comments)    Pt doesn't remember reaction  . Zocor [Simvastatin] Other (See Comments)    Pt doesn't remember reaction    Current Outpatient Prescriptions  Medication Sig Dispense Refill  . amLODipine (NORVASC) 2.5 MG tablet Take 1 tablet (2.5 mg total) by mouth daily. 90 tablet 2  . apixaban (ELIQUIS) 5 MG TABS tablet Take 1 tablet (5 mg total) by mouth 2 (two) times daily. 60 tablet 11  . Cholecalciferol (VITAMIN D3) 2000 UNITS TABS Take 2,000 Units by mouth daily.    . DULoxetine  (CYMBALTA) 60 MG capsule TAKE ONE CAPSULE BY MOUTH ONE TIME DAILY 30 capsule 5  . estradiol (MINIVELLE) 0.05 MG/24HR patch Place 1 patch onto the skin 2 (two) times a week. Tuesday & Saturday.    . furosemide (LASIX) 20 MG tablet Take 1 tablet (20 mg total) by mouth daily. 30 tablet 6  . lisinopril (PRINIVIL,ZESTRIL) 40 MG tablet TAKE ONE TABLET BY MOUTH ONE TIME DAILY 30 tablet 5  . methylPREDNISolone (MEDROL DOSEPAK) 4 MG TBPK tablet Use as directed 21 tablet 0  . metoprolol succinate (TOPROL-XL) 25 MG 24 hr tablet Take 1/2 tablet by mouth daily    . polyethylene glycol (MIRALAX / GLYCOLAX) packet Take 17 g by mouth daily as needed (for constipation.).    Patricia Villa Kitchen triamcinolone (KENALOG) 0.025 % ointment APPLY 1 APPLICATION TOPICALLY 2 (TWO) TIMES DAILY. 30 g 0   No current facility-administered medications for this visit.    Past Medical History  Diagnosis Date  . Hypertrophic cardiomyopathy     by history but not identified on the most recent echo  . Mild mitral valve prolapse   . Mitral insufficiency     Mild  . Hypertension   . Meningioma   . Esotropia   . Hyperlipidemia   . Postmenopausal HRT (hormone replacement therapy)   . Arthritis   . Alopecia   . Diverticulosis of colon   . DDD (degenerative disc disease), cervical   . Anxiety   . Osteoporosis   . Cataract   . History of double vision  Past Surgical History  Procedure Laterality Date  . Abdominal hysterectomy    . Cataract extraction, bilateral    . Eye surgery    . Vein surgery    . Tonsillectomy      ROS:  As stated in the HPI and negative for all other systems.  PHYSICAL EXAM BP 110/60 mmHg  Pulse 72  Ht 5\' 4"  (1.626 m)  Wt 132 lb (59.875 kg)  BMI 22.65 kg/m2  LMP 08/01/1982 GENERAL:  Well appearing NECK:  No jugular venous distention, waveform within normal limits, right soft carotid bruits, no thyromegaly LUNGS:  Clear to auscultation bilaterally BACK:  No CVA tenderness CHEST:   Unremarkable HEART:  PMI not displaced or sustained,S1 and S2 within normal limits, no S3, no clicks, no rubs, apical holosystolic murmur 3/6, irregular ABD:  Flat, positive bowel sounds normal in frequency in pitch, no bruits, no rebound, no guarding, no midline pulsatile mass, no hepatomegaly, no splenomegaly EXT:  2 plus pulses throughout, left greater than right edema improved.     ASSESSMENT AND PLAN  ATRIAL FIB:  The patient tolerates this. She's tolerating anticoagulation. She has reasonable rate control. Given the size of the left atrium I would suspect that cardioversion would either be unsuccessful or not successful long-term. She will continue with rate control and anticoagulation.  Patricia Villa has a CHA2DS2 - VASc score of 4 with a risk of stroke of 4%.  She did report one dark stool and I will check a CBC.  MITRAL INSUFFICIENCY:  The patient does have mitral insufficiency that is progressed.  I will follow this up again in July. She will otherwise remain on the meds as listed.  HTN:  The blood pressure is at target. No change in medications is indicated. We will continue with therapeutic lifestyle changes (TLC).

## 2015-01-25 NOTE — Patient Instructions (Signed)
Medication Instructions:  The current medical regimen is effective;  continue present plan and medications.  Labwork: Please have CBC today at [facility].  Testing/Procedures: Your physician has requested that you have an echocardiogram in July 2017. Echocardiography is a painless test that uses sound waves to create images of your heart. It provides your doctor with information about the size and shape of your heart and how well your heart's chambers and valves are working. This procedure takes approximately one hour. There are no restrictions for this procedure.  Follow-Up: Follow up in July 2017 with Dr. Percival Spanish in [place] after your 2 D Echo.  You will receive a letter in the mail 2 months before you are due.  Please call us when you receive this letter to schedule your follow up appointment.

## 2015-01-26 LAB — CBC WITH DIFFERENTIAL/PLATELET
Basophils Absolute: 0.1 10*3/uL (ref 0.0–0.2)
Basos: 1 %
EOS (ABSOLUTE): 0.2 10*3/uL (ref 0.0–0.4)
EOS: 3 %
HEMATOCRIT: 37.2 % (ref 34.0–46.6)
Hemoglobin: 12.1 g/dL (ref 11.1–15.9)
Immature Grans (Abs): 0 10*3/uL (ref 0.0–0.1)
Immature Granulocytes: 0 %
Lymphocytes Absolute: 2 10*3/uL (ref 0.7–3.1)
Lymphs: 28 %
MCH: 30.3 pg (ref 26.6–33.0)
MCHC: 32.5 g/dL (ref 31.5–35.7)
MCV: 93 fL (ref 79–97)
MONOS ABS: 0.9 10*3/uL (ref 0.1–0.9)
Monocytes: 13 %
NEUTROS ABS: 3.8 10*3/uL (ref 1.4–7.0)
Neutrophils: 55 %
PLATELETS: 280 10*3/uL (ref 150–379)
RBC: 4 x10E6/uL (ref 3.77–5.28)
RDW: 15.2 % (ref 12.3–15.4)
WBC: 7 10*3/uL (ref 3.4–10.8)

## 2015-02-06 DIAGNOSIS — I872 Venous insufficiency (chronic) (peripheral): Secondary | ICD-10-CM | POA: Diagnosis not present

## 2015-02-06 DIAGNOSIS — L309 Dermatitis, unspecified: Secondary | ICD-10-CM | POA: Diagnosis not present

## 2015-02-06 DIAGNOSIS — L820 Inflamed seborrheic keratosis: Secondary | ICD-10-CM | POA: Diagnosis not present

## 2015-02-28 ENCOUNTER — Ambulatory Visit (INDEPENDENT_AMBULATORY_CARE_PROVIDER_SITE_OTHER): Payer: Medicare Other | Admitting: Family Medicine

## 2015-02-28 ENCOUNTER — Encounter: Payer: Self-pay | Admitting: Family Medicine

## 2015-02-28 VITALS — BP 106/61 | HR 74 | Temp 97.0°F | Ht 64.0 in | Wt 132.0 lb

## 2015-02-28 DIAGNOSIS — E785 Hyperlipidemia, unspecified: Secondary | ICD-10-CM | POA: Diagnosis not present

## 2015-02-28 DIAGNOSIS — E559 Vitamin D deficiency, unspecified: Secondary | ICD-10-CM

## 2015-02-28 DIAGNOSIS — I10 Essential (primary) hypertension: Secondary | ICD-10-CM

## 2015-02-28 DIAGNOSIS — M810 Age-related osteoporosis without current pathological fracture: Secondary | ICD-10-CM | POA: Diagnosis not present

## 2015-02-28 DIAGNOSIS — I421 Obstructive hypertrophic cardiomyopathy: Secondary | ICD-10-CM | POA: Diagnosis not present

## 2015-02-28 DIAGNOSIS — Z23 Encounter for immunization: Secondary | ICD-10-CM

## 2015-02-28 DIAGNOSIS — I480 Paroxysmal atrial fibrillation: Secondary | ICD-10-CM | POA: Diagnosis not present

## 2015-02-28 NOTE — Progress Notes (Signed)
Subjective:    Patient ID: Patricia Villa, female    DOB: Nov 19, 1932, 81 y.o.   MRN: [id number]  HPI Patient is here today for her 4 month follow up. Patient still has a rash all over that is bothering her. She Dr. [place] Crane approximately 3 weeks ago for her rash. The patient is due to return another FOBT. She will get lab work today and will be given a flu shot today. Patient denies chest pain shortness of breath trouble swallowing heartburn indigestion nausea vomiting diarrhea or blood in the stool. She is passing her water without problems. She does complain of the itching and rash which the dermatologist says is eczema. 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 She will continue to use over the counter gels and warm wet compresses to her neck and Tylenol if needed for pain. She just saw the cardiologist about a month ago and everything was stable and he will see her again in one year. She is due to get an eye exam and she was encouraged during the discussion to be sure and get this done.   Review of Systems  Constitutional: Negative.   HENT: Negative.   Eyes: Negative.   Respiratory: Negative.   Cardiovascular: Negative.   Gastrointestinal: Negative.   Endocrine: Negative.   Genitourinary: Negative.   Musculoskeletal: Positive for arthralgias.  Skin: Positive for rash.  Neurological: Negative.   Hematological: Negative.   Psychiatric/Behavioral: Negative.   All other systems reviewed and are negative.        Patient Active Problem List   Diagnosis Date Noted  . Atrial fibrillation ([place]) 11/23/2014  . Osteoporosis 08/31/2014  . Chronic neck pain 06/07/2014  . DDD (degenerative disc disease), cervical 01/24/2014  . Hyperlipidemia 05/17/2013  . Colitis 01/20/2013  . Generalized anxiety disorder 08/05/2012  . Meningioma ([place]) 08/05/2012  . Bruit 10/17/2010  . Essential  hypertension, benign 10/04/2009  . MITRAL INSUFFICIENCY 09/26/2009  . Mitral valve disorder 09/26/2009  . Hypertrophic obstructive cardiomyopathy ([place]) 09/26/2009   Outpatient Encounter Prescriptions as of 02/28/2015  Medication Sig  . amLODipine (NORVASC) 2.5 MG tablet Take 1 tablet (2.5 mg total) by mouth daily.  Marland Kitchen apixaban (ELIQUIS) 5 MG TABS tablet Take 1 tablet (5 mg total) by mouth 2 (two) times daily.  . Cholecalciferol (VITAMIN D3) 2000 UNITS TABS Take 2,000 Units by mouth daily.  . clobetasol cream (TEMOVATE) 0.05 %   . furosemide (LASIX) 20 MG tablet Take 1 tablet (20 mg total) by mouth daily.  Marland Kitchen lisinopril (PRINIVIL,ZESTRIL) 40 MG tablet TAKE ONE TABLET BY MOUTH ONE TIME DAILY  . metoprolol succinate (TOPROL-XL) 25 MG 24 hr tablet Take 1/2 tablet by mouth daily  . polyethylene glycol (MIRALAX / GLYCOLAX) packet Take 17 g by mouth daily as needed (for constipation.).  Marland Kitchen DULoxetine (CYMBALTA) 60 MG capsule TAKE ONE CAPSULE BY MOUTH ONE TIME DAILY  . [DISCONTINUED] estradiol (MINIVELLE) 0.05 MG/24HR patch Place 1 patch onto the skin 2 (two) times a week. Tuesday & Saturday.  . [DISCONTINUED] methylPREDNISolone (MEDROL DOSEPAK) 4 MG TBPK tablet Use as directed  . [DISCONTINUED] triamcinolone (KENALOG) 0.025 % ointment APPLY 1 APPLICATION TOPICALLY 2 (TWO) TIMES DAILY. (Patient not taking: Reported on 02/28/2015)   No facility-administered encounter medications on file as of 02/28/2015.       Objective:   Physical Exam  Constitutional: She is oriented to person,  place, and time. She appears well-developed and well-nourished. No distress.  Thin  but looks much younger than her stated age. Pleasant and alert  HENT:  Head: Normocephalic and atraumatic.  Right Ear: External ear normal.  Left Ear: External ear normal.  Nose: Nose normal.  Mouth/Throat: Oropharynx is clear and moist.  Eyes: Conjunctivae and EOM are normal. Pupils are equal, round, and reactive to light. Right eye  exhibits no discharge. Left eye exhibits no discharge. No scleral icterus.  Neck: Normal range of motion. Neck supple. No thyromegaly present.  Without bruits thyroid enlargement  Cardiovascular: Normal rate, regular rhythm, normal heart sounds and intact distal pulses.   No murmur heard. The rhythm is regular at 72/m  Pulmonary/Chest: Effort normal and breath sounds normal. No respiratory distress. She has no wheezes. She has no rales. She exhibits no tenderness.  Clear anteriorly and posteriorly  Abdominal: Soft. Bowel sounds are normal. She exhibits no mass. There is no tenderness. There is no rebound and no guarding.  No abdominal tenderness bruits or organ enlargement or masses  Musculoskeletal: Normal range of motion. She exhibits no edema or tenderness.  Somewhat kyphotic posture  Lymphadenopathy:    She has no cervical adenopathy.  Neurological: She is alert and oriented to person, place, and time. She has normal reflexes. No cranial nerve deficit.  Skin: Skin is warm and dry. No rash noted.  Psychiatric: She has a normal mood and affect. Her behavior is normal. Judgment and thought content normal.  Nursing note and vitals reviewed.  BP 106/61 mmHg  Pulse 74  Temp(Src) 97 F (36.1 C) (Oral)  Ht 5' 4"  (1.626 m)  Wt 132 lb (59.875 kg)  BMI 22.65 kg/m2  LMP 08/01/1982        Assessment & Plan:  1. Essential hypertension, benign -The blood pressure is good today the patient will continue with her current treatment - BMP8+EGFR - CBC with Differential/Platelet  2. Vitamin D deficiency -She should continue with her current vitamin D dose pending results of lab work - Vit D  25 hydroxy (rtn osteoporosis monitoring)  3. Hyperlipidemia -Continue with aggressive therapeutic lifestyle changes pending results of lab work - Hepatic function panel - NMR, lipoprofile  4. Paroxysmal atrial fibrillation (HCC) -The rhythm is regular today and she will continue with her current  treatment and follow-up with cardiology  5. Hypertrophic obstructive cardiomyopathy ([place]) -She is having no problems with shortness of breath or chest pain she will continue with her current cardiac medications and follow-up with cardiologist  6. Osteoporosis -She is up-to-date on her DEXA scans and we'll continue to be careful to not put herself at risk for falling -She needs to get her eyes examined  Meds ordered this encounter  Medications  . clobetasol cream (TEMOVATE) 0.05 %    Sig:    Patient Instructions  Continue all meds Continue Lifestyle modification- diet and exercise Fall precautions discussed Keep follow up appointments with any specialists  Please get your eye exam done Continue to use salonpas Also use warm wet compresses to the neck 20 minutes 3 or 4 times daily Drink plenty of fluids Use nasal saline and Mucinex if needed B careful and do not put yourself at risk for falling   Arrie Senate MD

## 2015-02-28 NOTE — Patient Instructions (Signed)
Continue all meds Continue Lifestyle modification- diet and exercise Fall precautions discussed Keep follow up appointments with any specialists  Please get your eye exam done Continue to use salonpas Also use warm wet compresses to the neck 20 minutes 3 or 4 times daily Drink plenty of fluids Use nasal saline and Mucinex if needed B careful and do not put yourself at risk for falling

## 2015-03-01 LAB — CBC WITH DIFFERENTIAL/PLATELET
BASOS: 1 %
Basophils Absolute: 0 10*3/uL (ref 0.0–0.2)
EOS (ABSOLUTE): 0.2 10*3/uL (ref 0.0–0.4)
Eos: 3 %
HEMATOCRIT: 38 % (ref 34.0–46.6)
Hemoglobin: 12.2 g/dL (ref 11.1–15.9)
IMMATURE GRANS (ABS): 0 10*3/uL (ref 0.0–0.1)
Immature Granulocytes: 0 %
LYMPHS: 30 %
Lymphocytes Absolute: 1.7 10*3/uL (ref 0.7–3.1)
MCH: 30.8 pg (ref 26.6–33.0)
MCHC: 32.1 g/dL (ref 31.5–35.7)
MCV: 96 fL (ref 79–97)
MONOS ABS: 0.6 10*3/uL (ref 0.1–0.9)
Monocytes: 11 %
NEUTROS ABS: 3.2 10*3/uL (ref 1.4–7.0)
Neutrophils: 55 %
PLATELETS: 273 10*3/uL (ref 150–379)
RBC: 3.96 x10E6/uL (ref 3.77–5.28)
RDW: 14.5 % (ref 12.3–15.4)
WBC: 5.8 10*3/uL (ref 3.4–10.8)

## 2015-03-01 LAB — NMR, LIPOPROFILE
Cholesterol: 170 mg/dL (ref 100–199)
HDL Cholesterol by NMR: 54 mg/dL (ref >39)
HDL PARTICLE NUMBER: 28.7 umol/L — AB (ref >=30.5)
LDL Particle Number: 1079 nmol/L — ABNORMAL HIGH (ref <1000)
LDL SIZE: 20.8 nm (ref >20.5)
LDL-C: 97 mg/dL (ref 0–99)
SMALL LDL PARTICLE NUMBER: 260 nmol/L (ref <=527)
Triglycerides by NMR: 96 mg/dL (ref 0–149)

## 2015-03-01 LAB — HEPATIC FUNCTION PANEL
ALK PHOS: 61 IU/L (ref 39–117)
ALT: 17 IU/L (ref 0–32)
AST: 23 IU/L (ref 0–40)
Albumin: 4.4 g/dL (ref 3.5–4.7)
BILIRUBIN TOTAL: 0.8 mg/dL (ref 0.0–1.2)
BILIRUBIN, DIRECT: 0.23 mg/dL (ref 0.00–0.40)
Total Protein: 7.1 g/dL (ref 6.0–8.5)

## 2015-03-01 LAB — BMP8+EGFR
BUN/Creatinine Ratio: 30 — ABNORMAL HIGH (ref 11–26)
BUN: 21 mg/dL (ref 8–27)
CALCIUM: 9.7 mg/dL (ref 8.7–10.3)
CO2: 27 mmol/L (ref 18–29)
CREATININE: 0.69 mg/dL (ref 0.57–1.00)
Chloride: 100 mmol/L (ref 97–106)
GFR, EST AFRICAN AMERICAN: 95 mL/min/{1.73_m2} (ref >59)
GFR, EST NON AFRICAN AMERICAN: 82 mL/min/{1.73_m2} (ref >59)
Glucose: 93 mg/dL (ref 65–99)
POTASSIUM: 4.2 mmol/L (ref 3.5–5.2)
Sodium: 143 mmol/L (ref 136–144)

## 2015-03-01 LAB — VITAMIN D 25 HYDROXY (VIT D DEFICIENCY, FRACTURES): VIT D 25 HYDROXY: 41.8 ng/mL (ref 30.0–100.0)

## 2015-03-10 ENCOUNTER — Other Ambulatory Visit: Payer: Self-pay | Admitting: Family Medicine

## 2015-03-14 ENCOUNTER — Telehealth: Payer: Self-pay | Admitting: Family Medicine

## 2015-03-15 NOTE — Telephone Encounter (Signed)
Patient aware that I do not see where anyone has tried to call her.

## 2015-03-20 DIAGNOSIS — L259 Unspecified contact dermatitis, unspecified cause: Secondary | ICD-10-CM | POA: Diagnosis not present

## 2015-03-20 DIAGNOSIS — I872 Venous insufficiency (chronic) (peripheral): Secondary | ICD-10-CM | POA: Diagnosis not present

## 2015-04-18 ENCOUNTER — Other Ambulatory Visit: Payer: Self-pay | Admitting: Family Medicine

## 2015-05-04 DIAGNOSIS — I872 Venous insufficiency (chronic) (peripheral): Secondary | ICD-10-CM | POA: Diagnosis not present

## 2015-05-12 DIAGNOSIS — H5032 Intermittent alternating esotropia: Secondary | ICD-10-CM | POA: Diagnosis not present

## 2015-05-25 ENCOUNTER — Other Ambulatory Visit: Payer: Self-pay | Admitting: *Deleted

## 2015-05-25 MED ORDER — METOPROLOL SUCCINATE ER 25 MG PO TB24: 12.5000 mg | ORAL_TABLET | Freq: Every day | ORAL | Status: DC

## 2015-06-13 ENCOUNTER — Other Ambulatory Visit: Payer: Self-pay

## 2015-06-13 MED ORDER — DULOXETINE HCL 60 MG PO CPEP: 60.0000 mg | ORAL_CAPSULE | Freq: Every day | ORAL | Status: DC

## 2015-06-13 MED ORDER — LISINOPRIL 40 MG PO TABS: 40.0000 mg | ORAL_TABLET | Freq: Every day | ORAL | Status: DC

## 2015-06-13 MED ORDER — FUROSEMIDE 20 MG PO TABS: 20.0000 mg | ORAL_TABLET | Freq: Every day | ORAL | Status: DC

## 2015-06-14 ENCOUNTER — Ambulatory Visit (INDEPENDENT_AMBULATORY_CARE_PROVIDER_SITE_OTHER): Payer: Medicare Other | Admitting: Pediatrics

## 2015-06-14 ENCOUNTER — Encounter: Payer: Self-pay | Admitting: Pediatrics

## 2015-06-14 VITALS — BP 136/75 | HR 77 | Temp 97.5°F | Ht 64.0 in | Wt 133.6 lb

## 2015-06-14 DIAGNOSIS — J069 Acute upper respiratory infection, unspecified: Secondary | ICD-10-CM | POA: Diagnosis not present

## 2015-06-14 DIAGNOSIS — R300 Dysuria: Secondary | ICD-10-CM | POA: Diagnosis not present

## 2015-06-14 DIAGNOSIS — R8281 Pyuria: Secondary | ICD-10-CM

## 2015-06-14 DIAGNOSIS — N390 Urinary tract infection, site not specified: Secondary | ICD-10-CM

## 2015-06-14 DIAGNOSIS — N3000 Acute cystitis without hematuria: Secondary | ICD-10-CM

## 2015-06-14 DIAGNOSIS — L97921 Non-pressure chronic ulcer of unspecified part of left lower leg limited to breakdown of skin: Secondary | ICD-10-CM | POA: Diagnosis not present

## 2015-06-14 DIAGNOSIS — I10 Essential (primary) hypertension: Secondary | ICD-10-CM | POA: Diagnosis not present

## 2015-06-14 LAB — POCT URINALYSIS DIPSTICK
Bilirubin, UA: NEGATIVE
GLUCOSE UA: NEGATIVE
Ketones, UA: NEGATIVE
NITRITE UA: POSITIVE
Spec Grav, UA: 1.025
UROBILINOGEN UA: NEGATIVE
pH, UA: 6

## 2015-06-14 LAB — POCT UA - MICROSCOPIC ONLY
Casts, Ur, LPF, POC: NEGATIVE
Crystals, Ur, HPF, POC: NEGATIVE
Mucus, UA: NEGATIVE
Yeast, UA: NEGATIVE

## 2015-06-14 MED ORDER — CIPROFLOXACIN HCL 250 MG PO TABS: 250.0000 mg | ORAL_TABLET | Freq: Two times a day (BID) | ORAL | Status: DC

## 2015-06-14 NOTE — Patient Instructions (Signed)
Call me Friday if not feeling better

## 2015-06-14 NOTE — Progress Notes (Signed)
Subjective:    Patient ID: Patricia Villa, female    DOB: Nov 11, 1932, 82 y.o.   MRN: [id number]  CC: Sore Throat; Fatigue; and Nasal Congestion   HPI: Patricia Villa is a 82 y.o. female presenting for Sore Throat; Fatigue; and Nasal Congestion  Started getting sick several days ago, not sure exactly when Coughing some Scratchy throat No temperature today Sweating at times Appetite has been ok Drinking lots of fluids Using chloraseptic spray Dysuria over the past few days, some frequency   Depression screen [facility] 2/9 02/28/2015 12/05/2014 11/09/2014 10/27/2014 10/18/2014  Decreased Interest 0 0 0 0 0  Down, Depressed, Hopeless 0 0 0 0 0  PHQ - 2 Score 0 0 0 0 0     Relevant past medical, surgical, family and social history reviewed and updated as indicated. Interim medical history since our last visit reviewed. Allergies and medications reviewed and updated.    ROS: Per HPI unless specifically indicated above  History  Smoking status  . Never Smoker   Smokeless tobacco  . Never Used    Comment: Rare previously    Past Medical History Patient Active Problem List   Diagnosis Date Noted  . Atrial fibrillation ([place]) 11/23/2014  . Osteoporosis 08/31/2014  . Chronic neck pain 06/07/2014  . DDD (degenerative disc disease), cervical 01/24/2014  . Hyperlipidemia 05/17/2013  . Colitis 01/20/2013  . Generalized anxiety disorder 08/05/2012  . Meningioma ([place]) 08/05/2012  . Bruit 10/17/2010  . Essential hypertension, benign 10/04/2009  . MITRAL INSUFFICIENCY 09/26/2009  . Mitral valve disorder 09/26/2009  . Hypertrophic obstructive cardiomyopathy ([place]) 09/26/2009    Current Outpatient Prescriptions  Medication Sig Dispense Refill  . amLODipine (NORVASC) 2.5 MG tablet TAKE ONE TABLET BY MOUTH ONE TIME DAILY 90 tablet 1  . apixaban (ELIQUIS) 5 MG TABS tablet Take 1 tablet (5 mg total) by mouth 2 (two) times daily. 60 tablet 11  . Cholecalciferol (VITAMIN D3) 2000  UNITS TABS Take 2,000 Units by mouth daily.    . clobetasol cream (TEMOVATE) 0.05 %     . DULoxetine (CYMBALTA) 60 MG capsule Take 1 capsule (60 mg total) by mouth daily. 30 capsule 0  . furosemide (LASIX) 20 MG tablet Take 1 tablet (20 mg total) by mouth daily. 30 tablet 2  . lisinopril (PRINIVIL,ZESTRIL) 40 MG tablet Take 1 tablet (40 mg total) by mouth daily. 30 tablet 2  . metoprolol succinate (TOPROL-XL) 25 MG 24 hr tablet Take 0.5 tablets (12.5 mg total) by mouth daily. Take 1/2 tablet by mouth daily 30 tablet 2  . polyethylene glycol (MIRALAX / GLYCOLAX) packet Take 17 g by mouth daily as needed (for constipation.).     No current facility-administered medications for this visit.       Objective:    BP 136/75 mmHg  Pulse 77  Temp(Src) 97.5 F (36.4 C) (Oral)  Ht 5\' 4"  (1.626 m)  Wt 133 lb 9.6 oz (60.601 kg)  BMI 22.92 kg/m2  LMP 08/01/1982  Wt Readings from Last 3 Encounters:  06/14/15 133 lb 9.6 oz (60.601 kg)  02/28/15 132 lb (59.875 kg)  01/25/15 132 lb (59.875 kg)     Gen: NAD, alert, cooperative with exam, NCAT EYES: EOMI, no scleral injection or icterus ENT:  TMs pearly gray b/l, OP with some erythema, no tonsils LYMPH: no cervical LAD CV: NRRR, normal S1/S2, no murmur, distal pulses 2+ b/l Resp: CTABL, no wheezes, normal WOB Abd: +BS, soft, NTND. Ext: No edema, warm  Neuro: Alert and oriented, strength equal b/l UE and LE, coordination grossly normal MSK: normal muscle bulk Skin: L lateral leg with shallow yellow scab covered ulcer, no surrounding erythema. No TTP.      Assessment & Plan:    Daisi was seen today for sore throat, fatigue and nasal congestion, likely with acute URI. Also with some dysuria.  Diagnoses and all orders for this visit:  Dysuria -     POCT UA - Microscopic Only -     POCT urinalysis dipstick -  Urine culture  Essential hypertension, benign Well controlled, continue current medicine  Acute URI Discussed symptomatic care,  let me know if worsening   Leg ulcer, left, limited to breakdown of skin (HCC) Healing, wear compression hose if swelling in leg, non recently   Follow up plan: As needed  [name], MD [facility] 06/14/2015, 4:00 PM

## 2015-06-16 LAB — URINE CULTURE

## 2015-06-16 MED ORDER — AMOXICILLIN 500 MG PO CAPS: 500.0000 mg | ORAL_CAPSULE | Freq: Two times a day (BID) | ORAL | Status: DC

## 2015-06-16 NOTE — Addendum Note (Signed)
Addended by: Eustaquio Maize on: 06/16/2015 05:22 PM   Modules accepted: Orders, SmartSet

## 2015-06-16 NOTE — Addendum Note (Signed)
Addended by: Eustaquio Maize on: 06/16/2015 05:46 PM   Modules accepted: Orders, Medications, SmartSet

## 2015-07-05 DIAGNOSIS — Z961 Presence of intraocular lens: Secondary | ICD-10-CM | POA: Diagnosis not present

## 2015-07-05 DIAGNOSIS — H5052 Exophoria: Secondary | ICD-10-CM | POA: Diagnosis not present

## 2015-07-05 DIAGNOSIS — H524 Presbyopia: Secondary | ICD-10-CM | POA: Diagnosis not present

## 2015-07-07 ENCOUNTER — Other Ambulatory Visit: Payer: Self-pay | Admitting: Pharmacist

## 2015-07-07 MED ORDER — CLOBETASOL PROPIONATE 0.05 % EX CREA: TOPICAL_CREAM | Freq: Two times a day (BID) | CUTANEOUS | Status: DC

## 2015-07-10 ENCOUNTER — Other Ambulatory Visit: Payer: Self-pay | Admitting: Family Medicine

## 2015-07-10 DIAGNOSIS — I059 Rheumatic mitral valve disease, unspecified: Secondary | ICD-10-CM

## 2015-07-10 DIAGNOSIS — I4891 Unspecified atrial fibrillation: Secondary | ICD-10-CM

## 2015-07-10 DIAGNOSIS — I080 Rheumatic disorders of both mitral and aortic valves: Secondary | ICD-10-CM

## 2015-07-10 DIAGNOSIS — I10 Essential (primary) hypertension: Secondary | ICD-10-CM

## 2015-07-10 MED ORDER — APIXABAN 5 MG PO TABS: 5.0000 mg | ORAL_TABLET | Freq: Two times a day (BID) | ORAL | Status: DC

## 2015-07-10 NOTE — Telephone Encounter (Signed)
done

## 2015-07-17 ENCOUNTER — Telehealth: Payer: Self-pay | Admitting: Pharmacist

## 2015-07-17 NOTE — Telephone Encounter (Signed)
Question answered. 

## 2015-07-18 ENCOUNTER — Ambulatory Visit (INDEPENDENT_AMBULATORY_CARE_PROVIDER_SITE_OTHER): Payer: Medicare Other | Admitting: Family Medicine

## 2015-07-18 ENCOUNTER — Encounter: Payer: Self-pay | Admitting: Family Medicine

## 2015-07-18 VITALS — BP 105/61 | HR 70 | Temp 97.6°F | Ht 64.0 in | Wt 134.0 lb

## 2015-07-18 DIAGNOSIS — M5030 Other cervical disc degeneration, unspecified cervical region: Secondary | ICD-10-CM | POA: Diagnosis not present

## 2015-07-18 DIAGNOSIS — I421 Obstructive hypertrophic cardiomyopathy: Secondary | ICD-10-CM | POA: Diagnosis not present

## 2015-07-18 DIAGNOSIS — Z1211 Encounter for screening for malignant neoplasm of colon: Secondary | ICD-10-CM

## 2015-07-18 DIAGNOSIS — E785 Hyperlipidemia, unspecified: Secondary | ICD-10-CM | POA: Diagnosis not present

## 2015-07-18 DIAGNOSIS — E559 Vitamin D deficiency, unspecified: Secondary | ICD-10-CM

## 2015-07-18 DIAGNOSIS — D329 Benign neoplasm of meninges, unspecified: Secondary | ICD-10-CM

## 2015-07-18 DIAGNOSIS — R6889 Other general symptoms and signs: Secondary | ICD-10-CM

## 2015-07-18 DIAGNOSIS — I480 Paroxysmal atrial fibrillation: Secondary | ICD-10-CM

## 2015-07-18 NOTE — Patient Instructions (Addendum)
Medicare Annual Wellness Visit  [facility] and the medical providers at [facility] strive to bring you the best medical care.  In doing so we not only want to address your current medical conditions and concerns but also to detect new conditions early and prevent illness, disease and health-related problems.    Medicare offers a yearly Wellness Visit which allows our clinical staff to assess your need for preventative services including immunizations, lifestyle education, counseling to decrease risk of preventable diseases and screening for fall risk and other medical concerns.    This visit is provided free of charge (no copay) for all Medicare recipients. The clinical pharmacists at [facility] have begun to conduct these Wellness Visits which will also include a thorough review of all your medications.    As you primary medical provider recommend that you make an appointment for your Annual Wellness Visit if you have not done so already this year.  You may set up this appointment before you leave today or you may call back [phone] and schedule an appointment.  Please make sure when you call that you mention that you are scheduling your Annual Wellness Visit with the clinical pharmacist so that the appointment may be made for the proper length of time.     Continue current medications. Continue good therapeutic lifestyle changes which include good diet and exercise. Fall precautions discussed with patient. If an FOBT was given today- please return it to our front desk. If you are over 63 years old - you may need Prevnar 70 or the adult Pneumonia vaccine.  **Flu shots are available--- please call and schedule a FLU-CLINIC appointment**  After your visit with [place] today you will receive a survey in the mail or online from [employer] regarding your care with [place]. Please take a moment to fill this out. Your feedback is very  important to [place] as you can help [place] better understand your patient needs as well as improve your experience and satisfaction. WE CARE ABOUT YOU!!!   The patient should use nasal saline nasal saline gel and a cool mist humidifier She should drink plenty of fluids and keep the house as cool as possible If she continues to have head congestion after a couple weeks please give [place] a call The extra humidification should help her hoarseness and the drainage that she is having When she finds out who the neurosurgeon is that she is to be seeing she should make sure that he is aware that we need a copy of any results are reports that he sends out to other providers Continue to follow-up with cardiology

## 2015-07-18 NOTE — Progress Notes (Signed)
Subjective:    Patient ID: Patricia Villa, female    DOB: [birth year], 82 y.o.   MRN: [id number]  HPI Pt here for follow up and management of chronic medical problems which includes hyperlipidemia. She is taking medications regularly.The patient's biggest complaint today is hoarseness. She is due to return an FOBT and get lab work. She is also due to get a mammogram and we will make sure that she gets schedule for this. The patient sees the cardiologist yearly. She also is followed by her gynecologist yearly. She is in need of getting a mammogram and a pelvic exam. She also sees a neurosurgeon about her meningioma. She is going to change the neurosurgeons and she does not know the name of the neurosurgeon that she will be seeing yet. She has occasional palpitations when she is physically stressed. She denies any chest pain or shortness of breath. She is swallowing okay most the time but does have some occasional problems with swallowing liquids. This is not getting any worse than usual. She denies any heartburn indigestion nausea vomiting diarrhea or blood in the stool. She is passing her water without problems and no blood is noted.      Patient Active Problem List   Diagnosis Date Noted  . Atrial fibrillation ([place]) 11/23/2014  . Osteoporosis 08/31/2014  . Chronic neck pain 06/07/2014  . DDD (degenerative disc disease), cervical 01/24/2014  . Hyperlipidemia 05/17/2013  . Colitis 01/20/2013  . Generalized anxiety disorder 08/05/2012  . Meningioma ([place]) 08/05/2012  . Bruit 10/17/2010  . Essential hypertension, benign 10/04/2009  . MITRAL INSUFFICIENCY 09/26/2009  . Mitral valve disorder 09/26/2009  . Hypertrophic obstructive cardiomyopathy ([place]) 09/26/2009   Outpatient Encounter Prescriptions as of 07/18/2015  Medication Sig  . amLODipine (NORVASC) 2.5 MG tablet TAKE ONE TABLET BY MOUTH ONE TIME DAILY  . apixaban (ELIQUIS) 5 MG TABS tablet Take 1 tablet (5 mg total) by mouth 2 (two)  times daily.  . Cholecalciferol (VITAMIN D3) 2000 UNITS TABS Take 2,000 Units by mouth daily.  . clobetasol cream (TEMOVATE) 0.05 % Apply topically 2 (two) times daily.  . DULoxetine (CYMBALTA) 60 MG capsule Take 1 capsule (60 mg total) by mouth daily.  . furosemide (LASIX) 20 MG tablet Take 1 tablet (20 mg total) by mouth daily.  Marland Kitchen lisinopril (PRINIVIL,ZESTRIL) 40 MG tablet Take 1 tablet (40 mg total) by mouth daily.  . metoprolol succinate (TOPROL-XL) 25 MG 24 hr tablet Take 0.5 tablets (12.5 mg total) by mouth daily. Take 1/2 tablet by mouth daily  . polyethylene glycol (MIRALAX / GLYCOLAX) packet Take 17 g by mouth daily as needed (for constipation.).  . [DISCONTINUED] ciprofloxacin (CIPRO) 250 MG tablet Take 1 tablet (250 mg total) by mouth 2 (two) times daily.   No facility-administered encounter medications on file as of 07/18/2015.      Review of Systems  Constitutional: Negative.   HENT: Positive for voice change (hoarsness).   Eyes: Negative.   Respiratory: Negative.   Cardiovascular: Negative.   Gastrointestinal: Negative.   Endocrine: Negative.   Genitourinary: Negative.   Musculoskeletal: Negative.   Skin: Negative.   Allergic/Immunologic: Negative.   Neurological: Negative.   Hematological: Negative.   Psychiatric/Behavioral: Negative.        Objective:   Physical Exam  Constitutional: She is oriented to person, place, and time. She appears well-developed and well-nourished. No distress.  HENT:  Head: Normocephalic and atraumatic.  Right Ear: External ear normal.  Left Ear: External ear normal.  Mouth/Throat: Oropharynx is clear and moist. No oropharyngeal exudate.  Some nasal congestion bilaterally.  Eyes: Conjunctivae and EOM are normal. Pupils are equal, round, and reactive to light. Right eye exhibits no discharge. Left eye exhibits no discharge. No scleral icterus.  Neck: Normal range of motion. Neck supple. No thyromegaly present.  No nodes and no  thyromegaly  Cardiovascular: Normal rate, normal heart sounds and intact distal pulses.   No murmur heard. The heart is irregular irregular at 72/m  Pulmonary/Chest: Effort normal and breath sounds normal. No respiratory distress. She has no wheezes. She has no rales. She exhibits no tenderness.  Clear anteriorly and posteriorly  Abdominal: Soft. Bowel sounds are normal. She exhibits no mass. There is no tenderness. There is no rebound and no guarding.  No abdominal tenderness spleen or liver enlargement or bruits  Musculoskeletal: Normal range of motion. She exhibits no edema.  Lymphadenopathy:    She has no cervical adenopathy.  Neurological: She is alert and oriented to person, place, and time. She has normal reflexes. No cranial nerve deficit.  Skin: Skin is warm and dry. No rash noted.  She has a chronic area of inflammation above the left lateral malleolus which currently looks good and is not red or inflamed or draining.  Psychiatric: She has a normal mood and affect. Her behavior is normal. Judgment and thought content normal.  Nursing note and vitals reviewed.  BP 105/61 mmHg  Pulse 70  Temp(Src) 97.6 F (36.4 C) (Oral)  Ht 5' 4"  (1.626 m)  Wt 134 lb (60.782 kg)  BMI 22.99 kg/m2  LMP 08/01/1982        Assessment & Plan:  1. Hyperlipidemia -Continue current treatment pending results of lab work--she is statin intolerant so she basically is following aggressive therapeutic lifestyle changes - BMP8+EGFR - CBC with Differential/Platelet - Hepatic function panel - NMR, lipoprofile  2. Vitamin D deficiency -Continue vitamin D replacement pending results of lab work - CBC with Differential/Platelet - VITAMIN D 25 Hydroxy (Vit-D Deficiency, Fractures)  3. Paroxysmal atrial fibrillation (HCC) -Continue to follow-up with cardiology and continue with apixaban - CBC with Differential/Platelet  4. Special screening for malignant neoplasms, colon - Fecal occult blood,  imunochemical; Future - CBC with Differential/Platelet  5. DDD (degenerative disc disease), cervical -Tylenol if needed for aches pains and fever  6. Hypertrophic obstructive cardiomyopathy (HCC) -Continue to follow-up with cardiology  7. Meningioma ([place]) -Intended to follow-up with neurosurgery  8. Throat congestion -Use Mucinex nasal saline and nasal saline gel and drink plenty of fluids and stay well hydrated and use cool mist humidification  . Patient Instructions                       Medicare Annual Wellness Visit  [facility] and the medical providers at [facility] strive to bring you the best medical care.  In doing so we not only want to address your current medical conditions and concerns but also to detect new conditions early and prevent illness, disease and health-related problems.    Medicare offers a yearly Wellness Visit which allows our clinical staff to assess your need for preventative services including immunizations, lifestyle education, counseling to decrease risk of preventable diseases and screening for fall risk and other medical concerns.    This visit is provided free of charge (no copay) for all Medicare recipients. The clinical pharmacists at [facility] have begun to conduct these Wellness Visits which will  also include a thorough review of all your medications.    As you primary medical provider recommend that you make an appointment for your Annual Wellness Visit if you have not done so already this year.  You may set up this appointment before you leave today or you may call back [phone]) and schedule an appointment.  Please make sure when you call that you mention that you are scheduling your Annual Wellness Visit with the clinical pharmacist so that the appointment may be made for the proper length of time.     Continue current medications. Continue good therapeutic lifestyle changes which include good  diet and exercise. Fall precautions discussed with patient. If an FOBT was given today- please return it to our front desk. If you are over 10 years old - you may need Prevnar 58 or the adult Pneumonia vaccine.  **Flu shots are available--- please call and schedule a FLU-CLINIC appointment**  After your visit with [place] today you will receive a survey in the mail or online from [employer] regarding your care with [place]. Please take a moment to fill this out. Your feedback is very important to [place] as you can help [place] better understand your patient needs as well as improve your experience and satisfaction. WE CARE ABOUT YOU!!!   The patient should use nasal saline nasal saline gel and a cool mist humidifier She should drink plenty of fluids and keep the house as cool as possible If she continues to have head congestion after a couple weeks please give [place] a call The extra humidification should help her hoarseness and the drainage that she is having When she finds out who the neurosurgeon is that she is to be seeing she should make sure that he is aware that we need a copy of any results are reports that he sends out to other providers Continue to follow-up with cardiology   Arrie Senate MD

## 2015-07-19 LAB — CBC WITH DIFFERENTIAL/PLATELET
Basophils Absolute: 0.1 10*3/uL (ref 0.0–0.2)
Basos: 1 %
EOS (ABSOLUTE): 0.2 10*3/uL (ref 0.0–0.4)
Eos: 3 %
HEMATOCRIT: 37.5 % (ref 34.0–46.6)
Hemoglobin: 12.4 g/dL (ref 11.1–15.9)
Immature Grans (Abs): 0 10*3/uL (ref 0.0–0.1)
Immature Granulocytes: 0 %
LYMPHS ABS: 2.3 10*3/uL (ref 0.7–3.1)
LYMPHS: 29 %
MCH: 31.5 pg (ref 26.6–33.0)
MCHC: 33.1 g/dL (ref 31.5–35.7)
MCV: 95 fL (ref 79–97)
MONOCYTES: 11 %
MONOS ABS: 0.9 10*3/uL (ref 0.1–0.9)
NEUTROS PCT: 56 %
Neutrophils Absolute: 4.6 10*3/uL (ref 1.4–7.0)
PLATELETS: 239 10*3/uL (ref 150–379)
RBC: 3.94 x10E6/uL (ref 3.77–5.28)
RDW: 14.3 % (ref 12.3–15.4)
WBC: 8 10*3/uL (ref 3.4–10.8)

## 2015-07-19 LAB — HEPATIC FUNCTION PANEL
ALBUMIN: 4.3 g/dL (ref 3.5–4.7)
ALT: 20 IU/L (ref 0–32)
AST: 21 IU/L (ref 0–40)
Alkaline Phosphatase: 68 IU/L (ref 39–117)
Bilirubin Total: 0.7 mg/dL (ref 0.0–1.2)
Bilirubin, Direct: 0.19 mg/dL (ref 0.00–0.40)
Total Protein: 7.7 g/dL (ref 6.0–8.5)

## 2015-07-19 LAB — NMR, LIPOPROFILE
Cholesterol: 165 mg/dL (ref 100–199)
HDL CHOLESTEROL BY NMR: 55 mg/dL (ref >39)
HDL Particle Number: 27.7 umol/L — ABNORMAL LOW (ref >=30.5)
LDL Particle Number: 946 nmol/L (ref <1000)
LDL Size: 20.9 nm (ref >20.5)
LDL-C: 98 mg/dL (ref 0–99)
LP-IR Score: <25 (ref <=45)
Small LDL Particle Number: 363 nmol/L (ref <=527)
TRIGLYCERIDES BY NMR: 62 mg/dL (ref 0–149)

## 2015-07-19 LAB — BMP8+EGFR
BUN/Creatinine Ratio: 26 (ref 11–26)
BUN: 23 mg/dL (ref 8–27)
CALCIUM: 10 mg/dL (ref 8.7–10.3)
CHLORIDE: 99 mmol/L (ref 96–106)
CO2: 28 mmol/L (ref 18–29)
Creatinine, Ser: 0.87 mg/dL (ref 0.57–1.00)
GFR calc Af Amer: 73 mL/min/{1.73_m2} (ref >59)
GFR calc non Af Amer: 63 mL/min/{1.73_m2} (ref >59)
GLUCOSE: 92 mg/dL (ref 65–99)
Potassium: 4.4 mmol/L (ref 3.5–5.2)
Sodium: 141 mmol/L (ref 134–144)

## 2015-07-19 LAB — VITAMIN D 25 HYDROXY (VIT D DEFICIENCY, FRACTURES): VIT D 25 HYDROXY: 48.6 ng/mL (ref 30.0–100.0)

## 2015-07-20 DIAGNOSIS — L97321 Non-pressure chronic ulcer of left ankle limited to breakdown of skin: Secondary | ICD-10-CM | POA: Diagnosis not present

## 2015-07-20 DIAGNOSIS — D1800 Hemangioma unspecified site: Secondary | ICD-10-CM | POA: Diagnosis not present

## 2015-07-20 DIAGNOSIS — L821 Other seborrheic keratosis: Secondary | ICD-10-CM | POA: Diagnosis not present

## 2015-07-21 ENCOUNTER — Telehealth: Payer: Self-pay | Admitting: Family Medicine

## 2015-07-24 NOTE — Telephone Encounter (Signed)
Pt states she figured it out but will CB with any further questions or concerns.

## 2015-08-02 DIAGNOSIS — I872 Venous insufficiency (chronic) (peripheral): Secondary | ICD-10-CM | POA: Diagnosis not present

## 2015-08-02 DIAGNOSIS — L97321 Non-pressure chronic ulcer of left ankle limited to breakdown of skin: Secondary | ICD-10-CM | POA: Diagnosis not present

## 2015-08-05 ENCOUNTER — Other Ambulatory Visit: Payer: Self-pay | Admitting: Family Medicine

## 2015-08-07 MED ORDER — METOPROLOL SUCCINATE ER 25 MG PO TB24: 12.5000 mg | ORAL_TABLET | Freq: Every day | ORAL | Status: DC

## 2015-08-07 MED ORDER — DULOXETINE HCL 60 MG PO CPEP: 60.0000 mg | ORAL_CAPSULE | Freq: Every day | ORAL | Status: DC

## 2015-08-07 NOTE — Telephone Encounter (Signed)
done

## 2015-08-08 ENCOUNTER — Other Ambulatory Visit: Payer: Self-pay | Admitting: *Deleted

## 2015-08-08 ENCOUNTER — Other Ambulatory Visit: Payer: Self-pay | Admitting: Family Medicine

## 2015-08-08 DIAGNOSIS — R600 Localized edema: Secondary | ICD-10-CM

## 2015-08-08 DIAGNOSIS — L97321 Non-pressure chronic ulcer of left ankle limited to breakdown of skin: Secondary | ICD-10-CM | POA: Diagnosis not present

## 2015-08-08 DIAGNOSIS — I872 Venous insufficiency (chronic) (peripheral): Secondary | ICD-10-CM | POA: Diagnosis not present

## 2015-08-08 DIAGNOSIS — M79605 Pain in left leg: Secondary | ICD-10-CM | POA: Diagnosis not present

## 2015-08-09 ENCOUNTER — Ambulatory Visit (HOSPITAL_COMMUNITY): Admission: RE | Admit: 2015-08-09 | Payer: Medicare Other | Source: Ambulatory Visit

## 2015-08-09 ENCOUNTER — Ambulatory Visit (HOSPITAL_COMMUNITY)
Admission: RE | Admit: 2015-08-09 | Discharge: 2015-08-09 | Disposition: A | Discharge status: Home / Self Care | Payer: Medicare Other | Source: Ambulatory Visit | Attending: Family Medicine | Admitting: Family Medicine

## 2015-08-09 ENCOUNTER — Other Ambulatory Visit: Payer: Self-pay | Admitting: Family Medicine

## 2015-08-09 DIAGNOSIS — R600 Localized edema: Secondary | ICD-10-CM | POA: Diagnosis not present

## 2015-08-09 DIAGNOSIS — R609 Edema, unspecified: Secondary | ICD-10-CM

## 2015-08-16 DIAGNOSIS — L97329 Non-pressure chronic ulcer of left ankle with unspecified severity: Secondary | ICD-10-CM | POA: Diagnosis not present

## 2015-08-16 DIAGNOSIS — D6832 Hemorrhagic disorder due to extrinsic circulating anticoagulants: Secondary | ICD-10-CM | POA: Diagnosis not present

## 2015-08-16 DIAGNOSIS — Z7901 Long term (current) use of anticoagulants: Secondary | ICD-10-CM | POA: Diagnosis not present

## 2015-08-16 DIAGNOSIS — I83023 Varicose veins of left lower extremity with ulcer of ankle: Secondary | ICD-10-CM | POA: Diagnosis not present

## 2015-08-16 DIAGNOSIS — Z79899 Other long term (current) drug therapy: Secondary | ICD-10-CM | POA: Diagnosis not present

## 2015-08-16 DIAGNOSIS — T887XXA Unspecified adverse effect of drug or medicament, initial encounter: Secondary | ICD-10-CM | POA: Diagnosis not present

## 2015-08-16 DIAGNOSIS — I10 Essential (primary) hypertension: Secondary | ICD-10-CM | POA: Diagnosis not present

## 2015-08-16 DIAGNOSIS — L97322 Non-pressure chronic ulcer of left ankle with fat layer exposed: Secondary | ICD-10-CM | POA: Diagnosis not present

## 2015-08-22 DIAGNOSIS — I83023 Varicose veins of left lower extremity with ulcer of ankle: Secondary | ICD-10-CM | POA: Diagnosis not present

## 2015-08-22 DIAGNOSIS — L97929 Non-pressure chronic ulcer of unspecified part of left lower leg with unspecified severity: Secondary | ICD-10-CM | POA: Diagnosis not present

## 2015-08-22 DIAGNOSIS — L97322 Non-pressure chronic ulcer of left ankle with fat layer exposed: Secondary | ICD-10-CM | POA: Diagnosis not present

## 2015-08-23 ENCOUNTER — Telehealth: Payer: Self-pay | Admitting: Cardiology

## 2015-08-23 DIAGNOSIS — L97322 Non-pressure chronic ulcer of left ankle with fat layer exposed: Secondary | ICD-10-CM | POA: Diagnosis not present

## 2015-08-23 DIAGNOSIS — I83023 Varicose veins of left lower extremity with ulcer of ankle: Secondary | ICD-10-CM | POA: Diagnosis not present

## 2015-08-23 NOTE — Telephone Encounter (Signed)
New Message  Pt called states that she had a sore on her leg she went to a specialist and it bled for a few days. She went to  The hospital in [place] and they advised her to stop taking eliquis. She states it bleed a steady flow for 2 days straight. Please call back to discuss.

## 2015-08-23 NOTE — Telephone Encounter (Signed)
Pt of Dr Percival Spanish.  Pt calling to let us know she had a sore on her leg which opened last week on 4/12. She went to [facility], Dr Nils Pyle, in [place], who is treating it.  Pt states is bled slowly like "drip, drip, drip" for two days straight. She went to the ER in [place] and the ED physician had her stop her Eliquis on 4/13.  Pt saw Dr Nils Pyle today who is still treating her wound who said she could restart her Eliquis. She is afraid to restart it and want to make sure with her cardiologist first. The wound is no longer oozing and is being treated and wrapped by the wound care center at [facility].  Encouraged pt that if Dr Nils Pyle felt she could go ahead to start the Eliquis then he felt she was stable but will route to DOD for further recommendation if necessary.  Routing to Dr. Debara Pickett.

## 2015-08-24 NOTE — Telephone Encounter (Signed)
Yes ,, restart it if the bleeding has stopped.  Dr. Lemmie Evens

## 2015-08-24 NOTE — Telephone Encounter (Signed)
Called pt back and told her it was ok to restart Eliquis if her bleeding has stopped. Pt verbalized understanding.

## 2015-08-28 ENCOUNTER — Encounter: Payer: Self-pay | Admitting: Family Medicine

## 2015-08-28 ENCOUNTER — Ambulatory Visit (INDEPENDENT_AMBULATORY_CARE_PROVIDER_SITE_OTHER): Payer: Medicare Other

## 2015-08-28 ENCOUNTER — Ambulatory Visit (INDEPENDENT_AMBULATORY_CARE_PROVIDER_SITE_OTHER): Payer: Medicare Other | Admitting: Family Medicine

## 2015-08-28 VITALS — BP 101/47 | HR 76 | Temp 98.5°F | Ht 64.0 in | Wt 133.0 lb

## 2015-08-28 DIAGNOSIS — J069 Acute upper respiratory infection, unspecified: Secondary | ICD-10-CM | POA: Diagnosis not present

## 2015-08-28 DIAGNOSIS — I480 Paroxysmal atrial fibrillation: Secondary | ICD-10-CM | POA: Diagnosis not present

## 2015-08-28 DIAGNOSIS — I10 Essential (primary) hypertension: Secondary | ICD-10-CM | POA: Diagnosis not present

## 2015-08-28 DIAGNOSIS — I421 Obstructive hypertrophic cardiomyopathy: Secondary | ICD-10-CM

## 2015-08-28 DIAGNOSIS — F411 Generalized anxiety disorder: Secondary | ICD-10-CM | POA: Diagnosis not present

## 2015-08-28 DIAGNOSIS — R05 Cough: Secondary | ICD-10-CM

## 2015-08-28 DIAGNOSIS — Z1211 Encounter for screening for malignant neoplasm of colon: Secondary | ICD-10-CM

## 2015-08-28 DIAGNOSIS — R059 Cough, unspecified: Secondary | ICD-10-CM

## 2015-08-28 MED ORDER — LISINOPRIL 40 MG PO TABS: 40.0000 mg | ORAL_TABLET | Freq: Every day | ORAL | Status: DC

## 2015-08-28 MED ORDER — FUROSEMIDE 20 MG PO TABS: 20.0000 mg | ORAL_TABLET | Freq: Every day | ORAL | Status: DC

## 2015-08-28 MED ORDER — METOPROLOL SUCCINATE ER 25 MG PO TB24: 12.5000 mg | ORAL_TABLET | Freq: Every day | ORAL | Status: DC

## 2015-08-28 MED ORDER — AMLODIPINE BESYLATE 2.5 MG PO TABS: 2.5000 mg | ORAL_TABLET | Freq: Every day | ORAL | Status: DC

## 2015-08-28 MED ORDER — DULOXETINE HCL 60 MG PO CPEP: 60.0000 mg | ORAL_CAPSULE | Freq: Every day | ORAL | Status: DC

## 2015-08-28 MED ORDER — AZITHROMYCIN 250 MG PO TABS: ORAL_TABLET | ORAL | Status: DC

## 2015-08-28 NOTE — Addendum Note (Signed)
Addended by: Liliane Bade on: 08/28/2015 02:56 PM   Modules accepted: Orders

## 2015-08-28 NOTE — Patient Instructions (Signed)
The patient should drink plenty of fluids and stay well hydrated She should take the plain Mucinex maximum strength 1 twice daily with a large glass of water She should use nasal saline 1 spray each nostril 3 or 4 times daily She should use Flonase nasal spray 1 spray each nostril at bedtime She should take the antibiotic as directed She should practice voice rest and gargle with warm salty water frequently through the day She should follow-up with the wound clinic and specifically asked them if and when she can restart her blood thinner because of her history of atrial fibrillation.

## 2015-08-28 NOTE — Progress Notes (Signed)
Subjective:    Patient ID: Patricia Villa, female    DOB: April 29, 1933, 82 y.o.   MRN: [id number]  HPI Patient here today for cough, congestion and hoarseness that started about 3 weeks ago. She is accompanied today by her sister. The patient has slight cough but congestion and hoarseness. She has not had any fever. She will get a chest x-ray today and a CBC. This started 3 weeks ago. She is seeing the wound clinic in evening for an ankle and foot wound on the left foot. Is also important to note she is not taking her Eliquis regularly. The patient has not been running any fever as mentioned. The sputum did have some slight color to it today. She is going to the wound clinic for what sounds like venous insufficiency with a ulcer in her lower ankle. We will request to get records from her next visit there. She's had some drainage in her throat.     Patient Active Problem List   Diagnosis Date Noted  . Atrial fibrillation ([place]) 11/23/2014  . Osteoporosis 08/31/2014  . Chronic neck pain 06/07/2014  . DDD (degenerative disc disease), cervical 01/24/2014  . Hyperlipidemia 05/17/2013  . Colitis 01/20/2013  . Generalized anxiety disorder 08/05/2012  . Meningioma ([place]) 08/05/2012  . Bruit 10/17/2010  . Essential hypertension, benign 10/04/2009  . MITRAL INSUFFICIENCY 09/26/2009  . Mitral valve disorder 09/26/2009  . Hypertrophic obstructive cardiomyopathy ([place]) 09/26/2009   Outpatient Encounter Prescriptions as of 08/28/2015  Medication Sig  . amLODipine (NORVASC) 2.5 MG tablet TAKE ONE TABLET BY MOUTH ONE TIME DAILY  . apixaban (ELIQUIS) 5 MG TABS tablet Take 1 tablet (5 mg total) by mouth 2 (two) times daily.  . Cholecalciferol (VITAMIN D3) 2000 UNITS TABS Take 2,000 Units by mouth daily.  . clobetasol cream (TEMOVATE) 0.05 % Apply topically 2 (two) times daily.  . DULoxetine (CYMBALTA) 60 MG capsule Take 1 capsule (60 mg total) by mouth daily.  . furosemide (LASIX) 20 MG tablet Take 1  tablet (20 mg total) by mouth daily.  Marland Kitchen lisinopril (PRINIVIL,ZESTRIL) 40 MG tablet Take 1 tablet (40 mg total) by mouth daily.  . metoprolol succinate (TOPROL-XL) 25 MG 24 hr tablet Take 0.5 tablets (12.5 mg total) by mouth daily. Take 1/2 tablet by mouth daily  . polyethylene glycol (MIRALAX / GLYCOLAX) packet Take 17 g by mouth daily as needed (for constipation.).   No facility-administered encounter medications on file as of 08/28/2015.      Review of Systems  Constitutional: Negative.  Negative for fever.  HENT: Positive for congestion and trouble swallowing.   Eyes: Negative.   Respiratory: Positive for cough.   Cardiovascular: Negative.   Gastrointestinal: Negative.   Endocrine: Negative.   Genitourinary: Negative.   Musculoskeletal: Negative.   Skin: Negative.   Allergic/Immunologic: Negative.   Neurological: Negative.   Hematological: Negative.   Psychiatric/Behavioral: Negative.        Objective:   Physical Exam  Constitutional: She is oriented to person, place, and time. She appears distressed.  Thin and somewhat confused and with trouble remembering today.  HENT:  Head: Normocephalic and atraumatic.  Right Ear: External ear normal.  Left Ear: External ear normal.  Mouth/Throat: Oropharynx is clear and moist. No oropharyngeal exudate.  Nasal congestion bilaterally  Eyes: Conjunctivae and EOM are normal. Pupils are equal, round, and reactive to light. Right eye exhibits no discharge. Left eye exhibits no discharge. No scleral icterus.  Neck: Normal range of motion. Neck supple.  No thyromegaly present.  Cardiovascular: Normal rate.  Exam reveals no friction rub.   No murmur heard. Slightly irregular irregular at 72/m  Pulmonary/Chest: Effort normal and breath sounds normal. She has no wheezes. She has no rales.  Good breath sounds bilaterally, airway irritation appears more upper airway and throat.  Musculoskeletal: Normal range of motion. She exhibits no edema.    Neurological: She is alert and oriented to person, place, and time.  Skin: Skin is warm and dry. No rash noted.  Psychiatric: She has a normal mood and affect. Her behavior is normal. Thought content normal.  Nursing note and vitals reviewed.   BP 101/47 mmHg  Pulse 76  Temp(Src) 98.5 F (36.9 C) (Oral)  Ht 5\' 4"  (1.626 m)  Wt 133 lb (60.328 kg)  BMI 22.82 kg/m2  LMP 08/01/1982       Assessment & Plan:  1. Cough -Take Mucinex for cough 1 twice daily with a large glass of water - CBC with Differential/Platelet - DG Chest 2 View; Future  2. Paroxysmal atrial fibrillation ([place]) -The patient will discuss with the wound clinic doctor about restarting her Eliquis    3. Generalized anxiety disorder -She seems somewhat anxious and confused today.  4. Essential hypertension, benign -The blood pressure is good today.  5. Hypertrophic obstructive cardiomyopathy ([place]) -Continue to follow-up with cardiology  6. URI, acute -Take antibiotic as directed -Drink plenty of fluids and stay well hydrated -Take Mucinex maximum strength twice daily with a large glass of water -Use Flonase and nasal saline -The patient may need a short course of prednisone. We will wait till x-rays are returned and CBCs return before we make that decision  Meds ordered this encounter  Medications  . azithromycin (ZITHROMAX) 250 MG tablet    Sig: As directed    Dispense:  6 tablet    Refill:  0   Patient Instructions  The patient should drink plenty of fluids and stay well hydrated She should take the plain Mucinex maximum strength 1 twice daily with a large glass of water She should use nasal saline 1 spray each nostril 3 or 4 times daily She should use Flonase nasal spray 1 spray each nostril at bedtime She should take the antibiotic as directed She should practice voice rest and gargle with warm salty water frequently through the day She should follow-up with the wound clinic and specifically  asked them if and when she can restart her blood thinner because of her history of atrial fibrillation.   Arrie Senate MD

## 2015-08-28 NOTE — Addendum Note (Signed)
Addended by: Zannie Cove on: 08/28/2015 02:26 PM   Modules accepted: Orders

## 2015-08-29 LAB — CBC WITH DIFFERENTIAL/PLATELET
Basophils Absolute: 0.1 10*3/uL (ref 0.0–0.2)
Basos: 1 %
EOS (ABSOLUTE): 0.1 10*3/uL (ref 0.0–0.4)
Eos: 2 %
HEMOGLOBIN: 10 g/dL — AB (ref 11.1–15.9)
Hematocrit: 31.7 % — ABNORMAL LOW (ref 34.0–46.6)
IMMATURE GRANS (ABS): 0 10*3/uL (ref 0.0–0.1)
Immature Granulocytes: 0 %
LYMPHS ABS: 1.9 10*3/uL (ref 0.7–3.1)
Lymphs: 25 %
MCH: 30.7 pg (ref 26.6–33.0)
MCHC: 31.5 g/dL (ref 31.5–35.7)
MCV: 97 fL (ref 79–97)
MONOCYTES: 11 %
Monocytes Absolute: 0.8 10*3/uL (ref 0.1–0.9)
NEUTROS ABS: 4.7 10*3/uL (ref 1.4–7.0)
Neutrophils: 61 %
Platelets: 286 10*3/uL (ref 150–379)
RBC: 3.26 x10E6/uL — ABNORMAL LOW (ref 3.77–5.28)
RDW: 14.2 % (ref 12.3–15.4)
WBC: 7.6 10*3/uL (ref 3.4–10.8)

## 2015-08-30 DIAGNOSIS — I83023 Varicose veins of left lower extremity with ulcer of ankle: Secondary | ICD-10-CM | POA: Diagnosis not present

## 2015-08-30 DIAGNOSIS — L97322 Non-pressure chronic ulcer of left ankle with fat layer exposed: Secondary | ICD-10-CM | POA: Diagnosis not present

## 2015-08-30 LAB — FECAL OCCULT BLOOD, IMMUNOCHEMICAL: Fecal Occult Bld: NEGATIVE

## 2015-08-31 ENCOUNTER — Encounter: Payer: Self-pay | Admitting: Nurse Practitioner

## 2015-08-31 ENCOUNTER — Ambulatory Visit (INDEPENDENT_AMBULATORY_CARE_PROVIDER_SITE_OTHER): Payer: Medicare Other | Admitting: Nurse Practitioner

## 2015-08-31 VITALS — BP 115/67 | HR 70 | Temp 98.3°F | Ht 64.0 in | Wt 136.0 lb

## 2015-08-31 DIAGNOSIS — J020 Streptococcal pharyngitis: Secondary | ICD-10-CM

## 2015-08-31 DIAGNOSIS — J029 Acute pharyngitis, unspecified: Secondary | ICD-10-CM

## 2015-08-31 LAB — RAPID STREP SCREEN (MED CTR MEBANE ONLY): Strep Gp A Ag, IA W/Reflex: POSITIVE — AB

## 2015-08-31 MED ORDER — AMOXICILLIN 875 MG PO TABS: 875.0000 mg | ORAL_TABLET | Freq: Two times a day (BID) | ORAL | Status: DC

## 2015-08-31 NOTE — Progress Notes (Signed)
   Subjective:    Patient ID: Patricia Villa, female    DOB: 1932-09-09, 82 y.o.   MRN: [id number]  HPI  Patient in c/o sore throat- she was seen by Dr. Laurance Flatten last week and has gotten no better. Says that she cannot hardly swallow.   Review of Systems  Constitutional: Negative.  Negative for fever.  HENT: Positive for congestion and sore throat.   Respiratory: Negative.   Cardiovascular: Negative.   Genitourinary: Negative.   Neurological: Negative.   Psychiatric/Behavioral: Negative.   All other systems reviewed and are negative.      Objective:   Physical Exam  Constitutional: She appears well-developed and well-nourished.  HENT:  Right Ear: External ear normal.  Left Ear: External ear normal.  Nose: Nose normal.  Mouth/Throat: Posterior oropharyngeal erythema present.  Neck: Normal range of motion.  Cardiovascular: Normal rate.   Pulmonary/Chest: Effort normal and breath sounds normal.  Neurological: She is alert.  Skin: Skin is warm.  Psychiatric: She has a normal mood and affect. Her behavior is normal. Judgment and thought content normal.    BP 115/67 mmHg  Pulse 70  Temp(Src) 98.3 F (36.8 C) (Oral)  Ht 5\' 4"  (1.626 m)  Wt 136 lb (61.689 kg)  BMI 23.33 kg/m2  SpO2 99%  LMP 08/01/1982      Assessment & Plan:   1. Sore throat   2. Streptococcal sore throat    Meds ordered this encounter  Medications  . amoxicillin (AMOXIL) 875 MG tablet    Sig: Take 1 tablet (875 mg total) by mouth 2 (two) times daily. 1 po BID    Dispense:  20 tablet    Refill:  0    Order Specific Question:  Supervising Provider    Answer:  Joycelyn Man   Force fluids Motrin or tylenol OTC OTC decongestant Throat lozenges if help New toothbrush in 3 days  Mary-Margaret Hassell Done, FNP

## 2015-08-31 NOTE — Addendum Note (Signed)
Addended by: Chevis Pretty on: 08/31/2015 05:27 PM   Modules accepted: Miquel Dunn

## 2015-08-31 NOTE — Patient Instructions (Signed)

## 2015-09-01 DIAGNOSIS — I10 Essential (primary) hypertension: Secondary | ICD-10-CM | POA: Diagnosis not present

## 2015-09-01 DIAGNOSIS — Z7901 Long term (current) use of anticoagulants: Secondary | ICD-10-CM | POA: Diagnosis not present

## 2015-09-01 DIAGNOSIS — L97329 Non-pressure chronic ulcer of left ankle with unspecified severity: Secondary | ICD-10-CM | POA: Diagnosis not present

## 2015-09-01 DIAGNOSIS — M6281 Muscle weakness (generalized): Secondary | ICD-10-CM | POA: Diagnosis not present

## 2015-09-01 DIAGNOSIS — I341 Nonrheumatic mitral (valve) prolapse: Secondary | ICD-10-CM | POA: Diagnosis not present

## 2015-09-01 DIAGNOSIS — I872 Venous insufficiency (chronic) (peripheral): Secondary | ICD-10-CM | POA: Diagnosis not present

## 2015-09-06 ENCOUNTER — Encounter: Payer: Self-pay | Admitting: Family Medicine

## 2015-09-06 ENCOUNTER — Ambulatory Visit (INDEPENDENT_AMBULATORY_CARE_PROVIDER_SITE_OTHER): Payer: Medicare Other | Admitting: Family Medicine

## 2015-09-06 ENCOUNTER — Ambulatory Visit: Payer: Medicare Other | Admitting: Family Medicine

## 2015-09-06 VITALS — BP 112/62 | HR 75 | Temp 97.3°F | Ht 64.0 in | Wt 135.4 lb

## 2015-09-06 DIAGNOSIS — R490 Dysphonia: Secondary | ICD-10-CM | POA: Diagnosis not present

## 2015-09-06 DIAGNOSIS — L97329 Non-pressure chronic ulcer of left ankle with unspecified severity: Secondary | ICD-10-CM | POA: Diagnosis not present

## 2015-09-06 DIAGNOSIS — Z7901 Long term (current) use of anticoagulants: Secondary | ICD-10-CM | POA: Diagnosis not present

## 2015-09-06 DIAGNOSIS — I341 Nonrheumatic mitral (valve) prolapse: Secondary | ICD-10-CM | POA: Diagnosis not present

## 2015-09-06 DIAGNOSIS — I10 Essential (primary) hypertension: Secondary | ICD-10-CM | POA: Diagnosis not present

## 2015-09-06 DIAGNOSIS — B3789 Other sites of candidiasis: Secondary | ICD-10-CM

## 2015-09-06 DIAGNOSIS — M6281 Muscle weakness (generalized): Secondary | ICD-10-CM | POA: Diagnosis not present

## 2015-09-06 DIAGNOSIS — I872 Venous insufficiency (chronic) (peripheral): Secondary | ICD-10-CM | POA: Diagnosis not present

## 2015-09-06 MED ORDER — FLUCONAZOLE 100 MG PO TABS: 100.0000 mg | ORAL_TABLET | Freq: Every day | ORAL | Status: DC

## 2015-09-06 MED ORDER — BETAMETHASONE SOD PHOS & ACET 6 (3-3) MG/ML IJ SUSP
6.0000 mg | Freq: Once | INTRAMUSCULAR | Status: AC
  Administered 2015-09-06: 6 mg via INTRAMUSCULAR

## 2015-09-06 NOTE — Progress Notes (Signed)
Subjective:  Patient ID: Patricia Villa, female    DOB: 14-Aug-1932  Age: 82 y.o. MRN: [id number]  CC: Hoarse and Sore Throat   HPI Patricia Villa presents for Patient presents with upper respiratory congestion. No Rhinorrhea.. There is moderate sore throat. Patient reports coughing intermittently, but the main symptom is three weeks of persistent hoarseness There is no fever no chills no sweats. The patient denies being short of breath. Onset was 3 weekss ago. Gradually worsening in spite of z-pack followed by a week of amoxicillin 875 BID.   History Patricia Villa has a past medical history of Hypertrophic cardiomyopathy ([place]); Mild mitral valve prolapse; Mitral insufficiency; Hypertension; Meningioma ([place]); Esotropia; Hyperlipidemia; Postmenopausal HRT (hormone replacement therapy); Arthritis; Alopecia; Diverticulosis of colon; DDD (degenerative disc disease), cervical; Anxiety; Osteoporosis; Cataract; and History of double vision.   She has past surgical history that includes Abdominal hysterectomy; Cataract extraction, bilateral; Eye surgery; Vein Surgery; and Tonsillectomy.   Her family history includes Asthma in her mother; COPD in her brother; Cancer in her cousin; Diabetes in her brother; Heart disease in her brother; Hyperlipidemia in her brother; Hypertension in her brother; Other in her brother, sister, and sister.She reports that she has never smoked. She has never used smokeless tobacco. She reports that she does not drink alcohol or use illicit drugs.    ROS Review of Systems  Constitutional: Negative for fever, chills, activity change and appetite change.  HENT: Positive for congestion, postnasal drip, rhinorrhea and sinus pressure. Negative for ear discharge, ear pain, hearing loss, nosebleeds, sneezing and trouble swallowing.   Respiratory: Negative for chest tightness and shortness of breath.   Cardiovascular: Negative for chest pain and palpitations.  Skin: Negative for  rash.    Objective:  BP 112/62 mmHg  Pulse 75  Temp(Src) 97.3 F (36.3 C) (Oral)  Ht 5\' 4"  (1.626 m)  Wt 135 lb 6.4 oz (61.417 kg)  BMI 23.23 kg/m2  SpO2 99%  LMP 08/01/1982  BP Readings from Last 3 Encounters:  09/06/15 112/62  08/31/15 115/67  08/28/15 101/47    Wt Readings from Last 3 Encounters:  09/06/15 135 lb 6.4 oz (61.417 kg)  08/31/15 136 lb (61.689 kg)  08/28/15 133 lb (60.328 kg)     Physical Exam  Constitutional: She appears well-developed and well-nourished.  HENT:  Head: Normocephalic and atraumatic.  Right Ear: Tympanic membrane and external ear normal. No decreased hearing is noted.  Left Ear: Tympanic membrane and external ear normal. No decreased hearing is noted.  Nose: Mucosal edema present. Right sinus exhibits no frontal sinus tenderness. Left sinus exhibits no frontal sinus tenderness.  Mouth/Throat: Mucous membranes are normal. Posterior oropharyngeal edema and posterior oropharyngeal erythema present. No oropharyngeal exudate or tonsillar abscesses.  Severe hoarseness  Neck: No Brudzinski's sign noted.  Pulmonary/Chest: Breath sounds normal. No respiratory distress.  Lymphadenopathy:       Head (right side): No preauricular adenopathy present.       Head (left side): No preauricular adenopathy present.       Right cervical: No superficial cervical adenopathy present.      Left cervical: No superficial cervical adenopathy present.     Lab Results  Component Value Date   WBC 7.6 08/28/2015   HGB 11.4* 10/18/2014   HCT 31.7* 08/28/2015   PLT 286 08/28/2015   GLUCOSE 92 07/18/2015   CHOL 165 07/18/2015   TRIG 62 07/18/2015   HDL 55 07/18/2015   LDLCALC 119* 01/27/2014   ALT 20 07/18/2015  AST 21 07/18/2015   NA 141 07/18/2015   K 4.4 07/18/2015   CL 99 07/18/2015   CREATININE 0.87 07/18/2015   BUN 23 07/18/2015   CO2 28 07/18/2015   TSH 0.927 11/09/2014   INR 1.09 01/16/2010    US Venous Img Lower Unilateral Left  08/09/2015   CLINICAL DATA:  Left lower extremity pain and edema. History of varicose veins. Evaluate for DVT. EXAM: LEFT LOWER EXTREMITY VENOUS DOPPLER ULTRASOUND TECHNIQUE: Gray-scale sonography with graded compression, as well as color Doppler and duplex ultrasound were performed to evaluate the lower extremity deep venous systems from the level of the common femoral vein and including the common femoral, femoral, profunda femoral, popliteal and calf veins including the posterior tibial, peroneal and gastrocnemius veins when visible. The superficial great saphenous vein was also interrogated. Spectral Doppler was utilized to evaluate flow at rest and with distal augmentation maneuvers in the common femoral, femoral and popliteal veins. COMPARISON:  None. FINDINGS: Contralateral Common Femoral Vein: Respiratory phasicity is normal and symmetric with the symptomatic side. No evidence of thrombus. Normal compressibility. Common Femoral Vein: No evidence of thrombus. Normal compressibility, respiratory phasicity and response to augmentation. Saphenofemoral Junction: No evidence of thrombus. Normal compressibility and flow on color Doppler imaging. Profunda Femoral Vein: No evidence of thrombus. Normal compressibility and flow on color Doppler imaging. Femoral Vein: No evidence of thrombus. Normal compressibility, respiratory phasicity and response to augmentation. Popliteal Vein: No evidence of thrombus. Normal compressibility, respiratory phasicity and response to augmentation. Calf Veins: No evidence of thrombus. Normal compressibility and flow on color Doppler imaging. Superficial Great Saphenous Vein: No evidence of thrombus. Normal compressibility and flow on color Doppler imaging. Venous Reflux:  None. Other Findings:  None. IMPRESSION: No evidence of DVT within the left lower extremity. Electronically Signed   By: Sandi Mariscal M.D.   On: 08/09/2015 12:14    Assessment & Plan:   Patricia Villa was seen today for hoarse and sore  throat.  Diagnoses and all orders for this visit:  Yeast pharyngitis  Hoarseness, persistent -     betamethasone acetate-betamethasone sodium phosphate (CELESTONE) injection 6 mg; Inject 1 mL (6 mg total) into the muscle once.  Other orders -     fluconazole (DIFLUCAN) 100 MG tablet; Take 1 tablet (100 mg total) by mouth daily.      I have discontinued Patricia Villa's azithromycin and amoxicillin. I am also having her start on fluconazole. Additionally, I am having her maintain her Vitamin D3, polyethylene glycol, clobetasol cream, apixaban, amLODipine, DULoxetine, furosemide, lisinopril, and metoprolol succinate. We administered betamethasone acetate-betamethasone sodium phosphate.  Meds ordered this encounter  Medications  . betamethasone acetate-betamethasone sodium phosphate (CELESTONE) injection 6 mg    Sig:   . fluconazole (DIFLUCAN) 100 MG tablet    Sig: Take 1 tablet (100 mg total) by mouth daily.    Dispense:  15 tablet    Refill:  0     Follow-up: Return if symptoms worsen or fail to improve.  Claretta Fraise, M.D.

## 2015-09-07 ENCOUNTER — Ambulatory Visit: Payer: Medicare Other | Admitting: Family Medicine

## 2015-09-08 DIAGNOSIS — I341 Nonrheumatic mitral (valve) prolapse: Secondary | ICD-10-CM | POA: Diagnosis not present

## 2015-09-08 DIAGNOSIS — L97329 Non-pressure chronic ulcer of left ankle with unspecified severity: Secondary | ICD-10-CM | POA: Diagnosis not present

## 2015-09-08 DIAGNOSIS — Z7901 Long term (current) use of anticoagulants: Secondary | ICD-10-CM | POA: Diagnosis not present

## 2015-09-08 DIAGNOSIS — I10 Essential (primary) hypertension: Secondary | ICD-10-CM | POA: Diagnosis not present

## 2015-09-08 DIAGNOSIS — I872 Venous insufficiency (chronic) (peripheral): Secondary | ICD-10-CM | POA: Diagnosis not present

## 2015-09-08 DIAGNOSIS — M6281 Muscle weakness (generalized): Secondary | ICD-10-CM | POA: Diagnosis not present

## 2015-09-11 ENCOUNTER — Telehealth: Payer: Self-pay | Admitting: Family Medicine

## 2015-09-11 DIAGNOSIS — R490 Dysphonia: Secondary | ICD-10-CM

## 2015-09-11 DIAGNOSIS — J312 Chronic pharyngitis: Secondary | ICD-10-CM

## 2015-09-11 NOTE — Telephone Encounter (Signed)
Spoke to pt and she states she hasn't gotten any better and that she was told if she didn't get better we would send her to an ENT. Referral placed and pt aware will get a call with the appt details once the appt is made.

## 2015-09-12 ENCOUNTER — Other Ambulatory Visit: Payer: Self-pay | Admitting: *Deleted

## 2015-09-13 ENCOUNTER — Ambulatory Visit: Payer: Medicare Other | Admitting: Family Medicine

## 2015-09-13 DIAGNOSIS — I341 Nonrheumatic mitral (valve) prolapse: Secondary | ICD-10-CM | POA: Diagnosis not present

## 2015-09-13 DIAGNOSIS — L97329 Non-pressure chronic ulcer of left ankle with unspecified severity: Secondary | ICD-10-CM | POA: Diagnosis not present

## 2015-09-13 DIAGNOSIS — M6281 Muscle weakness (generalized): Secondary | ICD-10-CM | POA: Diagnosis not present

## 2015-09-13 DIAGNOSIS — I10 Essential (primary) hypertension: Secondary | ICD-10-CM | POA: Diagnosis not present

## 2015-09-13 DIAGNOSIS — I872 Venous insufficiency (chronic) (peripheral): Secondary | ICD-10-CM | POA: Diagnosis not present

## 2015-09-13 DIAGNOSIS — Z7901 Long term (current) use of anticoagulants: Secondary | ICD-10-CM | POA: Diagnosis not present

## 2015-09-14 ENCOUNTER — Telehealth: Payer: Self-pay | Admitting: Family Medicine

## 2015-09-19 ENCOUNTER — Telehealth: Payer: Self-pay | Admitting: Cardiology

## 2015-09-19 DIAGNOSIS — I059 Rheumatic mitral valve disease, unspecified: Secondary | ICD-10-CM

## 2015-09-19 NOTE — Telephone Encounter (Signed)
Pt called to schedule recall for July in  [place] recall pt needed echo prior which I have scheduled-however there isn't an order-can one be placed in epic please

## 2015-09-19 NOTE — Telephone Encounter (Signed)
Pt aware of appointment date/time and was given number to call to see if she can be seen sooner

## 2015-09-19 NOTE — Telephone Encounter (Signed)
Order placed

## 2015-09-20 DIAGNOSIS — L97329 Non-pressure chronic ulcer of left ankle with unspecified severity: Secondary | ICD-10-CM | POA: Diagnosis not present

## 2015-09-20 DIAGNOSIS — I341 Nonrheumatic mitral (valve) prolapse: Secondary | ICD-10-CM | POA: Diagnosis not present

## 2015-09-20 DIAGNOSIS — M6281 Muscle weakness (generalized): Secondary | ICD-10-CM | POA: Diagnosis not present

## 2015-09-20 DIAGNOSIS — R490 Dysphonia: Secondary | ICD-10-CM | POA: Diagnosis not present

## 2015-09-20 DIAGNOSIS — Z7901 Long term (current) use of anticoagulants: Secondary | ICD-10-CM | POA: Diagnosis not present

## 2015-09-20 DIAGNOSIS — I872 Venous insufficiency (chronic) (peripheral): Secondary | ICD-10-CM | POA: Diagnosis not present

## 2015-09-20 DIAGNOSIS — I10 Essential (primary) hypertension: Secondary | ICD-10-CM | POA: Diagnosis not present

## 2015-09-20 DIAGNOSIS — J3801 Paralysis of vocal cords and larynx, unilateral: Secondary | ICD-10-CM | POA: Diagnosis not present

## 2015-09-21 ENCOUNTER — Other Ambulatory Visit (INDEPENDENT_AMBULATORY_CARE_PROVIDER_SITE_OTHER): Payer: Self-pay | Admitting: Otolaryngology

## 2015-09-21 DIAGNOSIS — Z1231 Encounter for screening mammogram for malignant neoplasm of breast: Secondary | ICD-10-CM | POA: Diagnosis not present

## 2015-09-21 DIAGNOSIS — D329 Benign neoplasm of meninges, unspecified: Secondary | ICD-10-CM

## 2015-09-25 DIAGNOSIS — I872 Venous insufficiency (chronic) (peripheral): Secondary | ICD-10-CM | POA: Diagnosis not present

## 2015-09-25 DIAGNOSIS — L97329 Non-pressure chronic ulcer of left ankle with unspecified severity: Secondary | ICD-10-CM | POA: Diagnosis not present

## 2015-09-25 DIAGNOSIS — I341 Nonrheumatic mitral (valve) prolapse: Secondary | ICD-10-CM | POA: Diagnosis not present

## 2015-09-25 DIAGNOSIS — M6281 Muscle weakness (generalized): Secondary | ICD-10-CM | POA: Diagnosis not present

## 2015-09-25 DIAGNOSIS — I10 Essential (primary) hypertension: Secondary | ICD-10-CM | POA: Diagnosis not present

## 2015-09-25 DIAGNOSIS — Z7901 Long term (current) use of anticoagulants: Secondary | ICD-10-CM | POA: Diagnosis not present

## 2015-09-27 DIAGNOSIS — I872 Venous insufficiency (chronic) (peripheral): Secondary | ICD-10-CM | POA: Diagnosis not present

## 2015-09-27 DIAGNOSIS — L97322 Non-pressure chronic ulcer of left ankle with fat layer exposed: Secondary | ICD-10-CM | POA: Diagnosis not present

## 2015-09-27 DIAGNOSIS — I83023 Varicose veins of left lower extremity with ulcer of ankle: Secondary | ICD-10-CM | POA: Diagnosis not present

## 2015-09-27 DIAGNOSIS — L97329 Non-pressure chronic ulcer of left ankle with unspecified severity: Secondary | ICD-10-CM | POA: Diagnosis not present

## 2015-09-28 ENCOUNTER — Ambulatory Visit
Admission: RE | Admit: 2015-09-28 | Discharge: 2015-09-28 | Disposition: A | Discharge status: Home / Self Care | Payer: Medicare Other | Source: Ambulatory Visit | Attending: Otolaryngology | Admitting: Otolaryngology

## 2015-09-28 ENCOUNTER — Other Ambulatory Visit: Payer: Medicare Other

## 2015-09-28 DIAGNOSIS — D329 Benign neoplasm of meninges, unspecified: Secondary | ICD-10-CM | POA: Diagnosis not present

## 2015-09-28 MED ORDER — GADOBENATE DIMEGLUMINE 529 MG/ML IV SOLN
12.0000 mL | Freq: Once | INTRAVENOUS | Status: AC | PRN
  Administered 2015-09-28: 12 mL via INTRAVENOUS

## 2015-10-03 DIAGNOSIS — I872 Venous insufficiency (chronic) (peripheral): Secondary | ICD-10-CM | POA: Diagnosis not present

## 2015-10-03 DIAGNOSIS — L97329 Non-pressure chronic ulcer of left ankle with unspecified severity: Secondary | ICD-10-CM | POA: Diagnosis not present

## 2015-10-03 DIAGNOSIS — I10 Essential (primary) hypertension: Secondary | ICD-10-CM | POA: Diagnosis not present

## 2015-10-03 DIAGNOSIS — Z7901 Long term (current) use of anticoagulants: Secondary | ICD-10-CM | POA: Diagnosis not present

## 2015-10-03 DIAGNOSIS — M6281 Muscle weakness (generalized): Secondary | ICD-10-CM | POA: Diagnosis not present

## 2015-10-03 DIAGNOSIS — I341 Nonrheumatic mitral (valve) prolapse: Secondary | ICD-10-CM | POA: Diagnosis not present

## 2015-10-04 DIAGNOSIS — J3801 Paralysis of vocal cords and larynx, unilateral: Secondary | ICD-10-CM | POA: Diagnosis not present

## 2015-10-04 DIAGNOSIS — R490 Dysphonia: Secondary | ICD-10-CM | POA: Diagnosis not present

## 2015-10-06 ENCOUNTER — Other Ambulatory Visit (INDEPENDENT_AMBULATORY_CARE_PROVIDER_SITE_OTHER): Payer: Self-pay | Admitting: Otolaryngology

## 2015-10-06 DIAGNOSIS — J3800 Paralysis of vocal cords and larynx, unspecified: Secondary | ICD-10-CM

## 2015-10-09 DIAGNOSIS — I10 Essential (primary) hypertension: Secondary | ICD-10-CM | POA: Diagnosis not present

## 2015-10-09 DIAGNOSIS — Z7901 Long term (current) use of anticoagulants: Secondary | ICD-10-CM | POA: Diagnosis not present

## 2015-10-09 DIAGNOSIS — M6281 Muscle weakness (generalized): Secondary | ICD-10-CM | POA: Diagnosis not present

## 2015-10-09 DIAGNOSIS — I341 Nonrheumatic mitral (valve) prolapse: Secondary | ICD-10-CM | POA: Diagnosis not present

## 2015-10-09 DIAGNOSIS — I872 Venous insufficiency (chronic) (peripheral): Secondary | ICD-10-CM | POA: Diagnosis not present

## 2015-10-09 DIAGNOSIS — L97329 Non-pressure chronic ulcer of left ankle with unspecified severity: Secondary | ICD-10-CM | POA: Diagnosis not present

## 2015-10-10 ENCOUNTER — Ambulatory Visit
Admission: RE | Admit: 2015-10-10 | Discharge: 2015-10-10 | Disposition: A | Discharge status: Home / Self Care | Payer: Medicare Other | Source: Ambulatory Visit | Attending: Otolaryngology | Admitting: Otolaryngology

## 2015-10-10 DIAGNOSIS — J3800 Paralysis of vocal cords and larynx, unspecified: Secondary | ICD-10-CM

## 2015-10-10 DIAGNOSIS — R490 Dysphonia: Secondary | ICD-10-CM | POA: Diagnosis not present

## 2015-10-10 MED ORDER — IOPAMIDOL (ISOVUE-300) INJECTION 61%
75.0000 mL | Freq: Once | INTRAVENOUS | Status: AC | PRN
  Administered 2015-10-10: 75 mL via INTRAVENOUS

## 2015-10-18 DIAGNOSIS — I10 Essential (primary) hypertension: Secondary | ICD-10-CM | POA: Diagnosis not present

## 2015-10-18 DIAGNOSIS — M6281 Muscle weakness (generalized): Secondary | ICD-10-CM | POA: Diagnosis not present

## 2015-10-18 DIAGNOSIS — Z7901 Long term (current) use of anticoagulants: Secondary | ICD-10-CM | POA: Diagnosis not present

## 2015-10-18 DIAGNOSIS — L97329 Non-pressure chronic ulcer of left ankle with unspecified severity: Secondary | ICD-10-CM | POA: Diagnosis not present

## 2015-10-18 DIAGNOSIS — I872 Venous insufficiency (chronic) (peripheral): Secondary | ICD-10-CM | POA: Diagnosis not present

## 2015-10-18 DIAGNOSIS — I341 Nonrheumatic mitral (valve) prolapse: Secondary | ICD-10-CM | POA: Diagnosis not present

## 2015-10-23 DIAGNOSIS — I10 Essential (primary) hypertension: Secondary | ICD-10-CM | POA: Diagnosis not present

## 2015-10-23 DIAGNOSIS — Z86718 Personal history of other venous thrombosis and embolism: Secondary | ICD-10-CM | POA: Diagnosis not present

## 2015-10-23 DIAGNOSIS — R1310 Dysphagia, unspecified: Secondary | ICD-10-CM | POA: Diagnosis not present

## 2015-10-23 DIAGNOSIS — Z79899 Other long term (current) drug therapy: Secondary | ICD-10-CM | POA: Diagnosis not present

## 2015-10-23 DIAGNOSIS — Z87891 Personal history of nicotine dependence: Secondary | ICD-10-CM | POA: Diagnosis not present

## 2015-10-23 DIAGNOSIS — Z888 Allergy status to other drugs, medicaments and biological substances status: Secondary | ICD-10-CM | POA: Diagnosis not present

## 2015-10-23 DIAGNOSIS — J3801 Paralysis of vocal cords and larynx, unilateral: Secondary | ICD-10-CM | POA: Diagnosis not present

## 2015-10-23 DIAGNOSIS — R490 Dysphonia: Secondary | ICD-10-CM | POA: Diagnosis not present

## 2015-10-23 DIAGNOSIS — R1313 Dysphagia, pharyngeal phase: Secondary | ICD-10-CM | POA: Diagnosis not present

## 2015-10-23 DIAGNOSIS — I429 Cardiomyopathy, unspecified: Secondary | ICD-10-CM | POA: Diagnosis not present

## 2015-10-23 DIAGNOSIS — Z7982 Long term (current) use of aspirin: Secondary | ICD-10-CM | POA: Diagnosis not present

## 2015-10-24 DIAGNOSIS — L97329 Non-pressure chronic ulcer of left ankle with unspecified severity: Secondary | ICD-10-CM | POA: Diagnosis not present

## 2015-10-24 DIAGNOSIS — M6281 Muscle weakness (generalized): Secondary | ICD-10-CM | POA: Diagnosis not present

## 2015-10-24 DIAGNOSIS — I872 Venous insufficiency (chronic) (peripheral): Secondary | ICD-10-CM | POA: Diagnosis not present

## 2015-10-24 DIAGNOSIS — Z7901 Long term (current) use of anticoagulants: Secondary | ICD-10-CM | POA: Diagnosis not present

## 2015-10-24 DIAGNOSIS — I10 Essential (primary) hypertension: Secondary | ICD-10-CM | POA: Diagnosis not present

## 2015-10-24 DIAGNOSIS — I341 Nonrheumatic mitral (valve) prolapse: Secondary | ICD-10-CM | POA: Diagnosis not present

## 2015-10-25 ENCOUNTER — Telehealth: Payer: Self-pay | Admitting: *Deleted

## 2015-10-25 DIAGNOSIS — I872 Venous insufficiency (chronic) (peripheral): Secondary | ICD-10-CM | POA: Diagnosis not present

## 2015-10-25 DIAGNOSIS — L97322 Non-pressure chronic ulcer of left ankle with fat layer exposed: Secondary | ICD-10-CM | POA: Diagnosis not present

## 2015-10-25 DIAGNOSIS — I059 Rheumatic mitral valve disease, unspecified: Secondary | ICD-10-CM

## 2015-10-25 DIAGNOSIS — I83023 Varicose veins of left lower extremity with ulcer of ankle: Secondary | ICD-10-CM | POA: Diagnosis not present

## 2015-10-25 DIAGNOSIS — L97329 Non-pressure chronic ulcer of left ankle with unspecified severity: Secondary | ICD-10-CM | POA: Diagnosis not present

## 2015-10-25 DIAGNOSIS — I080 Rheumatic disorders of both mitral and aortic valves: Secondary | ICD-10-CM

## 2015-10-25 DIAGNOSIS — I4891 Unspecified atrial fibrillation: Secondary | ICD-10-CM

## 2015-10-25 DIAGNOSIS — I10 Essential (primary) hypertension: Secondary | ICD-10-CM

## 2015-10-25 NOTE — Telephone Encounter (Signed)
Pt calling into today to c/o hoarseness and wants to speak to someone about it. Called back to speak with pt - she states she went to see a Dr about 3 months ago about a sore on her leg.  That Dr "picked a scab off the sore" and caused her leg to bleed for a really long time.  She reports she stopped her Eliquis on her own over 3 months ago and is not going to take it again/any more.  She is asking if she should be on something else and if there is anything else for to take.  Advised there are other blood thinners for her to consider however I will need to discuss with Dr Percival Spanish and call her back.  We discussed Coumadin.  Pt is familiar with this because her husband takes it.  Pt is also concerned about her continued hoarseness and sore throat.  She has been seen by [facility] several times for evaluation.  She is now being seen by an MD at [facility] she reports.  She is to have a procedure 7/11 for treatment of it.   Reviewed above information with Dr Percival Spanish.  After review of pt's records he has ordered for the pt to have f/u CBC as ordered by Dr Laurance Flatten.  This was due 5/24 according to his documentation.  Once it has been determined pt's CBC is stable she may be started on Coumadin and followed by [facility]. Reviewed orders with pt.  Order placed for CBC.  Message will be sent to Tammy at the [place] at [facility].

## 2015-10-26 ENCOUNTER — Other Ambulatory Visit: Payer: Medicare Other

## 2015-10-26 DIAGNOSIS — I4891 Unspecified atrial fibrillation: Secondary | ICD-10-CM | POA: Diagnosis not present

## 2015-10-26 LAB — CBC
Hematocrit: 35.5 % (ref 34.0–46.6)
Hemoglobin: 11.2 g/dL (ref 11.1–15.9)
MCH: 28.7 pg (ref 26.6–33.0)
MCHC: 31.5 g/dL (ref 31.5–35.7)
MCV: 91 fL (ref 79–97)
PLATELETS: 289 10*3/uL (ref 150–379)
RBC: 3.9 x10E6/uL (ref 3.77–5.28)
RDW: 15 % (ref 12.3–15.4)
WBC: 6.5 10*3/uL (ref 3.4–10.8)

## 2015-10-26 LAB — PLEASE NOTE

## 2015-10-27 DIAGNOSIS — I341 Nonrheumatic mitral (valve) prolapse: Secondary | ICD-10-CM | POA: Diagnosis not present

## 2015-10-27 DIAGNOSIS — I10 Essential (primary) hypertension: Secondary | ICD-10-CM | POA: Diagnosis not present

## 2015-10-27 DIAGNOSIS — M6281 Muscle weakness (generalized): Secondary | ICD-10-CM | POA: Diagnosis not present

## 2015-10-27 DIAGNOSIS — L97329 Non-pressure chronic ulcer of left ankle with unspecified severity: Secondary | ICD-10-CM | POA: Diagnosis not present

## 2015-10-27 DIAGNOSIS — I872 Venous insufficiency (chronic) (peripheral): Secondary | ICD-10-CM | POA: Diagnosis not present

## 2015-10-27 DIAGNOSIS — Z7901 Long term (current) use of anticoagulants: Secondary | ICD-10-CM | POA: Diagnosis not present

## 2015-10-27 MED ORDER — WARFARIN SODIUM 5 MG PO TABS: 5.0000 mg | ORAL_TABLET | Freq: Every day | ORAL | Status: DC

## 2015-10-27 NOTE — Telephone Encounter (Signed)
CBC is back and H/H is 11.2/35.5.  She is OK to start Coumadin.  Reviewed information with Sharyn Lull at [facility] coumadin clinic.  She instructed pt to start 5 mg a day and come in the INR on Monday 10 am with Tammy.  Attempted to contact pt but husband states pt is out shopping.  Advised I will c/b.

## 2015-10-27 NOTE — Telephone Encounter (Signed)
Spoke with pt who is aware to start Coumadin 5 mg every evening and to be seen in [facility] at [facility] on Monday.

## 2015-10-30 ENCOUNTER — Encounter: Payer: Self-pay | Admitting: Pharmacist

## 2015-10-30 ENCOUNTER — Ambulatory Visit (INDEPENDENT_AMBULATORY_CARE_PROVIDER_SITE_OTHER): Payer: Medicare Other | Admitting: Pharmacist

## 2015-10-30 DIAGNOSIS — I480 Paroxysmal atrial fibrillation: Secondary | ICD-10-CM

## 2015-10-30 DIAGNOSIS — I4819 Other persistent atrial fibrillation: Secondary | ICD-10-CM | POA: Insufficient documentation

## 2015-10-30 HISTORY — DX: Other persistent atrial fibrillation: I48.19

## 2015-10-30 NOTE — Progress Notes (Signed)
Patient has only taken 1 dsoe of warfarin so far.  Checking an INR would not tell much today.  No charge and appt made for 11/01/15

## 2015-10-31 DIAGNOSIS — M6281 Muscle weakness (generalized): Secondary | ICD-10-CM | POA: Diagnosis not present

## 2015-10-31 DIAGNOSIS — I341 Nonrheumatic mitral (valve) prolapse: Secondary | ICD-10-CM | POA: Diagnosis not present

## 2015-10-31 DIAGNOSIS — Z7901 Long term (current) use of anticoagulants: Secondary | ICD-10-CM | POA: Diagnosis not present

## 2015-10-31 DIAGNOSIS — L97321 Non-pressure chronic ulcer of left ankle limited to breakdown of skin: Secondary | ICD-10-CM | POA: Diagnosis not present

## 2015-10-31 DIAGNOSIS — I10 Essential (primary) hypertension: Secondary | ICD-10-CM | POA: Diagnosis not present

## 2015-10-31 DIAGNOSIS — I872 Venous insufficiency (chronic) (peripheral): Secondary | ICD-10-CM | POA: Diagnosis not present

## 2015-11-01 ENCOUNTER — Ambulatory Visit (INDEPENDENT_AMBULATORY_CARE_PROVIDER_SITE_OTHER): Payer: Medicare Other | Admitting: Pharmacist

## 2015-11-01 DIAGNOSIS — I480 Paroxysmal atrial fibrillation: Secondary | ICD-10-CM

## 2015-11-01 LAB — COAGUCHEK XS/INR WAIVED
INR: 1.6 — ABNORMAL HIGH (ref 0.9–1.1)
Prothrombin Time: 19.6 s

## 2015-11-01 NOTE — Patient Instructions (Signed)
Anticoagulation Dose Instructions as of 11/01/2015      Dorene Grebe Tue Wed Thu Fri Sat   New Dose 5 mg 2.5 mg 5 mg 5 mg 5 mg 2.5 mg 5 mg    Description        Decrease dose to warfarin 5mg  - take 1/2 tablet on Mondays and Fridays.  Take 1 tablet all other days.     INR was 1.6 today (a little thick)

## 2015-11-08 ENCOUNTER — Ambulatory Visit (INDEPENDENT_AMBULATORY_CARE_PROVIDER_SITE_OTHER): Payer: Medicare Other | Admitting: Pharmacist

## 2015-11-08 DIAGNOSIS — I872 Venous insufficiency (chronic) (peripheral): Secondary | ICD-10-CM | POA: Diagnosis not present

## 2015-11-08 DIAGNOSIS — I480 Paroxysmal atrial fibrillation: Secondary | ICD-10-CM | POA: Diagnosis not present

## 2015-11-08 DIAGNOSIS — M6281 Muscle weakness (generalized): Secondary | ICD-10-CM | POA: Diagnosis not present

## 2015-11-08 DIAGNOSIS — Z7901 Long term (current) use of anticoagulants: Secondary | ICD-10-CM | POA: Diagnosis not present

## 2015-11-08 DIAGNOSIS — L97321 Non-pressure chronic ulcer of left ankle limited to breakdown of skin: Secondary | ICD-10-CM | POA: Diagnosis not present

## 2015-11-08 DIAGNOSIS — I341 Nonrheumatic mitral (valve) prolapse: Secondary | ICD-10-CM | POA: Diagnosis not present

## 2015-11-08 DIAGNOSIS — I10 Essential (primary) hypertension: Secondary | ICD-10-CM | POA: Diagnosis not present

## 2015-11-08 LAB — COAGUCHEK XS/INR WAIVED
INR: 3.3 — ABNORMAL HIGH (ref 0.9–1.1)
Prothrombin Time: 39.9 s

## 2015-11-08 NOTE — Patient Instructions (Signed)
Anticoagulation Dose Instructions as of 11/08/2015      Patricia Villa Tue Wed Thu Fri Sat   New Dose 2.5 mg 5 mg 2.5 mg 5 mg 2.5 mg 5 mg 2.5 mg    Description        No warfarin today - July 5th.  Then decrease warfarin dose to 1/2 tablet daily except 1 tablet mondays, wednesdays and fridays.     INR was 3.3 today (a little too thin - goal is 2.0 to 3.0)

## 2015-11-13 ENCOUNTER — Ambulatory Visit (INDEPENDENT_AMBULATORY_CARE_PROVIDER_SITE_OTHER): Payer: Medicare Other | Admitting: Pharmacist

## 2015-11-13 DIAGNOSIS — I480 Paroxysmal atrial fibrillation: Secondary | ICD-10-CM | POA: Diagnosis not present

## 2015-11-13 LAB — COAGUCHEK XS/INR WAIVED
INR: 1.9 — ABNORMAL HIGH (ref 0.9–1.1)
PROTHROMBIN TIME: 22.7 s

## 2015-11-13 NOTE — Patient Instructions (Signed)
Anticoagulation Dose Instructions as of 11/13/2015      Patricia Villa Tue Wed Thu Fri Sat   New Dose 2.5 mg 5 mg 2.5 mg 5 mg 2.5 mg 5 mg 2.5 mg    Description        Continue current warfarin dose to 1/2 tablet daily except 1 tablet mondays, wednesdays and fridays.     INR was 1.9 today

## 2015-11-13 NOTE — Progress Notes (Signed)
Subjective:     Indication: atrial fibrillation Bleeding signs/symptoms: old bruise on right buttock / green and purple about 4 inches long and 2 inches wide. Thromboembolic signs/symptoms: None  Missed Coumadin doses: This week - held for 1 day because INR was 3.3 last week Medication changes: no Dietary changes: no Bacterial/viral infection: no Other concerns: yes - Patricia Villa received call from Home health that they were concerned about patient falling and taking warfarin. They are currently going to her home for wound care I asked patient if she was receiving PT with home health.  She states that when they first starting coming out about 1 -2 months ago they did an assessment and she was told that she did not qualify for PT.   Patient states she has not fallen since she started warfarin.   HAS BLED = 4.1% CHADs Score = 4  Objective:    INR Today: 1.9 Current dose: Warfarin 5mg  - takes 1 tablet MWF and 1/2 tablet all other days.    Assessment:    Subtherapeutic INR for goal of 2-3   Plan:    1. New dose: no change  (patient is having procedure / injection into larynx tomorrow) 2. Next INR: 1 week   3.  Referral for home health to do fall assessment and treat with PT as needed.  4.  Patient has walker at home - advise to use it for ambulation 5.  Had discussion with patient regarding risk of falls and bleeding risk vs risk of stroke / clotting from atrial fibrillation.  At this time she wishes to continue warfarin.    Notes forwarded to her PCP and cardiologist.

## 2015-11-14 ENCOUNTER — Other Ambulatory Visit (HOSPITAL_COMMUNITY): Payer: Medicare Other

## 2015-11-14 ENCOUNTER — Telehealth: Payer: Self-pay | Admitting: Cardiology

## 2015-11-14 DIAGNOSIS — J3801 Paralysis of vocal cords and larynx, unilateral: Secondary | ICD-10-CM | POA: Diagnosis not present

## 2015-11-16 DIAGNOSIS — I872 Venous insufficiency (chronic) (peripheral): Secondary | ICD-10-CM | POA: Diagnosis not present

## 2015-11-16 DIAGNOSIS — I341 Nonrheumatic mitral (valve) prolapse: Secondary | ICD-10-CM | POA: Diagnosis not present

## 2015-11-16 DIAGNOSIS — I10 Essential (primary) hypertension: Secondary | ICD-10-CM | POA: Diagnosis not present

## 2015-11-16 DIAGNOSIS — L97321 Non-pressure chronic ulcer of left ankle limited to breakdown of skin: Secondary | ICD-10-CM | POA: Diagnosis not present

## 2015-11-16 DIAGNOSIS — Z7901 Long term (current) use of anticoagulants: Secondary | ICD-10-CM | POA: Diagnosis not present

## 2015-11-16 DIAGNOSIS — M6281 Muscle weakness (generalized): Secondary | ICD-10-CM | POA: Diagnosis not present

## 2015-11-16 NOTE — Telephone Encounter (Signed)
Closed encounter °

## 2015-11-17 ENCOUNTER — Telehealth: Payer: Self-pay | Admitting: Cardiology

## 2015-11-17 NOTE — Telephone Encounter (Signed)
New message      The pt is calling concerns she is having surgery, on August 3 rd and the pt wants to see if she can be put to sleep,no other information provided

## 2015-11-17 NOTE — Telephone Encounter (Signed)
Patient is having throat surgery on August 3rd Dr. Ria Comment L. Madden @ [facility] is doing surgery  Patient states she has tried the procedure w/o having anesthesia and she "couldn't stand it" - this was done in the office She will need to be put to sleep for this procedure - wants to know if this is OK with Dr. Percival Spanish  Patient is unsure if she will need to hold warfarin  Phone: [phone] (surgical scheduling)   Phone: [phone] (per patient Dr. Hazle Coca office)  Message routed to MD & Hebert Soho, [place]

## 2015-11-18 NOTE — Telephone Encounter (Signed)
She has no contraindication to surgery from a cardiac standpoint.  She can hold the warfarin if necessary for the procedure.  No bridging would be indicated.

## 2015-11-20 ENCOUNTER — Ambulatory Visit (INDEPENDENT_AMBULATORY_CARE_PROVIDER_SITE_OTHER): Payer: Medicare Other

## 2015-11-20 ENCOUNTER — Encounter: Payer: Self-pay | Admitting: Family Medicine

## 2015-11-20 ENCOUNTER — Other Ambulatory Visit: Payer: Self-pay | Admitting: *Deleted

## 2015-11-20 ENCOUNTER — Ambulatory Visit (INDEPENDENT_AMBULATORY_CARE_PROVIDER_SITE_OTHER): Payer: Medicare Other | Admitting: Family Medicine

## 2015-11-20 ENCOUNTER — Telehealth: Payer: Self-pay | Admitting: Family Medicine

## 2015-11-20 VITALS — BP 113/74 | HR 74 | Temp 97.0°F | Ht 64.0 in | Wt 134.0 lb

## 2015-11-20 DIAGNOSIS — E559 Vitamin D deficiency, unspecified: Secondary | ICD-10-CM

## 2015-11-20 DIAGNOSIS — R0781 Pleurodynia: Secondary | ICD-10-CM | POA: Diagnosis not present

## 2015-11-20 DIAGNOSIS — J3800 Paralysis of vocal cords and larynx, unspecified: Secondary | ICD-10-CM

## 2015-11-20 DIAGNOSIS — R296 Repeated falls: Secondary | ICD-10-CM

## 2015-11-20 DIAGNOSIS — R413 Other amnesia: Secondary | ICD-10-CM

## 2015-11-20 DIAGNOSIS — I10 Essential (primary) hypertension: Secondary | ICD-10-CM | POA: Diagnosis not present

## 2015-11-20 DIAGNOSIS — I480 Paroxysmal atrial fibrillation: Secondary | ICD-10-CM

## 2015-11-20 DIAGNOSIS — E785 Hyperlipidemia, unspecified: Secondary | ICD-10-CM

## 2015-11-20 DIAGNOSIS — R2689 Other abnormalities of gait and mobility: Secondary | ICD-10-CM | POA: Diagnosis not present

## 2015-11-20 DIAGNOSIS — I700 Atherosclerosis of aorta: Secondary | ICD-10-CM | POA: Insufficient documentation

## 2015-11-20 DIAGNOSIS — M549 Dorsalgia, unspecified: Secondary | ICD-10-CM

## 2015-11-20 DIAGNOSIS — F411 Generalized anxiety disorder: Secondary | ICD-10-CM

## 2015-11-20 DIAGNOSIS — R0789 Other chest pain: Secondary | ICD-10-CM

## 2015-11-20 LAB — URINALYSIS, COMPLETE
BILIRUBIN UA: NEGATIVE
Glucose, UA: NEGATIVE
KETONES UA: NEGATIVE
NITRITE UA: POSITIVE — AB
Protein, UA: NEGATIVE
RBC UA: NEGATIVE
SPEC GRAV UA: 1.025 (ref 1.005–1.030)
Urobilinogen, Ur: 0.2 mg/dL (ref 0.2–1.0)
pH, UA: 6 (ref 5.0–7.5)

## 2015-11-20 LAB — MICROSCOPIC EXAMINATION
RBC MICROSCOPIC, UA: NONE SEEN /HPF (ref 0–?)
WBC, UA: >30 /hpf — AB (ref 0–?)

## 2015-11-20 LAB — COAGUCHEK XS/INR WAIVED
INR: 2.2 — AB (ref 0.9–1.1)
PROTHROMBIN TIME: 26.2 s

## 2015-11-20 MED ORDER — METOPROLOL SUCCINATE ER 25 MG PO TB24: 12.5000 mg | ORAL_TABLET | Freq: Every day | ORAL | Status: DC

## 2015-11-20 MED ORDER — AMLODIPINE BESYLATE 2.5 MG PO TABS: 2.5000 mg | ORAL_TABLET | Freq: Every day | ORAL | Status: DC

## 2015-11-20 MED ORDER — DULOXETINE HCL 60 MG PO CPEP: 60.0000 mg | ORAL_CAPSULE | Freq: Every day | ORAL | Status: DC

## 2015-11-20 MED ORDER — LISINOPRIL 40 MG PO TABS: 40.0000 mg | ORAL_TABLET | Freq: Every day | ORAL | Status: DC

## 2015-11-20 MED ORDER — CIPROFLOXACIN HCL 500 MG PO TABS: 500.0000 mg | ORAL_TABLET | Freq: Two times a day (BID) | ORAL | Status: DC

## 2015-11-20 MED ORDER — WARFARIN SODIUM 5 MG PO TABS: 5.0000 mg | ORAL_TABLET | Freq: Every day | ORAL | Status: DC

## 2015-11-20 MED ORDER — FUROSEMIDE 20 MG PO TABS: 20.0000 mg | ORAL_TABLET | Freq: Every day | ORAL | Status: DC

## 2015-11-20 NOTE — Addendum Note (Signed)
Addended by: Zannie Cove on: 11/20/2015 05:52 PM   Modules accepted: Orders

## 2015-11-20 NOTE — Telephone Encounter (Signed)
Question answered. 

## 2015-11-20 NOTE — Telephone Encounter (Signed)
Clearance faxed to Dr Rowe Clack via epic and faxed machine.Marland Kitchen

## 2015-11-20 NOTE — Addendum Note (Signed)
Addended by: Zannie Cove on: 11/20/2015 12:51 PM   Modules accepted: Orders

## 2015-11-20 NOTE — Patient Instructions (Addendum)
Anticoagulation Dose Instructions as of 11/20/2015      Dorene Grebe Tue Wed Thu Fri Sat   New Dose 2.5 mg 5 mg 2.5 mg 5 mg 2.5 mg 5 mg 2.5 mg    Description        Continue current warfarin dose to 1/2 tablet daily except 1 tablet mondays, wednesdays and fridays.     INR was 2.2 today                       Medicare Annual Wellness Visit  [facility] and the medical providers at [facility] strive to bring you the best medical care.  In doing so we not only want to address your current medical conditions and concerns but also to detect new conditions early and prevent illness, disease and health-related problems.    Medicare offers a yearly Wellness Visit which allows our clinical staff to assess your need for preventative services including immunizations, lifestyle education, counseling to decrease risk of preventable diseases and screening for fall risk and other medical concerns.    This visit is provided free of charge (no copay) for all Medicare recipients. The clinical pharmacists at [facility] have begun to conduct these Wellness Visits which will also include a thorough review of all your medications.    As you primary medical provider recommend that you make an appointment for your Annual Wellness Visit if you have not done so already this year.  You may set up this appointment before you leave today or you may call back [phone] and schedule an appointment.  Please make sure when you call that you mention that you are scheduling your Annual Wellness Visit with the clinical pharmacist so that the appointment may be made for the proper length of time.    Continue current medications. Continue good therapeutic lifestyle changes which include good diet and exercise. Fall precautions discussed with patient. If an FOBT was given today- please return it to our front desk. If you are over 19 years old - you may need Prevnar 48 or the adult  Pneumonia vaccine.  After your visit with [place] today you will receive a survey in the mail or online from [employer] regarding your care with [place]. Please take a moment to fill this out. Your feedback is very important to [place] as you can help [place] better understand your patient needs as well as improve your experience and satisfaction. WE CARE ABOUT YOU!!!    We will arrange for an appointment with the neurologist because of the frequent falls and increased confusion We will make sure that she keeps her appointment with the cardiologist for approval for the vocal cord procedure and because of the frequent falls and we will ask him to determine the safety  of her continuing to take the Coumadin She should follow up with ENT as planned We will arrange for her to have a CT scan of her head because of the increase following sentence the MRI in May We are encouraging her to allow help to come in the home until a lot of these issues are settled and the daughter-in-law was made aware of this as she was present during the visit

## 2015-11-20 NOTE — Progress Notes (Signed)
Subjective:    Patient ID: Patricia Villa, female    DOB: 01/24/1933, 82 y.o.   MRN: [id number]  HPI Pt here for follow up and management of chronic medical problems which includes a fib, hyperlipidemia, and hypertension. She is taking medications regularly.The patient is having some increased issues with forgetfulness and memory. A note was sent by her son that she is getting short tempered and showing aggressive behavior especially if you say the wrong thing to her. There is also some concern that she may be taking her sister's medicine for dementia. This is a note from her son Annalee Genta. This will be scanned into the record. Since a fall about one month ago she's been having more cramping in her mid back area has been using a heating pad. She is requesting several medication refills. Her INR today is good. The patient denies any chest pain. She does have occasional fullness in her abdomen which is relieved by taking Tums. She denies any additional shortness of breath. She has no trouble with heartburn indigestion nausea vomiting diarrhea or blood in the stool. She is passing her water as usual and takes fluid pills which is expected for her to go to the bathroom more frequently. She has had some recent falls and she is not sure why she has had the falls. She does seem to be more confused today than is usual. She has been to see the ear nose and throat specialist for her hoarseness and she has a paralyzed vocal cord. She did not tolerate the in office procedure and will be scheduled to return for an overnight visit once the cardiologist approves this. The biggest concern regarding the patient comes from her family and her falls. She did have an MRI of her head because of a meningioma in May and everything was stable. The falls have occurred more recently. She does have an appointment with the cardiologist soon because of the ear nose and throat procedure that is being planned and we will ask him to also  review a reason behind the following and if it could be cardiac in nature. Will also want him to address whether she should stay on Coumadin or not.     Patient Active Problem List   Diagnosis Date Noted  . Paroxysmal atrial fibrillation ([place]) 10/30/2015  . Atrial fibrillation ([place]) 11/23/2014  . Osteoporosis 08/31/2014  . Chronic neck pain 06/07/2014  . DDD (degenerative disc disease), cervical 01/24/2014  . Hyperlipidemia 05/17/2013  . Colitis 01/20/2013  . Generalized anxiety disorder 08/05/2012  . Meningioma ([place]) 08/05/2012  . Bruit 10/17/2010  . Essential hypertension, benign 10/04/2009  . MITRAL INSUFFICIENCY 09/26/2009  . Mitral valve disorder 09/26/2009  . Hypertrophic obstructive cardiomyopathy ([place]) 09/26/2009   Outpatient Encounter Prescriptions as of 11/20/2015  Medication Sig  . amLODipine (NORVASC) 2.5 MG tablet Take 1 tablet (2.5 mg total) by mouth daily.  . Cholecalciferol (VITAMIN D3) 2000 UNITS TABS Take 2,000 Units by mouth daily.  . clobetasol cream (TEMOVATE) 0.05 % Apply topically 2 (two) times daily.  . DULoxetine (CYMBALTA) 60 MG capsule Take 1 capsule (60 mg total) by mouth daily.  . furosemide (LASIX) 20 MG tablet Take 1 tablet (20 mg total) by mouth daily.  Marland Kitchen lisinopril (PRINIVIL,ZESTRIL) 40 MG tablet Take 1 tablet (40 mg total) by mouth daily.  . metoprolol succinate (TOPROL-XL) 25 MG 24 hr tablet Take 0.5 tablets (12.5 mg total) by mouth daily.  . polyethylene glycol (MIRALAX / GLYCOLAX) packet Take 17  g by mouth daily as needed (for constipation.).  Marland Kitchen warfarin (COUMADIN) 5 MG tablet Take 1 tablet (5 mg total) by mouth daily.  . [DISCONTINUED] fluconazole (DIFLUCAN) 100 MG tablet Take 1 tablet (100 mg total) by mouth daily.   No facility-administered encounter medications on file as of 11/20/2015.      Review of Systems  Constitutional: Negative.   HENT: Negative.   Eyes: Negative.   Respiratory: Negative.   Cardiovascular: Negative.     Gastrointestinal: Negative.   Endocrine: Negative.   Genitourinary: Negative.   Musculoskeletal: Positive for arthralgias (cramping in mid back - right rib pain -- had a fall 1 month ago ).  Skin: Negative.   Allergic/Immunologic: Negative.   Neurological: Negative.   Hematological: Negative.   Psychiatric/Behavioral: Positive for confusion (memory decreased / forgetful).       Objective:   Physical Exam  Constitutional: She is oriented to person, place, and time. She appears well-developed. No distress.  The patient is thin but has a healthy body appearance. She comes to the visit today with her daughter-in-law .  HENT:  Head: Normocephalic and atraumatic.  Right Ear: External ear normal.  Left Ear: External ear normal.  Nose: Nose normal.  Mouth/Throat: Oropharynx is clear and moist.  Patient has a voice that is hoarse  Eyes: Conjunctivae and EOM are normal. Pupils are equal, round, and reactive to light. Right eye exhibits no discharge. Left eye exhibits no discharge. No scleral icterus.  Neck: Normal range of motion. Neck supple. No thyromegaly present.  No bruits or thyromegaly and no adenopathy  Cardiovascular: Normal rate, normal heart sounds and intact distal pulses.   No murmur heard. The pulse was good on the right foot. Heart was irregular irregular at about 72/m  Pulmonary/Chest: Effort normal and breath sounds normal. No respiratory distress. She has no wheezes. She has no rales. She exhibits tenderness.  There was some slight tenderness in the right lateral posterior rib cage and the spine area in the thoracic region. Clear  anteriorly and posteriorly  Abdominal: Soft. Bowel sounds are normal. She exhibits no mass. There is no tenderness. There is no rebound and no guarding.  Slight suprapubic tenderness  Musculoskeletal: She exhibits no edema or tenderness.  Somewhat hesitant range of motion  Lymphadenopathy:    She has no cervical adenopathy.  Neurological: She  is alert and oriented to person, place, and time.  Skin: Skin is warm and dry. No rash noted.  Psychiatric: She has a normal mood and affect. Her behavior is normal. Judgment and thought content normal.  The patient seemed to have somewhat decreased memory than in the past.  Nursing note and vitals reviewed.   BP 113/74 mmHg  Pulse 74  Temp(Src) 97 F (36.1 C) (Oral)  Ht 5' 4"  (1.626 m)  Wt 134 lb (60.782 kg)  BMI 22.99 kg/m2  LMP 08/01/1982       Assessment & Plan:  1. Paroxysmal atrial fibrillation ([place]) -Follow-up with cardiology as planned - CoaguChek XS/INR Waived - CBC with Differential/Platelet  2. Essential hypertension, benign -The blood pressure is good today and she will continue with current treatment - BMP8+EGFR - CBC with Differential/Platelet - Hepatic function panel  3. Hyperlipidemia -Continue with aggressive therapeutic lifestyle changes pending results of lab work - CBC with Differential/Platelet - Lipid panel  4. Vitamin D deficiency -Continue with current treatment pending results of lab work - CBC with Differential/Platelet - VITAMIN D 25 Hydroxy (Vit-D Deficiency, Fractures)  5.  Generalized anxiety disorder -Reassured her and provide more supervision at home until some of the healthcare issues are worked out - CBC with Differential/Platelet  6. Mid back pain -Tylenol for pain - DG Thoracic Spine 2 View; Future - Urinalysis, Complete  7. Frequent falls -Consider discontinuing Coumadin -Appointments will be arranged with neurology and cardiology to further evaluate this - Vitamin B12 - Thyroid Panel With TSH - CT Head Wo Contrast; Future - Ambulatory referral to Neurology - DG Ribs Unilateral Right; Future  8. Memory deficit - CT Head Wo Contrast; Future - Ambulatory referral to Neurology - Urinalysis, Complete  9. Rib pain on right side - DG Ribs Unilateral Right; Future  10. Vocal cord paralysis -Follow up with ENT  Meds  ordered this encounter  Medications  . amLODipine (NORVASC) 2.5 MG tablet    Sig: Take 1 tablet (2.5 mg total) by mouth daily.    Dispense:  90 tablet    Refill:  3  . DULoxetine (CYMBALTA) 60 MG capsule    Sig: Take 1 capsule (60 mg total) by mouth daily.    Dispense:  90 capsule    Refill:  3  . furosemide (LASIX) 20 MG tablet    Sig: Take 1 tablet (20 mg total) by mouth daily.    Dispense:  90 tablet    Refill:  3  . lisinopril (PRINIVIL,ZESTRIL) 40 MG tablet    Sig: Take 1 tablet (40 mg total) by mouth daily.    Dispense:  90 tablet    Refill:  3  . metoprolol succinate (TOPROL-XL) 25 MG 24 hr tablet    Sig: Take 0.5 tablets (12.5 mg total) by mouth daily.    Dispense:  45 tablet    Refill:  3  . warfarin (COUMADIN) 5 MG tablet    Sig: Take 1 tablet (5 mg total) by mouth daily.    Dispense:  30 tablet    Refill:  6   Patient Instructions   Anticoagulation Dose Instructions as of 11/20/2015      Dorene Grebe Tue Wed Thu Fri Sat   New Dose 2.5 mg 5 mg 2.5 mg 5 mg 2.5 mg 5 mg 2.5 mg    Description        Continue current warfarin dose to 1/2 tablet daily except 1 tablet mondays, wednesdays and fridays.     INR was 2.2 today                       Medicare Annual Wellness Visit  [facility] and the medical providers at [facility] strive to bring you the best medical care.  In doing so we not only want to address your current medical conditions and concerns but also to detect new conditions early and prevent illness, disease and health-related problems.    Medicare offers a yearly Wellness Visit which allows our clinical staff to assess your need for preventative services including immunizations, lifestyle education, counseling to decrease risk of preventable diseases and screening for fall risk and other medical concerns.    This visit is provided free of charge (no copay) for all Medicare recipients. The clinical pharmacists at [facility] have begun to conduct these Wellness Visits which will also include a thorough review of all your medications.    As you primary medical provider recommend that you make an appointment for your Annual Wellness Visit if you have not done so already this year.  You may set up this appointment before you leave today or you may call back ([phone]) and schedule an appointment.  Please make sure when you call that you mention that you are scheduling your Annual Wellness Visit with the clinical pharmacist so that the appointment may be made for the proper length of time.    Continue current medications. Continue good therapeutic lifestyle changes which include good diet and exercise. Fall precautions discussed with patient. If an FOBT was given today- please return it to our front desk. If you are over 57 years old - you may need Prevnar 65 or the adult Pneumonia vaccine.  After your visit with [place] today you will receive a survey in the mail or online from [employer] regarding your care with [place]. Please take a moment to fill this out. Your feedback is very important to [place] as you can help [place] better understand your patient needs as well as improve your experience and satisfaction. WE CARE ABOUT YOU!!!    We will arrange for an appointment with the neurologist because of the frequent falls and increased confusion We will make sure that she keeps her appointment with the cardiologist for approval for the vocal cord procedure and because of the frequent falls and we will ask him to determine the safety  of her continuing to take the Coumadin She should follow up with ENT as planned We will arrange for her to have a CT scan of her head because of the increase following sentence the MRI in May We are encouraging her to allow help to come in the home until a lot of these issues are settled and the daughter-in-law was made aware of this as she was present during the visit     Arrie Senate MD

## 2015-11-21 ENCOUNTER — Other Ambulatory Visit: Payer: Self-pay

## 2015-11-21 ENCOUNTER — Ambulatory Visit (HOSPITAL_COMMUNITY): Payer: Medicare Other | Attending: Internal Medicine

## 2015-11-21 ENCOUNTER — Encounter: Payer: Self-pay | Admitting: *Deleted

## 2015-11-21 DIAGNOSIS — I358 Other nonrheumatic aortic valve disorders: Secondary | ICD-10-CM | POA: Diagnosis not present

## 2015-11-21 DIAGNOSIS — I509 Heart failure, unspecified: Secondary | ICD-10-CM | POA: Insufficient documentation

## 2015-11-21 DIAGNOSIS — I371 Nonrheumatic pulmonary valve insufficiency: Secondary | ICD-10-CM | POA: Diagnosis not present

## 2015-11-21 DIAGNOSIS — I340 Nonrheumatic mitral (valve) insufficiency: Secondary | ICD-10-CM | POA: Diagnosis not present

## 2015-11-21 DIAGNOSIS — I071 Rheumatic tricuspid insufficiency: Secondary | ICD-10-CM | POA: Diagnosis not present

## 2015-11-21 DIAGNOSIS — I351 Nonrheumatic aortic (valve) insufficiency: Secondary | ICD-10-CM | POA: Insufficient documentation

## 2015-11-21 DIAGNOSIS — I119 Hypertensive heart disease without heart failure: Secondary | ICD-10-CM | POA: Diagnosis not present

## 2015-11-21 DIAGNOSIS — I341 Nonrheumatic mitral (valve) prolapse: Secondary | ICD-10-CM | POA: Diagnosis not present

## 2015-11-21 DIAGNOSIS — I059 Rheumatic mitral valve disease, unspecified: Secondary | ICD-10-CM | POA: Diagnosis not present

## 2015-11-21 LAB — CBC WITH DIFFERENTIAL/PLATELET
BASOS ABS: 0 10*3/uL (ref 0.0–0.2)
BASOS: 1 %
EOS (ABSOLUTE): 0.2 10*3/uL (ref 0.0–0.4)
EOS: 3 %
HEMATOCRIT: 35.7 % (ref 34.0–46.6)
HEMOGLOBIN: 11 g/dL — AB (ref 11.1–15.9)
Immature Grans (Abs): 0 10*3/uL (ref 0.0–0.1)
Immature Granulocytes: 0 %
LYMPHS ABS: 2 10*3/uL (ref 0.7–3.1)
Lymphs: 34 %
MCH: 28.1 pg (ref 26.6–33.0)
MCHC: 30.8 g/dL — ABNORMAL LOW (ref 31.5–35.7)
MCV: 91 fL (ref 79–97)
MONOCYTES: 12 %
Monocytes Absolute: 0.7 10*3/uL (ref 0.1–0.9)
NEUTROS ABS: 3 10*3/uL (ref 1.4–7.0)
Neutrophils: 50 %
Platelets: 255 10*3/uL (ref 150–379)
RBC: 3.92 x10E6/uL (ref 3.77–5.28)
RDW: 15.9 % — ABNORMAL HIGH (ref 12.3–15.4)
WBC: 5.9 10*3/uL (ref 3.4–10.8)

## 2015-11-21 LAB — LIPID PANEL
CHOL/HDL RATIO: 3 ratio (ref 0.0–4.4)
Cholesterol, Total: 153 mg/dL (ref 100–199)
HDL: 51 mg/dL (ref >39)
LDL CALC: 88 mg/dL (ref 0–99)
Triglycerides: 70 mg/dL (ref 0–149)
VLDL Cholesterol Cal: 14 mg/dL (ref 5–40)

## 2015-11-21 LAB — BMP8+EGFR
BUN/Creatinine Ratio: 34 — ABNORMAL HIGH (ref 12–28)
BUN: 26 mg/dL (ref 8–27)
CALCIUM: 9.3 mg/dL (ref 8.7–10.3)
CHLORIDE: 100 mmol/L (ref 96–106)
CO2: 23 mmol/L (ref 18–29)
Creatinine, Ser: 0.76 mg/dL (ref 0.57–1.00)
GFR calc non Af Amer: 74 mL/min/{1.73_m2} (ref >59)
GFR, EST AFRICAN AMERICAN: 86 mL/min/{1.73_m2} (ref >59)
GLUCOSE: 89 mg/dL (ref 65–99)
POTASSIUM: 4.3 mmol/L (ref 3.5–5.2)
Sodium: 140 mmol/L (ref 134–144)

## 2015-11-21 LAB — HEPATIC FUNCTION PANEL
ALBUMIN: 4.3 g/dL (ref 3.5–4.7)
ALK PHOS: 60 IU/L (ref 39–117)
ALT: 15 IU/L (ref 0–32)
AST: 24 IU/L (ref 0–40)
BILIRUBIN TOTAL: 0.8 mg/dL (ref 0.0–1.2)
BILIRUBIN, DIRECT: 0.22 mg/dL (ref 0.00–0.40)
Total Protein: 7.1 g/dL (ref 6.0–8.5)

## 2015-11-21 LAB — VITAMIN D 25 HYDROXY (VIT D DEFICIENCY, FRACTURES): Vit D, 25-Hydroxy: 48.4 ng/mL (ref 30.0–100.0)

## 2015-11-21 NOTE — Progress Notes (Signed)
Tammy, Just FYI DWM started pt on Cipro 500 BID x 10 days yesterday.  Coumadin may need to be adjusted?

## 2015-11-22 DIAGNOSIS — L97321 Non-pressure chronic ulcer of left ankle limited to breakdown of skin: Secondary | ICD-10-CM | POA: Diagnosis not present

## 2015-11-22 DIAGNOSIS — I341 Nonrheumatic mitral (valve) prolapse: Secondary | ICD-10-CM | POA: Diagnosis not present

## 2015-11-22 DIAGNOSIS — I872 Venous insufficiency (chronic) (peripheral): Secondary | ICD-10-CM | POA: Diagnosis not present

## 2015-11-22 DIAGNOSIS — M6281 Muscle weakness (generalized): Secondary | ICD-10-CM | POA: Diagnosis not present

## 2015-11-22 DIAGNOSIS — Z7901 Long term (current) use of anticoagulants: Secondary | ICD-10-CM | POA: Diagnosis not present

## 2015-11-22 DIAGNOSIS — I10 Essential (primary) hypertension: Secondary | ICD-10-CM | POA: Diagnosis not present

## 2015-11-23 DIAGNOSIS — M6281 Muscle weakness (generalized): Secondary | ICD-10-CM | POA: Diagnosis not present

## 2015-11-23 DIAGNOSIS — L97321 Non-pressure chronic ulcer of left ankle limited to breakdown of skin: Secondary | ICD-10-CM | POA: Diagnosis not present

## 2015-11-23 DIAGNOSIS — I872 Venous insufficiency (chronic) (peripheral): Secondary | ICD-10-CM | POA: Diagnosis not present

## 2015-11-23 DIAGNOSIS — I341 Nonrheumatic mitral (valve) prolapse: Secondary | ICD-10-CM | POA: Diagnosis not present

## 2015-11-23 DIAGNOSIS — I10 Essential (primary) hypertension: Secondary | ICD-10-CM | POA: Diagnosis not present

## 2015-11-23 DIAGNOSIS — Z7901 Long term (current) use of anticoagulants: Secondary | ICD-10-CM | POA: Diagnosis not present

## 2015-11-23 LAB — THYROID PANEL WITH TSH
Free Thyroxine Index: 2.3 (ref 1.2–4.9)
T3 UPTAKE RATIO: 33 % (ref 24–39)
T4, Total: 7.1 ug/dL (ref 4.5–12.0)
TSH: 0.857 u[IU]/mL (ref 0.450–4.500)

## 2015-11-23 LAB — URINE CULTURE

## 2015-11-23 LAB — VITAMIN B12: Vitamin B-12: 450 pg/mL (ref 211–946)

## 2015-11-23 LAB — SPECIMEN STATUS REPORT

## 2015-11-23 NOTE — Progress Notes (Signed)
Patient ID: Patricia Villa, female   DOB: 1932-10-07, 82 y.o.   MRN: [id number] Patient given appt to check INR 11/29/2015

## 2015-11-24 ENCOUNTER — Telehealth: Payer: Self-pay | Admitting: Family Medicine

## 2015-11-24 ENCOUNTER — Other Ambulatory Visit: Payer: Self-pay | Admitting: *Deleted

## 2015-11-24 MED ORDER — DULOXETINE HCL 30 MG PO CPEP: 30.0000 mg | ORAL_CAPSULE | Freq: Every day | ORAL | Status: DC

## 2015-11-24 MED ORDER — AMOXICILLIN-POT CLAVULANATE 875-125 MG PO TABS: 1.0000 | ORAL_TABLET | Freq: Two times a day (BID) | ORAL | Status: DC

## 2015-11-24 NOTE — Telephone Encounter (Signed)
Pt son called = phillip == aware of all labs and xrays and urine

## 2015-11-24 NOTE — Telephone Encounter (Signed)
Please review and advise.

## 2015-11-25 NOTE — Telephone Encounter (Signed)
Call in generic Cymbalta 30 mg to [facility] with refills 1 daily

## 2015-11-26 NOTE — Progress Notes (Addendum)
HPI The patient presents for followup of MR and atrial fib.  I sent her for an echocardiogram to follow up MR.  This was more severe than previous with an apparent partially flail anterior leaflet with moderate to severe eccentric MR.   There was also moderately severe TR with elevated pulmonary pressures.     She returns for follow up prior to having throat surgery.  She's going to have a procedure to treat vocal cord paralysis  This was attempted in the office previously but the pain was prohibitive.  Apparently she's planning a procedure in the hospital with anesthesia.She's having trouble it seems with secretions and possibly aspiration.  She feels like fluid collects and she has a difficult time swallowing.  She does have some DOE with exertion and does have some increased fatigue and increased somnolence.  The patient denies any new symptoms such as chest discomfort, neck or arm discomfort. There has been no new shortness of breath, PND or orthopnea. There have been no reported palpitations, presyncope or syncope.   Allergies  Allergen Reactions  . Actonel [Risedronate Sodium] Other (See Comments)    Esophagitis   . Diphenhydramine Hcl Hives  . Fosamax [Alendronate Sodium] Other (See Comments)    Esophagitis   . Livalo [Pitavastatin Calcium] Other (See Comments)    Pt doesn't remember reaction  . Pravastatin Other (See Comments)    Extreme leg cramping.  . Sulfa Antibiotics Other (See Comments)    Pt doesn't remember reaction  . Zocor [Simvastatin] Other (See Comments)    Pt doesn't remember reaction    Current Outpatient Prescriptions  Medication Sig Dispense Refill  . amLODipine (NORVASC) 2.5 MG tablet Take 1 tablet (2.5 mg total) by mouth daily. 90 tablet 3  . amoxicillin-clavulanate (AUGMENTIN) 875-125 MG tablet Take 1 tablet by mouth 2 (two) times daily. 20 tablet 0  . Cholecalciferol (VITAMIN D3) 2000 UNITS TABS Take 2,000 Units by mouth daily.    . clobetasol cream  (TEMOVATE) 0.05 % Apply topically 2 (two) times daily. 30 g 0  . DULoxetine (CYMBALTA) 30 MG capsule Take 1 capsule (30 mg total) by mouth daily. 30 capsule 2  . furosemide (LASIX) 20 MG tablet Take 1 tablet (20 mg total) by mouth daily. 90 tablet 3  . lisinopril (PRINIVIL,ZESTRIL) 40 MG tablet Take 1 tablet (40 mg total) by mouth daily. 90 tablet 3  . metoprolol succinate (TOPROL-XL) 25 MG 24 hr tablet Take 0.5 tablets (12.5 mg total) by mouth daily. 45 tablet 3  . polyethylene glycol (MIRALAX / GLYCOLAX) packet Take 17 g by mouth daily as needed (for constipation.).    Marland Kitchen warfarin (COUMADIN) 5 MG tablet Take 1 tablet (5 mg total) by mouth daily. 30 tablet 6   No current facility-administered medications for this visit.     Past Medical History:  Diagnosis Date  . Alopecia   . Anxiety   . Arthritis   . Cataract   . DDD (degenerative disc disease), cervical   . Diverticulosis of colon   . Esotropia   . History of double vision   . Hyperlipidemia   . Hypertension   . Hypertrophic cardiomyopathy ([place])    by history but not identified on the most recent echo  . Meningioma ([place])   . Mild mitral valve prolapse   . Mitral insufficiency    Mild  . Osteoporosis   . Postmenopausal HRT (hormone replacement therapy)     Past Surgical History:  Procedure Laterality Date  .  ABDOMINAL HYSTERECTOMY    . CATARACT EXTRACTION, BILATERAL    . EYE SURGERY    . TONSILLECTOMY    . VEIN SURGERY      ROS:  As stated in the HPI and negative for all other systems.  PHYSICAL EXAM BP (!) 96/50   Pulse 72   Ht 5\' 4"  (1.626 m)   Wt 138 lb (62.6 kg)   LMP 08/01/1982   BMI 23.69 kg/m  GENERAL:  Well appearing HEENT:  Pupils equal round and reactive, fundi not visualized, oral mucosa unremarkable NECK:  No jugular venous distention, waveform within normal limits, carotid upstroke brisk and symmetric, no bruits, no thyromegaly LYMPHATICS:  No cervical, inguinal adenopathy LUNGS:  Clear to  auscultation bilaterally BACK:  No CVA tenderness CHEST:  Unremarkable HEART:  PMI not displaced or sustained,S1 and S2 within normal limits, no S3, no S4, no clicks, no rubs,  3 out of 6 apical and anterior sternal systolic murmur, no diastolic murmurs ABD:  Flat, positive bowel sounds normal in frequency in pitch, no bruits, no rebound, no guarding, no midline pulsatile mass, no hepatomegaly, no splenomegaly EXT:  2 plus pulses throughout, no edema, no cyanosis no clubbing SKIN:  No rashes no nodules NEURO:  Cranial nerves II through XII grossly intact, motor grossly intact throughout PSYCH:  Cognitively intact, oriented to person place and time     ASSESSMENT AND PLAN  ATRIAL FIB:  The patient tolerates this. She's tolerating anticoagulation. She has reasonable rate control. Given the size of the left atrium I would suspect that cardioversion would either be unsuccessful or not successful long-term. She will continue with rate control and anticoagulation.  [name] has a CHA2DS2 - VASc score of 4 with a risk of stroke of 4%.    MITRAL INSUFFICIENCY:   Her MR has progressed.   She will need a TEE and likely repair in the months to come.  However, I need to clarify the timing of this.  It sounds like she should get her vocal cord treated first.    I note I did talk with her physician to clarify her symptoms. I don't think she's having dysphagia or esophageal problems but rather that her swallowing discomfort is related to her vocal cord  HTN:  The blood pressure is at target. No change in medications is indicated. We will continue with therapeutic lifestyle changes (TLC).  HOARSENESS:  I have discussed with with her laryngologist.  I wonder if this could be Ortner's Syndrome.    Of note I don't see a contraindication to her having the laryngeal procedure though I like to discuss this in more detail. As of now she is confused about whether she should be stopping her warfarin although  she wasn't given this instruction before so she is continuing. I do have a phone call into the MD.     Addendum:  I tried to call Dr. Rowe Clack twice.  I left a message to discuss the above.  I left a cell phone number.  I am still waiting for a call back.

## 2015-11-27 NOTE — Telephone Encounter (Signed)
Rx has been sent over to the pharmacy.

## 2015-11-28 DIAGNOSIS — Z09 Encounter for follow-up examination after completed treatment for conditions other than malignant neoplasm: Secondary | ICD-10-CM | POA: Diagnosis not present

## 2015-11-28 DIAGNOSIS — I83023 Varicose veins of left lower extremity with ulcer of ankle: Secondary | ICD-10-CM | POA: Diagnosis not present

## 2015-11-28 DIAGNOSIS — L97322 Non-pressure chronic ulcer of left ankle with fat layer exposed: Secondary | ICD-10-CM | POA: Diagnosis not present

## 2015-11-28 DIAGNOSIS — Z872 Personal history of diseases of the skin and subcutaneous tissue: Secondary | ICD-10-CM | POA: Diagnosis not present

## 2015-11-28 DIAGNOSIS — I8390 Asymptomatic varicose veins of unspecified lower extremity: Secondary | ICD-10-CM | POA: Diagnosis not present

## 2015-11-29 ENCOUNTER — Ambulatory Visit (INDEPENDENT_AMBULATORY_CARE_PROVIDER_SITE_OTHER): Payer: Medicare Other | Admitting: Cardiology

## 2015-11-29 ENCOUNTER — Ambulatory Visit (INDEPENDENT_AMBULATORY_CARE_PROVIDER_SITE_OTHER): Payer: Medicare Other | Admitting: Pharmacist

## 2015-11-29 ENCOUNTER — Encounter: Payer: Self-pay | Admitting: Cardiology

## 2015-11-29 VITALS — BP 96/50 | HR 72 | Ht 64.0 in | Wt 138.0 lb

## 2015-11-29 DIAGNOSIS — I4819 Other persistent atrial fibrillation: Secondary | ICD-10-CM

## 2015-11-29 DIAGNOSIS — I080 Rheumatic disorders of both mitral and aortic valves: Secondary | ICD-10-CM | POA: Diagnosis not present

## 2015-11-29 DIAGNOSIS — I481 Persistent atrial fibrillation: Secondary | ICD-10-CM

## 2015-11-29 DIAGNOSIS — I480 Paroxysmal atrial fibrillation: Secondary | ICD-10-CM

## 2015-11-29 LAB — COAGUCHEK XS/INR WAIVED
INR: 2 — AB (ref 0.9–1.1)
PROTHROMBIN TIME: 24.2 s

## 2015-11-29 NOTE — Progress Notes (Signed)
No charge for visit today - patient went to lab only and I did not see her.  Discussion of labs by phone.

## 2015-11-29 NOTE — Patient Instructions (Signed)
Medication Instructions:  The current medical regimen is effective;  continue present plan and medications.  Follow-Up: Follow up in 1 year with Dr. Percival Spanish.  You will receive a letter in the mail 2 months before you are due.  Please call us when you receive this letter to schedule your follow up appointment.  If you need a refill on your cardiac medications before your next appointment, please call your pharmacy.  You will be contacted after Dr Percival Spanish speaks with Dr Rowe Clack.  Thank you for choosing [facility]!!

## 2015-11-30 ENCOUNTER — Ambulatory Visit
Admission: RE | Admit: 2015-11-30 | Discharge: 2015-11-30 | Disposition: A | Discharge status: Home / Self Care | Payer: Medicare Other | Source: Ambulatory Visit | Attending: Family Medicine | Admitting: Family Medicine

## 2015-11-30 DIAGNOSIS — R413 Other amnesia: Secondary | ICD-10-CM

## 2015-11-30 DIAGNOSIS — R296 Repeated falls: Secondary | ICD-10-CM

## 2015-11-30 DIAGNOSIS — R42 Dizziness and giddiness: Secondary | ICD-10-CM | POA: Diagnosis not present

## 2015-11-30 NOTE — Addendum Note (Signed)
Addended by: Shellia Cleverly on: 11/30/2015 04:22 PM   Modules accepted: Orders

## 2015-12-01 ENCOUNTER — Telehealth: Payer: Self-pay

## 2015-12-01 ENCOUNTER — Ambulatory Visit: Payer: Medicare Other | Admitting: Pediatrics

## 2015-12-01 DIAGNOSIS — I341 Nonrheumatic mitral (valve) prolapse: Secondary | ICD-10-CM | POA: Diagnosis not present

## 2015-12-01 DIAGNOSIS — L97321 Non-pressure chronic ulcer of left ankle limited to breakdown of skin: Secondary | ICD-10-CM | POA: Diagnosis not present

## 2015-12-01 DIAGNOSIS — I872 Venous insufficiency (chronic) (peripheral): Secondary | ICD-10-CM | POA: Diagnosis not present

## 2015-12-01 DIAGNOSIS — I10 Essential (primary) hypertension: Secondary | ICD-10-CM | POA: Diagnosis not present

## 2015-12-01 DIAGNOSIS — Z7901 Long term (current) use of anticoagulants: Secondary | ICD-10-CM | POA: Diagnosis not present

## 2015-12-01 DIAGNOSIS — M6281 Muscle weakness (generalized): Secondary | ICD-10-CM | POA: Diagnosis not present

## 2015-12-01 NOTE — Telephone Encounter (Signed)
Patient aware and appointment scheduled for tonight with [facility].

## 2015-12-01 NOTE — Telephone Encounter (Signed)
Pt needs to be seen.   Laroy Apple, MD [facility] 12/01/2015, 12:20 PM

## 2015-12-05 ENCOUNTER — Ambulatory Visit (INDEPENDENT_AMBULATORY_CARE_PROVIDER_SITE_OTHER): Payer: Medicare Other | Admitting: Pharmacist

## 2015-12-05 ENCOUNTER — Telehealth: Payer: Self-pay | Admitting: *Deleted

## 2015-12-05 DIAGNOSIS — I480 Paroxysmal atrial fibrillation: Secondary | ICD-10-CM

## 2015-12-05 LAB — COAGUCHEK XS/INR WAIVED
INR: 1.6 — ABNORMAL HIGH (ref 0.9–1.1)
Prothrombin Time: 19.6 s

## 2015-12-05 NOTE — Telephone Encounter (Signed)
-----   Message from Minus Breeding, MD sent at 12/04/2015  9:27 PM EDT ----- I tried to call after you did and left my cell phone number with her office.  She never called back.  Please let the patient's family know and document in the chart please.  Thanks.    ----- Message ----- From: Shellia Cleverly, RN Sent: 12/01/2015   8:18 AM To: Minus Breeding, MD  Did you ever hear back from Dr Mila Homer [facility] about this pt's procedure?  If not, her phone # is [phone] (to the office). Let me know what y'all decide so I can call her son Kandise Sessums [phone] or [phone].  Thanks!!  Jeannene Patella

## 2015-12-05 NOTE — Telephone Encounter (Signed)
Spoke with son Marchetta Soles who is aware both Dr Percival Spanish and I have called and left messages for Dr Rowe Clack to call back to discuss pt's care without result.

## 2015-12-06 DIAGNOSIS — I341 Nonrheumatic mitral (valve) prolapse: Secondary | ICD-10-CM | POA: Diagnosis not present

## 2015-12-06 DIAGNOSIS — M6281 Muscle weakness (generalized): Secondary | ICD-10-CM | POA: Diagnosis not present

## 2015-12-06 DIAGNOSIS — L97321 Non-pressure chronic ulcer of left ankle limited to breakdown of skin: Secondary | ICD-10-CM | POA: Diagnosis not present

## 2015-12-06 DIAGNOSIS — I872 Venous insufficiency (chronic) (peripheral): Secondary | ICD-10-CM | POA: Diagnosis not present

## 2015-12-06 DIAGNOSIS — I10 Essential (primary) hypertension: Secondary | ICD-10-CM | POA: Diagnosis not present

## 2015-12-06 DIAGNOSIS — Z7901 Long term (current) use of anticoagulants: Secondary | ICD-10-CM | POA: Diagnosis not present

## 2015-12-07 ENCOUNTER — Telehealth: Payer: Self-pay | Admitting: Cardiology

## 2015-12-07 DIAGNOSIS — Z538 Procedure and treatment not carried out for other reasons: Secondary | ICD-10-CM | POA: Diagnosis not present

## 2015-12-07 DIAGNOSIS — Z7901 Long term (current) use of anticoagulants: Secondary | ICD-10-CM | POA: Diagnosis not present

## 2015-12-07 DIAGNOSIS — I4891 Unspecified atrial fibrillation: Secondary | ICD-10-CM | POA: Diagnosis not present

## 2015-12-07 DIAGNOSIS — J383 Other diseases of vocal cords: Secondary | ICD-10-CM | POA: Diagnosis not present

## 2015-12-07 NOTE — Telephone Encounter (Signed)
New message     Dr. Rowe Clack would like Dr. Percival Spanish to call her about the pt. Dr. Rowe Clack is cancelling the OR case. The pt was suppose to get vocal cord procedure but would like to discuss it with Dr. Percival Spanish. Please call.

## 2015-12-07 NOTE — Telephone Encounter (Signed)
I finally was able to speak with Dr. Rowe Clack and the patient's ENT appt was cancelled.  Please arrange for the patient to have a TEE.  Please let me know when the appt is so I can write the orders.

## 2015-12-08 DIAGNOSIS — L97321 Non-pressure chronic ulcer of left ankle limited to breakdown of skin: Secondary | ICD-10-CM | POA: Diagnosis not present

## 2015-12-08 DIAGNOSIS — I10 Essential (primary) hypertension: Secondary | ICD-10-CM | POA: Diagnosis not present

## 2015-12-08 DIAGNOSIS — I341 Nonrheumatic mitral (valve) prolapse: Secondary | ICD-10-CM | POA: Diagnosis not present

## 2015-12-08 DIAGNOSIS — M6281 Muscle weakness (generalized): Secondary | ICD-10-CM | POA: Diagnosis not present

## 2015-12-08 DIAGNOSIS — I872 Venous insufficiency (chronic) (peripheral): Secondary | ICD-10-CM | POA: Diagnosis not present

## 2015-12-08 DIAGNOSIS — Z7901 Long term (current) use of anticoagulants: Secondary | ICD-10-CM | POA: Diagnosis not present

## 2015-12-08 NOTE — Telephone Encounter (Signed)
TEE scheduled for 12/12/15 at 11AM Dr Stanford Breed.  Pt and pt's son, Patricia Villa are aware TEE is scheduled for 12/12/15 11AM, arrive 9:30AM.  Instructions for TEE discussed with pt and son, Patricia Villa.

## 2015-12-08 NOTE — Telephone Encounter (Signed)
Pt's son, Doren Custard [phone] concerns he would like to discuss with Dr Percival Spanish before TEE 12/12/15.  Doren Custard advised I will forward to Dr Percival Spanish for review.

## 2015-12-11 ENCOUNTER — Other Ambulatory Visit: Payer: Self-pay | Admitting: Physician Assistant

## 2015-12-11 ENCOUNTER — Other Ambulatory Visit: Payer: Self-pay | Admitting: Cardiology

## 2015-12-11 DIAGNOSIS — I341 Nonrheumatic mitral (valve) prolapse: Secondary | ICD-10-CM | POA: Diagnosis not present

## 2015-12-11 DIAGNOSIS — Z7901 Long term (current) use of anticoagulants: Secondary | ICD-10-CM | POA: Diagnosis not present

## 2015-12-11 DIAGNOSIS — I340 Nonrheumatic mitral (valve) insufficiency: Secondary | ICD-10-CM

## 2015-12-11 DIAGNOSIS — I10 Essential (primary) hypertension: Secondary | ICD-10-CM | POA: Diagnosis not present

## 2015-12-11 DIAGNOSIS — R1313 Dysphagia, pharyngeal phase: Secondary | ICD-10-CM | POA: Diagnosis not present

## 2015-12-11 DIAGNOSIS — I872 Venous insufficiency (chronic) (peripheral): Secondary | ICD-10-CM | POA: Diagnosis not present

## 2015-12-11 DIAGNOSIS — L97321 Non-pressure chronic ulcer of left ankle limited to breakdown of skin: Secondary | ICD-10-CM | POA: Diagnosis not present

## 2015-12-11 DIAGNOSIS — M6281 Muscle weakness (generalized): Secondary | ICD-10-CM | POA: Diagnosis not present

## 2015-12-12 ENCOUNTER — Ambulatory Visit (HOSPITAL_BASED_OUTPATIENT_CLINIC_OR_DEPARTMENT_OTHER)
Admission: RE | Admit: 2015-12-12 | Discharge: 2015-12-12 | Disposition: A | Discharge status: Home / Self Care | Payer: Medicare Other | Source: Ambulatory Visit | Attending: Physician Assistant | Admitting: Physician Assistant

## 2015-12-12 ENCOUNTER — Encounter (HOSPITAL_COMMUNITY): Payer: Self-pay | Admitting: *Deleted

## 2015-12-12 ENCOUNTER — Encounter (HOSPITAL_COMMUNITY)
Admission: RE | Disposition: A | Discharge status: Home / Self Care | Payer: Self-pay | Source: Ambulatory Visit | Attending: Cardiology

## 2015-12-12 ENCOUNTER — Ambulatory Visit (HOSPITAL_COMMUNITY)
Admission: RE | Admit: 2015-12-12 | Discharge: 2015-12-12 | Disposition: A | Discharge status: Home / Self Care | Payer: Medicare Other | Source: Ambulatory Visit | Attending: Cardiology | Admitting: Cardiology

## 2015-12-12 DIAGNOSIS — I4891 Unspecified atrial fibrillation: Secondary | ICD-10-CM | POA: Insufficient documentation

## 2015-12-12 DIAGNOSIS — Z79899 Other long term (current) drug therapy: Secondary | ICD-10-CM | POA: Diagnosis not present

## 2015-12-12 DIAGNOSIS — I10 Essential (primary) hypertension: Secondary | ICD-10-CM | POA: Diagnosis not present

## 2015-12-12 DIAGNOSIS — R490 Dysphonia: Secondary | ICD-10-CM | POA: Diagnosis not present

## 2015-12-12 DIAGNOSIS — I340 Nonrheumatic mitral (valve) insufficiency: Secondary | ICD-10-CM

## 2015-12-12 DIAGNOSIS — Z7901 Long term (current) use of anticoagulants: Secondary | ICD-10-CM | POA: Diagnosis not present

## 2015-12-12 DIAGNOSIS — I341 Nonrheumatic mitral (valve) prolapse: Secondary | ICD-10-CM | POA: Insufficient documentation

## 2015-12-12 HISTORY — PX: TEE WITHOUT CARDIOVERSION: SHX5443

## 2015-12-12 SURGERY — ECHOCARDIOGRAM, TRANSESOPHAGEAL
Anesthesia: Moderate Sedation

## 2015-12-12 MED ORDER — MIDAZOLAM HCL 5 MG/ML IJ SOLN
INTRAMUSCULAR | Status: AC
  Filled 2015-12-12: qty 2

## 2015-12-12 MED ORDER — SODIUM CHLORIDE 0.9 % IV SOLN
INTRAVENOUS | Status: DC
  Administered 2015-12-12: 500 mL via INTRAVENOUS

## 2015-12-12 MED ORDER — FENTANYL CITRATE (PF) 100 MCG/2ML IJ SOLN
INTRAMUSCULAR | Status: AC
  Filled 2015-12-12: qty 2

## 2015-12-12 MED ORDER — FENTANYL CITRATE (PF) 100 MCG/2ML IJ SOLN
INTRAMUSCULAR | Status: DC | PRN
  Administered 2015-12-12: 25 ug via INTRAVENOUS

## 2015-12-12 MED ORDER — BUTAMBEN-TETRACAINE-BENZOCAINE 2-2-14 % EX AERO
INHALATION_SPRAY | CUTANEOUS | Status: DC | PRN
  Administered 2015-12-12: 2 via TOPICAL

## 2015-12-12 MED ORDER — MIDAZOLAM HCL 10 MG/2ML IJ SOLN
INTRAMUSCULAR | Status: DC | PRN
  Administered 2015-12-12 (×2): 1 mg via INTRAVENOUS

## 2015-12-12 NOTE — H&P (Signed)
Patricia Villa  11/29/2015 11:30 AM  Office Visit  MRN:  [id number]  Description: Female DOB: 12-23-1932 Provider: Minus Breeding, MD Department: [facility]  Vitals   BP    96/50     Pulse  72     Ht  5\' 4"  (1.626 m)     Wt  138 lb (62.6 kg)     LMP  08/01/1982      BMI  23.69 kg/m    Progress Notes   Minus Breeding, MD at 11/29/2015 11:30 AM   Status: Addendum       HPI The patient presents for followup of MR and atrial fib.  I sent her for an echocardiogram to follow up MR.  This was more severe than previous with an apparent partially flail anterior leaflet with moderate to severe eccentric MR.   There was also moderately severe TR with elevated pulmonary pressures.     She returns for follow up prior to having throat surgery.  She's going to have a procedure to treat vocal cord paralysis  This was attempted in the office previously but the pain was prohibitive.  Apparently she's planning a procedure in the hospital with anesthesia.She's having trouble it seems with secretions and possibly aspiration.  She feels like fluid collects and she has a difficult time swallowing.  She does have some DOE with exertion and does have some increased fatigue and increased somnolence.  The patient denies any new symptoms such as chest discomfort, neck or arm discomfort. There has been no new shortness of breath, PND or orthopnea. There have been no reported palpitations, presyncope or syncope.        Allergies  Allergen Reactions  . Actonel [Risedronate Sodium] Other (See Comments)    Esophagitis  . Diphenhydramine Hcl Hives  . Fosamax [Alendronate Sodium] Other (See Comments)    Esophagitis   . Livalo [Pitavastatin Calcium] Other (See Comments)    Pt doesn't remember reaction  . Pravastatin Other (See Comments)    Extreme leg cramping.  . Sulfa Antibiotics Other (See Comments)    Pt doesn't remember reaction  . Zocor [Simvastatin] Other (See Comments)     Pt doesn't remember reaction          Current Outpatient Prescriptions  Medication Sig Dispense Refill  . amLODipine (NORVASC) 2.5 MG tablet Take 1 tablet (2.5 mg total) by mouth daily. 90 tablet 3  . amoxicillin-clavulanate (AUGMENTIN) 875-125 MG tablet Take 1 tablet by mouth 2 (two) times daily. 20 tablet 0  . Cholecalciferol (VITAMIN D3) 2000 UNITS TABS Take 2,000 Units by mouth daily.    . clobetasol cream (TEMOVATE) 0.05 % Apply topically 2 (two) times daily. 30 g 0  . DULoxetine (CYMBALTA) 30 MG capsule Take 1 capsule (30 mg total) by mouth daily. 30 capsule 2  . furosemide (LASIX) 20 MG tablet Take 1 tablet (20 mg total) by mouth daily. 90 tablet 3  . lisinopril (PRINIVIL,ZESTRIL) 40 MG tablet Take 1 tablet (40 mg total) by mouth daily. 90 tablet 3  . metoprolol succinate (TOPROL-XL) 25 MG 24 hr tablet Take 0.5 tablets (12.5 mg total) by mouth daily. 45 tablet 3  . polyethylene glycol (MIRALAX / GLYCOLAX) packet Take 17 g by mouth daily as needed (for constipation.).    Marland Kitchen warfarin (COUMADIN) 5 MG tablet Take 1 tablet (5 mg total) by mouth daily. 30 tablet 6   No current facility-administered medications for this visit.         Past Medical  History:  Diagnosis Date  . Alopecia   . Anxiety   . Arthritis   . Cataract   . DDD (degenerative disc disease), cervical   . Diverticulosis of colon   . Esotropia   . History of double vision   . Hyperlipidemia   . Hypertension   . Hypertrophic cardiomyopathy ([place])    by history but not identified on the most recent echo  . Meningioma ([place])   . Mild mitral valve prolapse   . Mitral insufficiency    Mild  . Osteoporosis   . Postmenopausal HRT (hormone replacement therapy)          Past Surgical History:  Procedure Laterality Date  . ABDOMINAL HYSTERECTOMY    . CATARACT EXTRACTION, BILATERAL    . EYE SURGERY    . TONSILLECTOMY    . VEIN SURGERY      ROS:  As stated in the HPI and  negative for all other systems.  PHYSICAL EXAM BP (!) 96/50   Pulse 72   Ht 5\' 4"  (1.626 m)   Wt 138 lb (62.6 kg)   LMP 08/01/1982   BMI 23.69 kg/m  GENERAL:  Well appearing HEENT:  Pupils equal round and reactive, fundi not visualized, oral mucosa unremarkable NECK:  No jugular venous distention, waveform within normal limits, carotid upstroke brisk and symmetric, no bruits, no thyromegaly LYMPHATICS:  No cervical, inguinal adenopathy LUNGS:  Clear to auscultation bilaterally BACK:  No CVA tenderness CHEST:  Unremarkable HEART:  PMI not displaced or sustained,S1 and S2 within normal limits, no S3, no S4, no clicks, no rubs,  3 out of 6 apical and anterior sternal systolic murmur, no diastolic murmurs ABD:  Flat, positive bowel sounds normal in frequency in pitch, no bruits, no rebound, no guarding, no midline pulsatile mass, no hepatomegaly, no splenomegaly EXT:  2 plus pulses throughout, no edema, no cyanosis no clubbing SKIN:  No rashes no nodules NEURO:  Cranial nerves II through XII grossly intact, motor grossly intact throughout PSYCH:  Cognitively intact, oriented to person place and time     ASSESSMENT AND PLAN  ATRIAL FIB:  The patient tolerates this. She's tolerating anticoagulation. She has reasonable rate control. Given the size of the left atrium I would suspect that cardioversion would either be unsuccessful or not successful long-term. She will continue with rate control and anticoagulation.  Patricia Villa has a CHA2DS2 - VASc score of 4 with a risk of stroke of 4%.    MITRAL INSUFFICIENCY:   Her MR has progressed.   She will need a TEE and likely repair in the months to come.  However, I need to clarify the timing of this.  It sounds like she should get her vocal cord treated first.    I note I did talk with her physician to clarify her symptoms. I don't think she's having dysphagia or esophageal problems but rather that her swallowing discomfort is related to  her vocal cord  HTN:  The blood pressure is at target. No change in medications is indicated. We will continue with therapeutic lifestyle changes (TLC).  HOARSENESS:  I have discussed with with her laryngologist.  I wonder if this could be Ortner's Syndrome.    Of note I don't see a contraindication to her having the laryngeal procedure though I like to discuss this in more detail. As of now she is confused about whether she should be stopping her warfarin although she wasn't given this instruction before so  she is continuing. I do have a phone call into the MD.     Addendum:  I tried to call Dr. Rowe Clack twice.  I left a message to discuss the above.  I left a cell phone number.  I am still waiting for a call back.      For TEE; no changes. Kirk Ruths

## 2015-12-12 NOTE — Discharge Instructions (Signed)

## 2015-12-12 NOTE — Telephone Encounter (Signed)
I spoke with her son.  He understand the need for TEE.  He reports that the patient has had a swallowing study and has some aspiration but no evidence of esophageal stricture.  Orders have been written for her procedure by Dr. Stanford Breed.

## 2015-12-12 NOTE — CV Procedure (Signed)
See full report in [employer] Patient sedated with versed 2 mg and fentanyl 25 micrograms IV Normal LV function Severe biatrial enlargement Thickened MV with flail anterior leaflet and ruptured chord; restricted posterior leaflet with mild prolapse; severe, eccentric posterior lateral MR. Moderate TR, Mild AI, Mild to moderate PI.  Kirk Ruths, MD

## 2015-12-12 NOTE — Progress Notes (Signed)
  Echocardiogram Echocardiogram Transesophageal has been performed.  Johny Chess 12/12/2015, 11:57 AM

## 2015-12-12 NOTE — Interval H&P Note (Signed)
History and Physical Interval Note:  12/12/2015 11:00 AM  Patricia Villa  has presented today for surgery, with the diagnosis of MITRAL REGURGITAITON  The various methods of treatment have been discussed with the patient and family. After consideration of risks, benefits and other options for treatment, the patient has consented to  Procedure(s): TRANSESOPHAGEAL ECHOCARDIOGRAM (TEE) (N/A) as a surgical intervention .  The patient's history has been reviewed, patient examined, no change in status, stable for surgery.  I have reviewed the patient's chart and labs.  Questions were answered to the patient's satisfaction.     Kirk Ruths

## 2015-12-13 ENCOUNTER — Telehealth: Payer: Self-pay

## 2015-12-13 NOTE — Telephone Encounter (Signed)
Spoke with pt's sister who states her sister needs an appt for back and leg pain. I advised Patricia Villa that I would need to speak with her sister to schedule the appt so will call her.

## 2015-12-14 ENCOUNTER — Ambulatory Visit: Payer: Medicare Other | Admitting: Cardiology

## 2015-12-14 ENCOUNTER — Encounter: Payer: Self-pay | Admitting: Cardiology

## 2015-12-14 NOTE — Progress Notes (Signed)
HPI The patient presents for followup of MR and atrial fib.  I sent her for an echocardiogram to follow up MR.  This was more severe than previous with an apparent partially flail anterior leaflet with moderate to severe eccentric MR.   There was also moderately severe TR with elevated pulmonary pressures.    She has since had TEE which confirms ruptured chords with severe flail motion of the anterior leaflet.  (I have reviewed the images.)  The patient presents to have further discussion. She's been somewhat frail and increasingly so over time. She has increasing dyspnea with mild exertion. She's not however describing any PND or orthopnea. She's not noticing any palpitations, presyncope or syncope. She is very bothered by her vocal cord paralysis and a sense of having to get up regularly during the night because of phlegm collecting.  Of note I was able to get a hold of surgeon at [facility] who was going to do her vocal cord procedure. They decided to cancel it because of her ongoing cardiac issues.  Allergies  Allergen Reactions  . Actonel [Risedronate Sodium] Other (See Comments)    Esophagitis   . Diphenhydramine Hcl Hives  . Fosamax [Alendronate Sodium] Other (See Comments)    Esophagitis   . Livalo [Pitavastatin Calcium] Other (See Comments)    Pt doesn't remember reaction  . Pravastatin Other (See Comments)    Extreme leg cramping.  . Sulfa Antibiotics Other (See Comments)    Pt doesn't remember reaction  . Zocor [Simvastatin] Other (See Comments)    Pt doesn't remember reaction    Current Outpatient Prescriptions  Medication Sig Dispense Refill  . amLODipine (NORVASC) 2.5 MG tablet Take 1 tablet (2.5 mg total) by mouth daily. 90 tablet 3  . Cholecalciferol (VITAMIN D3) 2000 UNITS TABS Take 2,000 Units by mouth daily.    . clobetasol cream (TEMOVATE) 0.05 % Apply topically 2 (two) times daily. 30 g 0  . DULoxetine (CYMBALTA) 30 MG capsule Take 1 capsule (30 mg total) by mouth  daily. 30 capsule 2  . lisinopril (PRINIVIL,ZESTRIL) 40 MG tablet Take 1 tablet (40 mg total) by mouth daily. 90 tablet 3  . metoprolol succinate (TOPROL-XL) 25 MG 24 hr tablet Take 0.5 tablets (12.5 mg total) by mouth daily. 45 tablet 3  . polyethylene glycol (MIRALAX / GLYCOLAX) packet Take 17 g by mouth daily as needed (for constipation.).    Marland Kitchen warfarin (COUMADIN) 5 MG tablet Take 1 tablet (5 mg total) by mouth daily. (Patient taking differently: Take 5 mg by mouth daily. Managed by Dr. Laurance Flatten) 30 tablet 6  . furosemide (LASIX) 40 MG tablet Take 1 tablet (40 mg total) by mouth daily. 90 tablet 3   No current facility-administered medications for this visit.     Past Medical History:  Diagnosis Date  . Alopecia   . Anxiety   . Arthritis   . Cataract   . DDD (degenerative disc disease), cervical   . Diverticulosis of colon   . Esotropia   . History of double vision   . Hyperlipidemia   . Hypertension   . Hypertrophic cardiomyopathy ([place])    by history but not identified on the most recent echo  . Meningioma ([place])   . Mild mitral valve prolapse   . Mitral insufficiency    Severe with anterior flail leaflet.    . Osteoporosis   . Postmenopausal HRT (hormone replacement therapy)     Past Surgical History:  Procedure Laterality Date  .  ABDOMINAL HYSTERECTOMY    . CATARACT EXTRACTION, BILATERAL    . EYE SURGERY    . TEE WITHOUT CARDIOVERSION N/A 12/12/2015   Procedure: TRANSESOPHAGEAL ECHOCARDIOGRAM (TEE);  Surgeon: Lelon Perla, MD;  Location[facility] ENDOSCOPY;  Service: Cardiovascular;  Laterality: N/A;  . TONSILLECTOMY    . VEIN SURGERY      ROS:  As stated in the HPI and negative for all other systems.  PHYSICAL EXAM BP (!) 102/58   Pulse 61   Ht 5\' 4"  (1.626 m)   Wt 134 lb (60.8 kg)   LMP 08/01/1982   SpO2 98%   BMI 23.00 kg/m  GENERAL:  Well appearing but increasingly frail NECK:  Positive jugular venous distention, waveform within normal limits, carotid upstroke  brisk and symmetric, no bruits, no thyromegaly LUNGS:  Clear to auscultation bilaterally CHEST:  Unremarkable HEART:  PMI not displaced or sustained,S1 and S2 within normal limits, no S3,  no clicks, no rubs,  3 out of 6 apical holosystolic murmur, no diastolic murmurs, irregular ABD:  Flat, positive bowel sounds normal in frequency in pitch, no bruits, no rebound, no guarding, no midline pulsatile mass, no hepatomegaly, no splenomegaly EXT:  2 plus pulses throughout, no edema, no cyanosis no clubbing, healed right leg ulcer SKIN:  No rashes no nodules  Lab Results  Component Value Date   TSH 0.857 11/20/2015   Lab Results  Component Value Date   WBC 5.9 11/20/2015   HGB 11.4 (A) 10/18/2014   HCT 35.7 11/20/2015   MCV 91 11/20/2015   PLT 255 11/20/2015    ASSESSMENT AND PLAN  ATRIAL FIB:  The patient tolerates this. She's tolerating anticoagulation. She has reasonable rate control. Given the size of the left atrium I would suspect that cardioversion would either be unsuccessful or not successful long-term. She will continue with rate control and anticoagulation.  [name] has a CHA2DS2 - VASc score of 4 with a risk of stroke of 4%.    MITRAL INSUFFICIENCY:   This is severe and related to a flail leaflet.  She has increased pulmonary pressures.  Although she is frail for her age and increasingly so somewhat acute change in the severity of her MR we need to consider surgical repair.  I spoke with her and her son about this.  They understand that without this she would likely have rapid progression of symptoms.  I will set up a surgical appt.   I am going to increase her Lasix to 40 mg daily.  I will check a BMET in 10 days.   HTN:  The blood pressure is at target. No change in medications is indicated. We will continue with therapeutic lifestyle changes (TLC).  HOARSENESS:  I was able to discuss her case with the laryngologist at [facility].  They decided not to proceed with her  procedure given that this was elective and in the light of her cardiac issues.  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

## 2015-12-15 ENCOUNTER — Ambulatory Visit (INDEPENDENT_AMBULATORY_CARE_PROVIDER_SITE_OTHER): Payer: Medicare Other | Admitting: Cardiology

## 2015-12-15 ENCOUNTER — Encounter: Payer: Self-pay | Admitting: Cardiology

## 2015-12-15 VITALS — BP 102/58 | HR 61 | Ht 64.0 in | Wt 134.0 lb

## 2015-12-15 DIAGNOSIS — I080 Rheumatic disorders of both mitral and aortic valves: Secondary | ICD-10-CM | POA: Diagnosis not present

## 2015-12-15 DIAGNOSIS — I059 Rheumatic mitral valve disease, unspecified: Secondary | ICD-10-CM

## 2015-12-15 DIAGNOSIS — I10 Essential (primary) hypertension: Secondary | ICD-10-CM | POA: Diagnosis not present

## 2015-12-15 MED ORDER — FUROSEMIDE 40 MG PO TABS
40.0000 mg | ORAL_TABLET | Freq: Every day | ORAL | 3 refills | Status: DC
Start: 2015-12-15 — End: 2016-02-22

## 2015-12-15 NOTE — Patient Instructions (Addendum)
Medication Instructions:   Your physician has recommended you make the following change in your medication:  Increase lasix to 40 mg daily. You may take (2) of your 20 mg daily until they are finished.  Continue all other medications the same.  Labwork:  Your physician recommends that you have lab work in 10 days to check your BMET. Your lab work order has been given to you today with your visit. Please take this to the lab of your choice.  Testing/Procedures: NONE  Follow-Up:  Your physician recommends that you schedule a follow-up appointment in: 3 months.  Any Other Special Instructions Will Be Listed Below (If Applicable).  You have been referred to Dr. Roxy Manns.  If you need a refill on your cardiac medications before your next appointment, please call your pharmacy.

## 2015-12-18 DIAGNOSIS — M6281 Muscle weakness (generalized): Secondary | ICD-10-CM | POA: Diagnosis not present

## 2015-12-18 DIAGNOSIS — I341 Nonrheumatic mitral (valve) prolapse: Secondary | ICD-10-CM | POA: Diagnosis not present

## 2015-12-18 DIAGNOSIS — Z7901 Long term (current) use of anticoagulants: Secondary | ICD-10-CM | POA: Diagnosis not present

## 2015-12-18 DIAGNOSIS — I10 Essential (primary) hypertension: Secondary | ICD-10-CM | POA: Diagnosis not present

## 2015-12-18 DIAGNOSIS — L97321 Non-pressure chronic ulcer of left ankle limited to breakdown of skin: Secondary | ICD-10-CM | POA: Diagnosis not present

## 2015-12-18 DIAGNOSIS — I872 Venous insufficiency (chronic) (peripheral): Secondary | ICD-10-CM | POA: Diagnosis not present

## 2015-12-19 ENCOUNTER — Ambulatory Visit (INDEPENDENT_AMBULATORY_CARE_PROVIDER_SITE_OTHER): Payer: Medicare Other | Admitting: Pharmacist

## 2015-12-19 DIAGNOSIS — I480 Paroxysmal atrial fibrillation: Secondary | ICD-10-CM

## 2015-12-19 LAB — COAGUCHEK XS/INR WAIVED
INR: 1.2 — AB (ref 0.9–1.1)
PROTHROMBIN TIME: 14.8 s

## 2015-12-20 ENCOUNTER — Ambulatory Visit (INDEPENDENT_AMBULATORY_CARE_PROVIDER_SITE_OTHER): Payer: Medicare Other | Admitting: Neurology

## 2015-12-20 ENCOUNTER — Encounter: Payer: Self-pay | Admitting: Neurology

## 2015-12-20 VITALS — BP 111/63 | HR 65 | Ht 64.0 in | Wt 134.8 lb

## 2015-12-20 DIAGNOSIS — W19XXXA Unspecified fall, initial encounter: Secondary | ICD-10-CM

## 2015-12-20 DIAGNOSIS — Z79899 Other long term (current) drug therapy: Secondary | ICD-10-CM | POA: Diagnosis not present

## 2015-12-20 DIAGNOSIS — D333 Benign neoplasm of cranial nerves: Secondary | ICD-10-CM

## 2015-12-20 DIAGNOSIS — I639 Cerebral infarction, unspecified: Secondary | ICD-10-CM

## 2015-12-20 DIAGNOSIS — G3184 Mild cognitive impairment, so stated: Secondary | ICD-10-CM

## 2015-12-20 NOTE — Patient Instructions (Signed)
I had a long discussion with the patient and her son regarding the multiple neurological issues. 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 She also has long-standing memory loss and mild cognitive impairment. MRI scan showed a silent left occipital infarct likely from atrial fibrillation. I recommend checking lab work today for reversible causes of memory loss, EEG and CT angiogram of brain and neck. Patient is refusing medications like Aricept and Namenda and the present time. I recommend she continue anticoagulation with warfarin for secondary stroke prevention and maintain strict control of hypertension with blood pressure goal below 140/90, lipids with LDL cholesterol goal below 70 mg percent. I have also given her fall and safety precautions. 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 Return for follow-up in 3 months or call earlier if necessary.  Fall Prevention in the Home  Falls can cause injuries and can affect people from all age groups. There are many simple things that you can do to make your home safe and to help prevent falls. WHAT CAN I DO ON THE OUTSIDE OF MY HOME?  Regularly repair the edges of walkways and driveways and fix any cracks.  Remove high doorway thresholds.  Trim any shrubbery on the main path into your home.  Use bright outdoor lighting.  Clear walkways of debris and clutter, including tools and rocks.  Regularly check that handrails are securely fastened and in good repair. Both sides of any steps should have handrails.  Install guardrails along the edges of any raised decks or porches.  Have leaves, snow, and ice cleared regularly.  Use sand or salt on walkways during winter months.  In the garage, clean up any spills right away, including grease or oil  spills. WHAT CAN I DO IN THE BATHROOM?  Use night lights.  Install grab bars by the toilet and in the tub and shower. Do not use towel bars as grab bars.  Use non-skid mats or decals on the floor of the tub or shower.  If you need to sit down while you are in the shower, use a plastic, non-slip stool.Marland Kitchen  Keep the floor dry. Immediately clean up any water that spills on the floor.  Remove soap buildup in the tub or shower on a regular basis.  Attach bath mats securely with double-sided non-slip rug tape.  Remove throw rugs and other tripping hazards from the floor. WHAT CAN I DO IN THE BEDROOM?  Use night lights.  Make sure that a bedside light is easy to reach.  Do not use oversized bedding that drapes onto the floor.  Have a firm chair that has side arms to use for getting dressed.  Remove throw rugs and other tripping hazards from the floor. WHAT CAN I DO IN THE KITCHEN?   Clean up any spills right away.  Avoid walking on wet floors.  Place frequently used items in easy-to-reach places.  If you need to reach for something above you, use a sturdy step stool that has a grab bar.  Keep electrical cables out of the way.  Do not use floor polish or wax that makes floors slippery. If you have to use wax, make sure that it is non-skid floor wax.  Remove throw rugs and other tripping hazards from the floor. WHAT CAN I DO IN THE STAIRWAYS?  Do not leave any items  on the stairs.  Make sure that there are handrails on both sides of the stairs. Fix handrails that are broken or loose. Make sure that handrails are as long as the stairways.  Check any carpeting to make sure that it is firmly attached to the stairs. Fix any carpet that is loose or worn.  Avoid having throw rugs at the top or bottom of stairways, or secure the rugs with carpet tape to prevent them from moving.  Make sure that you have a light switch at the top of the stairs and the bottom of the stairs. If you  do not have them, have them installed. WHAT ARE SOME OTHER FALL PREVENTION TIPS?  Wear closed-toe shoes that fit well and support your feet. Wear shoes that have rubber soles or low heels.  When you use a stepladder, make sure that it is completely opened and that the sides are firmly locked. Have someone hold the ladder while you are using it. Do not climb a closed stepladder.  Add color or contrast paint or tape to grab bars and handrails in your home. Place contrasting color strips on the first and last steps.  Use mobility aids as needed, such as canes, walkers, scooters, and crutches.  Turn on lights if it is dark. Replace any light bulbs that burn out.  Set up furniture so that there are clear paths. Keep the furniture in the same spot.  Fix any uneven floor surfaces.  Choose a carpet design that does not hide the edge of steps of a stairway.  Be aware of any and all pets.  Review your medicines with your healthcare provider. Some medicines can cause dizziness or changes in blood pressure, which increase your risk of falling. Talk with your health care provider about other ways that you can decrease your risk of falls. This may include working with a physical therapist or trainer to improve your strength, balance, and endurance.   This information is not intended to replace advice given to you by your health care provider. Make sure you discuss any questions you have with your health care provider.   Document Released: 04/12/2002 Document Revised: 09/06/2014 Document Reviewed: 05/27/2014 Elsevier Interactive Patient Education [employer].

## 2015-12-20 NOTE — Progress Notes (Signed)
[facility] [address] [place]. [place] [address] [phone]       OFFICE CONSULT NOTE  Patricia. DANIEAL HAUENSTEIN Date of Birth:  Sep 16, 1932 Medical Record Number:  [id number]   Referring MD:   Redge Gainer Reason for Referral:   falls  HPI: Patricia Villa is a 74 year pleasant Caucasian lady who is accompanied by her son today. She has had 3 falls over the last 6 weeks. She states that the fall mostly occur when she is getting up and looking up or leaning backwards. All the fall she is fallen backwards. She has never lost consciousness. She feels off balance. She denies nausea, vomiting vertigo loss of vision or blurred vision. She had is MRI scan of the brain on 5/25 for 17 which are personally reviewed shows left cerebellar pontine angle meningioma which is stable compared with prior scans and there is a small enhancing area of the left occipital cortex which may represent possible subacute infarct. The patient complains of long-standing tinnitus and slight decreased hearing on the left for more than 10 years but denies that this has progressed recently. She also has long-standing history of memory loss and word finding difficulties last several years. She states she was tried on Aricept but did not tolerate it. She has not tried medications like Namenda but is not keen to try new medicines. She also has chronic diplopia for which she has seen Dr. Thornton Park [facility] and prescribed prism glasses which seem to help. She passes a cardiac surgeon October and cardiac valvuloplasty soon. Patient denies any tingling numbness and burning in her pain. She denies any neck pain and radicular pain. She has no history of falls with head injury a significant loss of consciousness. She has no known prior history of strokes TIAs seizures. She denies peripheral vision loss on the right of bumping into objects. She has history of atrial fibrillation and is on long-term  anticoagulation with warfarin.  ROS:   14 system review of systems is positive for  fatigue, palpitations, murmur, leg swellings, hearing loss, ringing in the ears, trouble swallowing, rash, itching, double vision, shortness of breath, incontinence, urination problems, incontinence, easy bruising, feeling hot, increased thirst, joint pain, muscle cramps, memory loss, confusion, difficulty swallowing, sleepiness, dizziness, decreased energy  PMH:  Past Medical History:  Diagnosis Date  . Alopecia   . Anxiety   . Arthritis   . Cataract   . DDD (degenerative disc disease), cervical   . Diverticulosis of colon   . Esotropia   . History of double vision   . Hyperlipidemia   . Hypertension   . Hypertrophic cardiomyopathy ([place])    by history but not identified on the most recent echo  . Meningioma ([place])   . Mild mitral valve prolapse   . Mitral insufficiency    Severe with anterior flail leaflet.    . Osteoporosis   . Postmenopausal HRT (hormone replacement therapy)     Social History:  Social History   Social History  . Marital status: Married    Spouse name: N/A  . Number of children: N/A  . Years of education: N/A   Occupational History  . Not on file.   Social History Main Topics  . Smoking status: Never Smoker  . Smokeless tobacco: Never Used     Comment: Rare previously  . Alcohol use No  . Drug use: No  . Sexual activity: No   Other Topics Concern  .  Not on file   Social History Narrative  . No narrative on file    Medications:   Current Outpatient Prescriptions on File Prior to Visit  Medication Sig Dispense Refill  . amLODipine (NORVASC) 2.5 MG tablet Take 1 tablet (2.5 mg total) by mouth daily. 90 tablet 3  . Cholecalciferol (VITAMIN D3) 2000 UNITS TABS Take 2,000 Units by mouth daily.    . clobetasol cream (TEMOVATE) 0.05 % Apply topically 2 (two) times daily. 30 g 0  . DULoxetine (CYMBALTA) 30 MG capsule Take 1 capsule (30 mg total) by mouth daily. 30  capsule 2  . furosemide (LASIX) 40 MG tablet Take 1 tablet (40 mg total) by mouth daily. 90 tablet 3  . lisinopril (PRINIVIL,ZESTRIL) 40 MG tablet Take 1 tablet (40 mg total) by mouth daily. 90 tablet 3  . metoprolol succinate (TOPROL-XL) 25 MG 24 hr tablet Take 0.5 tablets (12.5 mg total) by mouth daily. 45 tablet 3  . polyethylene glycol (MIRALAX / GLYCOLAX) packet Take 17 g by mouth daily as needed (for constipation.).    Marland Kitchen warfarin (COUMADIN) 5 MG tablet Take 1 tablet (5 mg total) by mouth daily. (Patient taking differently: Take 5 mg by mouth daily. Managed by Dr. Laurance Flatten) 30 tablet 6   No current facility-administered medications on file prior to visit.     Allergies:   Allergies  Allergen Reactions  . Actonel [Risedronate Sodium] Other (See Comments)    Esophagitis   . Diphenhydramine Hcl Hives  . Fosamax [Alendronate Sodium] Other (See Comments)    Esophagitis   . Livalo [Pitavastatin Calcium] Other (See Comments)    Pt doesn't remember reaction  . Pravastatin Other (See Comments)    Extreme leg cramping.  . Sulfa Antibiotics Other (See Comments)    Pt doesn't remember reaction  . Zocor [Simvastatin] Other (See Comments)    Pt doesn't remember reaction    Physical Exam General: Frail elderly Caucasian lady seated, in no evident distress Head: head normocephalic and atraumatic.   Neck: supple with no carotid or supraclavicular bruits Cardiovascular: regular rate and rhythm, no murmurs Musculoskeletal: no deformity Skin:  no rash/petichiae Vascular:  Normal pulses all extremities  Neurologic Exam Mental Status: Awake and fully alert. Oriented to place and time. Recent and remote memory Diminished Attention span, concentration and fund of knowledge diminished. Poor recall 1/3. Animal naming diminished 7 only.. Mood and affect appropriate. Mini-Mental status exam scored 21/30 with deficits in orientation, recall, calculation following three-step commands. Clock drawing  4/4. Cranial Nerves: Fundoscopic exam reveals sharp disc margins. Pupils equal, briskly reactive to light. Extraocular movements full without nystagmus. Visual fields full to confrontation. Hearing intact. Facial sensation intact. Face, tongue, palate moves normally and symmetrically.  Motor: Normal bulk and tone. Normal strength in all tested extremity muscles. Sensory.: intact to touch , pinprick , position and vibratory sensation.  Coordination: Rapid alternating movements normal in all extremities. Finger-to-nose and heel-to-shin performed accurately bilaterally. Gait and Station: Arises from chair without difficulty. Stance is normal. Gait demonstrates normal stride and mild imbalance. Retropulsion present.. Not able to heel, toe and tandem walk without difficulty.  Reflexes: 1+ and symmetric. Toes downgoing.       ASSESSMENT: 32 year Caucasian lady with history of 3 falls over the last 6 weeks likely multifactorial due to combination of known cerebellopontine angle tumor, vertebrobasilar insufficiencysilent left occipital infarct, cerebrovascular disease and mild dementia.    PLAN: I had a long discussion with the patient and her son regarding  the multiple neurological issues. 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 She also has long-standing memory loss and mild cognitive impairment. MRI scan showed a silent left occipital infarct likely from atrial fibrillation. I recommend checking lab work today for reversible causes of memory loss, EEG and CT angiogram of brain and neck. Patient is refusing medications like Aricept and Namenda and the present time. I recommend she continue anticoagulation with warfarin for secondary stroke prevention and maintain strict control of hypertension with blood pressure goal  below 140/90, lipids with LDL cholesterol goal below 70 mg percent. I have also given her fall and safety precautions. 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 Greater than 50% time during this prolonged 60 minute consultation visit was spent on counseling and coordination of care about falls, memory loss, meningioma and answered questions. Return for follow-up in 3 months or call earlier if necessary. Antony Contras, MD  [facility] [address] [place], [place] [address]  Phone [phone] Fax [phone] Note: This document was prepared with digital dictation and possible smart phrase technology. Any transcriptional errors that result from this process are unintentional.

## 2015-12-21 DIAGNOSIS — M6281 Muscle weakness (generalized): Secondary | ICD-10-CM | POA: Diagnosis not present

## 2015-12-21 DIAGNOSIS — I872 Venous insufficiency (chronic) (peripheral): Secondary | ICD-10-CM | POA: Diagnosis not present

## 2015-12-21 DIAGNOSIS — L97321 Non-pressure chronic ulcer of left ankle limited to breakdown of skin: Secondary | ICD-10-CM | POA: Diagnosis not present

## 2015-12-21 DIAGNOSIS — I10 Essential (primary) hypertension: Secondary | ICD-10-CM | POA: Diagnosis not present

## 2015-12-21 DIAGNOSIS — Z7901 Long term (current) use of anticoagulants: Secondary | ICD-10-CM | POA: Diagnosis not present

## 2015-12-21 DIAGNOSIS — I341 Nonrheumatic mitral (valve) prolapse: Secondary | ICD-10-CM | POA: Diagnosis not present

## 2015-12-21 LAB — DEMENTIA PANEL
Homocysteine: 17.5 umol/L — ABNORMAL HIGH (ref 0.0–15.0)
RPR: NONREACTIVE
TSH: 0.78 u[IU]/mL (ref 0.450–4.500)
Vitamin B-12: 460 pg/mL (ref 211–946)

## 2015-12-22 ENCOUNTER — Other Ambulatory Visit: Payer: Self-pay | Admitting: *Deleted

## 2015-12-22 ENCOUNTER — Telehealth: Payer: Self-pay | Admitting: Family Medicine

## 2015-12-22 ENCOUNTER — Encounter: Payer: Self-pay | Admitting: Thoracic Surgery (Cardiothoracic Vascular Surgery)

## 2015-12-22 ENCOUNTER — Institutional Professional Consult (permissible substitution) (INDEPENDENT_AMBULATORY_CARE_PROVIDER_SITE_OTHER): Payer: Medicare Other | Admitting: Thoracic Surgery (Cardiothoracic Vascular Surgery)

## 2015-12-22 VITALS — BP 111/66 | HR 76 | Resp 20 | Ht 64.0 in | Wt 134.0 lb

## 2015-12-22 DIAGNOSIS — I059 Rheumatic mitral valve disease, unspecified: Secondary | ICD-10-CM

## 2015-12-22 DIAGNOSIS — I63442 Cerebral infarction due to embolism of left cerebellar artery: Secondary | ICD-10-CM | POA: Insufficient documentation

## 2015-12-22 DIAGNOSIS — I639 Cerebral infarction, unspecified: Secondary | ICD-10-CM

## 2015-12-22 DIAGNOSIS — J3801 Paralysis of vocal cords and larynx, unilateral: Secondary | ICD-10-CM | POA: Diagnosis not present

## 2015-12-22 DIAGNOSIS — I080 Rheumatic disorders of both mitral and aortic valves: Secondary | ICD-10-CM | POA: Diagnosis not present

## 2015-12-22 DIAGNOSIS — I340 Nonrheumatic mitral (valve) insufficiency: Secondary | ICD-10-CM

## 2015-12-22 DIAGNOSIS — I7409 Other arterial embolism and thrombosis of abdominal aorta: Secondary | ICD-10-CM

## 2015-12-22 DIAGNOSIS — Z01818 Encounter for other preprocedural examination: Secondary | ICD-10-CM

## 2015-12-22 DIAGNOSIS — G450 Vertebro-basilar artery syndrome: Secondary | ICD-10-CM | POA: Insufficient documentation

## 2015-12-22 DIAGNOSIS — I481 Persistent atrial fibrillation: Secondary | ICD-10-CM

## 2015-12-22 DIAGNOSIS — I4819 Other persistent atrial fibrillation: Secondary | ICD-10-CM

## 2015-12-22 NOTE — Patient Instructions (Signed)
Continue all previous medications without any changes at this time  

## 2015-12-22 NOTE — Progress Notes (Signed)
[address]       [place] [address]             [phone]     CARDIOTHORACIC SURGERY CONSULTATION REPORT  Referring Provider is Minus Breeding, MD PCP is Redge Gainer, MD  Chief Complaint  Patient presents with  . Mitral Regurgitation    Surgical eval, ECHO 12/12/2015    HPI:  Patient is an 82 year old female with history of mitral valve prolapse and mitral regurgitation, hypertrophic cardiomyopathy, hypertension, chronic persistent atrial fibrillation on long-term warfarin anticoagulation, meningioma of the cerebellopontine angle, vertebral basilar insufficiency, silent embolic stroke noted on previous MRI, unilateral vocal cord paralysis, chronic memory loss, and degenerative arthritis with been referred for surgical consultation to discuss treatment options for management of severe symptomatic primary mitral regurgitation.  The patient has been followed for several years by Dr. Percival Spanish line for management of her mitral valve prolapse with mitral regurgitation and chronic persistent atrial fibrillation. Recent follow-up transthoracic echocardiogram suggested significant progression of disease with partially flail anterior leaflet and at least moderate to severe mitral regurgitation with moderate to severe tricuspid regurgitation and findings suggestive of pulmonary hypertension. She subsequently underwent transesophageal echocardiogram on 12/12/2015. This revealed normal left ventricular systolic function with mitral valve prolapse including flail segment of the anterior leaflet with evidence of ruptured chordae tendoneae. There was moderate thickening of the mitral valve leaflets with severe mitral regurgitation that was treated directed eccentrically and posteriorly around the left atrium. There was severe left atrial enlargement. There was mildly reduced right ventricular function and severe right atrial enlargement. There was moderate tricuspid regurgitation.  There were findings consistent with severe pulmonary hypertension.  The patient was referred for surgical consultation.  The patient is retired and lives with her husband in [place]. The husband is not physically well and was not able to accompany the patient for her visit in our office today. The patient is accompanied by her 2 sons who are very supportive.  The patient lives a sedentary lifestyle and has not been active physically for many years. She complains of exertional shortness of breath that has progressed over the past year. She now gets short of breath with minimal activity. She denies any history of resting shortness of breath. She occasionally has episodes of shortness of breath that awaken her from her sleep and are relieved by sitting up in bed. She has had dizzy spells without syncopal episodes. She has had no history of chest pain or chest tightness either with activity or at rest. The patient has had problems with a chronic cough that has been productive recently and exacerbated since she developed chronic hoarseness of voice. The patient's hoarseness of voice began nearly 6 months ago. She has been diagnosed with unilateral vocal cord paralysis. She was recently scheduled for vocal cord medialization at [facility] but this was canceled because of concerns regarding the patient's cardiac history.  Patient has also suffered several falls recently and has problems with poor balance. She has problems with short-term memory loss and chronic double vision. She was recently evaluated by Dr. Leonie Man who noted that follow-up MRI performed last May revealed findings consistent with small embolic stroke in the left cerebellum. The patient also has known chronic meningioma of the cerebellopontine angle that appeared stable on the patient's recent MRI. The patient has been referred to Dr. Ellene Route for surgical consultation regarding her meningioma.  The patient also has been scheduled  for  EEG and CT angiogram of the head and neck which remained pending at this time.  Past Medical History:  Diagnosis Date  . Alopecia   . Anxiety   . Arthritis   . Atrial fibrillation, persistent ([place]) 10/30/2015  . Cataract   . DDD (degenerative disc disease), cervical   . Diverticulosis of colon   . Esotropia   . History of double vision   . Hyperlipidemia   . Hypertension   . Hypertrophic cardiomyopathy ([place])    by history but not identified on the most recent echo  . Meningioma ([facility])    cerebellopontine angle   . Mild mitral valve prolapse   . Mitral insufficiency    Severe with anterior flail leaflet.    . Osteoporosis   . Postmenopausal HRT (hormone replacement therapy)   . 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 Vertebrobasilar insufficiency   . Vocal cord paralysis, unilateral complete     Past Surgical History:  Procedure Laterality Date  . ABDOMINAL HYSTERECTOMY    . CATARACT EXTRACTION, BILATERAL    . EYE SURGERY    . TEE WITHOUT CARDIOVERSION N/A 12/12/2015   Procedure: TRANSESOPHAGEAL ECHOCARDIOGRAM (TEE);  Surgeon: Lelon Perla, MD;  Location: [facility] ENDOSCOPY;  Service: Cardiovascular;  Laterality: N/A;  . TONSILLECTOMY    . VEIN SURGERY      Family History  Problem Relation Age of Onset  . Other Brother     IHSS  . COPD Brother   . Diabetes Brother   . Heart disease Brother   . Hyperlipidemia Brother   . Hypertension Brother   . Asthma Mother   . Other Sister     IHSS  . Other Sister     IHSS  . Cancer Cousin     thyroid    Social History   Social History  . Marital status: Married    Spouse name: N/A  . Number of children: N/A  . Years of education: N/A   Occupational History  . Not on file.   Social History Main Topics  . Smoking status: Never Smoker  . Smokeless tobacco: Never Used     Comment: Rare previously  . Alcohol use No  . Drug use: No  . Sexual activity: No   Other Topics  Concern  . Not on file   Social History Narrative  . No narrative on file    Current Outpatient Prescriptions  Medication Sig Dispense Refill  . amLODipine (NORVASC) 2.5 MG tablet Take 1 tablet (2.5 mg total) by mouth daily. 90 tablet 3  . Cholecalciferol (VITAMIN D3) 2000 UNITS TABS Take 2,000 Units by mouth daily.    . clobetasol cream (TEMOVATE) 0.05 % Apply topically 2 (two) times daily. 30 g 0  . DULoxetine (CYMBALTA) 30 MG capsule Take 1 capsule (30 mg total) by mouth daily. 30 capsule 2  . furosemide (LASIX) 40 MG tablet Take 1 tablet (40 mg total) by mouth daily. 90 tablet 3  . lisinopril (PRINIVIL,ZESTRIL) 40 MG tablet Take 1 tablet (40 mg total) by mouth daily. 90 tablet 3  . metoprolol succinate (TOPROL-XL) 25 MG 24 hr tablet Take 0.5 tablets (12.5 mg total) by mouth daily. 45 tablet 3  . polyethylene glycol (MIRALAX / GLYCOLAX) packet Take 17 g by mouth daily as needed (for constipation.).    Marland Kitchen warfarin (COUMADIN) 5 MG tablet Take 1 tablet (5 mg total) by mouth daily. (Patient taking  differently: Take 5 mg by mouth daily. Managed by Dr. Laurance Flatten) 30 tablet 6   No current facility-administered medications for this visit.     Allergies  Allergen Reactions  . Actonel [Risedronate Sodium] Other (See Comments)    Esophagitis   . Diphenhydramine Hcl Hives  . Fosamax [Alendronate Sodium] Other (See Comments)    Esophagitis   . Livalo [Pitavastatin Calcium] Other (See Comments)    Pt doesn't remember reaction  . Pravastatin Other (See Comments)    Extreme leg cramping.  . Sulfa Antibiotics Other (See Comments)    Pt doesn't remember reaction  . Zocor [Simvastatin] Other (See Comments)    Pt doesn't remember reaction      Review of Systems:   General:  poor appetite, decreased energy, no weight gain, no weight loss, no fever  Cardiac:  no chest pain with exertion, no chest pain at rest, +SOB with exertion, NO resting SOB, + PND, NO orthopnea, no palpitations, +  arrhythmia, + atrial fibrillation, no LE edema, + dizzy spells, no syncope  Respiratory:  + shortness of breath, no home oxygen, + productive cough, no dry cough, no bronchitis, no wheezing, no hemoptysis, no asthma, no pain with inspiration or cough, no sleep apnea, no CPAP at night  GI:   + difficulty swallowing, no reflux, no frequent heartburn, no hiatal hernia, no abdominal pain, no constipation, no diarrhea, no hematochezia, no hematemesis, no melena  GU:   no dysuria,  no frequency, + urinary tract infection, no hematuria, no kidney stones, no kidney disease  Vascular:  no pain suggestive of claudication, no pain in feet, + leg cramps, + varicose veins, no DVT, no non-healing foot ulcer  Neuro:   + stroke, ? TIA's, ? seizures, no headaches, no temporary blindness one eye,  no slurred speech, = peripheral neuropathy, NO chronic pain, NO instability of gait, + memory/cognitive dysfunction  Musculoskeletal: + arthritis, no joint swelling, no myalgias, some difficulty walking, limited mobility   Skin:   no rash, no itching, no skin infections, + pressure sores or ulcerations  Psych:   + anxiety, no depression, no nervousness, no unusual recent stress  Eyes:   + blurry vision, no floaters, + recent vision changes, + wears glasses or contacts  ENT:   + hearing loss, no loose or painful teeth, no dentures, last saw dentist within the past year  Hematologic:  + easy bruising, no abnormal bleeding, no clotting disorder, no frequent epistaxis  Endocrine:  no diabetes, does not check CBG's at home     Physical Exam:   BP 111/66 (BP Location: Left Arm, Patient Position: Sitting, Cuff Size: Small)   Pulse 76   Resp 20   Ht 5\' 4"  (1.626 m)   Wt 134 lb (60.8 kg)   LMP 08/01/1982   SpO2 98% Comment: RA  BMI 23.00 kg/m   General:  Elderly and somewhat frail-appearing  HEENT:  Unremarkable   Neck:   no JVD, no bruits, no adenopathy   Chest:   clear to auscultation, symmetrical breath sounds, no  wheezes, no rhonchi   CV:   Irregular rate and rhythm, grade IV/VI holosystolic murmur best at apex and left axilla  Abdomen:  soft, non-tender, no masses   Extremities:  warm, well-perfused, pulses diminished, no LE edema  Rectal/GU  Deferred  Neuro:   Grossly non-focal and symmetrical throughout  Skin:   Clean and dry, no rashes, no breakdown   Diagnostic Tests:  Transthoracic Echocardiography  Patient:  Patricia Villa, Patricia Villa MR #:       [id number] Study Date: 11/21/2015 Gender:     F Age:        29 Height:     162.6 cm Weight:     60.8 kg BSA:        1.66 m^2 Pt. Status: Room:   ORDERING     Minus Breeding, MD  REFERRING    Minus Breeding, MD  ATTENDING    Lyman Bishop MD  SONOGRAPHER  Marygrace Drought, RCS  PERFORMING   [facility], Outpatient  cc:  ------------------------------------------------------------------- LV EF: 60% -   65%  ------------------------------------------------------------------- Indications:      Mitral valve Disease (105.9).  ------------------------------------------------------------------- History:   PMH:  hyperlipidemia.  Atrial fibrillation.  Mitral valve prolapse.  Risk factors:  Hypertension.  ------------------------------------------------------------------- Study Conclusions  - Left ventricle: The cavity size was normal. Wall thickness was   increased in a pattern of mild LVH. There was moderate focal   basal hypertrophy of the septum. Systolic function was normal.   The estimated ejection fraction was in the range of 60% to 65%.   Wall motion was normal; there were no regional wall motion   abnormalities. The study is not technically sufficient to allow   evaluation of LV diastolic function. - Aortic valve: Trileaflet. Sclerosis without stenosis. There was   mild regurgitation. - Mitral valve: Calcified annulus. Mildly thickened leaflets . SAM   is noted. Prolapse of the anterior leaflet and mild tethering of   the posterior  leaflet. There appears to be a partially flail   subvalvular cord attached to the anterior leaflet tip. There was   moderate to severe, posteriorly directed and eccentric   regurgitation the hugs the RA free wall. - Left atrium: Severely dilated. - Right ventricle: The cavity size was mildly dilated. Systolic   function is reduced. - Right atrium: The atrium was mildly dilated. - Atrial septum: No defect or patent foramen ovale was identified. - Tricuspid valve: There was moderate regurgitation. - Pulmonic valve: Poorly visualized. There was moderate   regurgitation. - Pulmonary arteries: The main pulmonary artery is dilated. PA peak   pressure: 84 mm Hg (S). - Inferior vena cava: The vessel was normal in size. The   respirophasic diameter changes were in the normal range (= 50%),   consistent with normal central venous pressure.  Impressions:  - Compared to the prior study in 11/2014, there is now moderate to   severe eccentric MR. Anterior leaflet prolapse with a partially   flail cord is noted. RVSP is significantly elevated at 84 mmHg   with at least moderate TR.  ------------------------------------------------------------------- Study data:  Comparison was made to the study of 11/17/2014.  Study status:  Routine.  Procedure:  The patient reported no pain pre or post test. Transthoracic echocardiography. Image quality was adequate.          Transthoracic echocardiography.  M-mode, complete 2D, spectral Doppler, and color Doppler.  Birthdate: Patient birthdate: Dec 27, 1932.  Age:  Patient is 82 yr old.  Sex: Gender: female.    BMI: 23 kg/m^2.  Blood pressure:     113/74 Patient status:  Outpatient.  Study date:  Study date: 11/21/2015. Study time: 11:40 AM.  Location:  [facility]  -------------------------------------------------------------------  ------------------------------------------------------------------- Left ventricle:  The cavity size was normal. Wall  thickness was increased in a pattern of mild LVH. There was moderate focal basal hypertrophy of the septum. Systolic function was normal. The  estimated ejection fraction was in the range of 60% to 65%. Wall motion was normal; there were no regional wall motion abnormalities. The study is not technically sufficient to allow evaluation of LV diastolic function.  ------------------------------------------------------------------- Aortic valve:   Trileaflet. Sclerosis without stenosis.  Doppler: There was mild regurgitation.  ------------------------------------------------------------------- Aorta:  Aortic root: The aortic root was normal in size. Ascending aorta: The ascending aorta was normal in size.  ------------------------------------------------------------------- Mitral valve:   Calcified annulus. Mildly thickened leaflets . SAM is noted. Prolapse of the anterior leaflet and mild tethering of the posterior leaflet. There appears to be a partially flail subvalvular cord attached to the anterior leaflet tip.  Doppler: There was moderate to severe, posteriorly directed and eccentric regurgitation the hugs the RA free wall.    Peak gradient (D): 5 mm Hg.  ------------------------------------------------------------------- Left atrium:  Severely dilated.  ------------------------------------------------------------------- Atrial septum:  No defect or patent foramen ovale was identified.   ------------------------------------------------------------------- Right ventricle:  The cavity size was mildly dilated. Systolic function is reduced.  ------------------------------------------------------------------- Pulmonic valve:   Poorly visualized.  Doppler:  There was moderate regurgitation.  ------------------------------------------------------------------- Tricuspid valve:   Doppler:  There was moderate  regurgitation.  ------------------------------------------------------------------- Pulmonary artery:   The main pulmonary artery is dilated.  ------------------------------------------------------------------- Right atrium:  The atrium was mildly dilated.  ------------------------------------------------------------------- Pericardium:  There was no pericardial effusion.  ------------------------------------------------------------------- Systemic veins: Inferior vena cava: The vessel was normal in size. The respirophasic diameter changes were in the normal range (= 50%), consistent with normal central venous pressure. Diameter: 18 mm.  ------------------------------------------------------------------- Measurements   IVC                                         Value        Reference  ID                                          18    mm     ---------    Left ventricle                              Value        Reference  LV ID, ED, PLAX chordal                     43.1  mm     43 - 52  LV ID, ES, PLAX chordal                     26.9  mm     23 - 38  LV fx shortening, PLAX chordal              38    %      >=29  LV PW thickness, ED                         14.5  mm     ---------  IVS/LV PW ratio, ED                         0.82         <=1.3  Stroke volume, 2D                           38    ml     ---------  Stroke volume/bsa, 2D                       23    ml/m^2 ---------  LV ejection fraction, 1-p A4C               54    %      ---------  LV e&', lateral                              13.6  cm/s   ---------  LV E/e&', lateral                            7.87         ---------  LV e&', medial                               3.37  cm/s   ---------  LV E/e&', medial                             31.75        ---------  LV e&', average                              8.49  cm/s   ---------  LV E/e&', average                            12.61        ---------    Ventricular septum                           Value        Reference  IVS thickness, ED                           11.9  mm     ---------    LVOT                                        Value        Reference  LVOT ID, S                                  18    mm     ---------  LVOT area                                   2.54  cm^2   ---------  LVOT peak velocity, S                       77.6  cm/s   ---------  LVOT mean velocity, S  59.1  cm/s   ---------  LVOT VTI, S                                 15.1  cm     ---------    Aortic valve                                Value        Reference  Aortic regurg pressure half-time            484   ms     ---------    Aorta                                       Value        Reference  Aortic root ID, ED                          30    mm     ---------    Left atrium                                 Value        Reference  LA ID, A-P, ES                              44    mm     ---------  LA ID/bsa, A-P                      (H)     2.65  cm/m^2 <=2.2  LA volume, S                                81.1  ml     ---------  LA volume/bsa, S                            48.8  ml/m^2 ---------  LA volume, ES, 1-p A4C                      70.1  ml     ---------  LA volume/bsa, ES, 1-p A4C                  42.2  ml/m^2 ---------  LA volume, ES, 1-p A2C                      93.6  ml     ---------  LA volume/bsa, ES, 1-p A2C                  56.3  ml/m^2 ---------    Mitral valve                                Value        Reference  Mitral E-wave peak velocity  107   cm/s   ---------  Mitral deceleration time                    180   ms     150 - 230  Mitral peak gradient, D                     5     mm Hg  ---------    Pulmonary arteries                          Value        Reference  PA pressure, S, DP                  (H)     84    mm Hg  <=30    Tricuspid valve                             Value        Reference  Tricuspid regurg peak  velocity              436   cm/s   ---------  Tricuspid peak RV-RA gradient               76    mm Hg  ---------  Tricuspid maximal regurg velocity,          436   cm/s   ---------  PISA    Systemic veins                              Value        Reference  Estimated CVP                               8     mm Hg  ---------    Right ventricle                             Value        Reference  TAPSE                                       14.3  mm     ---------  RV pressure, S, DP                  (H)     84    mm Hg  <=30  RV s&', lateral, S                           9.46  cm/s   ---------  Legend: (L)  and  (H)  mark values outside specified reference range.  ------------------------------------------------------------------- Prepared and Electronically Authenticated by  Lyman Bishop MD [id number]:49:38    Transesophageal Echocardiography  Patient:    Patricia Villa, Patricia Villa MR #:       [id number] Study Date: 12/12/2015 Gender:     F Age:        82 Height:     162.6 cm Weight:     62.7 kg BSA:  1.69 m^2 Pt. Status: Room:   [place] Crenshaw  ATTENDING    Kathlen Mody, [place], Scott T  REFERRING    Richardson Dopp T  SONOGRAPHER  Johny Chess, RDCS, CCT  cc:  ------------------------------------------------------------------- LV EF: 55% -   60%  ------------------------------------------------------------------- Indications:      Mitral regurgitation 424.0.  ------------------------------------------------------------------- Study Conclusions  - Left ventricle: Systolic function was normal. The estimated   ejection fraction was in the range of 55% to 60%. Wall motion was   normal; there were no regional wall motion abnormalities. - Aortic valve: No evidence of vegetation. There was mild   regurgitation. - Mitral valve: Moderate thickening. Severe flail motion involving   the anterior  leaflet due to rupture of one or more chords. No   evidence of vegetation. There was severe regurgitation directed   eccentrically, posteriorly, and toward the free wall. - Left atrium: The atrium was severely dilated. No evidence of   thrombus in the atrial cavity or appendage. - Right ventricle: Systolic function was mildly reduced. - Right atrium: The atrium was severely dilated. - Atrial septum: No defect or patent foramen ovale was identified. - Tricuspid valve: No evidence of vegetation. There was moderate   regurgitation. - Pulmonic valve: No evidence of vegetation. - Pulmonary arteries: Systolic pressure was severely increased.  Impressions:  - Normal LV systolic function; severe biatrial enlargment; mildly   reduced RV function; sclerotic aortic valve with mild AI;   moderately thickened MV with ruptured chord/flail anterior   leaflet and mild prolapse of posterior leaflet; severe, posterior   and lateral directed MR; mild to moderate PI; moderate TR with   severely elevated pulmonary pressure (TR velocity 3.7 m/s).  ------------------------------------------------------------------- Study data:   Study status:  Routine.  Consent:  The risks, benefits, and alternatives to the procedure were explained to the patient and informed consent was obtained.  Procedure:  Initial setup. The patient was brought to the laboratory. Surface ECG leads were monitored. Sedation. Conscious sedation was administered by cardiology staff. Transesophageal echocardiography. An adult multiplane transesophageal probe was inserted by the attending cardiologistwithout difficulty. Image quality was adequate.  Study completion:  The patient tolerated the procedure well. There were no complications.  Administered medications:   Midazolam, 2mg , IV. Fentanyl, 19mcg, IV.          Diagnostic transesophageal echocardiography.  2D and color Doppler.  Birthdate:  Patient birthdate: [birth year].  Age:   Patient is 82 yr old.  Sex:  Gender: female.    BMI: 23.7 kg/m^2.  Blood pressure:     146/78  Patient status:  Outpatient.  Study date:  Study date: 12/12/2015. Study time: 10:56 AM.  Location:  Endoscopy.  -------------------------------------------------------------------  ------------------------------------------------------------------- Left ventricle:  Systolic function was normal. The estimated ejection fraction was in the range of 55% to 60%. Wall motion was normal; there were no regional wall motion abnormalities.  ------------------------------------------------------------------- Aortic valve:   Mildly thickened leaflets. Cusp separation was normal.  No evidence of vegetation.  Doppler:  There was mild regurgitation.  ------------------------------------------------------------------- Aorta:  Descending aorta: The descending aorta had mild diffuse disease.  ------------------------------------------------------------------- Mitral valve:   Moderate thickening. Leaflet separation was normal.  Severe flail motion involving the anterior leaflet due to rupture of one or more chords.  No evidence of vegetation.  Doppler:  There was severe regurgitation directed eccentrically, posteriorly, and toward the free wall.  -------------------------------------------------------------------  Left atrium:  The atrium was severely dilated.  No evidence of thrombus in the atrial cavity or appendage.  ------------------------------------------------------------------- Atrial septum:  No defect or patent foramen ovale was identified.   ------------------------------------------------------------------- Right ventricle:  The cavity size was normal. Systolic function was mildly reduced.  ------------------------------------------------------------------- Pulmonic valve:    Structurally normal valve.   Cusp separation was normal.  No evidence of vegetation.  Doppler:  There was  mild to moderate regurgitation.  ------------------------------------------------------------------- Tricuspid valve:   Structurally normal valve.   Leaflet separation was normal.  No evidence of vegetation.  Doppler:  There was moderate regurgitation.  ------------------------------------------------------------------- Pulmonary artery:   Systolic pressure was severely increased.  ------------------------------------------------------------------- Right atrium:  The atrium was severely dilated.  ------------------------------------------------------------------- Pericardium:  There was no pericardial effusion.   ------------------------------------------------------------------- Prepared and Electronically Authenticated by  Kirk Ruths 2017-08-08T12:06:57    MRI HEAD WITHOUT AND WITH CONTRAST  TECHNIQUE: Multiplanar, multiecho pulse sequences of the brain and surrounding structures were obtained without and with intravenous contrast.  CONTRAST:  67mL MULTIHANCE GADOBENATE DIMEGLUMINE 529 MG/ML IV SOLN  COMPARISON:  MRI 09/08/2014  FINDINGS: Enhancing mass along the left features for age is unchanged. This is posterior to the internal auditory canal and shows broad dural base and most consistent with meningioma. This measures 15 x 14 mm in size. No edema in the adjacent cerebellum.  Generalized atrophy.  Negative for hydrocephalus  Negative for acute infarct. Moderate chronic microvascular ischemic changes in the white matter. Mild chronic ischemia in the pons.  Negative for hemorrhage or fluid collection.  Linear cortical enhancement in the left occipital cortex likely an area of subacute infarct. This does not show restricted diffusion at this time but may be several weeks old. Correlate with any history of of stroke.  IMPRESSION: Left P2 is reach meningioma unchanged  Cortical enhancement left occipital lobe most consistent with subacute  infarct measuring approximately 1 cm in size.   Electronically Signed   By: Franchot Gallo M.D.   On: 09/28/2015 13:28    CT HEAD WITHOUT CONTRAST TECHNIQUE: Contiguous axial images were obtained from the base of the skull through the vertex without intravenous contrast. COMPARISON:  Brain MRI, 09/28/2015.  Head CT, 10/12/2007. FINDINGS: The ventricles are normal in configuration. There is age related, mild, ventricular and sulcal enlargement. No hydrocephalus. There are no parenchymal masses or mass effect. There is no evidence of a recent cortical infarct. Patchy white matter hypoattenuation is noted consistent with moderate chronic microvascular ischemic change, without change from the more recent prior brain MRI. The left cerebral pontine angle meningioma is without significant change from the prior exams, heavily calcified. No other extra-axial abnormalities. There is no intracranial hemorrhage. The visualized sinuses and mastoid air cells are clear. IMPRESSION: 1. No acute intracranial abnormalities. 2. Moderate chronic bypass ischemic change. 3. Stable left cerebellopontine angle meningioma. Electronically Signed   By: Lajean Manes M.D.   On: 11/30/2015 14:18  Impression:  Patient has stage D severe symptomatic primary mitral regurgitation, chronic persistent atrial fibrillation, and long-standing hypertension with reported history of hypertrophic cardiomyopathy. She is quite frail and not active physically but she describes progressive symptoms of exertional shortness of breath, fatigue, and PND consistent with chronic diastolic congestive heart failure, New York Heart Association functional class III. I have personally reviewed the patient's recent transthoracic and transesophageal echocardiograms. She has myxomatous degenerative disease with at least moderate thickening of both leaflets of the mitral valve and an obvious flail segment of  the anterior leaflet with  ruptured primary chordae tendineae.  There is severe mitral regurgitation with dilated left atrium, dilated right ventricle, moderate tricuspid regurgitation, and findings suggestive of severe pulmonary hypertension.  Left ventricular systolic function appears normal although she likely has significant diastolic dysfunction.  Diagnostic cardiac catheterization has not yet been performed.  I agree the patient would best be treated with mitral valve repair or replacement, Maze procedure, and possible tricuspid valve repair. However, I am concerned that risks associated with conventional surgery might be relatively high even if approached using minimally invasive techniques because of the patient's numerous comorbid medical problems and significant physical deconditioning. I am particularly concerned regarding the patient's ongoing neurologic problems with chronic memory loss, double vision, recently developed unilateral vocal cord paralysis, embolic stroke, and meningioma.  It might be reasonable to consider referral for palliative edge to edge MitraClip repair of the mitral valve if subsequent workup suggests that the risks associated with conventional surgery might be prohibitive.    Plan:  I have discussed matters at length with the patient and her 2 sons in the office today. The indications, risks, and potential benefits of mitral valve repair or replacement have been discussed at length. Alternative treatment strategies such as long-term palliative medical therapy and palliative edge to edge mitral clip repair have been discussed.  Long-term prognosis with medical therapy alone has been discussed.  The patient and her family remain interested in pursuing an aggressive approach to her care at this time.  As a next step she will need to undergo left and right heart catheterization. In addition we will refer the patient for a formal physical therapy evaluation including frailty assessment and 6 minute walk  test. We will await results from the EEG and CT angiogram of the head and neck recently ordered by Dr. Leonie Man and the consultation scheduled with Dr. Ellene Route.  The patient will return to our office in approximately 4 weeks to review the results of her various diagnostic tests and consultations. All of their questions have been addressed.   I spent in excess of 90 minutes during the conduct of this office consultation and >50% of this time involved direct face-to-face encounter with the patient for counseling and/or coordination of their care.    Valentina Gu. Roxy Manns, MD 12/22/2015 3:57 PM

## 2015-12-25 ENCOUNTER — Ambulatory Visit (INDEPENDENT_AMBULATORY_CARE_PROVIDER_SITE_OTHER): Payer: Medicare Other | Admitting: Pharmacist

## 2015-12-25 DIAGNOSIS — I341 Nonrheumatic mitral (valve) prolapse: Secondary | ICD-10-CM | POA: Diagnosis not present

## 2015-12-25 DIAGNOSIS — I481 Persistent atrial fibrillation: Secondary | ICD-10-CM

## 2015-12-25 DIAGNOSIS — L97321 Non-pressure chronic ulcer of left ankle limited to breakdown of skin: Secondary | ICD-10-CM | POA: Diagnosis not present

## 2015-12-25 DIAGNOSIS — Z7901 Long term (current) use of anticoagulants: Secondary | ICD-10-CM

## 2015-12-25 DIAGNOSIS — I872 Venous insufficiency (chronic) (peripheral): Secondary | ICD-10-CM | POA: Diagnosis not present

## 2015-12-25 DIAGNOSIS — I480 Paroxysmal atrial fibrillation: Secondary | ICD-10-CM | POA: Diagnosis not present

## 2015-12-25 DIAGNOSIS — I4819 Other persistent atrial fibrillation: Secondary | ICD-10-CM

## 2015-12-25 DIAGNOSIS — M6281 Muscle weakness (generalized): Secondary | ICD-10-CM | POA: Diagnosis not present

## 2015-12-25 DIAGNOSIS — I10 Essential (primary) hypertension: Secondary | ICD-10-CM | POA: Diagnosis not present

## 2015-12-25 LAB — COAGUCHEK XS/INR WAIVED
INR: 2 — ABNORMAL HIGH (ref 0.9–1.1)
Prothrombin Time: 23.6 s

## 2015-12-26 ENCOUNTER — Ambulatory Visit (INDEPENDENT_AMBULATORY_CARE_PROVIDER_SITE_OTHER): Payer: Medicare Other

## 2015-12-26 ENCOUNTER — Ambulatory Visit: Payer: Medicare Other | Attending: Thoracic Surgery (Cardiothoracic Vascular Surgery) | Admitting: Physical Therapy

## 2015-12-26 ENCOUNTER — Encounter: Payer: Self-pay | Admitting: Physical Therapy

## 2015-12-26 DIAGNOSIS — R410 Disorientation, unspecified: Secondary | ICD-10-CM

## 2015-12-26 DIAGNOSIS — R2689 Other abnormalities of gait and mobility: Secondary | ICD-10-CM | POA: Insufficient documentation

## 2015-12-26 DIAGNOSIS — W19XXXA Unspecified fall, initial encounter: Secondary | ICD-10-CM

## 2015-12-26 DIAGNOSIS — G3184 Mild cognitive impairment, so stated: Secondary | ICD-10-CM

## 2015-12-26 DIAGNOSIS — R293 Abnormal posture: Secondary | ICD-10-CM

## 2015-12-26 DIAGNOSIS — I639 Cerebral infarction, unspecified: Secondary | ICD-10-CM

## 2015-12-26 NOTE — Therapy (Signed)
[place], [place], [address] Phone: [phone]   Fax:  [phone]  Physical Therapy Evaluation  Patient Details  Name: Patricia Villa MRN: [id number] Date of Birth: Aug 30, 1932 Referring Provider: Dr. Darylene Price  Encounter Date: 12/26/2015      PT End of Session - 12/26/15 1341    Visit Number 1   PT Start Time [id number]   PT Stop Time 1200   PT Time Calculation (min) 55 min   Equipment Utilized During Treatment Gait belt      Past Medical History:  Diagnosis Date  . Alopecia   . Anxiety   . Arthritis   . Atrial fibrillation, persistent ([place]) 10/30/2015  . Cataract   . DDD (degenerative disc disease), cervical   . Diverticulosis of colon   . Esotropia   . History of double vision   . Hyperlipidemia   . Hypertension   . Hypertrophic cardiomyopathy ([place])    by history but not identified on the most recent echo  . Meningioma ([facility])    cerebellopontine angle   . Mild mitral valve prolapse   . Mitral insufficiency    Severe with anterior flail leaflet.    . Osteoporosis   . Postmenopausal HRT (hormone replacement therapy)   . 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 Vertebrobasilar insufficiency   . Vocal cord paralysis, unilateral complete     Past Surgical History:  Procedure Laterality Date  . ABDOMINAL HYSTERECTOMY    . CATARACT EXTRACTION, BILATERAL    . EYE SURGERY    . TEE WITHOUT CARDIOVERSION N/A 12/12/2015   Procedure: TRANSESOPHAGEAL ECHOCARDIOGRAM (TEE);  Surgeon: Lelon Perla, MD;  Location[facility] ENDOSCOPY;  Service: Cardiovascular;  Laterality: N/A;  . TONSILLECTOMY    . VEIN SURGERY      There were no vitals filed for this visit.       Subjective Assessment - 12/26/15 1113    Subjective progressive exertional shortness of breath over the past year   Pertinent History chronic meningioma of cerebellopontine angle - pending  surgical consultation with Dr. Ellene Route, hx CVA, hx falls/poor balance   Pain Score 0-No pain            [facility] PT Assessment - 12/26/15 0001      Assessment   Medical Diagnosis mitral regurgitation   Referring Provider Dr. Darylene Price   Onset Date/Surgical Date 12/22/15     Precautions   Precautions Fall   Precaution Comments no driving     Restrictions   Weight Bearing Restrictions No     Balance Screen   Has the patient fallen in the past 6 months Yes   How many times? 3  all in the house and lost balance   Has the patient had a decrease in activity level because of a fear of falling?  No   Is the patient reluctant to leave their home because of a fear of falling?  No     Home Environment   Living Environment Private residence   Living Arrangements Spouse/significant other   Type of [place] Access Level entry  to den, 3 steps to kitchen and rest of house, rail on [place] level  basement, but doesn't have to access   Additional Comments helps husband with meals, recently started with housekeeper that helps some with household responsbilites and transportation  Prior Function   Level of Independence Independent with household mobility without device     Posture/Postural Control   Posture/Postural Control Postural limitations   Postural Limitations Rounded Shoulders;Forward head;Posterior pelvic tilt     ROM / Strength   AROM / PROM / Strength AROM;Strength     AROM   Overall AROM Comments grossly WNL throughout     Strength   Overall Strength Comments grossly 4/5 throughout   Strength Assessment Site Hand   Right/Left hand Right;Left   Right Hand Grip (lbs) 29  R hand dominant   Left Hand Grip (lbs) 20     Ambulation/Gait   Gait Pattern Narrow base of support;Poor foot clearance - left;Poor foot clearance - right   Gait Comments Pt demonstrated imbalance at times with turning and poor balance recovery strategies during balance  testing.           [facility] Pre-Surgical Assessment - 12/26/15 0001    5 Meter Walk Test- trial 1 7 sec   5 Meter Walk Test- trial 2 7 sec.    5 Meter Walk Test- trial 3 6 sec.  > 6 sec indicative of slow speed   5 meter walk test average 6.67 sec   Timed Up & Go Test trial  14 sec.   Comments > 12 seconds indicates increased fall risk   4 Stage Balance Test tolerated for:  4 sec.   4 Stage Balance Test Position 3  able to do 10 seconds on other side but still had LOB    comment indicates increased fall risk   Sit To Stand Test- trial 1 24 sec.  1/10 on modified Borg rating scale for dyspnea   ADL/IADL Independent with: Bathing;Dressing;Meal prep;Finances   ADL/IADL Needs Assistance with: [employer] Leaver work   ADL/IADL Therapist, sports Index Midly frail   Other comment based on comorbidities   6 Minute Walk- Baseline yes   BP (mmHg) 118/58   HR (bpm) 70   02 Sat (%RA) 93 %   Modified Borg Scale for Dyspnea 0- Nothing at all   Perceived Rate of Exertion (Borg) 6-   6 Minute Walk Post Test yes   BP (mmHg) 120/78   HR (bpm) 92   02 Sat (%RA) 94 %   Modified Borg Scale for Dyspnea 5- Strong or hard breathing   Perceived Rate of Exertion (Borg) 13- Somewhat hard   Aerobic Endurance Distance Walked 555  limited by 43% of normal value for age/gender   Endurance additional comments Pt required standing rest briefly at 4:40 and then able to ambulate 20' to reach chair and remain seated around the 5 minute mark. Unable to resume after that point.                                      Plan - 12/26/15 1342    Clinical Impression Statement Pt is an 82 yo female presenting for physical therapy evaluation prior to possible MVR surgery due to mitrial regurgitation. Pt is present with her youngest son, Abbe Amsterdam, today. Pt reports a history of worsening shortness of breath over the past year. Pt has several other active medical issues at this time and is still undergoing testing/consults  per referring MD note. Pt also has a history of 3 falls in the last 6 months. Pt presents with good strength overall, fair to poor balance with testing indicating increased fall risk, slow walking  speed, and poor aerobic endurance.  6 minute walk test was limited by 43% of the norm for age/gender and diastolic BP increased signficantly with testing.    PT Frequency One time visit   Consulted and Agree with Plan of Care Patient;Family member/caregiver      Patient will benefit from skilled therapeutic intervention in order to improve the following deficits and impairments:     Visit Diagnosis: Other abnormalities of gait and mobility - Plan: PT plan of care cert/re-cert  Abnormal posture - Plan: PT plan of care cert/re-cert      G-Codes - [id number]    Functional Assessment Tool Used 6 minute walk 555'   Functional Limitation Mobility: Walking and moving around   Mobility: Walking and Moving Around Current Status [phone]) At least 40 percent but less than 60 percent impaired, limited or restricted   Mobility: Walking and Moving Around Goal Status [phone]) At least 40 percent but less than 60 percent impaired, limited or restricted   Mobility: Walking and Moving Around Discharge Status [phone]) At least 40 percent but less than 60 percent impaired, limited or restricted       Problem List Patient Active Problem List   Diagnosis Date Noted  . Vocal cord paralysis, unilateral complete   . Vertebrobasilar insufficiency   . Stroke due to embolism of left cerebellar artery ([place])   . Aortic atherosclerosis ([place]) 11/20/2015  . Atrial fibrillation, persistent ([place]) 10/30/2015  . Atrial fibrillation ([place]) 11/23/2014  . Osteoporosis 08/31/2014  . Chronic neck pain 06/07/2014  . DDD (degenerative disc disease), cervical 01/24/2014  . Hyperlipidemia 05/17/2013  . Colitis 01/20/2013  . Generalized anxiety disorder 08/05/2012  . Meningioma ([place]) 08/05/2012  . Bruit 10/17/2010  . Essential  hypertension, benign 10/04/2009  . MITRAL INSUFFICIENCY 09/26/2009  . Mitral valve disorder 09/26/2009  . Hypertrophic obstructive cardiomyopathy ([place]) 09/26/2009    [place], PT 12/26/2015, 2:06 PM  [facility] [address] [place], [place], [address] Phone: [phone]   Fax:  [phone]  Name: DONIQUE HOGENSON MRN: [id number] Date of Birth: 16-Sep-1932

## 2015-12-27 ENCOUNTER — Telehealth: Payer: Self-pay | Admitting: Pharmacist

## 2015-12-27 ENCOUNTER — Telehealth: Payer: Self-pay

## 2015-12-27 MED ORDER — ENOXAPARIN SODIUM 60 MG/0.6ML ~~LOC~~ SOLN: 60.0000 mg | Freq: Two times a day (BID) | SUBCUTANEOUS | 0 refills | Status: DC

## 2015-12-27 NOTE — Telephone Encounter (Signed)
Received call from [place] from [facility].  Patricia Villa saw Dr Burt Knack today.  Cardiac Cath is planned for next week 02/02/16. Her last warfarin dose will be tonight and she will hold until told to restart after procedure.  Due to history of stroke Dr Burt Knack is recommending Lovenox Bridging with last dose on Monday morning 02/01/16.   Current weight is 61kg.  Recommend Lovenox 60mg  bid starting 01/29/2016 in pm.    Warfarin  Enoxaparin / Lovenox  Wed, August 23rd Last dose none  Thurs, August 24th none none  Friday, August 25th none Inject Enoxaparin 60mg  in evening only  Saturday, August 26th none Inject enoxaparin 60mg  in morning and evening  Sunday, August 27th none Inject enoxaparin 60mg  in morning and evening  Monday, August 28th none Inject enoxaparin 60mg  in morning only  Tuesday, August 29th Day of Procedure none none   Warfarin and enoxaparin will be restarted depending on if any intervention needed.   Patient's son Patricia Villa notified of above and copy of plan left for him to pick up.  Patricia Villa has given SQ injections in past and feels comfortable administering to Patricia Villa but he is instructed to call office if he has any questions.

## 2015-12-27 NOTE — Telephone Encounter (Signed)
Dr Burt Knack reviewed this pt's chart in preparation for cardiac catheterization on 01/02/16.  Due to history of stroke he would like the pt to be bridged with lovenox.  The pt will take last dosage of warfarin today and begin holding tomorrow.  Dr Burt Knack would also like the last dose of lovenox to be given on Monday morning. I contacted Tammy Eckard Pharm-D and made her aware of these instructions.  She will follow up with the pt to handle lovenox bridging.

## 2015-12-28 DIAGNOSIS — I10 Essential (primary) hypertension: Secondary | ICD-10-CM | POA: Diagnosis not present

## 2015-12-28 DIAGNOSIS — Z7901 Long term (current) use of anticoagulants: Secondary | ICD-10-CM | POA: Diagnosis not present

## 2015-12-28 DIAGNOSIS — I341 Nonrheumatic mitral (valve) prolapse: Secondary | ICD-10-CM | POA: Diagnosis not present

## 2015-12-28 DIAGNOSIS — L97321 Non-pressure chronic ulcer of left ankle limited to breakdown of skin: Secondary | ICD-10-CM | POA: Diagnosis not present

## 2015-12-28 DIAGNOSIS — I872 Venous insufficiency (chronic) (peripheral): Secondary | ICD-10-CM | POA: Diagnosis not present

## 2015-12-28 DIAGNOSIS — M6281 Muscle weakness (generalized): Secondary | ICD-10-CM | POA: Diagnosis not present

## 2015-12-30 DIAGNOSIS — M6281 Muscle weakness (generalized): Secondary | ICD-10-CM | POA: Diagnosis not present

## 2015-12-30 DIAGNOSIS — I341 Nonrheumatic mitral (valve) prolapse: Secondary | ICD-10-CM | POA: Diagnosis not present

## 2015-12-30 DIAGNOSIS — Z7901 Long term (current) use of anticoagulants: Secondary | ICD-10-CM | POA: Diagnosis not present

## 2015-12-30 DIAGNOSIS — I872 Venous insufficiency (chronic) (peripheral): Secondary | ICD-10-CM | POA: Diagnosis not present

## 2015-12-30 DIAGNOSIS — I10 Essential (primary) hypertension: Secondary | ICD-10-CM | POA: Diagnosis not present

## 2016-01-01 DIAGNOSIS — D320 Benign neoplasm of cerebral meninges: Secondary | ICD-10-CM | POA: Diagnosis not present

## 2016-01-02 ENCOUNTER — Ambulatory Visit (HOSPITAL_COMMUNITY)
Admission: RE | Admit: 2016-01-02 | Discharge: 2016-01-02 | Disposition: A | Discharge status: Home / Self Care | Payer: Medicare Other | Source: Ambulatory Visit | Attending: Cardiovascular Disease | Admitting: Cardiovascular Disease

## 2016-01-02 ENCOUNTER — Encounter (HOSPITAL_COMMUNITY)
Admission: RE | Disposition: A | Discharge status: Home / Self Care | Payer: Self-pay | Source: Ambulatory Visit | Attending: Cardiovascular Disease

## 2016-01-02 DIAGNOSIS — I2510 Atherosclerotic heart disease of native coronary artery without angina pectoris: Secondary | ICD-10-CM | POA: Diagnosis not present

## 2016-01-02 DIAGNOSIS — E785 Hyperlipidemia, unspecified: Secondary | ICD-10-CM | POA: Diagnosis not present

## 2016-01-02 DIAGNOSIS — I081 Rheumatic disorders of both mitral and tricuspid valves: Secondary | ICD-10-CM | POA: Diagnosis not present

## 2016-01-02 DIAGNOSIS — Z79899 Other long term (current) drug therapy: Secondary | ICD-10-CM | POA: Insufficient documentation

## 2016-01-02 DIAGNOSIS — I340 Nonrheumatic mitral (valve) insufficiency: Secondary | ICD-10-CM | POA: Diagnosis not present

## 2016-01-02 DIAGNOSIS — Z8673 Personal history of transient ischemic attack (TIA), and cerebral infarction without residual deficits: Secondary | ICD-10-CM | POA: Insufficient documentation

## 2016-01-02 DIAGNOSIS — Z8249 Family history of ischemic heart disease and other diseases of the circulatory system: Secondary | ICD-10-CM | POA: Diagnosis not present

## 2016-01-02 DIAGNOSIS — I422 Other hypertrophic cardiomyopathy: Secondary | ICD-10-CM | POA: Insufficient documentation

## 2016-01-02 DIAGNOSIS — I10 Essential (primary) hypertension: Secondary | ICD-10-CM | POA: Diagnosis not present

## 2016-01-02 DIAGNOSIS — I481 Persistent atrial fibrillation: Secondary | ICD-10-CM | POA: Diagnosis not present

## 2016-01-02 DIAGNOSIS — I080 Rheumatic disorders of both mitral and aortic valves: Secondary | ICD-10-CM | POA: Diagnosis present

## 2016-01-02 DIAGNOSIS — M1990 Unspecified osteoarthritis, unspecified site: Secondary | ICD-10-CM | POA: Insufficient documentation

## 2016-01-02 DIAGNOSIS — I482 Chronic atrial fibrillation: Secondary | ICD-10-CM | POA: Insufficient documentation

## 2016-01-02 DIAGNOSIS — Z7901 Long term (current) use of anticoagulants: Secondary | ICD-10-CM | POA: Insufficient documentation

## 2016-01-02 HISTORY — PX: CARDIAC CATHETERIZATION: SHX172

## 2016-01-02 LAB — CBC
HCT: 37.5 % (ref 36.0–46.0)
Hemoglobin: 11.4 g/dL — ABNORMAL LOW (ref 12.0–15.0)
MCH: 28.6 pg (ref 26.0–34.0)
MCHC: 30.4 g/dL (ref 30.0–36.0)
MCV: 94.2 fL (ref 78.0–100.0)
Platelets: 229 10*3/uL (ref 150–400)
RBC: 3.98 MIL/uL (ref 3.87–5.11)
RDW: 16.1 % — AB (ref 11.5–15.5)
WBC: 7.1 10*3/uL (ref 4.0–10.5)

## 2016-01-02 LAB — POCT I-STAT 3, VENOUS BLOOD GAS (G3P V)
ACID-BASE EXCESS: 2 mmol/L (ref 0.0–2.0)
Acid-Base Excess: 2 mmol/L (ref 0.0–2.0)
BICARBONATE: 26.8 mmol/L (ref 20.0–28.0)
BICARBONATE: 27.1 mmol/L (ref 20.0–28.0)
O2 SAT: 46 %
O2 SAT: 48 %
PCO2 VEN: 42.7 mmHg — AB (ref 44.0–60.0)
PH VEN: 7.398 (ref 7.250–7.430)
PO2 VEN: 25 mmHg — AB (ref 32.0–45.0)
PO2 VEN: 26 mmHg — AB (ref 32.0–45.0)
TCO2: 28 mmol/L (ref 0–100)
TCO2: 28 mmol/L (ref 0–100)
pCO2, Ven: 43.5 mmHg — ABNORMAL LOW (ref 44.0–60.0)
pH, Ven: 7.41 (ref 7.250–7.430)

## 2016-01-02 LAB — BASIC METABOLIC PANEL
Anion gap: 9 (ref 5–15)
BUN: 22 mg/dL — AB (ref 6–20)
CALCIUM: 9.7 mg/dL (ref 8.9–10.3)
CHLORIDE: 105 mmol/L (ref 101–111)
CO2: 26 mmol/L (ref 22–32)
CREATININE: 0.91 mg/dL (ref 0.44–1.00)
GFR calc non Af Amer: 58 mL/min — ABNORMAL LOW (ref >60)
Glucose, Bld: 95 mg/dL (ref 65–99)
Potassium: 4.1 mmol/L (ref 3.5–5.1)
SODIUM: 140 mmol/L (ref 135–145)

## 2016-01-02 LAB — POCT I-STAT 3, ART BLOOD GAS (G3+)
Acid-Base Excess: 1 mmol/L (ref 0.0–2.0)
BICARBONATE: 25.3 mmol/L (ref 20.0–28.0)
O2 Saturation: 94 %
PCO2 ART: 37.6 mmHg (ref 32.0–48.0)
PH ART: 7.436 (ref 7.350–7.450)
PO2 ART: 68 mmHg — AB (ref 83.0–108.0)
TCO2: 26 mmol/L (ref 0–100)

## 2016-01-02 LAB — PROTIME-INR
INR: 1.03
PROTHROMBIN TIME: 13.5 s (ref 11.4–15.2)

## 2016-01-02 SURGERY — RIGHT/LEFT HEART CATH AND CORONARY ANGIOGRAPHY

## 2016-01-02 MED ORDER — MIDAZOLAM HCL 2 MG/2ML IJ SOLN
INTRAMUSCULAR | Status: AC
  Filled 2016-01-02: qty 2

## 2016-01-02 MED ORDER — SODIUM CHLORIDE 0.9% FLUSH
3.0000 mL | Freq: Two times a day (BID) | INTRAVENOUS | Status: DC
Start: 2016-01-02 — End: 2016-01-02

## 2016-01-02 MED ORDER — ONDANSETRON HCL 4 MG/2ML IJ SOLN: 4.0000 mg | Freq: Four times a day (QID) | INTRAMUSCULAR | Status: DC | PRN

## 2016-01-02 MED ORDER — ASPIRIN 81 MG PO CHEW
CHEWABLE_TABLET | ORAL | Status: AC
  Administered 2016-01-02: 81 mg via ORAL
  Filled 2016-01-02: qty 1

## 2016-01-02 MED ORDER — SODIUM CHLORIDE 0.9 % IV SOLN: 250.0000 mL | INTRAVENOUS | Status: DC | PRN

## 2016-01-02 MED ORDER — LIDOCAINE HCL (PF) 1 % IJ SOLN
INTRAMUSCULAR | Status: AC
  Filled 2016-01-02: qty 30

## 2016-01-02 MED ORDER — VERAPAMIL HCL 2.5 MG/ML IV SOLN
INTRAVENOUS | Status: AC
  Filled 2016-01-02: qty 2

## 2016-01-02 MED ORDER — MIDAZOLAM HCL 2 MG/2ML IJ SOLN
INTRAMUSCULAR | Status: DC | PRN
  Administered 2016-01-02: 1 mg via INTRAVENOUS

## 2016-01-02 MED ORDER — VERAPAMIL HCL 2.5 MG/ML IV SOLN
INTRAVENOUS | Status: DC | PRN
  Administered 2016-01-02: 11:00:00 via INTRA_ARTERIAL

## 2016-01-02 MED ORDER — HEPARIN SODIUM (PORCINE) 1000 UNIT/ML IJ SOLN
INTRAMUSCULAR | Status: DC | PRN
  Administered 2016-01-02: 3000 [IU] via INTRAVENOUS

## 2016-01-02 MED ORDER — SODIUM CHLORIDE 0.9% FLUSH: 3.0000 mL | Freq: Two times a day (BID) | INTRAVENOUS | Status: DC

## 2016-01-02 MED ORDER — HEPARIN SODIUM (PORCINE) 1000 UNIT/ML IJ SOLN
INTRAMUSCULAR | Status: AC
  Filled 2016-01-02: qty 1

## 2016-01-02 MED ORDER — SODIUM CHLORIDE 0.9% FLUSH: 3.0000 mL | INTRAVENOUS | Status: DC | PRN

## 2016-01-02 MED ORDER — FENTANYL CITRATE (PF) 100 MCG/2ML IJ SOLN: INTRAMUSCULAR | Status: DC | PRN

## 2016-01-02 MED ORDER — SODIUM CHLORIDE 0.9 % IV SOLN: INTRAVENOUS | Status: AC

## 2016-01-02 MED ORDER — LIDOCAINE HCL (PF) 1 % IJ SOLN
INTRAMUSCULAR | Status: DC | PRN
  Administered 2016-01-02 (×2): 2 mL

## 2016-01-02 MED ORDER — HEPARIN (PORCINE) IN NACL 2-0.9 UNIT/ML-% IJ SOLN
INTRAMUSCULAR | Status: DC | PRN
  Administered 2016-01-02: 1500 mL

## 2016-01-02 MED ORDER — SODIUM CHLORIDE 0.9 % WEIGHT BASED INFUSION: 1.0000 mL/kg/h | INTRAVENOUS | Status: DC

## 2016-01-02 MED ORDER — IOPAMIDOL (ISOVUE-370) INJECTION 76%
INTRAVENOUS | Status: AC
  Filled 2016-01-02: qty 100

## 2016-01-02 MED ORDER — SODIUM CHLORIDE 0.9 % WEIGHT BASED INFUSION
3.0000 mL/kg/h | INTRAVENOUS | Status: AC
  Administered 2016-01-02: 3 mL/kg/h via INTRAVENOUS

## 2016-01-02 MED ORDER — IOPAMIDOL (ISOVUE-370) INJECTION 76%
INTRAVENOUS | Status: DC | PRN
  Administered 2016-01-02: 65 mL via INTRA_ARTERIAL

## 2016-01-02 MED ORDER — ASPIRIN 81 MG PO CHEW
81.0000 mg | CHEWABLE_TABLET | ORAL | Status: AC
  Administered 2016-01-02: 81 mg via ORAL

## 2016-01-02 MED ORDER — ACETAMINOPHEN 325 MG PO TABS: 650.0000 mg | ORAL_TABLET | ORAL | Status: DC | PRN

## 2016-01-02 MED ORDER — HEPARIN (PORCINE) IN NACL 2-0.9 UNIT/ML-% IJ SOLN
INTRAMUSCULAR | Status: AC
  Filled 2016-01-02: qty 1500

## 2016-01-02 SURGICAL SUPPLY — 15 items
CATH BALLN WEDGE 5F 110CM (CATHETERS) ×2 IMPLANT
CATH IMPULSE 5F ANG/FL3.5 (CATHETERS) ×2 IMPLANT
DEVICE RAD COMP TR BAND LRG (VASCULAR PRODUCTS) ×2 IMPLANT
GLIDESHEATH SLEND SS 6F .021 (SHEATH) ×2 IMPLANT
GUIDEWIRE .025 260CM (WIRE) ×2 IMPLANT
KIT HEART LEFT (KITS) ×3 IMPLANT
PACK CARDIAC CATHETERIZATION (CUSTOM PROCEDURE TRAY) ×3 IMPLANT
SHEATH FAST CATH BRACH 5F 5CM (SHEATH) ×2 IMPLANT
SYR MEDRAD MARK V 150ML (SYRINGE) ×3 IMPLANT
TRANSDUCER W/STOPCOCK (MISCELLANEOUS) ×6 IMPLANT
TUBING ART PRESS 72  MALE/FEM (TUBING) ×2
TUBING ART PRESS 72 MALE/FEM (TUBING) IMPLANT
TUBING CIL FLEX 10 FLL-RA (TUBING) ×3 IMPLANT
WIRE HI TORQ VERSACORE-J 145CM (WIRE) ×2 IMPLANT
WIRE SAFE-T 1.5MM-J .035X260CM (WIRE) ×2 IMPLANT

## 2016-01-02 NOTE — Progress Notes (Signed)
When IV d/c'ed skin tear noted and cleaned with sterile saline and steri-strips place on skin tear with non-stick dressing place over site

## 2016-01-02 NOTE — H&P (View-Only) (Signed)
[address]       [place] [address]             [phone]     CARDIOTHORACIC SURGERY CONSULTATION REPORT  Referring Provider is Minus Breeding, MD PCP is Redge Gainer, MD  Chief Complaint  Patient presents with  . Mitral Regurgitation    Surgical eval, ECHO 12/12/2015    HPI:  Patient is an 82 year old female with history of mitral valve prolapse and mitral regurgitation, hypertrophic cardiomyopathy, hypertension, chronic persistent atrial fibrillation on long-term warfarin anticoagulation, meningioma of the cerebellopontine angle, vertebral basilar insufficiency, silent embolic stroke noted on previous MRI, unilateral vocal cord paralysis, chronic memory loss, and degenerative arthritis with been referred for surgical consultation to discuss treatment options for management of severe symptomatic primary mitral regurgitation.  The patient has been followed for several years by Dr. Percival Spanish line for management of her mitral valve prolapse with mitral regurgitation and chronic persistent atrial fibrillation. Recent follow-up transthoracic echocardiogram suggested significant progression of disease with partially flail anterior leaflet and at least moderate to severe mitral regurgitation with moderate to severe tricuspid regurgitation and findings suggestive of pulmonary hypertension. She subsequently underwent transesophageal echocardiogram on 12/12/2015. This revealed normal left ventricular systolic function with mitral valve prolapse including flail segment of the anterior leaflet with evidence of ruptured chordae tendoneae. There was moderate thickening of the mitral valve leaflets with severe mitral regurgitation that was treated directed eccentrically and posteriorly around the left atrium. There was severe left atrial enlargement. There was mildly reduced right ventricular function and severe right atrial enlargement. There was moderate tricuspid regurgitation.  There were findings consistent with severe pulmonary hypertension.  The patient was referred for surgical consultation.  The patient is retired and lives with her husband in [place]. The husband is not physically well and was not able to accompany the patient for her visit in our office today. The patient is accompanied by her 2 sons who are very supportive.  The patient lives a sedentary lifestyle and has not been active physically for many years. She complains of exertional shortness of breath that has progressed over the past year. She now gets short of breath with minimal activity. She denies any history of resting shortness of breath. She occasionally has episodes of shortness of breath that awaken her from her sleep and are relieved by sitting up in bed. She has had dizzy spells without syncopal episodes. She has had no history of chest pain or chest tightness either with activity or at rest. The patient has had problems with a chronic cough that has been productive recently and exacerbated since she developed chronic hoarseness of voice. The patient's hoarseness of voice began nearly 6 months ago. She has been diagnosed with unilateral vocal cord paralysis. She was recently scheduled for vocal cord medialization at [facility] but this was canceled because of concerns regarding the patient's cardiac history.  Patient has also suffered several falls recently and has problems with poor balance. She has problems with short-term memory loss and chronic double vision. She was recently evaluated by Dr. Leonie Man who noted that follow-up MRI performed last May revealed findings consistent with small embolic stroke in the left cerebellum. The patient also has known chronic meningioma of the cerebellopontine angle that appeared stable on the patient's recent MRI. The patient has been referred to Dr. Ellene Route for surgical consultation regarding her meningioma.  The patient also has been scheduled  for  EEG and CT angiogram of the head and neck which remained pending at this time.  Past Medical History:  Diagnosis Date  . Alopecia   . Anxiety   . Arthritis   . Atrial fibrillation, persistent ([place]) 10/30/2015  . Cataract   . DDD (degenerative disc disease), cervical   . Diverticulosis of colon   . Esotropia   . History of double vision   . Hyperlipidemia   . Hypertension   . Hypertrophic cardiomyopathy ([facility])    by history but not identified on the most recent echo  . Meningioma ([facility])    cerebellopontine angle   . Mild mitral valve prolapse   . Mitral insufficiency    Severe with anterior flail leaflet.    . Osteoporosis   . Postmenopausal HRT (hormone replacement therapy)   . 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 Vertebrobasilar insufficiency   . Vocal cord paralysis, unilateral complete     Past Surgical History:  Procedure Laterality Date  . ABDOMINAL HYSTERECTOMY    . CATARACT EXTRACTION, BILATERAL    . EYE SURGERY    . TEE WITHOUT CARDIOVERSION N/A 12/12/2015   Procedure: TRANSESOPHAGEAL ECHOCARDIOGRAM (TEE);  Surgeon: Lelon Perla, MD;  Location: [facility] ENDOSCOPY;  Service: Cardiovascular;  Laterality: N/A;  . TONSILLECTOMY    . VEIN SURGERY      Family History  Problem Relation Age of Onset  . Other Brother     IHSS  . COPD Brother   . Diabetes Brother   . Heart disease Brother   . Hyperlipidemia Brother   . Hypertension Brother   . Asthma Mother   . Other Sister     IHSS  . Other Sister     IHSS  . Cancer Cousin     thyroid    Social History   Social History  . Marital status: Married    Spouse name: N/A  . Number of children: N/A  . Years of education: N/A   Occupational History  . Not on file.   Social History Main Topics  . Smoking status: Never Smoker  . Smokeless tobacco: Never Used     Comment: Rare previously  . Alcohol use No  . Drug use: No  . Sexual activity: No   Other Topics  Concern  . Not on file   Social History Narrative  . No narrative on file    Current Outpatient Prescriptions  Medication Sig Dispense Refill  . amLODipine (NORVASC) 2.5 MG tablet Take 1 tablet (2.5 mg total) by mouth daily. 90 tablet 3  . Cholecalciferol (VITAMIN D3) 2000 UNITS TABS Take 2,000 Units by mouth daily.    . clobetasol cream (TEMOVATE) 0.05 % Apply topically 2 (two) times daily. 30 g 0  . DULoxetine (CYMBALTA) 30 MG capsule Take 1 capsule (30 mg total) by mouth daily. 30 capsule 2  . furosemide (LASIX) 40 MG tablet Take 1 tablet (40 mg total) by mouth daily. 90 tablet 3  . lisinopril (PRINIVIL,ZESTRIL) 40 MG tablet Take 1 tablet (40 mg total) by mouth daily. 90 tablet 3  . metoprolol succinate (TOPROL-XL) 25 MG 24 hr tablet Take 0.5 tablets (12.5 mg total) by mouth daily. 45 tablet 3  . polyethylene glycol (MIRALAX / GLYCOLAX) packet Take 17 g by mouth daily as needed (for constipation.).    Marland Kitchen warfarin (COUMADIN) 5 MG tablet Take 1 tablet (5 mg total) by mouth daily. (Patient taking  differently: Take 5 mg by mouth daily. Managed by Dr. Laurance Flatten) 30 tablet 6   No current facility-administered medications for this visit.     Allergies  Allergen Reactions  . Actonel [Risedronate Sodium] Other (See Comments)    Esophagitis   . Diphenhydramine Hcl Hives  . Fosamax [Alendronate Sodium] Other (See Comments)    Esophagitis   . Livalo [Pitavastatin Calcium] Other (See Comments)    Pt doesn't remember reaction  . Pravastatin Other (See Comments)    Extreme leg cramping.  . Sulfa Antibiotics Other (See Comments)    Pt doesn't remember reaction  . Zocor [Simvastatin] Other (See Comments)    Pt doesn't remember reaction      Review of Systems:   General:  poor appetite, decreased energy, no weight gain, no weight loss, no fever  Cardiac:  no chest pain with exertion, no chest pain at rest, +SOB with exertion, NO resting SOB, + PND, NO orthopnea, no palpitations, +  arrhythmia, + atrial fibrillation, no LE edema, + dizzy spells, no syncope  Respiratory:  + shortness of breath, no home oxygen, + productive cough, no dry cough, no bronchitis, no wheezing, no hemoptysis, no asthma, no pain with inspiration or cough, no sleep apnea, no CPAP at night  GI:   + difficulty swallowing, no reflux, no frequent heartburn, no hiatal hernia, no abdominal pain, no constipation, no diarrhea, no hematochezia, no hematemesis, no melena  GU:   no dysuria,  no frequency, + urinary tract infection, no hematuria, no kidney stones, no kidney disease  Vascular:  no pain suggestive of claudication, no pain in feet, + leg cramps, + varicose veins, no DVT, no non-healing foot ulcer  Neuro:   + stroke, ? TIA's, ? seizures, no headaches, no temporary blindness one eye,  no slurred speech, = peripheral neuropathy, NO chronic pain, NO instability of gait, + memory/cognitive dysfunction  Musculoskeletal: + arthritis, no joint swelling, no myalgias, some difficulty walking, limited mobility   Skin:   no rash, no itching, no skin infections, + pressure sores or ulcerations  Psych:   + anxiety, no depression, no nervousness, no unusual recent stress  Eyes:   + blurry vision, no floaters, + recent vision changes, + wears glasses or contacts  ENT:   + hearing loss, no loose or painful teeth, no dentures, last saw dentist within the past year  Hematologic:  + easy bruising, no abnormal bleeding, no clotting disorder, no frequent epistaxis  Endocrine:  no diabetes, does not check CBG's at home     Physical Exam:   BP 111/66 (BP Location: Left Arm, Patient Position: Sitting, Cuff Size: Small)   Pulse 76   Resp 20   Ht 5\' 4"  (1.626 m)   Wt 134 lb (60.8 kg)   LMP 08/01/1982   SpO2 98% Comment: RA  BMI 23.00 kg/m   General:  Elderly and somewhat frail-appearing  HEENT:  Unremarkable   Neck:   no JVD, no bruits, no adenopathy   Chest:   clear to auscultation, symmetrical breath sounds, no  wheezes, no rhonchi   CV:   Irregular rate and rhythm, grade IV/VI holosystolic murmur best at apex and left axilla  Abdomen:  soft, non-tender, no masses   Extremities:  warm, well-perfused, pulses diminished, no LE edema  Rectal/GU  Deferred  Neuro:   Grossly non-focal and symmetrical throughout  Skin:   Clean and dry, no rashes, no breakdown   Diagnostic Tests:  Transthoracic Echocardiography  Patient:  Patricia Villa, Kelliher MR #:       [id number] Study Date: 11/21/2015 Gender:     F Age:        56 Height:     162.6 cm Weight:     60.8 kg BSA:        1.66 m^2 Pt. Status: Room:   ORDERING     Minus Breeding, MD  REFERRING    Minus Breeding, MD  ATTENDING    Lyman Bishop MD  SONOGRAPHER  Marygrace Drought, RCS  PERFORMING   [facility], Outpatient  cc:  ------------------------------------------------------------------- LV EF: 60% -   65%  ------------------------------------------------------------------- Indications:      Mitral valve Disease (105.9).  ------------------------------------------------------------------- History:   PMH:  hyperlipidemia.  Atrial fibrillation.  Mitral valve prolapse.  Risk factors:  Hypertension.  ------------------------------------------------------------------- Study Conclusions  - Left ventricle: The cavity size was normal. Wall thickness was   increased in a pattern of mild LVH. There was moderate focal   basal hypertrophy of the septum. Systolic function was normal.   The estimated ejection fraction was in the range of 60% to 65%.   Wall motion was normal; there were no regional wall motion   abnormalities. The study is not technically sufficient to allow   evaluation of LV diastolic function. - Aortic valve: Trileaflet. Sclerosis without stenosis. There was   mild regurgitation. - Mitral valve: Calcified annulus. Mildly thickened leaflets . SAM   is noted. Prolapse of the anterior leaflet and mild tethering of   the posterior  leaflet. There appears to be a partially flail   subvalvular cord attached to the anterior leaflet tip. There was   moderate to severe, posteriorly directed and eccentric   regurgitation the hugs the RA free wall. - Left atrium: Severely dilated. - Right ventricle: The cavity size was mildly dilated. Systolic   function is reduced. - Right atrium: The atrium was mildly dilated. - Atrial septum: No defect or patent foramen ovale was identified. - Tricuspid valve: There was moderate regurgitation. - Pulmonic valve: Poorly visualized. There was moderate   regurgitation. - Pulmonary arteries: The main pulmonary artery is dilated. PA peak   pressure: 84 mm Hg (S). - Inferior vena cava: The vessel was normal in size. The   respirophasic diameter changes were in the normal range (= 50%),   consistent with normal central venous pressure.  Impressions:  - Compared to the prior study in 11/2014, there is now moderate to   severe eccentric MR. Anterior leaflet prolapse with a partially   flail cord is noted. RVSP is significantly elevated at 84 mmHg   with at least moderate TR.  ------------------------------------------------------------------- Study data:  Comparison was made to the study of 11/17/2014.  Study status:  Routine.  Procedure:  The patient reported no pain pre or post test. Transthoracic echocardiography. Image quality was adequate.          Transthoracic echocardiography.  M-mode, complete 2D, spectral Doppler, and color Doppler.  Birthdate: Patient birthdate: [birth year].  Age:  Patient is 82 yr old.  Sex: Gender: female.    BMI: 23 kg/m^2.  Blood pressure:     113/74 Patient status:  Outpatient.  Study date:  Study date: 11/21/2015. Study time: 11:40 AM.  Location:  [facility]  -------------------------------------------------------------------  ------------------------------------------------------------------- Left ventricle:  The cavity size was normal. Wall  thickness was increased in a pattern of mild LVH. There was moderate focal basal hypertrophy of the septum. Systolic function was normal. The  estimated ejection fraction was in the range of 60% to 65%. Wall motion was normal; there were no regional wall motion abnormalities. The study is not technically sufficient to allow evaluation of LV diastolic function.  ------------------------------------------------------------------- Aortic valve:   Trileaflet. Sclerosis without stenosis.  Doppler: There was mild regurgitation.  ------------------------------------------------------------------- Aorta:  Aortic root: The aortic root was normal in size. Ascending aorta: The ascending aorta was normal in size.  ------------------------------------------------------------------- Mitral valve:   Calcified annulus. Mildly thickened leaflets . SAM is noted. Prolapse of the anterior leaflet and mild tethering of the posterior leaflet. There appears to be a partially flail subvalvular cord attached to the anterior leaflet tip.  Doppler: There was moderate to severe, posteriorly directed and eccentric regurgitation the hugs the RA free wall.    Peak gradient (D): 5 mm Hg.  ------------------------------------------------------------------- Left atrium:  Severely dilated.  ------------------------------------------------------------------- Atrial septum:  No defect or patent foramen ovale was identified.   ------------------------------------------------------------------- Right ventricle:  The cavity size was mildly dilated. Systolic function is reduced.  ------------------------------------------------------------------- Pulmonic valve:   Poorly visualized.  Doppler:  There was moderate regurgitation.  ------------------------------------------------------------------- Tricuspid valve:   Doppler:  There was moderate  regurgitation.  ------------------------------------------------------------------- Pulmonary artery:   The main pulmonary artery is dilated.  ------------------------------------------------------------------- Right atrium:  The atrium was mildly dilated.  ------------------------------------------------------------------- Pericardium:  There was no pericardial effusion.  ------------------------------------------------------------------- Systemic veins: Inferior vena cava: The vessel was normal in size. The respirophasic diameter changes were in the normal range (= 50%), consistent with normal central venous pressure. Diameter: 18 mm.  ------------------------------------------------------------------- Measurements   IVC                                         Value        Reference  ID                                          18    mm     ---------    Left ventricle                              Value        Reference  LV ID, ED, PLAX chordal                     43.1  mm     43 - 52  LV ID, ES, PLAX chordal                     26.9  mm     23 - 38  LV fx shortening, PLAX chordal              38    %      >=29  LV PW thickness, ED                         14.5  mm     ---------  IVS/LV PW ratio, ED                         0.82         <=1.3  Stroke volume, 2D                           38    ml     ---------  Stroke volume/bsa, 2D                       23    ml/m^2 ---------  LV ejection fraction, 1-p A4C               54    %      ---------  LV e&', lateral                              13.6  cm/s   ---------  LV E/e&', lateral                            7.87         ---------  LV e&', medial                               3.37  cm/s   ---------  LV E/e&', medial                             31.75        ---------  LV e&', average                              8.49  cm/s   ---------  LV E/e&', average                            12.61        ---------    Ventricular septum                           Value        Reference  IVS thickness, ED                           11.9  mm     ---------    LVOT                                        Value        Reference  LVOT ID, S                                  18    mm     ---------  LVOT area                                   2.54  cm^2   ---------  LVOT peak velocity, S                       77.6  cm/s   ---------  LVOT mean velocity, S  59.1  cm/s   ---------  LVOT VTI, S                                 15.1  cm     ---------    Aortic valve                                Value        Reference  Aortic regurg pressure half-time            484   ms     ---------    Aorta                                       Value        Reference  Aortic root ID, ED                          30    mm     ---------    Left atrium                                 Value        Reference  LA ID, A-P, ES                              44    mm     ---------  LA ID/bsa, A-P                      (H)     2.65  cm/m^2 <=2.2  LA volume, S                                81.1  ml     ---------  LA volume/bsa, S                            48.8  ml/m^2 ---------  LA volume, ES, 1-p A4C                      70.1  ml     ---------  LA volume/bsa, ES, 1-p A4C                  42.2  ml/m^2 ---------  LA volume, ES, 1-p A2C                      93.6  ml     ---------  LA volume/bsa, ES, 1-p A2C                  56.3  ml/m^2 ---------    Mitral valve                                Value        Reference  Mitral E-wave peak velocity  107   cm/s   ---------  Mitral deceleration time                    180   ms     150 - 230  Mitral peak gradient, D                     5     mm Hg  ---------    Pulmonary arteries                          Value        Reference  PA pressure, S, DP                  (H)     84    mm Hg  <=30    Tricuspid valve                             Value        Reference  Tricuspid regurg peak  velocity              436   cm/s   ---------  Tricuspid peak RV-RA gradient               76    mm Hg  ---------  Tricuspid maximal regurg velocity,          436   cm/s   ---------  PISA    Systemic veins                              Value        Reference  Estimated CVP                               8     mm Hg  ---------    Right ventricle                             Value        Reference  TAPSE                                       14.3  mm     ---------  RV pressure, S, DP                  (H)     84    mm Hg  <=30  RV s&', lateral, S                           9.46  cm/s   ---------  Legend: (L)  and  (H)  mark values outside specified reference range.  ------------------------------------------------------------------- Prepared and Electronically Authenticated by  Lyman Bishop MD [id number]:49:38    Transesophageal Echocardiography  Patient:    Kamarea, Patricia Villa MR #:       [id number] Study Date: 12/12/2015 Gender:     F Age:        82 Height:     162.6 cm Weight:     62.7 kg BSA:  1.69 m^2 Pt. Status: Room:   [facility]  ATTENDING    Kathlen Mody, Watchung, Scott T  REFERRING    Richardson Dopp T  SONOGRAPHER  Johny Chess, RDCS, CCT  cc:  ------------------------------------------------------------------- LV EF: 55% -   60%  ------------------------------------------------------------------- Indications:      Mitral regurgitation 424.0.  ------------------------------------------------------------------- Study Conclusions  - Left ventricle: Systolic function was normal. The estimated   ejection fraction was in the range of 55% to 60%. Wall motion was   normal; there were no regional wall motion abnormalities. - Aortic valve: No evidence of vegetation. There was mild   regurgitation. - Mitral valve: Moderate thickening. Severe flail motion involving   the anterior  leaflet due to rupture of one or more chords. No   evidence of vegetation. There was severe regurgitation directed   eccentrically, posteriorly, and toward the free wall. - Left atrium: The atrium was severely dilated. No evidence of   thrombus in the atrial cavity or appendage. - Right ventricle: Systolic function was mildly reduced. - Right atrium: The atrium was severely dilated. - Atrial septum: No defect or patent foramen ovale was identified. - Tricuspid valve: No evidence of vegetation. There was moderate   regurgitation. - Pulmonic valve: No evidence of vegetation. - Pulmonary arteries: Systolic pressure was severely increased.  Impressions:  - Normal LV systolic function; severe biatrial enlargment; mildly   reduced RV function; sclerotic aortic valve with mild AI;   moderately thickened MV with ruptured chord/flail anterior   leaflet and mild prolapse of posterior leaflet; severe, posterior   and lateral directed MR; mild to moderate PI; moderate TR with   severely elevated pulmonary pressure (TR velocity 3.7 m/s).  ------------------------------------------------------------------- Study data:   Study status:  Routine.  Consent:  The risks, benefits, and alternatives to the procedure were explained to the patient and informed consent was obtained.  Procedure:  Initial setup. The patient was brought to the laboratory. Surface ECG leads were monitored. Sedation. Conscious sedation was administered by cardiology staff. Transesophageal echocardiography. An adult multiplane transesophageal probe was inserted by the attending cardiologistwithout difficulty. Image quality was adequate.  Study completion:  The patient tolerated the procedure well. There were no complications.  Administered medications:   Midazolam, 2mg , IV. Fentanyl, 72mcg, IV.          Diagnostic transesophageal echocardiography.  2D and color Doppler.  Birthdate:  Patient birthdate: 1932/09/24.  Age:   Patient is 82 yr old.  Sex:  Gender: female.    BMI: 23.7 kg/m^2.  Blood pressure:     146/78  Patient status:  Outpatient.  Study date:  Study date: 12/12/2015. Study time: 10:56 AM.  Location:  Endoscopy.  -------------------------------------------------------------------  ------------------------------------------------------------------- Left ventricle:  Systolic function was normal. The estimated ejection fraction was in the range of 55% to 60%. Wall motion was normal; there were no regional wall motion abnormalities.  ------------------------------------------------------------------- Aortic valve:   Mildly thickened leaflets. Cusp separation was normal.  No evidence of vegetation.  Doppler:  There was mild regurgitation.  ------------------------------------------------------------------- Aorta:  Descending aorta: The descending aorta had mild diffuse disease.  ------------------------------------------------------------------- Mitral valve:   Moderate thickening. Leaflet separation was normal.  Severe flail motion involving the anterior leaflet due to rupture of one or more chords.  No evidence of vegetation.  Doppler:  There was severe regurgitation directed eccentrically, posteriorly, and toward the free wall.  -------------------------------------------------------------------  Left atrium:  The atrium was severely dilated.  No evidence of thrombus in the atrial cavity or appendage.  ------------------------------------------------------------------- Atrial septum:  No defect or patent foramen ovale was identified.   ------------------------------------------------------------------- Right ventricle:  The cavity size was normal. Systolic function was mildly reduced.  ------------------------------------------------------------------- Pulmonic valve:    Structurally normal valve.   Cusp separation was normal.  No evidence of vegetation.  Doppler:  There was  mild to moderate regurgitation.  ------------------------------------------------------------------- Tricuspid valve:   Structurally normal valve.   Leaflet separation was normal.  No evidence of vegetation.  Doppler:  There was moderate regurgitation.  ------------------------------------------------------------------- Pulmonary artery:   Systolic pressure was severely increased.  ------------------------------------------------------------------- Right atrium:  The atrium was severely dilated.  ------------------------------------------------------------------- Pericardium:  There was no pericardial effusion.   ------------------------------------------------------------------- Prepared and Electronically Authenticated by  Kirk Ruths 2017-08-08T12:06:57    MRI HEAD WITHOUT AND WITH CONTRAST  TECHNIQUE: Multiplanar, multiecho pulse sequences of the brain and surrounding structures were obtained without and with intravenous contrast.  CONTRAST:  87mL MULTIHANCE GADOBENATE DIMEGLUMINE 529 MG/ML IV SOLN  COMPARISON:  MRI 09/08/2014  FINDINGS: Enhancing mass along the left features for age is unchanged. This is posterior to the internal auditory canal and shows broad dural base and most consistent with meningioma. This measures 15 x 14 mm in size. No edema in the adjacent cerebellum.  Generalized atrophy.  Negative for hydrocephalus  Negative for acute infarct. Moderate chronic microvascular ischemic changes in the white matter. Mild chronic ischemia in the pons.  Negative for hemorrhage or fluid collection.  Linear cortical enhancement in the left occipital cortex likely an area of subacute infarct. This does not show restricted diffusion at this time but may be several weeks old. Correlate with any history of of stroke.  IMPRESSION: Left P2 is reach meningioma unchanged  Cortical enhancement left occipital lobe most consistent with subacute  infarct measuring approximately 1 cm in size.   Electronically Signed   By: Franchot Gallo M.D.   On: 09/28/2015 13:28    CT HEAD WITHOUT CONTRAST TECHNIQUE: Contiguous axial images were obtained from the base of the skull through the vertex without intravenous contrast. COMPARISON:  Brain MRI, 09/28/2015.  Head CT, 10/12/2007. FINDINGS: The ventricles are normal in configuration. There is age related, mild, ventricular and sulcal enlargement. No hydrocephalus. There are no parenchymal masses or mass effect. There is no evidence of a recent cortical infarct. Patchy white matter hypoattenuation is noted consistent with moderate chronic microvascular ischemic change, without change from the more recent prior brain MRI. The left cerebral pontine angle meningioma is without significant change from the prior exams, heavily calcified. No other extra-axial abnormalities. There is no intracranial hemorrhage. The visualized sinuses and mastoid air cells are clear. IMPRESSION: 1. No acute intracranial abnormalities. 2. Moderate chronic bypass ischemic change. 3. Stable left cerebellopontine angle meningioma. Electronically Signed   By: Lajean Manes M.D.   On: 11/30/2015 14:18  Impression:  Patient has stage D severe symptomatic primary mitral regurgitation, chronic persistent atrial fibrillation, and long-standing hypertension with reported history of hypertrophic cardiomyopathy. She is quite frail and not active physically but she describes progressive symptoms of exertional shortness of breath, fatigue, and PND consistent with chronic diastolic congestive heart failure, New York Heart Association functional class III. I have personally reviewed the patient's recent transthoracic and transesophageal echocardiograms. She has myxomatous degenerative disease with at least moderate thickening of both leaflets of the mitral valve and an obvious flail segment of  the anterior leaflet with  ruptured primary chordae tendineae.  There is severe mitral regurgitation with dilated left atrium, dilated right ventricle, moderate tricuspid regurgitation, and findings suggestive of severe pulmonary hypertension.  Left ventricular systolic function appears normal although she likely has significant diastolic dysfunction.  Diagnostic cardiac catheterization has not yet been performed.  I agree the patient would best be treated with mitral valve repair or replacement, Maze procedure, and possible tricuspid valve repair. However, I am concerned that risks associated with conventional surgery might be relatively high even if approached using minimally invasive techniques because of the patient's numerous comorbid medical problems and significant physical deconditioning. I am particularly concerned regarding the patient's ongoing neurologic problems with chronic memory loss, double vision, recently developed unilateral vocal cord paralysis, embolic stroke, and meningioma.  It might be reasonable to consider referral for palliative edge to edge MitraClip repair of the mitral valve if subsequent workup suggests that the risks associated with conventional surgery might be prohibitive.    Plan:  I have discussed matters at length with the patient and her 2 sons in the office today. The indications, risks, and potential benefits of mitral valve repair or replacement have been discussed at length. Alternative treatment strategies such as long-term palliative medical therapy and palliative edge to edge mitral clip repair have been discussed.  Long-term prognosis with medical therapy alone has been discussed.  The patient and her family remain interested in pursuing an aggressive approach to her care at this time.  As a next step she will need to undergo left and right heart catheterization. In addition we will refer the patient for a formal physical therapy evaluation including frailty assessment and 6 minute walk  test. We will await results from the EEG and CT angiogram of the head and neck recently ordered by Dr. Leonie Man and the consultation scheduled with Dr. Ellene Route.  The patient will return to our office in approximately 4 weeks to review the results of her various diagnostic tests and consultations. All of their questions have been addressed.   I spent in excess of 90 minutes during the conduct of this office consultation and >50% of this time involved direct face-to-face encounter with the patient for counseling and/or coordination of their care.    Valentina Gu. Roxy Manns, MD 12/22/2015 3:57 PM

## 2016-01-02 NOTE — Discharge Instructions (Signed)
Radial Site Care Refer to this sheet in the next few weeks. These instructions provide you with information about caring for yourself after your procedure. Your health care provider may also give you more specific instructions. Your treatment has been planned according to current medical practices, but problems sometimes occur. Call your health care provider if you have any problems or questions after your procedure. WHAT TO EXPECT AFTER THE PROCEDURE After your procedure, it is typical to have the following:  Bruising at the radial site that usually fades within 1-2 weeks.  Blood collecting in the tissue (hematoma) that may be painful to the touch. It should usually decrease in size and tenderness within 1-2 weeks. HOME CARE INSTRUCTIONS  Take medicines only as directed by your health care provider.  You may shower 24-48 hours after the procedure or as directed by your health care provider. Remove the bandage (dressing) and gently wash the site with plain soap and water. Pat the area dry with a clean towel. Do not rub the site, because this may cause bleeding.  Do not take baths, swim, or use a hot tub until your health care provider approves.  Check your insertion site every day for redness, swelling, or drainage.  Do not apply powder or lotion to the site.  Do not flex or bend the affected arm for 24 hours or as directed by your health care provider.  Do not push or pull heavy objects with the affected arm for 24 hours or as directed by your health care provider.  Do not lift over 10 lb (4.5 kg) for 5 days after your procedure or as directed by your health care provider.  Ask your health care provider when it is okay to:  Return to work or school.  Resume usual physical activities or sports.  Resume sexual activity.  Do not drive home if you are discharged the same day as the procedure. Have someone else drive you.  You may drive 24 hours after the procedure unless otherwise  instructed by your health care provider.  Do not operate machinery or power tools for 24 hours after the procedure.  If your procedure was done as an outpatient procedure, which means that you went home the same day as your procedure, a responsible adult should be with you for the first 24 hours after you arrive home.  Keep all follow-up visits as directed by your health care provider. This is important. SEEK MEDICAL CARE IF:  You have a fever.  You have chills.  You have increased bleeding from the radial site. Hold pressure on the site. SEEK IMMEDIATE MEDICAL CARE IF:  You have unusual pain at the radial site.  You have redness, warmth, or swelling at the radial site.  You have drainage (other than a small amount of blood on the dressing) from the radial site.  The radial site is bleeding, and the bleeding does not stop after 30 minutes of holding steady pressure on the site.  Your arm or hand becomes pale, cool, tingly, or numb.   This information is not intended to replace advice given to you by your health care provider. Make sure you discuss any questions you have with your health care provider.   Document Released: 05/25/2010 Document Revised: 05/13/2014 Document Reviewed: 11/08/2013 Elsevier Interactive Patient Education 2016 [employer]   . START BACK ON COUMADIN AND LOVENOX TONIGHT AT [place].  CALL YOUR COUMADIN CLINIC TOMORROW AND SCHEDULE APPOINTMENT FOR COUMADIN BLOOD TEST THIS FRIDAY

## 2016-01-02 NOTE — Interval H&P Note (Signed)
History and Physical Interval Note:  01/02/2016 10:26 AM  Patricia Villa  has presented today for surgery, with the diagnosis of mr  The various methods of treatment have been discussed with the patient and family. After consideration of risks, benefits and other options for treatment, the patient has consented to  Procedure(s): Right/Left Heart Cath and Coronary Angiography (N/A) as a surgical intervention .  The patient's history has been reviewed, patient examined, no change in status, stable for surgery.  I have reviewed the patient's chart and labs.  Questions were answered to the patient's satisfaction.     Sherren Mocha

## 2016-01-02 NOTE — Progress Notes (Signed)
Received from cath lab via stretcher and blood noted under IV dressing and per cath lab staff member skin tear occurred when IV site dressing changed; right brachial sheath removed and pressure held for 22min

## 2016-01-02 NOTE — Progress Notes (Signed)
Advised client to leave steri-strips in place and they will fall off

## 2016-01-03 ENCOUNTER — Encounter (HOSPITAL_COMMUNITY): Payer: Self-pay | Admitting: Cardiovascular Disease

## 2016-01-03 DIAGNOSIS — I341 Nonrheumatic mitral (valve) prolapse: Secondary | ICD-10-CM | POA: Diagnosis not present

## 2016-01-03 DIAGNOSIS — M6281 Muscle weakness (generalized): Secondary | ICD-10-CM | POA: Diagnosis not present

## 2016-01-03 DIAGNOSIS — I10 Essential (primary) hypertension: Secondary | ICD-10-CM | POA: Diagnosis not present

## 2016-01-03 DIAGNOSIS — Z7901 Long term (current) use of anticoagulants: Secondary | ICD-10-CM | POA: Diagnosis not present

## 2016-01-03 DIAGNOSIS — I872 Venous insufficiency (chronic) (peripheral): Secondary | ICD-10-CM | POA: Diagnosis not present

## 2016-01-05 ENCOUNTER — Ambulatory Visit (INDEPENDENT_AMBULATORY_CARE_PROVIDER_SITE_OTHER): Payer: Medicare Other | Admitting: Pharmacist Clinician (PhC)/ Clinical Pharmacy Specialist

## 2016-01-05 DIAGNOSIS — I480 Paroxysmal atrial fibrillation: Secondary | ICD-10-CM | POA: Diagnosis not present

## 2016-01-05 DIAGNOSIS — I4819 Other persistent atrial fibrillation: Secondary | ICD-10-CM

## 2016-01-05 LAB — COAGUCHEK XS/INR WAIVED
INR: 1.1 (ref 0.9–1.1)
Prothrombin Time: 12.7 s

## 2016-01-05 NOTE — Patient Instructions (Signed)
Anticoagulation Dose Instructions as of 01/05/2016      Patricia Villa Tue Wed Thu Fri Sat   New Dose 5 mg 5 mg 5 mg 5 mg 5 mg 5 mg 5 mg    Description   Take 1 tablet (5mg ) every day until your next protime check  INR today 1.1 "too Thick"

## 2016-01-09 ENCOUNTER — Encounter: Payer: Self-pay | Admitting: Pharmacist

## 2016-01-09 DIAGNOSIS — I872 Venous insufficiency (chronic) (peripheral): Secondary | ICD-10-CM | POA: Diagnosis not present

## 2016-01-09 DIAGNOSIS — Z7901 Long term (current) use of anticoagulants: Secondary | ICD-10-CM | POA: Diagnosis not present

## 2016-01-09 DIAGNOSIS — I10 Essential (primary) hypertension: Secondary | ICD-10-CM | POA: Diagnosis not present

## 2016-01-09 DIAGNOSIS — M6281 Muscle weakness (generalized): Secondary | ICD-10-CM | POA: Diagnosis not present

## 2016-01-09 DIAGNOSIS — I341 Nonrheumatic mitral (valve) prolapse: Secondary | ICD-10-CM | POA: Diagnosis not present

## 2016-01-10 ENCOUNTER — Encounter: Payer: Self-pay | Admitting: Family Medicine

## 2016-01-10 ENCOUNTER — Ambulatory Visit (INDEPENDENT_AMBULATORY_CARE_PROVIDER_SITE_OTHER): Payer: Medicare Other | Admitting: Pharmacist

## 2016-01-10 ENCOUNTER — Encounter: Payer: Self-pay | Admitting: Pharmacist

## 2016-01-10 DIAGNOSIS — I481 Persistent atrial fibrillation: Secondary | ICD-10-CM

## 2016-01-10 DIAGNOSIS — I480 Paroxysmal atrial fibrillation: Secondary | ICD-10-CM

## 2016-01-10 DIAGNOSIS — I4819 Other persistent atrial fibrillation: Secondary | ICD-10-CM

## 2016-01-10 LAB — COAGUCHEK XS/INR WAIVED
INR: 1.2 — AB (ref 0.9–1.1)
PROTHROMBIN TIME: 14.6 s

## 2016-01-10 NOTE — Progress Notes (Signed)
Patient ID: Patricia Villa, female   DOB: 10-05-1932, 82 y.o.   MRN: [id number] Subjective:     Indication: atrial fibrillation Bleeding signs/symptoms: None Thromboembolic signs/symptoms: None  Missed Coumadin doses: This week - has cardio procedure and warfarin was held Medication changes: no Dietary changes: no Bacterial/viral infection: no Other concerns: no   Objective:    INR Today: 1.2 Current dose: warfarin 5mg  daily for last 5 days    Assessment:    Subtherapeutic INR for goal of 2-3   Plan:    1. Administered enoxaparin 60mg  SQ today in office - patient supplied.  2.  Patient to administered enoxaparin 60mg  q12 hours for the next 24 hours 3.  Take warfarin 2 tablets today and 1 and 1/2 tablets tomorrow then resume warfarin 5mg  daily until next protime.  4.  Would like to recheck INR in 2 days however due to conflicting appointments patient states she cannot RTC until Tuesday, September 12th. appt made for then.

## 2016-01-11 ENCOUNTER — Ambulatory Visit (INDEPENDENT_AMBULATORY_CARE_PROVIDER_SITE_OTHER): Payer: Medicare Other | Admitting: Pharmacist

## 2016-01-11 DIAGNOSIS — Z7901 Long term (current) use of anticoagulants: Secondary | ICD-10-CM | POA: Diagnosis not present

## 2016-01-11 DIAGNOSIS — Z8673 Personal history of transient ischemic attack (TIA), and cerebral infarction without residual deficits: Secondary | ICD-10-CM | POA: Diagnosis not present

## 2016-01-11 DIAGNOSIS — I481 Persistent atrial fibrillation: Secondary | ICD-10-CM | POA: Diagnosis not present

## 2016-01-11 DIAGNOSIS — I4819 Other persistent atrial fibrillation: Secondary | ICD-10-CM

## 2016-01-11 DIAGNOSIS — M6281 Muscle weakness (generalized): Secondary | ICD-10-CM | POA: Diagnosis not present

## 2016-01-11 DIAGNOSIS — I872 Venous insufficiency (chronic) (peripheral): Secondary | ICD-10-CM | POA: Diagnosis not present

## 2016-01-11 DIAGNOSIS — I10 Essential (primary) hypertension: Secondary | ICD-10-CM | POA: Diagnosis not present

## 2016-01-11 DIAGNOSIS — I341 Nonrheumatic mitral (valve) prolapse: Secondary | ICD-10-CM | POA: Diagnosis not present

## 2016-01-11 NOTE — Progress Notes (Signed)
Patient ID: Patricia Villa, female   DOB: 01/28/1933, 82 y.o.   MRN: [id number]  Patient come to office today to have enoxaparin adminstered as she is unsure of giving to herself.  She bring her own medication .  Enoxaparin 60mg  administered SQ in office.  She is to continue with below plan for warfarin and recheck INR in 3 days.   Warfarin 5mg  :  Take 1 and 1/2 tablets on Thursday, September 7th.  Then continue warfarin 5mg  1 tablet daily   Cherre Robins, PharmD, CPP

## 2016-01-12 ENCOUNTER — Ambulatory Visit
Admission: RE | Admit: 2016-01-12 | Discharge: 2016-01-12 | Disposition: A | Discharge status: Home / Self Care | Payer: Medicare Other | Source: Ambulatory Visit | Attending: Neurology | Admitting: Neurology

## 2016-01-12 DIAGNOSIS — W19XXXA Unspecified fall, initial encounter: Secondary | ICD-10-CM

## 2016-01-12 DIAGNOSIS — I6501 Occlusion and stenosis of right vertebral artery: Secondary | ICD-10-CM | POA: Diagnosis not present

## 2016-01-12 DIAGNOSIS — G3184 Mild cognitive impairment, so stated: Secondary | ICD-10-CM

## 2016-01-12 DIAGNOSIS — I639 Cerebral infarction, unspecified: Secondary | ICD-10-CM

## 2016-01-12 DIAGNOSIS — I6523 Occlusion and stenosis of bilateral carotid arteries: Secondary | ICD-10-CM | POA: Diagnosis not present

## 2016-01-16 ENCOUNTER — Ambulatory Visit (INDEPENDENT_AMBULATORY_CARE_PROVIDER_SITE_OTHER): Payer: Medicare Other | Admitting: Pharmacist

## 2016-01-16 DIAGNOSIS — I481 Persistent atrial fibrillation: Secondary | ICD-10-CM | POA: Diagnosis not present

## 2016-01-16 DIAGNOSIS — I480 Paroxysmal atrial fibrillation: Secondary | ICD-10-CM | POA: Diagnosis not present

## 2016-01-16 DIAGNOSIS — I4819 Other persistent atrial fibrillation: Secondary | ICD-10-CM

## 2016-01-16 LAB — COAGUCHEK XS/INR WAIVED
INR: 1.6 — ABNORMAL HIGH (ref 0.9–1.1)
PROTHROMBIN TIME: 18.8 s

## 2016-01-16 NOTE — Patient Instructions (Signed)
Vitamin K Foods and Warfarin Warfarin is a medicine that helps prevent harmful blood clots by causing blood to clot more slowly. It does this by decreasing the activity of vitamin K, which promotes normal blood clotting. For the dose of warfarin you have been prescribed to work well, you need to get about the same amount of vitamin K from your food from day to day. Suddenly getting a lot more vitamin K could cause your blood to clot too quickly. A sudden decrease in vitamin K intake could cause your blood to clot too slowly. These changes in vitamin K intake could lead to dangerous blood clotsor to bleeding. WHAT GENERAL GUIDELINES DO I NEED TO FOLLOW?  Keep your intake of vitamin K consistent from day to day. To do this, you must be aware of which foods contain moderate or high amounts of vitamin K. Listed below are some foods that are very high, high, or moderately high in vitamin K. If you eat these foods, make sure you eat a consistent amount of them from day to day.  Avoid major changes in your diet, or tell your health care provider before changing your diet.  If you take a multivitamin that contains vitamin K, be sure to take it every day.  If you drink green tea, drink the same amount each day. WHAT FOODS ARE VERY HIGH IN VITAMIN K?   Greens, such as Swiss chard and beet, collard, mustard, or turnip greens (fresh or frozen, cooked).  Kale (fresh or frozen, cooked).   Parsley (raw).  Spinach (cooked).  WHAT FOODS ARE HIGH IN VITAMIN K?  Asparagus (frozen, cooked).  Broccoli.   Bok choy (cooked).   Brussels sprouts (fresh or frozen, cooked).  Cabbage (cooked).  Coleslaw. WHAT FOODS ARE MODERATELY HIGH IN VITAMIN K?  Blueberries.  Black-eyed peas.  Endive (raw).   Green leaf lettuce (raw).   Green scallions (raw).  Kale (raw).  Okra (frozen, cooked).  Plantains (fried).  Romaine lettuce (raw).   Sauerkraut (canned).   Spinach (raw).   This  information is not intended to replace advice given to you by your health care provider. Make sure you discuss any questions you have with your health care provider.   Document Released: 02/17/2009 Document Revised: 05/13/2014 Document Reviewed: 02/24/2013 Elsevier Interactive Patient Education [employer].

## 2016-01-17 DIAGNOSIS — I872 Venous insufficiency (chronic) (peripheral): Secondary | ICD-10-CM | POA: Diagnosis not present

## 2016-01-17 DIAGNOSIS — Z7901 Long term (current) use of anticoagulants: Secondary | ICD-10-CM | POA: Diagnosis not present

## 2016-01-17 DIAGNOSIS — I341 Nonrheumatic mitral (valve) prolapse: Secondary | ICD-10-CM | POA: Diagnosis not present

## 2016-01-17 DIAGNOSIS — I10 Essential (primary) hypertension: Secondary | ICD-10-CM | POA: Diagnosis not present

## 2016-01-17 DIAGNOSIS — M6281 Muscle weakness (generalized): Secondary | ICD-10-CM | POA: Diagnosis not present

## 2016-01-18 DIAGNOSIS — Z124 Encounter for screening for malignant neoplasm of cervix: Secondary | ICD-10-CM | POA: Diagnosis not present

## 2016-01-18 DIAGNOSIS — N390 Urinary tract infection, site not specified: Secondary | ICD-10-CM | POA: Diagnosis not present

## 2016-01-18 DIAGNOSIS — R300 Dysuria: Secondary | ICD-10-CM | POA: Diagnosis not present

## 2016-01-18 DIAGNOSIS — R351 Nocturia: Secondary | ICD-10-CM | POA: Diagnosis not present

## 2016-01-18 DIAGNOSIS — Z6823 Body mass index (BMI) 23.0-23.9, adult: Secondary | ICD-10-CM | POA: Diagnosis not present

## 2016-01-19 ENCOUNTER — Ambulatory Visit (INDEPENDENT_AMBULATORY_CARE_PROVIDER_SITE_OTHER): Payer: Medicare Other | Admitting: Pharmacist Clinician (PhC)/ Clinical Pharmacy Specialist

## 2016-01-19 DIAGNOSIS — I481 Persistent atrial fibrillation: Secondary | ICD-10-CM

## 2016-01-19 DIAGNOSIS — I480 Paroxysmal atrial fibrillation: Secondary | ICD-10-CM | POA: Diagnosis not present

## 2016-01-19 DIAGNOSIS — M6281 Muscle weakness (generalized): Secondary | ICD-10-CM | POA: Diagnosis not present

## 2016-01-19 DIAGNOSIS — I872 Venous insufficiency (chronic) (peripheral): Secondary | ICD-10-CM | POA: Diagnosis not present

## 2016-01-19 DIAGNOSIS — I4819 Other persistent atrial fibrillation: Secondary | ICD-10-CM

## 2016-01-19 DIAGNOSIS — I10 Essential (primary) hypertension: Secondary | ICD-10-CM | POA: Diagnosis not present

## 2016-01-19 DIAGNOSIS — I341 Nonrheumatic mitral (valve) prolapse: Secondary | ICD-10-CM | POA: Diagnosis not present

## 2016-01-19 DIAGNOSIS — Z7901 Long term (current) use of anticoagulants: Secondary | ICD-10-CM | POA: Diagnosis not present

## 2016-01-19 LAB — COAGUCHEK XS/INR WAIVED
INR: 2 — AB (ref 0.9–1.1)
PROTHROMBIN TIME: 24.1 s

## 2016-01-19 NOTE — Patient Instructions (Signed)
Anticoagulation Dose Instructions as of 01/19/2016      Patricia Villa Tue Wed Thu Fri Sat   New Dose 5 mg 5 mg 10 mg 7.5 mg 5 mg 5 mg 5 mg    Description   Take 1 tablet a day except on Wednesdays 1 1/2 tablets.  INR today 2.0

## 2016-01-22 ENCOUNTER — Ambulatory Visit (INDEPENDENT_AMBULATORY_CARE_PROVIDER_SITE_OTHER): Payer: Medicare Other | Admitting: Thoracic Surgery (Cardiothoracic Vascular Surgery)

## 2016-01-22 ENCOUNTER — Ambulatory Visit
Admission: RE | Admit: 2016-01-22 | Discharge: 2016-01-22 | Disposition: A | Discharge status: Home / Self Care | Payer: Medicare Other | Source: Ambulatory Visit | Attending: Thoracic Surgery (Cardiothoracic Vascular Surgery) | Admitting: Thoracic Surgery (Cardiothoracic Vascular Surgery)

## 2016-01-22 ENCOUNTER — Encounter: Payer: Self-pay | Admitting: Thoracic Surgery (Cardiothoracic Vascular Surgery)

## 2016-01-22 VITALS — BP 102/53 | HR 74 | Resp 20 | Ht 64.0 in | Wt 135.0 lb

## 2016-01-22 DIAGNOSIS — I080 Rheumatic disorders of both mitral and aortic valves: Secondary | ICD-10-CM

## 2016-01-22 DIAGNOSIS — I7409 Other arterial embolism and thrombosis of abdominal aorta: Secondary | ICD-10-CM

## 2016-01-22 DIAGNOSIS — I639 Cerebral infarction, unspecified: Secondary | ICD-10-CM | POA: Diagnosis not present

## 2016-01-22 DIAGNOSIS — I481 Persistent atrial fibrillation: Secondary | ICD-10-CM

## 2016-01-22 DIAGNOSIS — Z01818 Encounter for other preprocedural examination: Secondary | ICD-10-CM

## 2016-01-22 DIAGNOSIS — I059 Rheumatic mitral valve disease, unspecified: Secondary | ICD-10-CM | POA: Diagnosis not present

## 2016-01-22 DIAGNOSIS — I700 Atherosclerosis of aorta: Secondary | ICD-10-CM | POA: Diagnosis not present

## 2016-01-22 DIAGNOSIS — I340 Nonrheumatic mitral (valve) insufficiency: Secondary | ICD-10-CM

## 2016-01-22 DIAGNOSIS — I712 Thoracic aortic aneurysm, without rupture: Secondary | ICD-10-CM | POA: Diagnosis not present

## 2016-01-22 DIAGNOSIS — I4819 Other persistent atrial fibrillation: Secondary | ICD-10-CM

## 2016-01-22 MED ORDER — IOPAMIDOL (ISOVUE-370) INJECTION 76%
75.0000 mL | Freq: Once | INTRAVENOUS | Status: AC | PRN
  Administered 2016-01-22: 75 mL via INTRAVENOUS

## 2016-01-22 NOTE — Patient Instructions (Signed)
Continue all previous medications without any changes at this time  

## 2016-01-22 NOTE — Progress Notes (Signed)
[address]       [place] [address]             [phone]     CARDIOTHORACIC SURGERY OFFICE NOTE  Referring Provider is Minus Breeding, MD PCP is Redge Gainer, MD   HPI:  Patient returns to the office today for follow-up of stage D severe symptomatic primary mitral regurgitation, chronic persistent atrial fibrillation, and chronic diastolic congestive heart failure. She was originally seen in consultation on 12/22/2015. Since then she has been seen in follow-up by her neurosurgeon, undergone physical therapy evaluation, EEG, CT angiography, and diagnostic cardiac catheterization. She has been told by her neurosurgeon, Dr. Ellene Route, that her meningioma is calcified and appears to be stable on follow-up imaging. MRI of the brain performed last May did demonstrate a small stroke in the left occipital lobe. EEG reveals diffuse slowing with no sign of seizure-like activity.  On formal physical therapy assessment the patient was noted to have fair to poor balance with increased fall risk, slow walking speed, and poor aerobic tolerance due to severe exertional shortness of breath.  Follow-up CT angiogram of the head and neck revealed no new findings. CT angiogram of the chest, abdomen, and pelvis revealed no contraindications to peripheral arterial cannulation.  Left and right heart catheterization was notable for moderate coronary artery disease with 50% stenosis of the mid left anterior descending coronary artery and otherwise nonobstructive disease.  There was moderate pulmonary hypertension with large V waves on wedge tracing consistent with severe mitral regurgitation. Cardiac output was relatively low with mixed venous oxygen saturation only 47%, cardiac output 3.0 L/m corresponding to cardiac index of 1.8.  She returns to our office with both of her sons present for consultation. She reports no new problems or complaints. She continues to experience exertional shortness of  breath and fatigue with very mild, low level activity. She denies resting shortness of breath. She has never had any chest pain or chest tightness.    Current Outpatient Prescriptions  Medication Sig Dispense Refill  . amLODipine (NORVASC) 2.5 MG tablet Take 1 tablet (2.5 mg total) by mouth daily. 90 tablet 3  . Cholecalciferol (VITAMIN D3) 2000 UNITS TABS Take 2,000 Units by mouth daily.    . clobetasol cream (TEMOVATE) 0.05 % Apply topically 2 (two) times daily. 30 g 0  . DULoxetine (CYMBALTA) 30 MG capsule Take 1 capsule (30 mg total) by mouth daily. 30 capsule 2  . folic acid (FOLVITE) 1 MG tablet Take 1 mg by mouth daily.    . furosemide (LASIX) 40 MG tablet Take 1 tablet (40 mg total) by mouth daily. 90 tablet 3  . lisinopril (PRINIVIL,ZESTRIL) 40 MG tablet Take 1 tablet (40 mg total) by mouth daily. 90 tablet 3  . metoprolol succinate (TOPROL-XL) 25 MG 24 hr tablet Take 0.5 tablets (12.5 mg total) by mouth daily. 45 tablet 3  . polyethylene glycol (MIRALAX / GLYCOLAX) packet Take 17 g by mouth daily as needed (for constipation.).    Marland Kitchen warfarin (COUMADIN) 5 MG tablet Take 1 tablet (5 mg total) by mouth daily. 30 tablet 6   No current facility-administered medications for this visit.       Physical Exam:   BP (!) 102/53 (BP Location: Right Arm, Patient Position: Sitting, Cuff Size: Normal)   Pulse 74   Resp 20   Ht 5\' 4"  (1.626 m)   Wt 135 lb (61.2 kg)   LMP 08/01/1982   SpO2  97% Comment: RA  BMI 23.17 kg/m   General:  Elderly and frail appearing  Chest:   Clear to auscultation  CV:   Irregular rate and rhythm with prominent holosystolic murmur  Incisions:  n/a  Abdomen:  Soft and nontender  Extremities:  Warm and well-perfused  Diagnostic Tests:  Right/Left Heart Cath and Coronary Angiography  Conclusion     Mid LAD lesion, 50 %stenosed.  Hemodynamic findings consistent with mitral valve regurgitation.  LV end diastolic pressure is normal.   1. Nonobstructive  coronary artery disease with moderate stenosis of the mid LAD beyond the second diagonal branch, otherwise angiographically normal coronary arteries 2. Hemodynamic characteristics of severe mitral regurgitation with a large V wave and moderate pulmonary hypertension. 3. Low cardiac output as measured by Fick technique with the pulmonary artery oxygen saturation of 47%, cardiac output of 3.0, and cardiac index of 1.8  The patient will continue with evaluation for mitral valve repair.   Indications   Severe mitral regurgitation [I34.0 (ICD-10-CM)]  Procedural Details/Technique   Technical Details INDICATION: Severe symptomatic mitral regurgitation, preoperative evaluation  PROCEDURAL DETAILS: There was an indwelling IV in a right antecubital vein. Using normal sterile technique, the IV was changed out for a 5 Fr brachial sheath over a 0.018 inch wire. The right wrist was then prepped, draped, and anesthetized with 1% lidocaine. Using the modified Seldinger technique a 5/6 French Slender sheath was placed in the right radial artery. Intra-arterial verapamil was administered through the radial artery sheath. IV heparin was administered after a JR4 catheter was advanced into the central aorta. A Swan-Ganz catheter was used for the right heart catheterization. Standard protocol was followed for recording of right heart pressures and sampling of oxygen saturations. Fick cardiac output was calculated. Standard Judkins catheters were used for selective coronary angiography and abdominal aortic angiography. LV pressure is recorded with the JR4 catheter. There were no immediate procedural complications. The patient was transferred to the post catheterization recovery area for further monitoring.  During this procedure the patient is administered a total of Versed 1 mg to achieve and maintain moderate conscious sedation. The patient's heart rate, blood pressure, and oxygen saturation are monitored continuously  during the procedure. The period of conscious sedation is 38 minutes, of which I was present face-to-face 100% of this time.   Estimated blood loss <50 mL. .    Coronary Findings   Dominance: Left  Left Anterior Descending  Mid LAD lesion, 50% stenosed. There is a focal 50% mid LAD stenosis just after the origin of the second diagonal branch. This portion of the LAD may be intramyocardial. It does not appear to be flow-limiting.  Ramus Intermedius  Tortuous vessel without obstructive disease  Left Circumflex  Dominant circumflex, tortuous branch vessels but no obstruction noted  Right Coronary Artery  Vessel was injected. Vessel is small. Vessel is angiographically normal. Small nondominant RCA without obstructive disease. The vessel was angiographically normal  Right Heart   Right Heart Pressures Hemodynamic findings consistent with mitral valve regurgitation. LV EDP is normal. Large V waves in the pulmonary capillary wedge tracing    Left Heart   Aorta Abdominal Aorta: The abdominal aorta is normal in size.   The abdominal aorta appears normal. The aortoiliac vessels appear normal. The right common femoral artery appears normal with a normal bifurcation site into the SFA and profunda    Coronary Diagrams   Diagnostic Diagram     Implants     No implant documentation  for this case.  PACS Images   Show images for Cardiac catheterization   Link to Procedure Log   Procedure Log    Hemo Data   Flowsheet Row Most Recent Value  Fick Cardiac Output 3.01 L/min  Fick Cardiac Output Index 1.82 (L/min)/BSA  RA A Wave 5 mmHg  RA V Wave 9 mmHg  RA Mean 6 mmHg  RV Systolic Pressure 52 mmHg  RV Diastolic Pressure 1 mmHg  RV EDP 5 mmHg  PA Systolic Pressure 52 mmHg  PA Diastolic Pressure 20 mmHg  PA Mean 34 mmHg  PW A Wave 22 mmHg  PW V Wave 37 mmHg  PW Mean 22 mmHg  AO Systolic Pressure 123456 mmHg  AO Diastolic Pressure 66 mmHg  AO Mean 96 mmHg  LV Systolic Pressure Q000111Q  mmHg  LV Diastolic Pressure 9 mmHg  LV EDP 13 mmHg  Arterial Occlusion Pressure Extended Systolic Pressure Q000111Q mmHg  Arterial Occlusion Pressure Extended Diastolic Pressure 68 mmHg  Arterial Occlusion Pressure Extended Mean Pressure 96 mmHg  Left Ventricular Apex Extended Systolic Pressure Q000111Q mmHg  Left Ventricular Apex Extended Diastolic Pressure 6 mmHg  Left Ventricular Apex Extended EDP Pressure 11 mmHg  QP/QS 1  TPVR Index 18.63 HRUI  TSVR Index 52.6 HRUI  PVR SVR Ratio 0.13  TPVR/TSVR Ratio 0.35    CT ANGIOGRAPHY HEAD AND NECK  TECHNIQUE: Multidetector CT imaging of the head and neck was performed using the standard protocol during bolus administration of intravenous contrast. Multiplanar CT image reconstructions and MIPs were obtained to evaluate the vascular anatomy. Carotid stenosis measurements (when applicable) are obtained utilizing NASCET criteria, using the distal internal carotid diameter as the denominator.  CONTRAST:  100 mL Isovue 370.  COMPARISON:  Head CT without contrast 11/30/2015. Neck CT 10/10/2015. Brain MRI 09/28/2015  FINDINGS: CT HEAD  Brain: Stable non contrast CT appearance of the brain. Patchy bilateral white matter hypodensity. Densely calcified left posterior fossa meningioma (series 3, image 6). Minimal mass effect on the adjacent left lateral cerebellum. No brainstem or cerebellar edema is evident. No acute intracranial hemorrhage identified. No other intracranial mass effect. No ventriculomegaly.  Calvarium and skull base: Stable visualized osseous structures.  Paranasal sinuses: Visualized paranasal sinuses and mastoids are stable and well pneumatized.  Orbits: Stable orbit and scalp soft tissues.  CTA NECK  Skeleton: Stable. Mild anterolisthesis of C4 on C5. Ankylosis of the C2-C3 and C3-C4 posterior elements. Thoracic scoliosis. No acute osseous abnormality identified.  Upper chest: Previously-seen small right  pleural effusion is no longer evident. No superior mediastinal lymphadenopathy.  Other neck: Negative thyroid. Stable larynx with evidence of left vocal cord paralysis. Pharynx contours remain normal. Negative parapharyngeal and retropharyngeal spaces. Negative sublingual space, submandibular glands and parotid glands. No cervical lymphadenopathy.  Aortic arch: Bovine type arch configuration. Mild calcified arch atherosclerosis. No great vessel origin stenosis.  Right carotid system: Negative for age ; minimal right ICA origin and bulb calcified plaque. Tortuous mid cervical right ICA.  Left carotid system: Negative for age. Minimal left ICA bulb calcified plaque. Tortuous distal cervical left ICA.  Vertebral arteries:  No proximal right subclavian artery stenosis despite calcified plaque. Normal right vertebral artery origin. Intermittently tortuous but otherwise normal right vertebral artery to the skullbase.  No proximal left subclavian artery stenosis. Normal left vertebral artery origin. The left vertebral is mildly non dominant. It is tortuous throughout the neck but otherwise negative to the skullbase.  CTA HEAD  Posterior circulation: Very mild calcified plaque  of the right vertebral artery V4 segment with no stenosis. Right PICA origin is normal. Tortuous but otherwise normal vertebrobasilar junction. No stenosis of the slightly non dominant left vertebral artery, which is more diminutive beyond the left PICA origin which remains normal.  No basilar artery stenosis. Patent SCA origins. Normal left PCA origin. Fetal type right PCA origin. Left posterior communicating artery diminutive or absent. Bilateral PCA branches are normal.  Anterior circulation: Both ICA siphons are patent. Mild for age siphon calcified plaque without stenosis. Mild siphon tortuosity. Normal ophthalmic and right posterior communicating artery origins. Normal carotid termini, MCA  and ACA origins. Dominant left A1 segment. Anterior communicating artery and bilateral ACA branches are normal. Left MCA M1 segment, bifurcation, and left MCA branches are within normal limits. Right MCA M1 segment is mildly irregular without stenosis. Right MCA bifurcation, and right MCA branches are within normal limits.  Venous sinuses: Patent.  Anatomic variants: Mildly dominant right vertebral artery. Moderately dominant left ACA A1 segment. Fetal type right PCA origin.  Delayed phase: Enhancement of the left posterior fossa meningioma. No other abnormal intracranial enhancement.  IMPRESSION: 1. No posterior circulation stenosis. Minimal calcified atherosclerosis of the right vertebral V4 segment. Mildly dominant right vertebral artery. Fetal type right PCA origin. 2. Negative for age anterior circulation aside from tortuosity. Mild calcified plaque in the cervical ICAs and ICA siphons without stenosis. 3. Stable CT appearance of the brain. Stable left posterior fossa meningioma. 4. Small right pleural effusions seen in June may have resolved. Stable CT appearance of the neck soft tissues since that time.   Electronically Signed   By: Genevie Ann M.D.   On: 01/12/2016 13:56    CT ANGIOGRAPHY CHEST, ABDOMEN AND PELVIS  TECHNIQUE: Multidetector CT imaging through the chest, abdomen and pelvis was performed using the standard protocol during bolus administration of intravenous contrast. Multiplanar reconstructed images and MIPs were obtained and reviewed to evaluate the vascular anatomy.  CONTRAST:  75 mL of Isovue 370 intravenously.  COMPARISON:  CT exam of June 25, 2013.  FINDINGS: CTA CHEST FINDINGS  Cardiovascular: There is no evidence of thoracic aortic dissection or aneurysm. Great vessels are widely patent. Atherosclerosis is noted.  Mediastinum/Nodes: No mediastinal mass or adenopathy is noted.  Lungs/Pleura: No pneumothorax is noted.  Minimal right pleural effusion is noted. Minimal bibasilar subsegmental atelectasis is noted posteriorly.  Musculoskeletal: No significant osseous abnormality is noted.  Review of the MIP images confirms the above findings.  CTA ABDOMEN AND PELVIS FINDINGS  VASCULAR  Aorta: Atherosclerosis of thoracic aorta is noted without aneurysm or dissection.  Celiac: Patent.  SMA: Patent.  Renals: 2 renal arteries are noted on the right which are widely patent. Two left renal arteries are also noted which are widely patent.  IMA: Patent.  Inflow: Iliac arteries are widely patent without significant stenosis.  Review of the MIP images confirms the above findings.  NON-VASCULAR  Hepatobiliary: No gallstones are noted.  Liver is unremarkable.  Pancreas: Normal.  Spleen: Normal.  Adrenals/Urinary Tract: Adrenal glands and kidneys appear normal. No hydronephrosis or renal obstruction is noted. Urinary bladder appears normal.  Stomach/Bowel: There is no evidence of bowel obstruction. Sigmoid diverticulosis is noted without inflammation.  Lymphatic: No significant adenopathy is noted.  Musculoskeletal: Multilevel degenerative disc disease is noted in the upper lumbar spine. Severe dextroscoliosis of lumbar spine is noted as well.  Other: Status post hysterectomy.  Review of the MIP images confirms the above findings.  IMPRESSION: Atherosclerosis of thoracic  and abdominal aorta is noted without aneurysm or dissection.  Minimal right pleural effusion is noted. Minimal bilateral posterior basilar subsegmental atelectasis is noted.  Sigmoid diverticulosis is noted without inflammation.  Severe dextroscoliosis of lumbar spine is noted with multilevel degenerative disc disease.   Electronically Signed   By: Marijo Conception, M.D.   On: 01/22/2016 12:46   Impression:  Patient has stage D severe symptomatic primary mitral regurgitation,  chronic persistent atrial fibrillation, long-standing hypertension, and chronic diastolic congestive heart failure.  She describes progressive symptoms of exertional shortness of breath, fatigue, and PND consistent with chronic diastolic congestive heart failure, New York Heart Association functional class IIIB.   I have personally reviewed the patient's recent transthoracic and transesophageal echocardiograms. She has myxomatous degenerative disease with at least moderate thickening of both leaflets of the mitral valve and an obvious flail segment of the anterior leaflet with ruptured primary chordae tendineae.  There is severe mitral regurgitation with dilated left atrium, dilated right ventricle, moderate tricuspid regurgitation, and findings suggestive of severe pulmonary hypertension.  Left ventricular systolic function appears normal although she likely has significant diastolic dysfunction.  Diagnostic cardiac catheterization is notable for the presence of 50% discrete stenosis of the mid left anterior descending coronary artery and otherwise mild nonobstructive atherosclerotic disease. The patient had moderate pulmonary hypertension with large V waves on wedge tracing consistent with severe mitral regurgitation.    I feel the patient would best be treated with mitral valve repair, Maze procedure, and possible tricuspid valve repair.  However, the patient appears to be a relatively poor candidate for conventional surgery even if she were approached using minimally invasive techniques.   In addition to her advanced age, she has numerous significant comorbid medical problems including a fairly large meningioma of the cerebellopontine angle which appears stable, vertebral basilar insufficiency, silent embolic stroke to the left occipital lobe noted on MRI of the brain performed last May, chronic memory loss, degenerative arthritis, and unilateral vocal cord paralysis. She is quite frail and not active  physically.  She has poor balance, has suffered several falls, and was notably quite frail on recent formal physical therapy evaluation.    Plan:  I have discussed matters at length with the patient and her 2 sons in the office today. We spent a considerable amount of time discussing treatment options and my concerns for how the patient might do even following uncomplicated mitral valve surgery using conventional surgical techniques.   We will forward a copy of her recent transesophageal echocardiogram to Dr. Bridgette Habermann at [facility] in [place] to consider possible MitraClip edge-to-edge repair.  I spent in excess of 40 minutes during the conduct of this office consultation and >50% of this time involved direct face-to-face encounter with the patient for counseling and/or coordination of their care.    Valentina Gu. Roxy Manns, MD 01/22/2016 3:14 PM

## 2016-01-23 DIAGNOSIS — Z7901 Long term (current) use of anticoagulants: Secondary | ICD-10-CM | POA: Diagnosis not present

## 2016-01-23 DIAGNOSIS — I872 Venous insufficiency (chronic) (peripheral): Secondary | ICD-10-CM | POA: Diagnosis not present

## 2016-01-23 DIAGNOSIS — M6281 Muscle weakness (generalized): Secondary | ICD-10-CM | POA: Diagnosis not present

## 2016-01-23 DIAGNOSIS — I341 Nonrheumatic mitral (valve) prolapse: Secondary | ICD-10-CM | POA: Diagnosis not present

## 2016-01-23 DIAGNOSIS — I10 Essential (primary) hypertension: Secondary | ICD-10-CM | POA: Diagnosis not present

## 2016-01-24 DIAGNOSIS — M6281 Muscle weakness (generalized): Secondary | ICD-10-CM | POA: Diagnosis not present

## 2016-01-24 DIAGNOSIS — I10 Essential (primary) hypertension: Secondary | ICD-10-CM | POA: Diagnosis not present

## 2016-01-24 DIAGNOSIS — I341 Nonrheumatic mitral (valve) prolapse: Secondary | ICD-10-CM | POA: Diagnosis not present

## 2016-01-24 DIAGNOSIS — Z7901 Long term (current) use of anticoagulants: Secondary | ICD-10-CM | POA: Diagnosis not present

## 2016-01-24 DIAGNOSIS — I872 Venous insufficiency (chronic) (peripheral): Secondary | ICD-10-CM | POA: Diagnosis not present

## 2016-01-25 ENCOUNTER — Telehealth: Payer: Self-pay

## 2016-01-25 NOTE — Telephone Encounter (Signed)
I spoke to sone (per DPR) and gave results below.

## 2016-01-25 NOTE — Telephone Encounter (Signed)
-----   Message from Garvin Fila, MD sent at 01/25/2016  4:10 PM EDT ----- Patricia Villa inform the patient that CT scan angiogram of the neck and the brain does not show any major large vessel stenosis or occlusion to worry about

## 2016-02-01 DIAGNOSIS — I5032 Chronic diastolic (congestive) heart failure: Secondary | ICD-10-CM | POA: Diagnosis not present

## 2016-02-01 DIAGNOSIS — Z8673 Personal history of transient ischemic attack (TIA), and cerebral infarction without residual deficits: Secondary | ICD-10-CM | POA: Diagnosis not present

## 2016-02-01 DIAGNOSIS — I4891 Unspecified atrial fibrillation: Secondary | ICD-10-CM | POA: Diagnosis not present

## 2016-02-01 DIAGNOSIS — I2510 Atherosclerotic heart disease of native coronary artery without angina pectoris: Secondary | ICD-10-CM | POA: Diagnosis not present

## 2016-02-01 DIAGNOSIS — I340 Nonrheumatic mitral (valve) insufficiency: Secondary | ICD-10-CM | POA: Diagnosis not present

## 2016-02-05 ENCOUNTER — Encounter: Payer: Medicare Other | Admitting: Thoracic Surgery (Cardiothoracic Vascular Surgery)

## 2016-02-08 ENCOUNTER — Telehealth: Payer: Self-pay | Admitting: Cardiology

## 2016-02-08 NOTE — Telephone Encounter (Signed)
Received records from [facility] for appointment on 04/03/16 with Dr Percival Spanish.  Records given to [facility] (medical records) for Dr Hochrein's schedule on 04/03/16. lp

## 2016-02-12 ENCOUNTER — Telehealth: Payer: Self-pay | Admitting: Pharmacist

## 2016-02-12 NOTE — Telephone Encounter (Signed)
Patient has appt to have INR checked tomorrow

## 2016-02-13 ENCOUNTER — Ambulatory Visit (INDEPENDENT_AMBULATORY_CARE_PROVIDER_SITE_OTHER): Payer: Medicare Other | Admitting: Pharmacist

## 2016-02-13 DIAGNOSIS — I481 Persistent atrial fibrillation: Secondary | ICD-10-CM

## 2016-02-13 DIAGNOSIS — I480 Paroxysmal atrial fibrillation: Secondary | ICD-10-CM

## 2016-02-13 DIAGNOSIS — I4819 Other persistent atrial fibrillation: Secondary | ICD-10-CM

## 2016-02-13 LAB — COAGUCHEK XS/INR WAIVED
INR: 1.1 (ref 0.9–1.1)
PROTHROMBIN TIME: 13.7 s

## 2016-02-14 ENCOUNTER — Encounter: Payer: Self-pay | Admitting: Pharmacist

## 2016-02-14 ENCOUNTER — Encounter: Payer: Self-pay | Admitting: Cardiology

## 2016-02-14 DIAGNOSIS — Z882 Allergy status to sulfonamides status: Secondary | ICD-10-CM | POA: Diagnosis not present

## 2016-02-14 DIAGNOSIS — Z7901 Long term (current) use of anticoagulants: Secondary | ICD-10-CM | POA: Diagnosis not present

## 2016-02-14 DIAGNOSIS — J9621 Acute and chronic respiratory failure with hypoxia: Secondary | ICD-10-CM | POA: Diagnosis not present

## 2016-02-14 DIAGNOSIS — I482 Chronic atrial fibrillation: Secondary | ICD-10-CM | POA: Diagnosis not present

## 2016-02-14 DIAGNOSIS — F419 Anxiety disorder, unspecified: Secondary | ICD-10-CM | POA: Diagnosis present

## 2016-02-14 DIAGNOSIS — I511 Rupture of chordae tendineae, not elsewhere classified: Secondary | ICD-10-CM | POA: Diagnosis not present

## 2016-02-14 DIAGNOSIS — I517 Cardiomegaly: Secondary | ICD-10-CM | POA: Diagnosis not present

## 2016-02-14 DIAGNOSIS — M545 Low back pain: Secondary | ICD-10-CM | POA: Diagnosis present

## 2016-02-14 DIAGNOSIS — I10 Essential (primary) hypertension: Secondary | ICD-10-CM | POA: Diagnosis present

## 2016-02-14 DIAGNOSIS — I371 Nonrheumatic pulmonary valve insufficiency: Secondary | ICD-10-CM | POA: Diagnosis not present

## 2016-02-14 DIAGNOSIS — I083 Combined rheumatic disorders of mitral, aortic and tricuspid valves: Secondary | ICD-10-CM | POA: Diagnosis not present

## 2016-02-14 DIAGNOSIS — I351 Nonrheumatic aortic (valve) insufficiency: Secondary | ICD-10-CM | POA: Diagnosis not present

## 2016-02-14 DIAGNOSIS — I340 Nonrheumatic mitral (valve) insufficiency: Secondary | ICD-10-CM | POA: Diagnosis not present

## 2016-02-14 DIAGNOSIS — I2510 Atherosclerotic heart disease of native coronary artery without angina pectoris: Secondary | ICD-10-CM | POA: Diagnosis present

## 2016-02-14 DIAGNOSIS — I2722 Pulmonary hypertension due to left heart disease: Secondary | ICD-10-CM | POA: Diagnosis not present

## 2016-02-14 DIAGNOSIS — E785 Hyperlipidemia, unspecified: Secondary | ICD-10-CM | POA: Diagnosis present

## 2016-02-14 DIAGNOSIS — Z86011 Personal history of benign neoplasm of the brain: Secondary | ICD-10-CM | POA: Diagnosis not present

## 2016-02-14 DIAGNOSIS — G8929 Other chronic pain: Secondary | ICD-10-CM | POA: Diagnosis present

## 2016-02-14 DIAGNOSIS — Z888 Allergy status to other drugs, medicaments and biological substances status: Secondary | ICD-10-CM | POA: Diagnosis not present

## 2016-02-14 DIAGNOSIS — I2729 Other secondary pulmonary hypertension: Secondary | ICD-10-CM | POA: Diagnosis present

## 2016-02-14 DIAGNOSIS — I5033 Acute on chronic diastolic (congestive) heart failure: Secondary | ICD-10-CM | POA: Diagnosis not present

## 2016-02-14 DIAGNOSIS — Z006 Encounter for examination for normal comparison and control in clinical research program: Secondary | ICD-10-CM | POA: Diagnosis not present

## 2016-02-14 DIAGNOSIS — M810 Age-related osteoporosis without current pathological fracture: Secondary | ICD-10-CM | POA: Diagnosis present

## 2016-02-14 DIAGNOSIS — Z8673 Personal history of transient ischemic attack (TIA), and cerebral infarction without residual deficits: Secondary | ICD-10-CM | POA: Diagnosis not present

## 2016-02-14 DIAGNOSIS — I272 Pulmonary hypertension, unspecified: Secondary | ICD-10-CM | POA: Diagnosis present

## 2016-02-15 ENCOUNTER — Encounter: Payer: Self-pay | Admitting: *Deleted

## 2016-02-18 ENCOUNTER — Other Ambulatory Visit: Payer: Self-pay | Admitting: Family Medicine

## 2016-02-18 DIAGNOSIS — J9621 Acute and chronic respiratory failure with hypoxia: Secondary | ICD-10-CM | POA: Diagnosis not present

## 2016-02-18 DIAGNOSIS — I272 Pulmonary hypertension, unspecified: Secondary | ICD-10-CM | POA: Diagnosis not present

## 2016-02-18 DIAGNOSIS — I110 Hypertensive heart disease with heart failure: Secondary | ICD-10-CM | POA: Diagnosis not present

## 2016-02-18 DIAGNOSIS — I340 Nonrheumatic mitral (valve) insufficiency: Secondary | ICD-10-CM | POA: Diagnosis not present

## 2016-02-18 DIAGNOSIS — I5033 Acute on chronic diastolic (congestive) heart failure: Secondary | ICD-10-CM | POA: Diagnosis not present

## 2016-02-18 DIAGNOSIS — M6281 Muscle weakness (generalized): Secondary | ICD-10-CM | POA: Diagnosis not present

## 2016-02-20 ENCOUNTER — Ambulatory Visit (INDEPENDENT_AMBULATORY_CARE_PROVIDER_SITE_OTHER): Payer: Medicare Other | Admitting: Pharmacist

## 2016-02-20 DIAGNOSIS — I340 Nonrheumatic mitral (valve) insufficiency: Secondary | ICD-10-CM | POA: Diagnosis not present

## 2016-02-20 DIAGNOSIS — I481 Persistent atrial fibrillation: Secondary | ICD-10-CM

## 2016-02-20 DIAGNOSIS — I480 Paroxysmal atrial fibrillation: Secondary | ICD-10-CM

## 2016-02-20 DIAGNOSIS — I5033 Acute on chronic diastolic (congestive) heart failure: Secondary | ICD-10-CM | POA: Diagnosis not present

## 2016-02-20 DIAGNOSIS — I110 Hypertensive heart disease with heart failure: Secondary | ICD-10-CM | POA: Diagnosis not present

## 2016-02-20 DIAGNOSIS — I272 Pulmonary hypertension, unspecified: Secondary | ICD-10-CM | POA: Diagnosis not present

## 2016-02-20 DIAGNOSIS — I4819 Other persistent atrial fibrillation: Secondary | ICD-10-CM

## 2016-02-20 DIAGNOSIS — J9621 Acute and chronic respiratory failure with hypoxia: Secondary | ICD-10-CM | POA: Diagnosis not present

## 2016-02-20 DIAGNOSIS — M6281 Muscle weakness (generalized): Secondary | ICD-10-CM | POA: Diagnosis not present

## 2016-02-20 LAB — COAGUCHEK XS/INR WAIVED
INR: 1.3 — AB (ref 0.9–1.1)
Prothrombin Time: 15.6 s

## 2016-02-21 DIAGNOSIS — I272 Pulmonary hypertension, unspecified: Secondary | ICD-10-CM | POA: Diagnosis not present

## 2016-02-21 DIAGNOSIS — I110 Hypertensive heart disease with heart failure: Secondary | ICD-10-CM | POA: Diagnosis not present

## 2016-02-21 DIAGNOSIS — M6281 Muscle weakness (generalized): Secondary | ICD-10-CM | POA: Diagnosis not present

## 2016-02-21 DIAGNOSIS — J9621 Acute and chronic respiratory failure with hypoxia: Secondary | ICD-10-CM | POA: Diagnosis not present

## 2016-02-21 DIAGNOSIS — I5033 Acute on chronic diastolic (congestive) heart failure: Secondary | ICD-10-CM | POA: Diagnosis not present

## 2016-02-21 DIAGNOSIS — I340 Nonrheumatic mitral (valve) insufficiency: Secondary | ICD-10-CM | POA: Diagnosis not present

## 2016-02-22 ENCOUNTER — Telehealth: Payer: Self-pay

## 2016-02-22 ENCOUNTER — Telehealth: Payer: Self-pay | Admitting: Cardiology

## 2016-02-22 ENCOUNTER — Telehealth: Payer: Self-pay | Admitting: *Deleted

## 2016-02-22 DIAGNOSIS — I340 Nonrheumatic mitral (valve) insufficiency: Secondary | ICD-10-CM | POA: Diagnosis not present

## 2016-02-22 DIAGNOSIS — I5033 Acute on chronic diastolic (congestive) heart failure: Secondary | ICD-10-CM | POA: Diagnosis not present

## 2016-02-22 DIAGNOSIS — I110 Hypertensive heart disease with heart failure: Secondary | ICD-10-CM | POA: Diagnosis not present

## 2016-02-22 DIAGNOSIS — M6281 Muscle weakness (generalized): Secondary | ICD-10-CM | POA: Diagnosis not present

## 2016-02-22 DIAGNOSIS — I272 Pulmonary hypertension, unspecified: Secondary | ICD-10-CM | POA: Diagnosis not present

## 2016-02-22 DIAGNOSIS — J9621 Acute and chronic respiratory failure with hypoxia: Secondary | ICD-10-CM | POA: Diagnosis not present

## 2016-02-22 NOTE — Telephone Encounter (Signed)
New Message   Chevis Pretty pt PT call to inform provide pt bp around 12 noon was 70/50.Marland Kitchen Loma Sousa states she has been there for a hour and has gotten the bp up to 80/58. Please call back to discuss if needed

## 2016-02-22 NOTE — Telephone Encounter (Signed)
Continue to monitor blood pressure. If it keeps running low and we cannot encourage her to drink more fluids with may have to reduce the Lasix. Following this recent procedure we should probably discuss this with the cardiologist first. Please let the home health nurse know this.

## 2016-02-22 NOTE — Telephone Encounter (Signed)
Spoke with the [facility] nurse and she thinks that pt is not drinking enough fluids. She was 80/50 and after 2 cups of water it was 103/60 She is currently on 3 BP meds:  Amlodipine 2.5 a day Metoprolol 12.5 a day Lisinopril 40 a day  Also on Lasix 40 a day.  She thinks we may need to decrease some of her meds. DWM to address

## 2016-02-22 NOTE — Telephone Encounter (Signed)
Received a call from patient's son Doren Custard.Stated he noticed mother had a missed call on her phone.Advised I spoke to her home PT and home health nurse Benjamine Mola earlier about her low B/P.He stated he was already told to decrease lasix to 20 mg daily.Stated mother had a heart procedure done in [place] last week.Advised to have records faxed to [place] before her appointment with him 02/28/16 at 10:15 am.

## 2016-02-22 NOTE — Telephone Encounter (Signed)
I called and left this message on the [facility] nurse VM  - she should contact cardio

## 2016-02-22 NOTE — Telephone Encounter (Signed)
Call from nurse late yesterday. Patients BP was 80/50 and she is having some dizziness when standing. Please call nurse with any new orders

## 2016-02-22 NOTE — Telephone Encounter (Signed)
Returned call to [place] PT.She stated patient has been running a low B/P since yesterday.B/P ranging 104/60,70/50,80/58.Pulse 68 to 74.Spoke to DOD Dr.Kelly he advised to decrease Lasix back to 20 mg daily.Advised to keep appointment with Dr.Hochrein as planned 02/28/16.Spoke to patient's home health nurse Benjamine Mola, was made aware to decrease Lasix back to 20 mg daily.

## 2016-02-23 DIAGNOSIS — I340 Nonrheumatic mitral (valve) insufficiency: Secondary | ICD-10-CM | POA: Diagnosis not present

## 2016-02-23 DIAGNOSIS — I110 Hypertensive heart disease with heart failure: Secondary | ICD-10-CM | POA: Diagnosis not present

## 2016-02-23 DIAGNOSIS — J9621 Acute and chronic respiratory failure with hypoxia: Secondary | ICD-10-CM | POA: Diagnosis not present

## 2016-02-23 DIAGNOSIS — I272 Pulmonary hypertension, unspecified: Secondary | ICD-10-CM | POA: Diagnosis not present

## 2016-02-23 DIAGNOSIS — I5033 Acute on chronic diastolic (congestive) heart failure: Secondary | ICD-10-CM | POA: Diagnosis not present

## 2016-02-23 DIAGNOSIS — M6281 Muscle weakness (generalized): Secondary | ICD-10-CM | POA: Diagnosis not present

## 2016-02-26 ENCOUNTER — Telehealth: Payer: Self-pay | Admitting: Cardiology

## 2016-02-26 NOTE — Telephone Encounter (Signed)
Received records from [facility] for appointment on 02/28/16 with Dr Percival Spanish.  Records given to [employer] (medical records) for Dr Hochrein's schedule on 02/28/16. lp

## 2016-02-27 ENCOUNTER — Encounter: Payer: Self-pay | Admitting: Pharmacist

## 2016-02-27 DIAGNOSIS — J9621 Acute and chronic respiratory failure with hypoxia: Secondary | ICD-10-CM | POA: Diagnosis not present

## 2016-02-27 DIAGNOSIS — I110 Hypertensive heart disease with heart failure: Secondary | ICD-10-CM | POA: Diagnosis not present

## 2016-02-27 DIAGNOSIS — I340 Nonrheumatic mitral (valve) insufficiency: Secondary | ICD-10-CM | POA: Diagnosis not present

## 2016-02-27 DIAGNOSIS — I272 Pulmonary hypertension, unspecified: Secondary | ICD-10-CM | POA: Diagnosis not present

## 2016-02-27 DIAGNOSIS — M6281 Muscle weakness (generalized): Secondary | ICD-10-CM | POA: Diagnosis not present

## 2016-02-27 DIAGNOSIS — I5033 Acute on chronic diastolic (congestive) heart failure: Secondary | ICD-10-CM | POA: Diagnosis not present

## 2016-02-27 NOTE — Progress Notes (Signed)
HPI The patient presents for followup of MR and atrial fib.  Prior to my last visit with her I sent her for an echocardiogram to follow up MR.  This was more severe than previous with an apparent partially flail anterior leaflet with moderate to severe eccentric MR.   There was also moderately severe TR with elevated pulmonary pressures.    She has since had TEE which confirms ruptured chords with severe flail motion of the anterior leaflet.  She was seen by Dr. Burt Knack and Dr. Roxy Manns.  She had an extensive discussion to dedice on the best course of therapy for her MV.  Left and right heart catheterization was notable for moderate coronary artery disease with 50% stenosis of the mid left anterior descending coronary artery and otherwise nonobstructive disease.  There was moderate pulmonary hypertension with large V waves on wedge tracing consistent with severe mitral regurgitation. Cardiac output was relatively low with mixed venous oxygen saturation only 47%, cardiac output 3.0 L/m corresponding to cardiac index of 1.8.   She was subsequently sent to Dr. Bridgette Habermann at [facility].    I don't yet have the CCM records but I did speak with Dr. Bridgette Habermann.   The patient is feeling weak. She has home oxygen when necessary. She has a physical therapist and nurse is coming to the house. Her blood pressures have been low with systolics in the 0000000 and she feels lightheaded with this. She's not had any presyncope or syncope. She still mostly concerned about hoarseness and difficulty swallowing. She's not describing new shortness of breath, PND or orthopnea. Her weights have been stable. She's not having any new chest pressure, neck or arm discomfort.  Allergies  Allergen Reactions  . Actonel [Risedronate Sodium] Other (See Comments)    Esophagitis   . Diphenhydramine Hcl Hives  . Fosamax [Alendronate Sodium] Other (See Comments)    Esophagitis   . Livalo [Pitavastatin Calcium] Other (See Comments)    Pt doesn't remember  reaction  . Pravastatin Other (See Comments)    Extreme leg cramping.  . Sulfa Antibiotics Other (See Comments)    Pt doesn't remember reaction  . Zocor [Simvastatin] Other (See Comments)    Pt doesn't remember reaction    Current Outpatient Prescriptions  Medication Sig Dispense Refill  . amLODipine (NORVASC) 2.5 MG tablet Take 1 tablet (2.5 mg total) by mouth daily. 90 tablet 3  . Cholecalciferol (VITAMIN D3) 2000 UNITS TABS Take 2,000 Units by mouth daily.    . DULoxetine (CYMBALTA) 30 MG capsule TAKE ONE CAPSULE BY MOUTH ONCE DAILY 30 capsule 0  . folic acid (FOLVITE) 1 MG tablet Take 1 mg by mouth daily.    . furosemide (LASIX) 40 MG tablet Take 1/2 tablet by mouth ( 20 mg ) daily 90 tablet 3  . lisinopril (PRINIVIL,ZESTRIL) 40 MG tablet Take 1 tablet (40 mg total) by mouth daily. 90 tablet 3  . metoprolol succinate (TOPROL-XL) 25 MG 24 hr tablet Take 0.5 tablets (12.5 mg total) by mouth daily. 45 tablet 3  . warfarin (COUMADIN) 5 MG tablet Take 1 tablet (5 mg total) by mouth daily. 30 tablet 6   No current facility-administered medications for this visit.     Past Medical History:  Diagnosis Date  . Alopecia   . Anxiety   . Arthritis   . Atrial fibrillation, persistent ([place]) 10/30/2015  . Cataract   . DDD (degenerative disc disease), cervical   . Diverticulosis of colon   . Esotropia   .  History of double vision   . Hyperlipidemia   . Hypertension   . Hypertrophic cardiomyopathy ([place])    by history but not identified on the most recent echo  . Meningioma (HCC)    cerebellopontine angle   . Mild mitral valve prolapse   . Mitral insufficiency    Severe with anterior flail leaflet.    . Osteoporosis   . Postmenopausal HRT (hormone replacement therapy)   . 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 Vertebrobasilar insufficiency   . Vocal cord paralysis, unilateral complete     Past Surgical History:  Procedure Laterality  Date  . ABDOMINAL HYSTERECTOMY    . CARDIAC CATHETERIZATION N/A 01/02/2016   Procedure: Right/Left Heart Cath and Coronary Angiography;  Surgeon: Sherren Mocha, MD;  Location: [facility];  Service: Cardiovascular;  Laterality: N/A;  . CATARACT EXTRACTION, BILATERAL    . EYE SURGERY    . TEE WITHOUT CARDIOVERSION N/A 12/12/2015   Procedure: TRANSESOPHAGEAL ECHOCARDIOGRAM (TEE);  Surgeon: Lelon Perla, MD;  Location: [facility] ENDOSCOPY;  Service: Cardiovascular;  Laterality: N/A;  . TONSILLECTOMY    . VEIN SURGERY      ROS:  As stated in the HPI and negative for all other systems.  PHYSICAL EXAM BP 118/62   Pulse 72   Ht 5\' 4"  (1.626 m)   Wt 137 lb (62.1 kg)   LMP 08/01/1982   BMI 23.52 kg/m  GENERAL:  Well appearing but increasingly frail NECK:  Positive jugular venous distention, waveform within normal limits, carotid upstroke brisk and symmetric, no bruits, no thyromegaly LUNGS:  Clear to auscultation bilaterally CHEST:  Unremarkable HEART:  PMI not displaced or sustained,S1 and S2 within normal limits, no S3,  no clicks, no rubs,  2 out of 6 apical holosystolic murmur, no diastolic murmurs, irregular ABD:  Flat, positive bowel sounds normal in frequency in pitch, no bruits, no rebound, no guarding, no midline pulsatile mass, no hepatomegaly, no splenomegaly EXT:  2 plus pulses throughout, no edema, no cyanosis no clubbing, right femoral access site with slight ecchymosis and bruit SKIN:  No rashes no nodules  Lab Results  Component Value Date   TSH 0.780 12/20/2015   Lab Results  Component Value Date   WBC 7.1 01/02/2016   HGB 11.4 (L) 01/02/2016   HCT 37.5 01/02/2016   MCV 94.2 01/02/2016   PLT 229 01/02/2016    ASSESSMENT AND PLAN  ATRIAL FIB:  The patient tolerates this. She's tolerating anticoagulation. She has reasonable rate control. Given the size of the left atrium I would suspect that cardioversion would either be unsuccessful or not successful long-term.  She will continue with rate control and anticoagulation.  Patricia Villa has a CHA2DS2 - VASc score of 4 with a risk of stroke of 4%.    MITRAL INSUFFICIENCY:  She has follow-up at  [facility] in mid November for TEE. I will look forward to getting these records. We don't have access through our system.   HTN:  The blood pressure is running low he is lightheaded. I will reduce her lisinopril to 20 mg daily.  HOARSENESS:  I've encouraged her now to follow-up with [facility] where she was being evaluated now that she's had her cardiac situation addressed.

## 2016-02-28 ENCOUNTER — Ambulatory Visit (INDEPENDENT_AMBULATORY_CARE_PROVIDER_SITE_OTHER): Payer: Medicare Other | Admitting: Pharmacist

## 2016-02-28 ENCOUNTER — Encounter: Payer: Self-pay | Admitting: Cardiology

## 2016-02-28 ENCOUNTER — Ambulatory Visit (INDEPENDENT_AMBULATORY_CARE_PROVIDER_SITE_OTHER): Payer: Medicare Other | Admitting: Cardiology

## 2016-02-28 VITALS — BP 118/62 | HR 72 | Ht 64.0 in | Wt 137.0 lb

## 2016-02-28 DIAGNOSIS — I481 Persistent atrial fibrillation: Secondary | ICD-10-CM

## 2016-02-28 DIAGNOSIS — I9581 Postprocedural hypotension: Secondary | ICD-10-CM

## 2016-02-28 DIAGNOSIS — I4819 Other persistent atrial fibrillation: Secondary | ICD-10-CM

## 2016-02-28 DIAGNOSIS — I080 Rheumatic disorders of both mitral and aortic valves: Secondary | ICD-10-CM

## 2016-02-28 DIAGNOSIS — I639 Cerebral infarction, unspecified: Secondary | ICD-10-CM

## 2016-02-28 DIAGNOSIS — I480 Paroxysmal atrial fibrillation: Secondary | ICD-10-CM

## 2016-02-28 LAB — COAGUCHEK XS/INR WAIVED
INR: 2.2 — AB (ref 0.9–1.1)
PROTHROMBIN TIME: 26 s

## 2016-02-28 MED ORDER — LISINOPRIL 20 MG PO TABS: 20.0000 mg | ORAL_TABLET | Freq: Every day | ORAL | 3 refills | Status: DC

## 2016-02-28 NOTE — Patient Instructions (Signed)
Medication Instructions:  Please decrease your Lisinopril to 20 mg a day. Continue all other medications as listed.  Follow-Up: Follow up in 3 months with Dr Percival Spanish in [place].  If you need a refill on your cardiac medications before your next appointment, please call your pharmacy.  Thank you for choosing [facility]!!

## 2016-02-29 ENCOUNTER — Encounter: Payer: Self-pay | Admitting: Neurology

## 2016-02-29 ENCOUNTER — Ambulatory Visit (INDEPENDENT_AMBULATORY_CARE_PROVIDER_SITE_OTHER): Payer: Medicare Other | Admitting: Neurology

## 2016-02-29 VITALS — BP 115/67 | HR 77 | Wt 138.4 lb

## 2016-02-29 DIAGNOSIS — I110 Hypertensive heart disease with heart failure: Secondary | ICD-10-CM | POA: Diagnosis not present

## 2016-02-29 DIAGNOSIS — J9621 Acute and chronic respiratory failure with hypoxia: Secondary | ICD-10-CM | POA: Diagnosis not present

## 2016-02-29 DIAGNOSIS — R413 Other amnesia: Secondary | ICD-10-CM | POA: Diagnosis not present

## 2016-02-29 DIAGNOSIS — I272 Pulmonary hypertension, unspecified: Secondary | ICD-10-CM | POA: Diagnosis not present

## 2016-02-29 DIAGNOSIS — I639 Cerebral infarction, unspecified: Secondary | ICD-10-CM | POA: Diagnosis not present

## 2016-02-29 DIAGNOSIS — I5033 Acute on chronic diastolic (congestive) heart failure: Secondary | ICD-10-CM | POA: Diagnosis not present

## 2016-02-29 DIAGNOSIS — M6281 Muscle weakness (generalized): Secondary | ICD-10-CM | POA: Diagnosis not present

## 2016-02-29 DIAGNOSIS — I340 Nonrheumatic mitral (valve) insufficiency: Secondary | ICD-10-CM | POA: Diagnosis not present

## 2016-02-29 MED ORDER — DONEPEZIL HCL 10 MG PO TABS: 10.0000 mg | ORAL_TABLET | Freq: Every day | ORAL | 3 refills | Status: DC

## 2016-02-29 NOTE — Progress Notes (Signed)
[facility] [address] [place]. [place] [address] [phone]       OFFICE CONSULT NOTE  Patricia. Patricia Villa Date of Birth:  Aug 26, 1932 Medical Record Number:  [id number]   Referring MD:   Redge Gainer Reason for Referral:   falls  [id number] Consult 12/20/15 :  Patricia Villa is a 63 year pleasant Caucasian lady who is accompanied by her son today. She has had 3 falls over the last 6 weeks. She states that the fall mostly occur when she is getting up and looking up or leaning backwards. All the fall she is fallen backwards. She has never lost consciousness. She feels off balance. She denies nausea, vomiting vertigo loss of vision or blurred vision. She had is MRI scan of the brain on 5/25 for 17 which are personally reviewed shows left cerebellar pontine angle meningioma which is stable compared with prior scans and there is a small enhancing area of the left occipital cortex which may represent possible subacute infarct. The patient complains of long-standing tinnitus and slight decreased hearing on the left for more than 10 years but denies that this has progressed recently. She also has long-standing history of memory loss and word finding difficulties last several years. She states she was tried on Aricept but did not tolerate it. She has not tried medications like Namenda but is not keen to try new medicines. She also has chronic diplopia for which she has seen Dr. Thornton Park [facility] and prescribed prism glasses which seem to help. She passes a cardiac surgeon October and cardiac valvuloplasty soon. Patient denies any tingling numbness and burning in her pain. She denies any neck pain and radicular pain. She has no history of falls with head injury a significant loss of consciousness. She has no known prior history of strokes TIAs seizures. She denies peripheral vision loss on the right of bumping into objects. She has history of atrial fibrillation  and is on long-term anticoagulation with warfarin. Update 02/29/2016 : She returns for follow-up after last visit 2 months ago. She is accompanied by husband. Patient continues to have memory difficulties and unchanged. She has not thought about trying Aricept and is willing to do so. She states she may have tried it a few years ago but her husband is not sure about this. She had lab work done for reversible causes of memory loss including vitamin B12 and TSH which were normal. CT angiogram of the brain and neck showed no significant intra-or extracranial posterior circulation stenosis. She had mitral valve annuloplasty in [place] by Dr Bridgette Habermann.. She is on warfarin and his getting easy bruisability but no bleeding episodes. Her blood pressure is well controlled and today it is 115/67. She continues to have a hoarse voice due to vocal cord palsy. She did see Dr. Ellene Route for meningioma who recommended conservative management and no treatment. ROS:   14 system review of systems is positive for   palpitations, murmur, memory loss, dizziness, neck pain, heart surgery and all other systems negative  PMH:  Past Medical History:  Diagnosis Date  . Alopecia   . Anxiety   . Arthritis   . Atrial fibrillation, persistent ([place]) 10/30/2015  . Cataract   . DDD (degenerative disc disease), cervical   . Diverticulosis of colon   . Esotropia   . History of double vision   . Hyperlipidemia   . Hypertension   . Hypertrophic cardiomyopathy ([place])    by history but not identified  on the most recent echo  . Meningioma (HCC)    cerebellopontine angle   . Mild mitral valve prolapse   . Mitral insufficiency    Severe with anterior flail leaflet.    . Osteoporosis   . Postmenopausal HRT (hormone replacement therapy)   . 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 Vertebrobasilar insufficiency   . Vocal cord paralysis, unilateral complete     Social History:  Social  History   Social History  . Marital status: Married    Spouse name: N/A  . Number of children: N/A  . Years of education: N/A   Occupational History  . Not on file.   Social History Main Topics  . Smoking status: Never Smoker  . Smokeless tobacco: Never Used     Comment: Rare previously  . Alcohol use No  . Drug use: No  . Sexual activity: No   Other Topics Concern  . Not on file   Social History Narrative  . No narrative on file    Medications:   Current Outpatient Prescriptions on File Prior to Visit  Medication Sig Dispense Refill  . amLODipine (NORVASC) 2.5 MG tablet Take 1 tablet (2.5 mg total) by mouth daily. 90 tablet 3  . Cholecalciferol (VITAMIN D3) 2000 UNITS TABS Take 2,000 Units by mouth daily.    . DULoxetine (CYMBALTA) 30 MG capsule TAKE ONE CAPSULE BY MOUTH ONCE DAILY 30 capsule 0  . folic acid (FOLVITE) 1 MG tablet Take 1 mg by mouth daily.    . furosemide (LASIX) 40 MG tablet 20 mg. Take 1/2 tablet by mouth ( 20 mg ) daily 90 tablet 3  . lisinopril (PRINIVIL,ZESTRIL) 20 MG tablet Take 1 tablet (20 mg total) by mouth daily. 90 tablet 3  . metoprolol succinate (TOPROL-XL) 25 MG 24 hr tablet Take 0.5 tablets (12.5 mg total) by mouth daily. 45 tablet 3  . warfarin (COUMADIN) 5 MG tablet Take 1 tablet (5 mg total) by mouth daily. 30 tablet 6   No current facility-administered medications on file prior to visit.     Allergies:   Allergies  Allergen Reactions  . Actonel [Risedronate Sodium] Other (See Comments)    Esophagitis   . Diphenhydramine Hcl Hives  . Fosamax [Alendronate Sodium] Other (See Comments)    Esophagitis   . Livalo [Pitavastatin Calcium] Other (See Comments)    Pt doesn't remember reaction  . Pravastatin Other (See Comments)    Extreme leg cramping.  . Sulfa Antibiotics Other (See Comments)    Pt doesn't remember reaction  . Zocor [Simvastatin] Other (See Comments)    Pt doesn't remember reaction    Physical Exam General: Frail  elderly Caucasian lady seated, in no evident distress Head: head normocephalic and atraumatic.   Neck: supple with no carotid or supraclavicular bruits Cardiovascular: regular rate and rhythm, no murmurs Musculoskeletal: no deformity Skin:  no rash/petichiae Vascular:  Normal pulses all extremities  Neurologic Exam Mental Status: Awake and fully alert. Oriented to place and time. Recent and remote memory Diminished Attention span, concentration and fund of knowledge diminished. Poor recall 1/3. Animal naming diminished 5 only.. Mood and affect appropriate. Mini-Mental status exam scored 23/30 with deficits in orientation, recall, calculation following three-step commands. Clock drawing 4/4. Cranial Nerves: Fundoscopic exam reveals sharp disc margins. Pupils equal, briskly reactive to light. Extraocular movements full without nystagmus. Visual fields full to confrontation. Hearing intact. Facial sensation intact. Face,  tongue, palate moves normally and symmetrically.  Motor: Normal bulk and tone. Normal strength in all tested extremity muscles. Sensory.: intact to touch , pinprick , position and vibratory sensation.  Coordination: Rapid alternating movements normal in all extremities. Finger-to-nose and heel-to-shin performed accurately bilaterally. Gait and Station: Arises from chair without difficulty. Stance is normal. Gait demonstrates normal stride and mild imbalance. Retropulsion present.. Not able to heel, toe and tandem walk without difficulty.  Reflexes: 1+ and symmetric. Toes downgoing.       ASSESSMENT: 74 year Caucasian lady with history of 3 falls over the last 6 weeks likely multifactorial due to combination of known cerebellopontine angle tumor, vertebrobasilar insufficiencysilent left occipital infarct, cerebrovascular disease and mild dementia.    PLAN: I had a long discussion with the patient and husband regarding her memory loss which likely represents mild early dementia.  I discussed results of the lab testing ,CT angiogram and EEG with the patient. I recommend a trial of Aricept 5 mg daily with food for 4 weeks and increase if tolerated without side effects or 10 mg daily. Continue warfarin for stroke prevention for atrial fibrillation and maintain strict control of hypertension with blood pressure goal below 130/90. Greater than 50% time during this 30 minute visit was spent on counseling and coordination care about memory loss She will return for follow-up in 2 months or call earlier if necessary. Antony Contras, MD  [facility] [address] [place], [place] [address]  Phone [phone] Fax [phone] Note: This document was prepared with digital dictation and possible smart phrase technology. Any transcriptional errors that result from this process are unintentional.

## 2016-02-29 NOTE — Patient Instructions (Signed)
I had a long discussion with the patient and husband regarding her memory loss which likely represents mild early dementia. I discussed results of the lab testing ,CT angiogram and EEG with the patient. I recommend a trial of Aricept 5 mg daily with food for 4 weeks and increase if tolerated without side effects or 10 mg daily. Continue warfarin for stroke prevention for atrial fibrillation and maintain strict control of hypertension with blood pressure goal below 130/90. She will return for follow-up in 2 months or call earlier if necessary  Donepezil tablets What is this medicine? DONEPEZIL (doe NEP e zil) is used to treat mild to moderate dementia caused by Alzheimer's disease. This medicine may be used for other purposes; ask your health care provider or pharmacist if you have questions. What should I tell my health care provider before I take this medicine? They need to know if you have any of these conditions: -asthma or other lung disease -difficulty passing urine -head injury -heart disease -history of irregular heartbeat -liver disease -seizures (convulsions) -stomach or intestinal disease, ulcers or stomach bleeding -an unusual or allergic reaction to donepezil, other medicines, foods, dyes, or preservatives -pregnant or trying to get pregnant -breast-feeding How should I use this medicine? Take this medicine by mouth with a glass of water. Follow the directions on the prescription label. You may take this medicine with or without food. Take this medicine at regular intervals. This medicine is usually taken before bedtime. Do not take it more often than directed. Continue to take your medicine even if you feel better. Do not stop taking except on your doctor's advice. If you are taking the 23 mg donepezil tablet, swallow it whole; do not cut, crush, or chew it. Talk to your pediatrician regarding the use of this medicine in children. Special care may be needed. Overdosage: If you  think you have taken too much of this medicine contact a poison control center or emergency room at once. NOTE: This medicine is only for you. Do not share this medicine with others. What if I miss a dose? If you miss a dose, take it as soon as you can. If it is almost time for your next dose, take only that dose, do not take double or extra doses. What may interact with this medicine? Do not take this medicine with any of the following medications: -certain medicines for fungal infections like itraconazole, fluconazole, posaconazole, and voriconazole -cisapride -dextromethorphan; quinidine -dofetilide -dronedarone -pimozide -quinidine -thioridazine -ziprasidone This medicine may also interact with the following medications: -antihistamines for allergy, cough and cold -atropine -bethanechol -carbamazepine -certain medicines for bladder problems like oxybutynin, tolterodine -certain medicines for Parkinson's disease like benztropine, trihexyphenidyl -certain medicines for stomach problems like dicyclomine, hyoscyamine -certain medicines for travel sickness like scopolamine -dexamethasone -ipratropium -NSAIDs, medicines for pain and inflammation, like ibuprofen or naproxen -other medicines for Alzheimer's disease -other medicines that prolong the QT interval (cause an abnormal heart rhythm) -phenobarbital -phenytoin -rifampin, rifabutin or rifapentine This list may not describe all possible interactions. Give your health care provider a list of all the medicines, herbs, non-prescription drugs, or dietary supplements you use. Also tell them if you smoke, drink alcohol, or use illegal drugs. Some items may interact with your medicine. What should I watch for while using this medicine? Visit your doctor or health care professional for regular checks on your progress. Check with your doctor or health care professional if your symptoms do not get better or if they get worse. You  may get  drowsy or dizzy. Do not drive, use machinery, or do anything that needs mental alertness until you know how this drug affects you. What side effects may I notice from receiving this medicine? Side effects that you should report to your doctor or health care professional as soon as possible: -allergic reactions like skin rash, itching or hives, swelling of the face, lips, or tongue -changes in vision -feeling faint or lightheaded, falls -problems with balance -redness, blistering, peeling or loosening of the skin, including inside the mouth -slow heartbeat, or palpitations -stomach pain -unusual bleeding or bruising, red or purple spots on the skin -vomiting -weight loss Side effects that usually do not require medical attention (report to your doctor or health care professional if they continue or are bothersome): -diarrhea, especially when starting treatment -headache -indigestion or heartburn -loss of appetite -muscle cramps -nausea This list may not describe all possible side effects. Call your doctor for medical advice about side effects. You may report side effects to FDA at 1-800-FDA-1088. Where should I keep my medicine? Keep out of reach of children. Store at room temperature between 15 and 30 degrees C (59 and 86 degrees F). Throw away any unused medicine after the expiration date. NOTE: This sheet is a summary. It may not cover all possible information. If you have questions about this medicine, talk to your doctor, pharmacist, or health care provider.    2016, Elsevier/Gold Standard. (2013-12-02 07:51:52)

## 2016-03-01 DIAGNOSIS — J9621 Acute and chronic respiratory failure with hypoxia: Secondary | ICD-10-CM | POA: Diagnosis not present

## 2016-03-01 DIAGNOSIS — M6281 Muscle weakness (generalized): Secondary | ICD-10-CM | POA: Diagnosis not present

## 2016-03-01 DIAGNOSIS — I272 Pulmonary hypertension, unspecified: Secondary | ICD-10-CM | POA: Diagnosis not present

## 2016-03-01 DIAGNOSIS — I110 Hypertensive heart disease with heart failure: Secondary | ICD-10-CM | POA: Diagnosis not present

## 2016-03-01 DIAGNOSIS — I340 Nonrheumatic mitral (valve) insufficiency: Secondary | ICD-10-CM | POA: Diagnosis not present

## 2016-03-01 DIAGNOSIS — I5033 Acute on chronic diastolic (congestive) heart failure: Secondary | ICD-10-CM | POA: Diagnosis not present

## 2016-03-06 DIAGNOSIS — J9621 Acute and chronic respiratory failure with hypoxia: Secondary | ICD-10-CM | POA: Diagnosis not present

## 2016-03-06 DIAGNOSIS — M6281 Muscle weakness (generalized): Secondary | ICD-10-CM | POA: Diagnosis not present

## 2016-03-06 DIAGNOSIS — I110 Hypertensive heart disease with heart failure: Secondary | ICD-10-CM | POA: Diagnosis not present

## 2016-03-06 DIAGNOSIS — I5033 Acute on chronic diastolic (congestive) heart failure: Secondary | ICD-10-CM | POA: Diagnosis not present

## 2016-03-06 DIAGNOSIS — I272 Pulmonary hypertension, unspecified: Secondary | ICD-10-CM | POA: Diagnosis not present

## 2016-03-06 DIAGNOSIS — I340 Nonrheumatic mitral (valve) insufficiency: Secondary | ICD-10-CM | POA: Diagnosis not present

## 2016-03-08 ENCOUNTER — Telehealth: Payer: Self-pay | Admitting: *Deleted

## 2016-03-08 DIAGNOSIS — M6281 Muscle weakness (generalized): Secondary | ICD-10-CM | POA: Diagnosis not present

## 2016-03-08 DIAGNOSIS — I5033 Acute on chronic diastolic (congestive) heart failure: Secondary | ICD-10-CM | POA: Diagnosis not present

## 2016-03-08 DIAGNOSIS — I340 Nonrheumatic mitral (valve) insufficiency: Secondary | ICD-10-CM | POA: Diagnosis not present

## 2016-03-08 DIAGNOSIS — J9621 Acute and chronic respiratory failure with hypoxia: Secondary | ICD-10-CM | POA: Diagnosis not present

## 2016-03-08 DIAGNOSIS — I110 Hypertensive heart disease with heart failure: Secondary | ICD-10-CM | POA: Diagnosis not present

## 2016-03-08 DIAGNOSIS — I272 Pulmonary hypertension, unspecified: Secondary | ICD-10-CM | POA: Diagnosis not present

## 2016-03-08 NOTE — Telephone Encounter (Signed)
Reduce lisinopril from 20 mg to 10 mg and continue with 20 mg of Lasix which is half of a 40 if that's what she has been doing. Have home health to continue to monitor blood pressures and call us if they continue to run low and we will reduce medicine further

## 2016-03-08 NOTE — Telephone Encounter (Signed)
[facility] CALLED AND AWARE of changes and they will touch base with [place] next week

## 2016-03-12 DIAGNOSIS — I5033 Acute on chronic diastolic (congestive) heart failure: Secondary | ICD-10-CM | POA: Diagnosis not present

## 2016-03-12 DIAGNOSIS — I340 Nonrheumatic mitral (valve) insufficiency: Secondary | ICD-10-CM | POA: Diagnosis not present

## 2016-03-12 DIAGNOSIS — J9621 Acute and chronic respiratory failure with hypoxia: Secondary | ICD-10-CM | POA: Diagnosis not present

## 2016-03-12 DIAGNOSIS — M6281 Muscle weakness (generalized): Secondary | ICD-10-CM | POA: Diagnosis not present

## 2016-03-12 DIAGNOSIS — I110 Hypertensive heart disease with heart failure: Secondary | ICD-10-CM | POA: Diagnosis not present

## 2016-03-12 DIAGNOSIS — I272 Pulmonary hypertension, unspecified: Secondary | ICD-10-CM | POA: Diagnosis not present

## 2016-03-14 DIAGNOSIS — I110 Hypertensive heart disease with heart failure: Secondary | ICD-10-CM | POA: Diagnosis not present

## 2016-03-14 DIAGNOSIS — I340 Nonrheumatic mitral (valve) insufficiency: Secondary | ICD-10-CM | POA: Diagnosis not present

## 2016-03-14 DIAGNOSIS — I272 Pulmonary hypertension, unspecified: Secondary | ICD-10-CM | POA: Diagnosis not present

## 2016-03-14 DIAGNOSIS — I5033 Acute on chronic diastolic (congestive) heart failure: Secondary | ICD-10-CM | POA: Diagnosis not present

## 2016-03-14 DIAGNOSIS — J9621 Acute and chronic respiratory failure with hypoxia: Secondary | ICD-10-CM | POA: Diagnosis not present

## 2016-03-14 DIAGNOSIS — M6281 Muscle weakness (generalized): Secondary | ICD-10-CM | POA: Diagnosis not present

## 2016-03-18 DIAGNOSIS — I088 Other rheumatic multiple valve diseases: Secondary | ICD-10-CM | POA: Diagnosis not present

## 2016-03-18 DIAGNOSIS — I340 Nonrheumatic mitral (valve) insufficiency: Secondary | ICD-10-CM | POA: Diagnosis not present

## 2016-03-18 DIAGNOSIS — R931 Abnormal findings on diagnostic imaging of heart and coronary circulation: Secondary | ICD-10-CM | POA: Diagnosis not present

## 2016-03-18 DIAGNOSIS — I517 Cardiomegaly: Secondary | ICD-10-CM | POA: Diagnosis not present

## 2016-03-18 DIAGNOSIS — Z9889 Other specified postprocedural states: Secondary | ICD-10-CM | POA: Diagnosis not present

## 2016-03-18 DIAGNOSIS — I081 Rheumatic disorders of both mitral and tricuspid valves: Secondary | ICD-10-CM | POA: Diagnosis not present

## 2016-03-18 DIAGNOSIS — I371 Nonrheumatic pulmonary valve insufficiency: Secondary | ICD-10-CM | POA: Diagnosis not present

## 2016-03-19 ENCOUNTER — Telehealth: Payer: Self-pay | Admitting: Cardiology

## 2016-03-19 DIAGNOSIS — J9621 Acute and chronic respiratory failure with hypoxia: Secondary | ICD-10-CM | POA: Diagnosis not present

## 2016-03-19 DIAGNOSIS — M6281 Muscle weakness (generalized): Secondary | ICD-10-CM | POA: Diagnosis not present

## 2016-03-19 DIAGNOSIS — I5033 Acute on chronic diastolic (congestive) heart failure: Secondary | ICD-10-CM | POA: Diagnosis not present

## 2016-03-19 DIAGNOSIS — I272 Pulmonary hypertension, unspecified: Secondary | ICD-10-CM | POA: Diagnosis not present

## 2016-03-19 DIAGNOSIS — I110 Hypertensive heart disease with heart failure: Secondary | ICD-10-CM | POA: Diagnosis not present

## 2016-03-19 DIAGNOSIS — I340 Nonrheumatic mitral (valve) insufficiency: Secondary | ICD-10-CM | POA: Diagnosis not present

## 2016-03-19 NOTE — Telephone Encounter (Signed)
New message      Pt c/o BP issue: STAT if pt c/o blurred vision, one-sided weakness or slurred speech  1. What are your last 5 BP readings? 80/50, HR 61 taken today by nurse  2. Are you having any other symptoms (ex. Dizziness, headache, blurred vision, passed out)?  Dizziness and fatigue. Pt had sob but relieved with oxygen.  Oxygen level is 99% 3. What is your BP issue? Calling to report low bp today

## 2016-03-19 NOTE — Telephone Encounter (Signed)
Spoke to [place] regarding patient's recent BP reading. She notes pt pressure came up to 100/50 after drinking a glass of water. States frequent hypotensive episodes, "not daily" but more than once a week, have been a recurrent issue. Benjamine Mola has been working with patient and encouraging her to drink water. Notes that patient is consistently symptomatic when she has BP drops with c/o fatigue, dizziness, SOB.  She notes that recently pt's lisinopril was reduced from 40mg  daily to 20mg  daily. Our current med list is accurate per review w HHRN.  Aware I will route for advice.

## 2016-03-19 NOTE — Telephone Encounter (Signed)
Relayed advice to [facility] who acknowledged recommendations and verbalized thanks.

## 2016-03-19 NOTE — Telephone Encounter (Signed)
Would recommend to stop the amlodipine 2.5 mg for now.  Continue to monitor home BP readings.

## 2016-03-20 ENCOUNTER — Telehealth: Payer: Self-pay | Admitting: Cardiology

## 2016-03-20 DIAGNOSIS — I272 Pulmonary hypertension, unspecified: Secondary | ICD-10-CM | POA: Diagnosis not present

## 2016-03-20 DIAGNOSIS — M6281 Muscle weakness (generalized): Secondary | ICD-10-CM | POA: Diagnosis not present

## 2016-03-20 DIAGNOSIS — J9621 Acute and chronic respiratory failure with hypoxia: Secondary | ICD-10-CM | POA: Diagnosis not present

## 2016-03-20 DIAGNOSIS — I110 Hypertensive heart disease with heart failure: Secondary | ICD-10-CM | POA: Diagnosis not present

## 2016-03-20 DIAGNOSIS — I5033 Acute on chronic diastolic (congestive) heart failure: Secondary | ICD-10-CM | POA: Diagnosis not present

## 2016-03-20 DIAGNOSIS — I340 Nonrheumatic mitral (valve) insufficiency: Secondary | ICD-10-CM | POA: Diagnosis not present

## 2016-03-20 NOTE — Telephone Encounter (Signed)
Doctor called and asked for Dr Warren Lacy.I told him he was unavailable this week. She said she would like to talk to Dr Jillyn Ledger nurse.

## 2016-03-20 NOTE — Telephone Encounter (Signed)
Mickel Baas is not a doctor,she is a PA. Please call,when you have a break or if you want triage to handle it,forward it to one of them please.

## 2016-03-20 NOTE — Telephone Encounter (Signed)
Spoke with Mickel Baas, [place], pt had an Echo done and she will be faxing it over for Dr Percival Spanish to review, she also want Dr Percival Spanish to f/u with pt before schedule time which is 3 months, since patient haven't been feeling well, told her Dr Percival Spanish is on vacation and will be out of office until after thanksgiving, she is ok with that, she just want Dr Percival Spanish to review echo

## 2016-03-21 ENCOUNTER — Other Ambulatory Visit: Payer: Self-pay | Admitting: Family Medicine

## 2016-03-21 DIAGNOSIS — M6281 Muscle weakness (generalized): Secondary | ICD-10-CM | POA: Diagnosis not present

## 2016-03-21 DIAGNOSIS — I5033 Acute on chronic diastolic (congestive) heart failure: Secondary | ICD-10-CM | POA: Diagnosis not present

## 2016-03-21 DIAGNOSIS — J9621 Acute and chronic respiratory failure with hypoxia: Secondary | ICD-10-CM | POA: Diagnosis not present

## 2016-03-21 DIAGNOSIS — I110 Hypertensive heart disease with heart failure: Secondary | ICD-10-CM | POA: Diagnosis not present

## 2016-03-21 DIAGNOSIS — I340 Nonrheumatic mitral (valve) insufficiency: Secondary | ICD-10-CM | POA: Diagnosis not present

## 2016-03-21 DIAGNOSIS — I272 Pulmonary hypertension, unspecified: Secondary | ICD-10-CM | POA: Diagnosis not present

## 2016-03-22 ENCOUNTER — Ambulatory Visit (INDEPENDENT_AMBULATORY_CARE_PROVIDER_SITE_OTHER): Payer: Medicare Other | Admitting: Pharmacist

## 2016-03-22 ENCOUNTER — Telehealth: Payer: Self-pay | Admitting: *Deleted

## 2016-03-22 ENCOUNTER — Ambulatory Visit: Payer: Medicare Other | Admitting: Family Medicine

## 2016-03-22 VITALS — BP 104/42 | HR 64

## 2016-03-22 DIAGNOSIS — I480 Paroxysmal atrial fibrillation: Secondary | ICD-10-CM | POA: Diagnosis not present

## 2016-03-22 DIAGNOSIS — Z23 Encounter for immunization: Secondary | ICD-10-CM

## 2016-03-22 DIAGNOSIS — I4819 Other persistent atrial fibrillation: Secondary | ICD-10-CM

## 2016-03-22 DIAGNOSIS — I481 Persistent atrial fibrillation: Secondary | ICD-10-CM

## 2016-03-22 DIAGNOSIS — I952 Hypotension due to drugs: Secondary | ICD-10-CM | POA: Diagnosis not present

## 2016-03-22 DIAGNOSIS — Z79899 Other long term (current) drug therapy: Secondary | ICD-10-CM | POA: Diagnosis not present

## 2016-03-22 LAB — COAGUCHEK XS/INR WAIVED
INR: 2.7 — AB (ref 0.9–1.1)
PROTHROMBIN TIME: 32.2 s

## 2016-03-22 MED ORDER — DULOXETINE HCL 30 MG PO CPEP: ORAL_CAPSULE | ORAL | 0 refills | Status: DC

## 2016-03-22 NOTE — Patient Instructions (Signed)
Increase Cymbalta / duloxetine back to 60mg  daily (can take 2 capsules of 30mg ) to see if mood improves.   Stop lisinopril until you see Dr Laurance Flatten on 04/03/16 - will recheck blood pressure and heart rate at that appointment.

## 2016-03-22 NOTE — Telephone Encounter (Signed)
Home health nurse called again about her BP, it was 94/40 yesterday morning, after moving around and drinking water it was 120/46. Sent this to you because you already know what is going on

## 2016-03-22 NOTE — Progress Notes (Signed)
Patient ID: Patricia Villa, female   DOB: 1932/12/31, 82 y.o.   MRN: [id number]   Subjective:     Indication: atrial fibrillation and mild mitral valve prolapse / insufficiency Bleeding signs/symptoms: None Thromboembolic signs/symptoms: None  Missed Coumadin doses: None Medication changes: yes - amlodipine 2.5mg  stopped 03/19/16 due to low BP Dietary changes: no Bacterial/viral infection: no Other concerns: yes - Patient's care giver reports (and patient mentions) 2 episodes of crying over the last week. Her cymbalta was decreased from 60mg  to 30mg  11/25/2015.  She was suppose to follow up with PCP today but due to emergency she that appt was rescheduled to 04/02/2016.   Objective:    INR Today: 2.7 Current dose: warfarin 7.5mg  on Wednesdays, take 5mg  all other days.  Vitals:   03/22/16 1121  BP: (!) 104/42  Pulse: 64    Assessment:    Therapeutic INR for goal of 2-3   Depression - worsened since change in cymbalta dose Low BP  Plan:    1. New dose: no change in warfarin dose   2. Next INR: 2 weeks   3.  Recommend increase cymbalta back to 60mg  qd and follow up with PCP as planned 04/03/16 4.  Hold lisinopril but continue metoprolol succinate 25mg  1/2 tablet daily. 5.  Maintain adequate fluid intake  RTC to see PCP 04/02/16

## 2016-03-22 NOTE — Telephone Encounter (Signed)
Noted = pt is here today seeing pharm. d

## 2016-03-27 DIAGNOSIS — I340 Nonrheumatic mitral (valve) insufficiency: Secondary | ICD-10-CM | POA: Diagnosis not present

## 2016-03-27 DIAGNOSIS — J9621 Acute and chronic respiratory failure with hypoxia: Secondary | ICD-10-CM | POA: Diagnosis not present

## 2016-03-27 DIAGNOSIS — M6281 Muscle weakness (generalized): Secondary | ICD-10-CM | POA: Diagnosis not present

## 2016-03-27 DIAGNOSIS — I272 Pulmonary hypertension, unspecified: Secondary | ICD-10-CM | POA: Diagnosis not present

## 2016-03-27 DIAGNOSIS — I5033 Acute on chronic diastolic (congestive) heart failure: Secondary | ICD-10-CM | POA: Diagnosis not present

## 2016-03-27 DIAGNOSIS — I110 Hypertensive heart disease with heart failure: Secondary | ICD-10-CM | POA: Diagnosis not present

## 2016-04-01 ENCOUNTER — Telehealth: Payer: Self-pay | Admitting: Cardiology

## 2016-04-01 NOTE — Telephone Encounter (Signed)
I spoke to patient. She reports she has been coughing a lot. Also c/o nasal drainage and post-nasal drip. Concerned d/t her a fib history, did not want to take meds that may affect her HR. Her rate was 99 today via pulse ox. No BP reading available. She also was concerned for any medication precautions due to her mitral insufficiency/valve issues, notes this has been followed by Dr. Roxy Manns and subsequently by Dr. Bridgette Habermann out of [place]. She apparently had a procedure for this but we do not have the records from Dr. Evie Lacks office -- Dr. Percival Spanish notes in his last OV dictation he spoke w Dr. Bridgette Habermann. This was my first time speaking to patient, as I am aware. She sounds short of breath on phone and I noted & asked patient about this. She states she is chronically O2 dependent, also has vocal cord paralysis which contributes to her speech pattern sounding exerted. She denies recent acute dyspnea or need to up-titrate her home O2.  I have given her recommendations for taking heart-safe cold medications. I have also made her aware that I will seek review of her concerns by provider.  She has a PCP visit tomorrow w Dr. Laurance Flatten and I told her that given her symptoms this would be appropriate follow up.  Will seek review of this for further recommendations.

## 2016-04-01 NOTE — Telephone Encounter (Signed)
New Message  Pt voiced needing to ask nurse a question, pt would not go into details as to what she is calling in reference too.  Please f/u with pt

## 2016-04-02 ENCOUNTER — Ambulatory Visit (INDEPENDENT_AMBULATORY_CARE_PROVIDER_SITE_OTHER): Payer: Medicare Other | Admitting: Family Medicine

## 2016-04-02 ENCOUNTER — Encounter: Payer: Self-pay | Admitting: Family Medicine

## 2016-04-02 ENCOUNTER — Telehealth: Payer: Self-pay | Admitting: Family Medicine

## 2016-04-02 VITALS — BP 95/63 | HR 79 | Temp 98.2°F | Ht 64.0 in | Wt 134.0 lb

## 2016-04-02 DIAGNOSIS — I6930 Unspecified sequelae of cerebral infarction: Secondary | ICD-10-CM | POA: Diagnosis not present

## 2016-04-02 DIAGNOSIS — J209 Acute bronchitis, unspecified: Secondary | ICD-10-CM | POA: Diagnosis not present

## 2016-04-02 DIAGNOSIS — E7800 Pure hypercholesterolemia, unspecified: Secondary | ICD-10-CM | POA: Diagnosis not present

## 2016-04-02 DIAGNOSIS — E559 Vitamin D deficiency, unspecified: Secondary | ICD-10-CM | POA: Diagnosis not present

## 2016-04-02 DIAGNOSIS — I481 Persistent atrial fibrillation: Secondary | ICD-10-CM | POA: Diagnosis not present

## 2016-04-02 DIAGNOSIS — I4819 Other persistent atrial fibrillation: Secondary | ICD-10-CM

## 2016-04-02 DIAGNOSIS — I10 Essential (primary) hypertension: Secondary | ICD-10-CM | POA: Diagnosis not present

## 2016-04-02 LAB — COAGUCHEK XS/INR WAIVED
INR: 2.2 — AB (ref 0.9–1.1)
PROTHROMBIN TIME: 26.7 s

## 2016-04-02 MED ORDER — AZITHROMYCIN 250 MG PO TABS: ORAL_TABLET | ORAL | 0 refills | Status: DC

## 2016-04-02 NOTE — Telephone Encounter (Signed)
She can follow with Dr. Laurance Flatten.  Agree with heart safe cold meds.

## 2016-04-02 NOTE — Patient Instructions (Addendum)
Medicare Annual Wellness Visit  [facility] and the medical providers at [facility] strive to bring you the best medical care.  In doing so we not only want to address your current medical conditions and concerns but also to detect new conditions early and prevent illness, disease and health-related problems.    Medicare offers a yearly Wellness Visit which allows our clinical staff to assess your need for preventative services including immunizations, lifestyle education, counseling to decrease risk of preventable diseases and screening for fall risk and other medical concerns.    This visit is provided free of charge (no copay) for all Medicare recipients. The clinical pharmacists at [facility] have begun to conduct these Wellness Visits which will also include a thorough review of all your medications.    As you primary medical provider recommend that you make an appointment for your Annual Wellness Visit if you have not done so already this year.  You may set up this appointment before you leave today or you may call back [phone] and schedule an appointment.  Please make sure when you call that you mention that you are scheduling your Annual Wellness Visit with the clinical pharmacist so that the appointment may be made for the proper length of time.     Continue current medications. Continue good therapeutic lifestyle changes which include good diet and exercise. Fall precautions discussed with patient. If an FOBT was given today- please return it to our front desk. If you are over 99 years old - you may need Prevnar 40 or the adult Pneumonia vaccine.  **Flu shots are available--- please call and schedule a FLU-CLINIC appointment**  After your visit with [place] today you will receive a survey in the mail or online from [employer] regarding your care with [place]. Please take a moment to fill this out. Your feedback is very  important to [place] as you can help [place] better understand your patient needs as well as improve your experience and satisfaction. WE CARE ABOUT YOU!!!   Take Mucinex maximum strength 1 twice daily for cough and congestion Take antibiotic as directed Use nasal saline frequently during the day and drink plenty of fluids Keep the house as cool as possible

## 2016-04-02 NOTE — Telephone Encounter (Signed)
Called number in EMR which was patient's son's number.  He was not the one who called and he was unsure what his mother's questions was.  I am thinking it was problably related to starting ABX with warfarin.  Recommend recheck INR next week.  Son will call patient and call back is question was regarding something else.

## 2016-04-02 NOTE — Progress Notes (Signed)
Subjective:    Patient ID: Patricia Villa, female    DOB: Oct 04, 1932, 82 y.o.   MRN: [id number]  HPI Pt here for follow up and management of chronic medical problems which includes hyperlipidemia and hypertension. She is taking medications regularly.Patient returns to the office today following mitral clip surgery and [facility] because of severe mitral valve insufficiency. She continues to be followed by the cardiologist locally. She does complain of some congestion and drainage and occasional nosebleeds. She will get lab work today. The last note from the cardiologist locally was that he was going to continue to follow-up with her specimen after getting a copy of the records from her mitral clip surgery in [facility] by Dr. Bridgette Habermann. The patient denies any chest pain or shortness of breath anymore than usual. She denies any trouble with her intestinal tract or voiding issues. She has had this chest congestion for a couple of days and the sputum she is coughing up is yellow in color. She comes to the visit today with her caregiver who seems to be very attentive to the patient.    Patient Active Problem List   Diagnosis Date Noted  . Vocal cord paralysis, unilateral complete   . Vertebrobasilar insufficiency   . Stroke due to embolism of left cerebellar artery ([place])   . Aortic atherosclerosis ([place]) 11/20/2015  . Atrial fibrillation, persistent ([place]) 10/30/2015  . Atrial fibrillation ([place]) 11/23/2014  . Osteoporosis 08/31/2014  . Chronic neck pain 06/07/2014  . DDD (degenerative disc disease), cervical 01/24/2014  . Hyperlipidemia 05/17/2013  . Colitis 01/20/2013  . Generalized anxiety disorder 08/05/2012  . Meningioma ([place]) 08/05/2012  . Bruit 10/17/2010  . Essential hypertension, benign 10/04/2009  . MITRAL INSUFFICIENCY 09/26/2009  . Mitral valve disorder 09/26/2009  . Hypertrophic obstructive cardiomyopathy ([place]) 09/26/2009   Outpatient Encounter Prescriptions as of  04/02/2016  Medication Sig  . Cholecalciferol (VITAMIN D3) 2000 UNITS TABS Take 2,000 Units by mouth daily.  Marland Kitchen donepezil (ARICEPT) 10 MG tablet Take 1 tablet (10 mg total) by mouth at bedtime.  . DULoxetine (CYMBALTA) 30 MG capsule Take 2 capsules (12m) once daily  . folic acid (FOLVITE) 1 MG tablet Take 1 mg by mouth daily.  . furosemide (LASIX) 40 MG tablet 20 mg. Take 1/2 tablet by mouth ( 20 mg ) daily  . metoprolol succinate (TOPROL-XL) 25 MG 24 hr tablet Take 0.5 tablets (12.5 mg total) by mouth daily.  .Marland Kitchenwarfarin (COUMADIN) 5 MG tablet Take 1 tablet (5 mg total) by mouth daily.   No facility-administered encounter medications on file as of 04/02/2016.       Review of Systems  Constitutional: Negative.   HENT: Positive for congestion and postnasal drip.        Occasional nose bleed  Eyes: Negative.   Respiratory: Negative.   Cardiovascular: Negative.   Gastrointestinal: Negative.   Endocrine: Negative.   Genitourinary: Negative.   Musculoskeletal: Negative.   Skin: Negative.   Allergic/Immunologic: Negative.   Neurological: Negative.   Hematological: Negative.   Psychiatric/Behavioral: Negative.        Objective:   Physical Exam  Constitutional: She is oriented to person, place, and time. She appears well-developed and well-nourished. No distress.  Elderly small body frame but alert and status post mitral clip surgery for severe mitral valve insufficiency  HENT:  Head: Normocephalic.  Right Ear: External ear normal.  Left Ear: External ear normal.  Mouth/Throat: Oropharynx is clear and moist.  Nasal irritation left side  with minor bleeding  Eyes: Conjunctivae and EOM are normal. Pupils are equal, round, and reactive to light. Right eye exhibits no discharge. Left eye exhibits no discharge. No scleral icterus.  Neck: Normal range of motion. Neck supple. No thyromegaly present.  Cardiovascular: Normal rate, regular rhythm and normal heart sounds.  Exam reveals no  friction rub.   No murmur heard. The heart today had a regular rate and rhythm at 84/m  Pulmonary/Chest: Effort normal and breath sounds normal. No respiratory distress. She has no wheezes. She has no rales.  No wheezes or rales but increased congestion in the bronchial tree with coughing. The sputum production was yellow.  Abdominal: Soft. Bowel sounds are normal. She exhibits no mass. There is no tenderness. There is no rebound and no guarding.  No abdominal tenderness liver or spleen enlargement or suprapubic tenderness.  Musculoskeletal: Normal range of motion. She exhibits no edema or tenderness.  Lymphadenopathy:    She has no cervical adenopathy.  Neurological: She is alert and oriented to person, place, and time.  Skin: Skin is warm and dry. No rash noted.  Psychiatric: She has a normal mood and affect. Her behavior is normal. Judgment and thought content normal.  Nursing note and vitals reviewed.   BP 95/63 (BP Location: Left Arm)   Pulse 79   Temp 98.2 F (36.8 C) (Oral)   Ht 5' 4"  (1.626 m)   Wt 134 lb (60.8 kg)   LMP 08/01/1982   BMI 23.00 kg/m   Lab work including CBC is pending     Assessment & Plan:  1. Atrial fibrillation, persistent ([place]) -Patient appears to be in normal sinus rhythm today. - CBC with Differential/Platelet  2. Essential hypertension, benign -The blood pressure is at the low end of the range but the patient is not complaining of any lightheadedness or dizziness. - BMP8+EGFR - CBC with Differential/Platelet - Hepatic function panel  3. Pure hypercholesterolemia -The patient will continue with aggressive therapeutic lifestyle changes as she is allergic to statins or intolerant to statins. - CBC with Differential/Platelet - Lipid panel  4. Vitamin D deficiency -Continue with current treatment pending results of lab work - CBC with Differential/Platelet - VITAMIN D 25 Hydroxy (Vit-D Deficiency, Fractures)  5. Bronchitis with  bronchospasm -Take antibiotic as directed, use Mucinex twice daily and use nasal saline frequently during the day in each nostril  Meds ordered this encounter  Medications  . azithromycin (ZITHROMAX) 250 MG tablet    Sig: As directed    Dispense:  6 tablet    Refill:  0   Patient Instructions                       Medicare Annual Wellness Visit  [facility] and the medical providers at [facility] strive to bring you the best medical care.  In doing so we not only want to address your current medical conditions and concerns but also to detect new conditions early and prevent illness, disease and health-related problems.    Medicare offers a yearly Wellness Visit which allows our clinical staff to assess your need for preventative services including immunizations, lifestyle education, counseling to decrease risk of preventable diseases and screening for fall risk and other medical concerns.    This visit is provided free of charge (no copay) for all Medicare recipients. The clinical pharmacists at [facility] have begun to conduct these Wellness Visits which will also include a thorough  review of all your medications.    As you primary medical provider recommend that you make an appointment for your Annual Wellness Visit if you have not done so already this year.  You may set up this appointment before you leave today or you may call back ([phone]) and schedule an appointment.  Please make sure when you call that you mention that you are scheduling your Annual Wellness Visit with the clinical pharmacist so that the appointment may be made for the proper length of time.     Continue current medications. Continue good therapeutic lifestyle changes which include good diet and exercise. Fall precautions discussed with patient. If an FOBT was given today- please return it to our front desk. If you are over 56 years old - you may need Prevnar 36 or  the adult Pneumonia vaccine.  **Flu shots are available--- please call and schedule a FLU-CLINIC appointment**  After your visit with [place] today you will receive a survey in the mail or online from [employer] regarding your care with [place]. Please take a moment to fill this out. Your feedback is very important to [place] as you can help [place] better understand your patient needs as well as improve your experience and satisfaction. WE CARE ABOUT YOU!!!     Arrie Senate MD

## 2016-04-03 ENCOUNTER — Ambulatory Visit: Payer: Medicare Other | Admitting: Cardiology

## 2016-04-03 LAB — BMP8+EGFR
BUN/Creatinine Ratio: 18 (ref 12–28)
BUN: 16 mg/dL (ref 8–27)
CALCIUM: 9.4 mg/dL (ref 8.7–10.3)
CO2: 23 mmol/L (ref 18–29)
CREATININE: 0.88 mg/dL (ref 0.57–1.00)
Chloride: 94 mmol/L — ABNORMAL LOW (ref 96–106)
GFR calc Af Amer: 71 mL/min/{1.73_m2} (ref >59)
GFR, EST NON AFRICAN AMERICAN: 62 mL/min/{1.73_m2} (ref >59)
Glucose: 90 mg/dL (ref 65–99)
POTASSIUM: 4 mmol/L (ref 3.5–5.2)
Sodium: 134 mmol/L (ref 134–144)

## 2016-04-03 LAB — VITAMIN D 25 HYDROXY (VIT D DEFICIENCY, FRACTURES): VIT D 25 HYDROXY: 49.4 ng/mL (ref 30.0–100.0)

## 2016-04-03 LAB — LIPID PANEL
CHOL/HDL RATIO: 3.1 ratio (ref 0.0–4.4)
Cholesterol, Total: 156 mg/dL (ref 100–199)
HDL: 51 mg/dL (ref >39)
LDL CALC: 87 mg/dL (ref 0–99)
Triglycerides: 88 mg/dL (ref 0–149)
VLDL CHOLESTEROL CAL: 18 mg/dL (ref 5–40)

## 2016-04-03 LAB — HEPATIC FUNCTION PANEL
ALBUMIN: 4.2 g/dL (ref 3.5–4.7)
ALT: 26 IU/L (ref 0–32)
AST: 31 IU/L (ref 0–40)
Alkaline Phosphatase: 76 IU/L (ref 39–117)
BILIRUBIN TOTAL: 1.5 mg/dL — AB (ref 0.0–1.2)
BILIRUBIN, DIRECT: 0.4 mg/dL (ref 0.00–0.40)
TOTAL PROTEIN: 7.6 g/dL (ref 6.0–8.5)

## 2016-04-03 LAB — CBC WITH DIFFERENTIAL/PLATELET
BASOS: 1 %
Basophils Absolute: 0.1 10*3/uL (ref 0.0–0.2)
EOS (ABSOLUTE): 0.1 10*3/uL (ref 0.0–0.4)
Eos: 1 %
Hematocrit: 35.5 % (ref 34.0–46.6)
Hemoglobin: 11.1 g/dL (ref 11.1–15.9)
IMMATURE GRANS (ABS): 0 10*3/uL (ref 0.0–0.1)
IMMATURE GRANULOCYTES: 0 %
LYMPHS: 24 %
Lymphocytes Absolute: 1.9 10*3/uL (ref 0.7–3.1)
MCH: 28.9 pg (ref 26.6–33.0)
MCHC: 31.3 g/dL — ABNORMAL LOW (ref 31.5–35.7)
MCV: 92 fL (ref 79–97)
Monocytes Absolute: 1.1 10*3/uL — ABNORMAL HIGH (ref 0.1–0.9)
Monocytes: 14 %
NEUTROS PCT: 60 %
Neutrophils Absolute: 4.8 10*3/uL (ref 1.4–7.0)
PLATELETS: 264 10*3/uL (ref 150–379)
RBC: 3.84 x10E6/uL (ref 3.77–5.28)
RDW: 16 % — ABNORMAL HIGH (ref 12.3–15.4)
WBC: 8 10*3/uL (ref 3.4–10.8)

## 2016-04-04 ENCOUNTER — Telehealth: Payer: Self-pay | Admitting: Family Medicine

## 2016-04-04 ENCOUNTER — Telehealth: Payer: Self-pay | Admitting: Pharmacist

## 2016-04-04 ENCOUNTER — Ambulatory Visit (INDEPENDENT_AMBULATORY_CARE_PROVIDER_SITE_OTHER): Payer: Medicare Other | Admitting: Family Medicine

## 2016-04-04 DIAGNOSIS — J9621 Acute and chronic respiratory failure with hypoxia: Secondary | ICD-10-CM | POA: Diagnosis not present

## 2016-04-04 DIAGNOSIS — I340 Nonrheumatic mitral (valve) insufficiency: Secondary | ICD-10-CM

## 2016-04-04 DIAGNOSIS — M6281 Muscle weakness (generalized): Secondary | ICD-10-CM | POA: Diagnosis not present

## 2016-04-04 DIAGNOSIS — I5033 Acute on chronic diastolic (congestive) heart failure: Secondary | ICD-10-CM | POA: Diagnosis not present

## 2016-04-04 DIAGNOSIS — I872 Venous insufficiency (chronic) (peripheral): Secondary | ICD-10-CM

## 2016-04-04 DIAGNOSIS — I272 Pulmonary hypertension, unspecified: Secondary | ICD-10-CM

## 2016-04-04 DIAGNOSIS — I110 Hypertensive heart disease with heart failure: Secondary | ICD-10-CM

## 2016-04-04 NOTE — Telephone Encounter (Signed)
Tried to call caregiver (Sandy's) number but no ans - LM on VM.  Also tried to call what I believe is patient's home number [phone].  No ans but left message on ans machine to call back to office.

## 2016-04-04 NOTE — Telephone Encounter (Signed)
Spoke with patient's son.  Plan is to recheck INR Friday, December 8th.

## 2016-04-05 ENCOUNTER — Ambulatory Visit (INDEPENDENT_AMBULATORY_CARE_PROVIDER_SITE_OTHER): Payer: Medicare Other | Admitting: Pharmacist

## 2016-04-05 DIAGNOSIS — I481 Persistent atrial fibrillation: Secondary | ICD-10-CM | POA: Diagnosis not present

## 2016-04-05 DIAGNOSIS — I4819 Other persistent atrial fibrillation: Secondary | ICD-10-CM

## 2016-04-05 DIAGNOSIS — I272 Pulmonary hypertension, unspecified: Secondary | ICD-10-CM | POA: Diagnosis not present

## 2016-04-05 DIAGNOSIS — I480 Paroxysmal atrial fibrillation: Secondary | ICD-10-CM | POA: Diagnosis not present

## 2016-04-05 DIAGNOSIS — I5033 Acute on chronic diastolic (congestive) heart failure: Secondary | ICD-10-CM | POA: Diagnosis not present

## 2016-04-05 DIAGNOSIS — I110 Hypertensive heart disease with heart failure: Secondary | ICD-10-CM | POA: Diagnosis not present

## 2016-04-05 DIAGNOSIS — I6930 Unspecified sequelae of cerebral infarction: Secondary | ICD-10-CM | POA: Diagnosis not present

## 2016-04-05 DIAGNOSIS — M6281 Muscle weakness (generalized): Secondary | ICD-10-CM | POA: Diagnosis not present

## 2016-04-05 DIAGNOSIS — J9621 Acute and chronic respiratory failure with hypoxia: Secondary | ICD-10-CM | POA: Diagnosis not present

## 2016-04-05 DIAGNOSIS — I340 Nonrheumatic mitral (valve) insufficiency: Secondary | ICD-10-CM | POA: Diagnosis not present

## 2016-04-05 LAB — COAGUCHEK XS/INR WAIVED
INR: 3.1 — ABNORMAL HIGH (ref 0.9–1.1)
PROTHROMBIN TIME: 37.5 s

## 2016-04-05 NOTE — Patient Instructions (Signed)
Anticoagulation Dose Instructions as of 04/05/2016      Patricia Villa Tue Wed Thu Fri Sat   New Dose 5 mg 5 mg 5 mg 7.5 mg 5 mg 5 mg 5 mg    Description   Skip dose today and take 0.5 tablet on Saturday and Sunday. Continue usual warfarin dose of 1 and 1/2 tablets on Wednesdays and 1 tablet all other days.  INR today: 3.1 Goal: 2-3

## 2016-04-12 ENCOUNTER — Ambulatory Visit (INDEPENDENT_AMBULATORY_CARE_PROVIDER_SITE_OTHER): Payer: Medicare Other | Admitting: Pharmacist Clinician (PhC)/ Clinical Pharmacy Specialist

## 2016-04-12 DIAGNOSIS — I481 Persistent atrial fibrillation: Secondary | ICD-10-CM

## 2016-04-12 DIAGNOSIS — I480 Paroxysmal atrial fibrillation: Secondary | ICD-10-CM | POA: Diagnosis not present

## 2016-04-12 DIAGNOSIS — I4819 Other persistent atrial fibrillation: Secondary | ICD-10-CM

## 2016-04-12 LAB — COAGUCHEK XS/INR WAIVED
INR: 1.5 — AB (ref 0.9–1.1)
PROTHROMBIN TIME: 17.6 s

## 2016-04-12 NOTE — Patient Instructions (Signed)
Anticoagulation Dose Instructions as of 04/12/2016      Dorene Grebe Tue Wed Thu Fri Sat   New Dose 5 mg 5 mg 5 mg 7.5 mg 5 mg 5 mg 5 mg    Description   Take 1 1/2 tablets today and tomorrow then resume on Sunday your regular schedule.   INR today:  1.5  Goal is 2-3 it is too thick today

## 2016-04-15 DIAGNOSIS — J9621 Acute and chronic respiratory failure with hypoxia: Secondary | ICD-10-CM | POA: Diagnosis not present

## 2016-04-15 DIAGNOSIS — I110 Hypertensive heart disease with heart failure: Secondary | ICD-10-CM | POA: Diagnosis not present

## 2016-04-15 DIAGNOSIS — M6281 Muscle weakness (generalized): Secondary | ICD-10-CM | POA: Diagnosis not present

## 2016-04-15 DIAGNOSIS — I340 Nonrheumatic mitral (valve) insufficiency: Secondary | ICD-10-CM | POA: Diagnosis not present

## 2016-04-15 DIAGNOSIS — I5033 Acute on chronic diastolic (congestive) heart failure: Secondary | ICD-10-CM | POA: Diagnosis not present

## 2016-04-15 DIAGNOSIS — I272 Pulmonary hypertension, unspecified: Secondary | ICD-10-CM | POA: Diagnosis not present

## 2016-04-23 ENCOUNTER — Ambulatory Visit (INDEPENDENT_AMBULATORY_CARE_PROVIDER_SITE_OTHER): Payer: Medicare Other | Admitting: Pharmacist

## 2016-04-23 ENCOUNTER — Encounter: Payer: Self-pay | Admitting: Pharmacist

## 2016-04-23 VITALS — BP 102/62 | HR 60 | Ht 64.0 in | Wt 132.5 lb

## 2016-04-23 DIAGNOSIS — Z0000 Encounter for general adult medical examination without abnormal findings: Secondary | ICD-10-CM | POA: Diagnosis not present

## 2016-04-23 DIAGNOSIS — I480 Paroxysmal atrial fibrillation: Secondary | ICD-10-CM

## 2016-04-23 DIAGNOSIS — I4819 Other persistent atrial fibrillation: Secondary | ICD-10-CM

## 2016-04-23 LAB — COAGUCHEK XS/INR WAIVED
INR: 2.3 — ABNORMAL HIGH (ref 0.9–1.1)
PROTHROMBIN TIME: 27.9 s

## 2016-04-23 NOTE — Progress Notes (Signed)
Patient ID: HUE COCKERILL, female   DOB: [birth year], 82 y.o.   MRN: [id number]     Subjective:   Patricia Villa is a 82 y.o. female who presents for a subsequent Medicare Annual Wellness Visit and to recheck INR   Patricia Villa is married and husband is present with her today.  She is also accompanied by her cargiver [place] with neither her husband to [place] remain in room during interview.  She is retired.  Over the last year she has had a lot of health related complications and is seeing cardiologist and neurologist on a regular basis mitral insufficiency, atrial fibrillation, vertebrobasilar insufficiency, tremor and dementia (mild)  Insomnia - patient has had increased insomnia over the last month. Patient believes that sleep decrease shortly after starting donepzil. She tried melatonin and it seemed to help the first night but not th second. She is expecting to get a new mattress soon and believed this might help  Current Medications (verified) Outpatient Encounter Prescriptions as of 04/23/2016  Medication Sig  . Cholecalciferol (VITAMIN D3) 2000 UNITS TABS Take 2,000 Units by mouth daily.  Marland Kitchen donepezil (ARICEPT) 10 MG tablet Take 1 tablet (10 mg total) by mouth at bedtime.  . DULoxetine (CYMBALTA) 30 MG capsule Take 2 capsules (60mg ) once daily  . folic acid (FOLVITE) 1 MG tablet Take 1 mg by mouth daily.  . furosemide (LASIX) 40 MG tablet Take 20 mg by mouth daily. Take 1/2 tablet by mouth ( 20 mg ) daily  . metoprolol succinate (TOPROL-XL) 25 MG 24 hr tablet Take 0.5 tablets (12.5 mg total) by mouth daily.  Marland Kitchen warfarin (COUMADIN) 5 MG tablet Take 1 tablet (5 mg total) by mouth daily.  . [DISCONTINUED] azithromycin (ZITHROMAX) 250 MG tablet As directed (Patient not taking: Reported on 04/23/2016)   No facility-administered encounter medications on file as of 04/23/2016.     Allergies (verified) Diphenhydramine hcl; Livalo [pitavastatin calcium]; Sulfa antibiotics; Zocor  [simvastatin]; Actonel [risedronate sodium]; Fosamax [alendronate sodium]; and Pravastatin   History: Past Medical History:  Diagnosis Date  . Allergy   . Alopecia   . Anxiety   . Arthritis   . Atrial fibrillation, persistent ([place]) 10/30/2015  . Cataract   . DDD (degenerative disc disease), cervical   . Diverticulosis of colon   . Esotropia   . History of double vision   . Hyperlipidemia   . Hypertension   . Hypertrophic cardiomyopathy ([place])    by history but not identified on the most recent echo  . Meningioma (HCC)    cerebellopontine angle   . Mild mitral valve prolapse   . Mitral insufficiency    Severe with anterior flail leaflet.    . Osteoporosis   . Postmenopausal HRT (hormone replacement therapy)   . 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 Vertebrobasilar insufficiency   . Vocal cord paralysis, unilateral complete    Past Surgical History:  Procedure Laterality Date  . ABDOMINAL HYSTERECTOMY    . CARDIAC CATHETERIZATION N/A 01/02/2016   Procedure: Right/Left Heart Cath and Coronary Angiography;  Surgeon: Sherren Mocha, MD;  Location: [facility];  Service: Cardiovascular;  Laterality: N/A;  . CATARACT EXTRACTION, BILATERAL    . EYE SURGERY    . TEE WITHOUT CARDIOVERSION N/A 12/12/2015   Procedure: TRANSESOPHAGEAL ECHOCARDIOGRAM (TEE);  Surgeon: Lelon Perla, MD;  Location: [facility];  Service: Cardiovascular;  Laterality: N/A;  . TONSILLECTOMY    .  VEIN SURGERY     Family History  Problem Relation Age of Onset  . Other Brother     IHSS  . COPD Brother   . Diabetes Brother   . Heart disease Brother   . Hyperlipidemia Brother   . Hypertension Brother   . Asthma Mother   . Other Sister     IHSS  . Other Sister     IHSS  . Cancer Cousin     thyroid   Social History   Occupational History  . Not on file.   Social History Main Topics  . Smoking status: Never Smoker  . Smokeless tobacco: Never  Used     Comment: Rare previously  . Alcohol use No  . Drug use: No  . Sexual activity: No    Do you feel safe at home?  Yes Are there smokers in your home (other than you)? No  Dietary issues and exercise activities: Current Exercise Habits: The patient does not participate in regular exercise at present, Exercise limited by: cardiac condition(s);orthopedic condition(s);neurologic condition(s);respiratory conditions(s)  Current Dietary habits:  Reports having a good appetite. Meals are prepared by her caregiver.     Objective:    Today's Vitals   04/23/16 1145  BP: 102/62  Pulse: 60  Weight: 132 lb 8 oz (60.1 kg)  Height: 5\' 4"  (1.626 m)  PainSc: 2   PainLoc: Neck   Body mass index is 22.74 kg/m.  INR = 2.3 today   Activities of Daily Living In your present state of health, do you have any difficulty performing the following activities: 04/23/2016 01/02/2016  Hearing? N N  Vision? N N  Difficulty concentrating or making decisions? Y N  Walking or climbing stairs? Y N  Dressing or bathing? N N  Doing errands, shopping? N -  Preparing Food and eating ? Y -  Using the Toilet? N -  In the past six months, have you accidently leaked urine? N -  Do you have problems with loss of bowel control? N -  Managing your Medications? Y -  Managing your Finances? N -  Housekeeping or managing your Housekeeping? Y -  Some recent data might be hidden     Cardiac Risk Factors include: advanced age (>72men, >21 women);family history of premature cardiovascular disease;sedentary lifestyle  Depression Screen PHQ 2/9 Scores 04/23/2016 04/02/2016 11/20/2015 08/28/2015  PHQ - 2 Score 1 0 1 0     Fall Risk Fall Risk  04/23/2016 04/02/2016 02/29/2016 12/20/2015 11/20/2015  Falls in the past year? Yes Yes No Yes Yes  Number falls in past yr: 1 1 - 2 or more 2 or more  Injury with Fall? No No - No Yes  Follow up Falls prevention discussed - - - -    Cognitive Function: MMSE - Mini  Mental State Exam 04/23/2016 02/29/2016 10/27/2014  Orientation to time 5 5 5   Orientation to Place 5 5 5   Registration 3 3 3   Attention/ Calculation 3 1 3   Recall 2 1 3   Language- name 2 objects 2 2 2   Language- repeat 1 1 1   Language- follow 3 step command 3 3 3   Language- read & follow direction 1 1 1   Write a sentence 1 1 1   Copy design 0 0 1  Total score 26 23 28     Immunizations and Health Maintenance Immunization History  Administered Date(s) Administered  . Influenza, High Dose Seasonal PF 03/22/2016  . Influenza,inj,Quad PF,36+ Mos 02/04/2013, 03/10/2014, 02/28/2015  .  Influenza-Unspecified 03/19/2012  . Pneumococcal Conjugate-13 04/05/2013  . Pneumococcal Polysaccharide-23 04/05/2005  . Tdap 08/05/2010  . Zoster 05/06/2006   There are no preventive care reminders to display for this patient.  Patient Care Team: Chipper Herb, MD as PCP - General (Family Medicine) Minus Breeding, MD (Cardiology) Richmond Campbell, MD (Gastroenterology) Viona Gilmore Evette Cristal, MD (Obstetrics and Gynecology) Marilynne Halsted, MD as Referring Physician (Ophthalmology) Garvin Fila, MD as Consulting Physician (Neurology)  Indicate any recent Medical Services you may have received from other than [facility] providers in the past year (date may be approximate).    Assessment:    Annual Wellness Visit  Therapeutic anticoagulation   Screening Tests Health Maintenance  Topic Date Due  . MAMMOGRAM  10/31/2016 (Originally 09/20/2016)  . DEXA SCAN  08/30/2016  . TETANUS/TDAP  08/04/2020  . INFLUENZA VACCINE  Completed  . ZOSTAVAX  Completed  . PNA vac Low Risk Adult  Completed        Plan:   During the course of the visit Shavawn was educated and counseled about the following appropriate screening and preventive services:   Vaccines to include Pneumoccal, Influenza, Td, Zostavax  - vaccines are UTD  Colorectal cancer screening - last colonoscopy was 2014  Cardiovascular disease screening  - UTD  BP is at goal today  Lipids are at goal  Diabetes screening - last FBG was 90  Bone Denisty / Osteoporosis Screening - UTD  Mammogram - done by Dr Nori Riis - UTD; need to request records.  Glaucoma screening / Eye Exam - due now, pt is aware and plans to make appt in 2018  Nutrition counseling - continue current diet  Advanced Directives - copies requested  Physical Activity - encouraged her to consider cardio rehab.  Patient to discuss decreasing dose of donepzil with Dr Leonie Man to see if helps with sleep  Fall prevention discussed  RTC in 1 month to see PCP and 2 months to see me  Anticoagulation Dose Instructions as of 04/23/2016      Dorene Grebe Tue Wed Thu Fri Sat   New Dose 5 mg 5 mg 5 mg 7.5 mg 5 mg 5 mg 5 mg    Description   Continue current warfarin dose of 5mg  - 1 tablet daily except take 1 and 1/2 tablets on Wednesdays  INR today:  2.3  Goal is 2.0 to 3.0        Patient Instructions (the written plan) were given to the patient.   Cherre Robins, PharmD   04/23/2016

## 2016-04-23 NOTE — Patient Instructions (Addendum)
Patricia Villa , Thank you for taking time to come for your Medicare Wellness Visit. I appreciate your ongoing commitment to your health goals. Please review the following plan we discussed and let me know if I can assist you in the future.   These are the goals we discussed: Recommend consider cardiac rehab - this will allow you to increase physical activity with someone closely supervising you.   Discuss donepzil with Dr Leonie Man - donepzil can affect sleep and cause bloating though it is rare.   Look for copy of [place] (important to know where these are kept - you can also bring copy to our office to be placed in our file / electronic chart)   This is a list of the screening recommended for you and due dates:  Health Maintenance  Topic Date Due  . Mammogram  10/31/2016 - last done 09/21/15  . DEXA scan (bone density measurement)  08/30/2016  . Tetanus Vaccine  08/04/2020  . Flu Shot  Completed  . Shingles Vaccine  Completed  . Pneumonia vaccines  Completed  *Topic was postponed. The date shown is not the original due date.   Fall Prevention in the Home Falls can cause injuries and can affect people from all age groups. There are many simple things that you can do to make your home safe and to help prevent falls. What can I do on the outside of my home?  Regularly repair the edges of walkways and driveways and fix any cracks.  Remove high doorway thresholds.  Trim any shrubbery on the main path into your home.  Use bright outdoor lighting.  Clear walkways of debris and clutter, including tools and rocks.  Regularly check that handrails are securely fastened and in good repair. Both sides of any steps should have handrails.  Install guardrails along the edges of any raised decks or porches.  Have leaves, snow, and ice cleared regularly.  Use sand or salt on walkways during winter months.  In the garage, clean up any  spills right away, including grease or oil spills. What can I do in the bathroom?  Use night lights.  Install grab bars by the toilet and in the tub and shower. Do not use towel bars as grab bars.  Use non-skid mats or decals on the floor of the tub or shower.  If you need to sit down while you are in the shower, use a plastic, non-slip stool.  Keep the floor dry. Immediately clean up any water that spills on the floor.  Remove soap buildup in the tub or shower on a regular basis.  Attach bath mats securely with double-sided non-slip rug tape.  Remove throw rugs and other tripping hazards from the floor. What can I do in the bedroom?  Use night lights.  Make sure that a bedside light is easy to reach.  Do not use oversized bedding that drapes onto the floor.  Have a firm chair that has side arms to use for getting dressed.  Remove throw rugs and other tripping hazards from the floor. What can I do in the kitchen?  Clean up any spills right away.  Avoid walking on wet floors.  Place frequently used items in easy-to-reach places.  If you need to reach for something above you, use a sturdy step stool that has a grab bar.  Keep electrical cables out of the way.  Do not use floor polish or wax  that makes floors slippery. If you have to use wax, make sure that it is non-skid floor wax.  Remove throw rugs and other tripping hazards from the floor. What can I do in the stairways?  Do not leave any items on the stairs.  Make sure that there are handrails on both sides of the stairs. Fix handrails that are broken or loose. Make sure that handrails are as long as the stairways.  Check any carpeting to make sure that it is firmly attached to the stairs. Fix any carpet that is loose or worn.  Avoid having throw rugs at the top or bottom of stairways, or secure the rugs with carpet tape to prevent them from moving.  Make sure that you have a light switch at the top of the  stairs and the bottom of the stairs. If you do not have them, have them installed. What are some other fall prevention tips?  Wear closed-toe shoes that fit well and support your feet. Wear shoes that have rubber soles or low heels.  When you use a stepladder, make sure that it is completely opened and that the sides are firmly locked. Have someone hold the ladder while you are using it. Do not climb a closed stepladder.  Add color or contrast paint or tape to grab bars and handrails in your home. Place contrasting color strips on the first and last steps.  Use mobility aids as needed, such as canes, walkers, scooters, and crutches.  Turn on lights if it is dark. Replace any light bulbs that burn out.  Set up furniture so that there are clear paths. Keep the furniture in the same spot.  Fix any uneven floor surfaces.  Choose a carpet design that does not hide the edge of steps of a stairway.  Be aware of any and all pets.  Review your medicines with your healthcare provider. Some medicines can cause dizziness or changes in blood pressure, which increase your risk of falling. Talk with your health care provider about other ways that you can decrease your risk of falls. This may include working with a physical therapist or trainer to improve your strength, balance, and endurance. This information is not intended to replace advice given to you by your health care provider. Make sure you discuss any questions you have with your health care provider. Document Released: 04/12/2002 Document Revised: 09/19/2015 Document Reviewed: 05/27/2014 Elsevier Interactive Patient Education  2017 [employer].

## 2016-05-09 ENCOUNTER — Ambulatory Visit (INDEPENDENT_AMBULATORY_CARE_PROVIDER_SITE_OTHER): Payer: Medicare Other | Admitting: Neurology

## 2016-05-09 ENCOUNTER — Encounter: Payer: Self-pay | Admitting: Neurology

## 2016-05-09 VITALS — BP 123/75 | HR 76 | Ht 64.0 in | Wt 137.4 lb

## 2016-05-09 DIAGNOSIS — R42 Dizziness and giddiness: Secondary | ICD-10-CM | POA: Diagnosis not present

## 2016-05-09 NOTE — Progress Notes (Signed)
[facility] [address] [place] [address] [phone]       OFFICE  FOLLOW UP VISIT  NOTE  Patricia Villa Date of Birth:  Aug 13, 1932 Medical Record Number:  [id number]   Referring MD:   Redge Gainer Reason for Referral:   falls  IRJ:JOACZYS Consult 12/20/15 :  Patricia Villa is a 79 year pleasant Caucasian lady who is accompanied by her son today. She has had 3 falls over the last 6 weeks. She states that the fall mostly occur when she is getting up and looking up or leaning backwards. All the fall she is fallen backwards. She has never lost consciousness. She feels off balance. She denies nausea, vomiting vertigo loss of vision or blurred vision. She had is MRI scan of the brain on 5/25 for 17 which are personally reviewed shows left cerebellar pontine angle meningioma which is stable compared with prior scans and there is a small enhancing area of the left occipital cortex which may represent possible subacute infarct. The patient complains of long-standing tinnitus and slight decreased hearing on the left for more than 10 years but denies that this has progressed recently. She also has long-standing history of memory loss and word finding difficulties last several years. She states she was tried on Aricept but did not tolerate it. She has not tried medications like Namenda but is not keen to try new medicines. She also has chronic diplopia for which she has seen Dr. Thornton Park [facility] [place] and prescribed prism glasses which seem to help. She passes a cardiac surgeon October and cardiac valvuloplasty soon. Patient denies any tingling numbness and burning in her pain. She denies any neck pain and radicular pain. She has no history of falls with head injury a significant loss of consciousness. She has no known prior history of strokes TIAs seizures. She denies peripheral vision loss on the right of bumping into objects. She has history of atrial  fibrillation and is on long-term anticoagulation with warfarin. Update 02/29/2016 : She returns for follow-up after last visit 2 months ago. She is accompanied by husband. Patient continues to have memory difficulties and unchanged. She has not thought about trying Aricept and is willing to do so. She states she may have tried it a few years ago but her husband is not sure about this. She had lab work done for reversible causes of memory loss including vitamin B12 and TSH which were normal. CT angiogram of the brain and neck showed no significant intra-or extracranial posterior circulation stenosis. She had mitral valve annuloplasty in [place] by Dr Bridgette Habermann.. She is on warfarin and his getting easy bruisability but no bleeding episodes. Her blood pressure is well controlled and today it is 115/67. She continues to have a hoarse voice due to vocal cord palsy. She did see Dr. Ellene Route for meningioma who recommended conservative management and no treatment. Update 05/09/2016 : She returns for follow-up after last visit 3 months ago. Her husband states that patient is tolerating Aricept 10 mg but it does make her sleepy and the patient feels that this is because of Aricept which she takes at night. She would like to try to switch it during the daytime. Her memory loss and confusion seemed about unchanged. She's not had any significant decline in her activities of daily living and cognitive issues either. On the Mini-Mental Status Examination she scored 22/30 today compared to 23/30 at last visit. Patient has not had any issues with  delusions, hallucinations, agitation or unsafe behavior. He is able to walk reasonably well and has had no falls or injuries. ROS:   14 system review of systems is positive for    trouble swallowing, shortness of breath, insomnia, easy bruising and bleeding, agitation, anxiety and nervousness and all other systems negative  PMH:  Past Medical History:  Diagnosis Date  . Allergy   .  Alopecia   . Anxiety   . Arthritis   . Atrial fibrillation, persistent ([place]) 10/30/2015  . Cataract   . DDD (degenerative disc disease), cervical   . Diverticulosis of colon   . Esotropia   . History of double vision   . Hyperlipidemia   . Hypertension   . Hypertrophic cardiomyopathy ([place])    by history but not identified on the most recent echo  . Meningioma (HCC)    cerebellopontine angle   . Mild mitral valve prolapse   . Mitral insufficiency    Severe with anterior flail leaflet.    . Osteoporosis   . Postmenopausal HRT (hormone replacement therapy)   . 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 Vertebrobasilar insufficiency   . Vocal cord paralysis, unilateral complete     Social History:  Social History   Social History  . Marital status: Married    Spouse name: N/A  . Number of children: N/A  . Years of education: N/A   Occupational History  . Not on file.   Social History Main Topics  . Smoking status: Never Smoker  . Smokeless tobacco: Never Used     Comment: Rare previously  . Alcohol use No  . Drug use: No  . Sexual activity: No   Other Topics Concern  . Not on file   Social History Narrative  . No narrative on file    Medications:   Current Outpatient Prescriptions on File Prior to Visit  Medication Sig Dispense Refill  . Cholecalciferol (VITAMIN D3) 2000 UNITS TABS Take 2,000 Units by mouth daily.    Marland Kitchen donepezil (ARICEPT) 10 MG tablet Take 1 tablet (10 mg total) by mouth at bedtime. 30 tablet 3  . DULoxetine (CYMBALTA) 30 MG capsule Take 2 capsules (60mg ) once daily (Patient taking differently: Take 2 capsules (60mg ) once daily) 60 capsule 0  . folic acid (FOLVITE) 1 MG tablet Take 1 mg by mouth daily.    . furosemide (LASIX) 40 MG tablet Take 20 mg by mouth daily. Take 1/2 tablet by mouth ( 20 mg ) daily 90 tablet 3  . metoprolol succinate (TOPROL-XL) 25 MG 24 hr tablet Take 0.5 tablets (12.5 mg total)  by mouth daily. 45 tablet 3  . warfarin (COUMADIN) 5 MG tablet Take 1 tablet (5 mg total) by mouth daily. 30 tablet 6   No current facility-administered medications on file prior to visit.     Allergies:   Allergies  Allergen Reactions  . Diphenhydramine Hcl Hives  . Livalo [Pitavastatin Calcium] Other (See Comments)    Pt doesn't remember reaction  . Sulfa Antibiotics Other (See Comments)    Pt doesn't remember reaction  . Zocor [Simvastatin] Other (See Comments)    Pt doesn't remember reaction  . Actonel [Risedronate Sodium] Other (See Comments)    Esophagitis   . Fosamax [Alendronate Sodium] Other (See Comments)    Esophagitis   . Pravastatin Other (See Comments)    Extreme leg cramping.    Physical Exam General: Frail  elderly Caucasian lady seated, in no evident distress Head: head normocephalic and atraumatic.   Neck: supple with no carotid or supraclavicular bruits Cardiovascular: regular rate and rhythm, no murmurs Musculoskeletal: no deformity Skin:  no rash/petichiae Vascular:  Normal pulses all extremities  Neurologic Exam Mental Status: Awake and fully alert. Oriented to place and time. Recent and remote memory Diminished Attention span, concentration and fund of knowledge diminished. Poor recall 1/3. Animal naming diminished 5 only.. Mood and affect appropriate. Mini-Mental status exam scored 22/30 ( last visit  23/30 ) with deficits in orientation, recall, calculation following three-step commands. Able to name 7 animals with 4 legs. Clock drawing 4/4. Cranial Nerves: Fundoscopic exam not done. Pupils equal, briskly reactive to light. Extraocular movements full without nystagmus. Visual fields full to confrontation. Hearing intact. Facial sensation intact. Face, tongue, palate moves normally and symmetrically.  Motor: Normal bulk and tone. Normal strength in all tested extremity muscles. Sensory.: intact to touch , pinprick , position and vibratory sensation.    Coordination: Rapid alternating movements normal in all extremities. Finger-to-nose and heel-to-shin performed accurately bilaterally. Gait and Station: Arises from chair without difficulty. Stance is normal. Gait demonstrates normal stride and mild imbalance. Retropulsion present.. Not able to heel, toe and tandem walk without difficulty.  Reflexes: 1+ and symmetric. Toes downgoing.       ASSESSMENT: 8 year Caucasian lady with history of  falls   likely multifactorial due to combination of known cerebellopontine angle tumor, vertebrobasilar insufficiency,silent left occipital infarct, cerebrovascular disease and mild dementia.    PLAN: I had long discussion with the patient and her son regarding her mild cognitive impairment and dizziness which appear to be stable. I recommend she switch to Aricept to either breakfast or lunch time and see if her nighttime complaints of dizziness and inability to sleep improved. May consider adding Namenda in the future if needed. She was counseled to use a cane at all times and avoid quick and sudden movements and falls. Greater than 50% time during this 25 minute visit was spent on counseling and coordination of care about her chronic dizziness, gait instability and memory loss She will return for follow-up in 6 months or call earlier if necessary. Antony Contras, MD  [facility] [address] [place], [place] [address]  Phone [phone] Fax [phone] Note: This document was prepared with digital dictation and possible smart phrase technology. Any transcriptional errors that result from this process are unintentional.

## 2016-05-09 NOTE — Patient Instructions (Signed)
I had long discussion with the patient and her son regarding her mild cognitive impairment and dizziness which appear to be stable. I recommend she switch to Aricept to either breakfast or lunch time and see if her nighttime complaints of dizziness and inability to sleep improved. May consider adding Namenda in the future if needed. She was counseled to use a cane at all times and avoid quick and sudden movements and falls. She will return for follow-up in 6 months or call earlier if necessary.

## 2016-05-16 ENCOUNTER — Encounter: Payer: Self-pay | Admitting: Pharmacist

## 2016-05-17 ENCOUNTER — Encounter: Payer: Self-pay | Admitting: Family Medicine

## 2016-05-17 ENCOUNTER — Ambulatory Visit (INDEPENDENT_AMBULATORY_CARE_PROVIDER_SITE_OTHER): Payer: Medicare Other | Admitting: Family Medicine

## 2016-05-17 VITALS — BP 121/73 | HR 78 | Temp 97.6°F | Ht 64.0 in | Wt 133.0 lb

## 2016-05-17 DIAGNOSIS — R413 Other amnesia: Secondary | ICD-10-CM

## 2016-05-17 DIAGNOSIS — Z7901 Long term (current) use of anticoagulants: Secondary | ICD-10-CM

## 2016-05-17 DIAGNOSIS — E7800 Pure hypercholesterolemia, unspecified: Secondary | ICD-10-CM

## 2016-05-17 DIAGNOSIS — I421 Obstructive hypertrophic cardiomyopathy: Secondary | ICD-10-CM

## 2016-05-17 DIAGNOSIS — I480 Paroxysmal atrial fibrillation: Secondary | ICD-10-CM

## 2016-05-17 DIAGNOSIS — I10 Essential (primary) hypertension: Secondary | ICD-10-CM | POA: Diagnosis not present

## 2016-05-17 DIAGNOSIS — J3800 Paralysis of vocal cords and larynx, unspecified: Secondary | ICD-10-CM

## 2016-05-17 DIAGNOSIS — I4819 Other persistent atrial fibrillation: Secondary | ICD-10-CM

## 2016-05-17 DIAGNOSIS — D329 Benign neoplasm of meninges, unspecified: Secondary | ICD-10-CM | POA: Diagnosis not present

## 2016-05-17 DIAGNOSIS — F411 Generalized anxiety disorder: Secondary | ICD-10-CM | POA: Diagnosis not present

## 2016-05-17 DIAGNOSIS — I481 Persistent atrial fibrillation: Secondary | ICD-10-CM | POA: Diagnosis not present

## 2016-05-17 LAB — COAGUCHEK XS/INR WAIVED
INR: 2.8 — ABNORMAL HIGH (ref 0.9–1.1)
PROTHROMBIN TIME: 33.7 s

## 2016-05-17 NOTE — Progress Notes (Signed)
Subjective:    Patient ID: Patricia Villa, female    DOB: 1932-12-01, 83 y.o.   MRN: [id number]  HPI Patient here today for acid reflux issues, back pain and a protime check. She is accompanied today by her husband and her caregiver. The patient's son is with her during the visit. She continues to complain of pain in general. She has a tendency to like to take pain medicines. We do not want to give any pain medication. She has had recent mitral clip surgery and has done extremely well with this and is being followed regularly by the cardiologist. It was recommended this patient have cardiac rehabilitation but she never did this. She did have home physical therapy and this may have helped a little bit getting her stronger again. She will take Tylenol for pain but agrees after our conversation today to take some regular strength Tylenol no more than 6 daily if needed for severe pain. She is not having complaints of chest pain or shortness of breath just weakness and hoarseness. Her GI tract is working okay she is passing her water without problems. The son was in the room during our phone conversation and has an understanding of the things that need to be done and he is agreed to help her follow through with these things. She also is having a little bit of discomfort in her chest with swallowing and she will take a ranitidine or Zantac 150 twice daily over-the-counter before breakfast and supper   Patient Active Problem List   Diagnosis Date Noted  . Vocal cord paralysis, unilateral complete   . Vertebrobasilar insufficiency   . Stroke due to embolism of left cerebellar artery ([place])   . Aortic atherosclerosis ([place]) 11/20/2015  . Atrial fibrillation, persistent ([place]) 10/30/2015  . Atrial fibrillation ([place]) 11/23/2014  . Osteoporosis 08/31/2014  . Chronic neck pain 06/07/2014  . DDD (degenerative disc disease), cervical 01/24/2014  . Hyperlipidemia 05/17/2013  . Colitis 01/20/2013  . Generalized  anxiety disorder 08/05/2012  . Meningioma ([place]) 08/05/2012  . Bruit 10/17/2010  . Essential hypertension, benign 10/04/2009  . MITRAL INSUFFICIENCY 09/26/2009  . Mitral valve disorder 09/26/2009  . Hypertrophic obstructive cardiomyopathy ([place]) 09/26/2009   Outpatient Encounter Prescriptions as of 05/17/2016  Medication Sig  . Cholecalciferol (VITAMIN D3) 2000 UNITS TABS Take 2,000 Units by mouth daily.  Marland Kitchen donepezil (ARICEPT) 10 MG tablet Take 1 tablet (10 mg total) by mouth at bedtime.  . DULoxetine (CYMBALTA) 30 MG capsule Take 2 capsules (60mg ) once daily (Patient taking differently: Take 2 capsules (60mg ) once daily)  . folic acid (FOLVITE) 1 MG tablet Take 1 mg by mouth daily.  . furosemide (LASIX) 40 MG tablet Take 20 mg by mouth daily. Take 1/2 tablet by mouth ( 20 mg ) daily  . metoprolol succinate (TOPROL-XL) 25 MG 24 hr tablet Take 0.5 tablets (12.5 mg total) by mouth daily.  Marland Kitchen warfarin (COUMADIN) 5 MG tablet Take 1 tablet (5 mg total) by mouth daily.  Marland Kitchen triamcinolone cream (KENALOG) 0.1 %    No facility-administered encounter medications on file as of 05/17/2016.       Review of Systems  HENT: Negative.        Left ear ?scab   Eyes: Negative.   Respiratory: Negative.   Cardiovascular: Negative.   Gastrointestinal: Negative.        Reflux  Endocrine: Negative.   Genitourinary: Negative.   Musculoskeletal: Positive for back pain.  Skin: Negative.  Itching skin - all over (seen DERM)  Allergic/Immunologic: Negative.   Neurological: Positive for weakness.  Hematological: Negative.   Psychiatric/Behavioral: Negative.        Sleep issues       Objective:   Physical Exam  Constitutional: She is oriented to person, place, and time. She appears well-developed and well-nourished. She appears distressed.  The patient is pleasant and hard to understand due to her vocal cord paralysis. She appears somewhat older than her stated age.  HENT:  Head: Normocephalic and  atraumatic.  Right Ear: External ear normal.  Nose: Nose normal.  Mouth/Throat: Oropharynx is clear and moist.  Patient's left ear canal has a blood clot in the bottom where she has scratched and irritated ear canal and I have asked her to leave anything out of the ear canal which may further irritate this.  Eyes: Conjunctivae and EOM are normal. Pupils are equal, round, and reactive to light. Right eye exhibits no discharge. Left eye exhibits no discharge. No scleral icterus.  Neck: Normal range of motion. Neck supple. No thyromegaly present.  No bruits thyromegaly or anterior cervical adenopathy  Cardiovascular: Normal rate, regular rhythm and normal heart sounds.   No murmur heard. Pulmonary/Chest: Effort normal and breath sounds normal. No respiratory distress. She has no wheezes. She has no rales.  Clear anteriorly and posteriorly  Abdominal: Soft. Bowel sounds are normal. She exhibits no mass. There is no tenderness. There is no rebound and no guarding.  No abdominal tenderness but a lot of gas other than in the epigastric area and this is where if there's any tenderness is most prominent there.  Musculoskeletal: Normal range of motion. She exhibits no edema.  Lymphadenopathy:    She has no cervical adenopathy.  Neurological: She is alert and oriented to person, place, and time. She has normal reflexes. No cranial nerve deficit.  Skin: Skin is warm and dry. No rash noted.  Psychiatric: She has a normal mood and affect. Her behavior is normal. Judgment and thought content normal.  Nursing note and vitals reviewed.  BP 121/73 (BP Location: Left Arm)   Pulse 78   Temp 97.6 F (36.4 C) (Oral)   Ht 5\' 4"  (1.626 m)   Wt 133 lb (60.3 kg)   LMP 08/01/1982   BMI 22.83 kg/m         Assessment & Plan:  1. Paroxysmal atrial fibrillation (HCC) -The heart had a regular rate and rhythm today but she will continue with her Coumadin as recommended by clinical pharmacy - CoaguChek XS/INR  Waived  2. Pure hypercholesterolemia -Continue with as aggressive therapeutic lifestyle changes as possible  3. Atrial fibrillation, persistent ([place]) -Continue follow-up with cardiology  4. Essential hypertension, benign -Low pressures good today and she will continue with current treatment  5. Chronic anticoagulation -Continue with Coumadin as recommended  6. Memory deficit -Continue with Aricept  7. Hypertrophic obstructive cardiomyopathy ([place]) -Continue follow-up with cardiology  8. Meningioma ([place]) -No complaints with headaches today and she will continue with routine follow-up with neurosurgery  9. Vocal cord paralysis -Follow-up with ear nose and throat pending visit to cardiology  10. Generalized anxiety disorder -Reassurance and working from a point of strength to get her feeling better is the best way to control her anxiety.  Patient Instructions   Anticoagulation Dose Instructions as of 05/17/2016      Dorene Grebe Tue Wed Thu Fri Sat   New Dose 5 mg 5 mg 5 mg 7.5 mg 5  mg 5 mg 5 mg    Description   Continue current warfarin dose of 5mg  - 1 tablet daily except take 1 and 1/2 tablets on Wednesdays  INR today:  2.8 Goal is 2.0 to 3.0     Follow-up with cardiology as planned Arrange for cardiac rehabilitation After seeing cardiologist and discussing patient's pulmonary hypertension, also discuss with him her ability to have this vocal cord surgery days being recommended at [facility] If he feels that it is reasonable to try this approach then you can call and make an appointment for them to see her again for possible surgery She definitely needs to get stronger She needs to try to drink plenty of fluids She can use some warm wet compresses over the upper back if needed for pain no heating pad. Take ranitidine 150 mg twice daily before breakfast and supper for reflux and heartburn Please use scent free soaps fabric softeners and detergents  Arrie Senate  MD

## 2016-05-17 NOTE — Patient Instructions (Addendum)
Anticoagulation Dose Instructions as of 05/17/2016      Dorene Grebe Tue Wed Thu Fri Sat   New Dose 5 mg 5 mg 5 mg 7.5 mg 5 mg 5 mg 5 mg    Description   Continue current warfarin dose of 5mg  - 1 tablet daily except take 1 and 1/2 tablets on Wednesdays  INR today:  2.8 Goal is 2.0 to 3.0     Follow-up with cardiology as planned Arrange for cardiac rehabilitation After seeing cardiologist and discussing patient's pulmonary hypertension, also discuss with him her ability to have this vocal cord surgery days being recommended at [facility] If he feels that it is reasonable to try this approach then you can call and make an appointment for them to see her again for possible surgery She definitely needs to get stronger She needs to try to drink plenty of fluids She can use some warm wet compresses over the upper back if needed for pain no heating pad. Take ranitidine 150 mg twice daily before breakfast and supper for reflux and heartburn Please use scent free soaps fabric softeners and detergents

## 2016-05-28 NOTE — Progress Notes (Signed)
HPI The patient presents for followup of MR and atrial fib.  She had MR with severe a partially flail anterior leaflet with moderate to severe eccentric MR.   There was also moderately severe TR with elevated pulmonary pressures.    She had TEE which confirmed ruptured chords with severe flail motion of the anterior leaflet.  She was seen by Dr. Burt Knack and Dr. Roxy Manns.  She had an extensive discussion to dedice on the best course of therapy for her MV.  Left and right heart catheterization was notable for moderate coronary artery disease with 50% stenosis of the mid left anterior descending coronary artery and otherwise nonobstructive disease.  There was moderate pulmonary hypertension with large V waves on wedge tracing consistent with severe mitral regurgitation. Cardiac output was relatively low with mixed venous oxygen saturation only 47%, cardiac output 3.0 L/m corresponding to cardiac index of 1.8.   She was subsequently sent to Dr. Bridgette Habermann at [facility].   She had mitral valve repair using two mitral clips.  I was able to review the results of an echo done at [facility] in Nov.  She had EF 70% with moderate concentric hypertrophy of the LV.  She had mild MR.  There was moderate TR.  Peak pulmonary pressure was elevated at 65.  There was no change from prior.  She has multiple cardiovascular complaints and somatic complaints. Sounds like she is aspirating food contents because of paralyzed vocal cord. She has chronic dyspnea wears oxygen at night. She still not sleeping well because she is walks of coughing and phlegm. She hasn't gone back to [facility] to see about her vocal cords yet. She didn't participate in cardiac rehabilitation which was suggested. She's not having any chest pressure, neck or arm discomfort. She's not noticing any palpitations, presyncope or syncope.  Allergies  Allergen Reactions  . Diphenhydramine Hcl Hives  . Livalo [Pitavastatin Calcium] Other (See Comments)    Pt doesn't remember reaction   . Sulfa Antibiotics Other (See Comments)    Pt doesn't remember reaction  . Zocor [Simvastatin] Other (See Comments)    Pt doesn't remember reaction  . Actonel [Risedronate Sodium] Other (See Comments)    Esophagitis   . Fosamax [Alendronate Sodium] Other (See Comments)    Esophagitis   . Pravastatin Other (See Comments)    Extreme leg cramping.    Current Outpatient Prescriptions  Medication Sig Dispense Refill  . Cholecalciferol (VITAMIN D3) 2000 UNITS TABS Take 2,000 Units by mouth daily.    Marland Kitchen donepezil (ARICEPT) 10 MG tablet Take 1 tablet (10 mg total) by mouth at bedtime. 30 tablet 3  . DULoxetine (CYMBALTA) 30 MG capsule Take 2 capsules (60mg ) once daily (Patient taking differently: Take 2 capsules (60mg ) once daily) 60 capsule 0  . folic acid (FOLVITE) 1 MG tablet Take 1 mg by mouth daily.    . furosemide (LASIX) 40 MG tablet Take 20 mg by mouth daily. Take 1/2 tablet by mouth ( 20 mg ) daily 90 tablet 3  . metoprolol succinate (TOPROL-XL) 25 MG 24 hr tablet Take 0.5 tablets (12.5 mg total) by mouth daily. 45 tablet 3  . triamcinolone cream (KENALOG) 0.1 %     . warfarin (COUMADIN) 5 MG tablet Take 1 tablet (5 mg total) by mouth daily. 30 tablet 6   No current facility-administered medications for this visit.     Past Medical History:  Diagnosis Date  . Allergy   . Alopecia   . Anxiety   .  Arthritis   . Atrial fibrillation, persistent ([place]) 10/30/2015  . Cataract   . DDD (degenerative disc disease), cervical   . Diverticulosis of colon   . Esotropia   . History of double vision   . Hyperlipidemia   . Hypertension   . Hypertrophic cardiomyopathy ([place])    by history but not identified on the most recent echo  . Meningioma (HCC)    cerebellopontine angle   . Mild mitral valve prolapse   . Mitral insufficiency    Severe with anterior flail leaflet.    . Osteoporosis   . Postmenopausal HRT (hormone replacement therapy)   . Stroke due to embolism of left cerebellar  artery (HCC)    Silent - noted on MRI  . Vertebrobasilar insufficiency   . Vocal cord paralysis, unilateral complete     Past Surgical History:  Procedure Laterality Date  . ABDOMINAL HYSTERECTOMY    . CARDIAC CATHETERIZATION N/A 01/02/2016   Procedure: Right/Left Heart Cath and Coronary Angiography;  Surgeon: Sherren Mocha, MD;  Location: [facility];  Service: Cardiovascular;  Laterality: N/A;  . CATARACT EXTRACTION, BILATERAL    . EYE SURGERY    . TEE WITHOUT CARDIOVERSION N/A 12/12/2015   Procedure: TRANSESOPHAGEAL ECHOCARDIOGRAM (TEE);  Surgeon: Lelon Perla, MD;  Location: [facility] ENDOSCOPY;  Service: Cardiovascular;  Laterality: N/A;  . TONSILLECTOMY    . VEIN SURGERY      ROS:  Insomnia, cough.  As stated in the HPI and negative for all other systems.  PHYSICAL EXAM BP 133/68   Pulse 68   Ht 5\' 4"  (1.626 m)   Wt 136 lb (61.7 kg)   LMP 08/01/1982   BMI 23.34 kg/m  GENERAL:  Frail appearing NECK:  Positive jugular venous distention, waveform within normal limits, carotid upstroke brisk and symmetric, no bruits, no thyromegaly LUNGS:  Clear to auscultation bilaterally CHEST:  Unremarkable HEART:  PMI not displaced or sustained,S1 and S2 within normal limits, no S3,  no clicks, no rubs,  2 out of 6 apical holosystolic murmur, no diastolic murmurs, irregular ABD:  Flat, positive bowel sounds normal in frequency in pitch, no bruits, no rebound, no guarding, no midline pulsatile mass, no hepatomegaly, no splenomegaly EXT:  2 plus pulses throughout, no edema, no cyanosis no clubbing, right femoral access site with slight ecchymosis and bruit SKIN:  No rashes no nodules  Lab Results  Component Value Date   TSH 0.780 12/20/2015   Lab Results  Component Value Date   WBC 8.0 04/02/2016   HGB 11.4 (L) 01/02/2016   HCT 35.5 04/02/2016   MCV 92 04/02/2016   PLT 264 04/02/2016    ASSESSMENT AND PLAN  ATRIAL FIB:  The patient tolerates this. She's tolerating  anticoagulation. She has reasonable rate control. Given the size of the left atrium I would suspect that cardioversion would either be unsuccessful or not successful long-term. She will continue with rate control and anticoagulation.  Ms. IVRY DANESE has a CHA2DS2 - VASc score of 4 with a risk of stroke of 4%.    MITRAL INSUFFICIENCY:   This was followed up at [facility] with results as above.  She is to follow-up with them in one year.  HTN:  The blood pressure is controlled and she'll continue the meds as listed.   HOARSENESS:  I've encouraged her now to follow-up with [facility] where she was being evaluated now that she's had her cardiac situation addressed.  She clearly aspirates. They want to  do an injection of her vocal cords and I think she would be a candidate for this procedure now that her mitral valve was fixed.  PULMONARY HTN:    I suspect her pulmonary hypertension is related to her cardiac disease. At her last right heart cath she had a large V wave. However, her pressure did not come down with mitral valve clipping. I will send her to Dr. Lamonte Sakai to consider right heart cath and whether she might be a candidate for pulmonary hypertension therapies. I'd like her dyspnea in cardiopulmonary rehabilitation.

## 2016-05-29 ENCOUNTER — Encounter: Payer: Self-pay | Admitting: Cardiology

## 2016-05-29 ENCOUNTER — Ambulatory Visit (INDEPENDENT_AMBULATORY_CARE_PROVIDER_SITE_OTHER): Payer: Medicare Other | Admitting: Cardiology

## 2016-05-29 VITALS — BP 133/68 | HR 68 | Ht 64.0 in | Wt 136.0 lb

## 2016-05-29 DIAGNOSIS — I272 Pulmonary hypertension, unspecified: Secondary | ICD-10-CM | POA: Diagnosis not present

## 2016-05-29 DIAGNOSIS — I482 Chronic atrial fibrillation: Secondary | ICD-10-CM | POA: Diagnosis not present

## 2016-05-29 DIAGNOSIS — I4821 Permanent atrial fibrillation: Secondary | ICD-10-CM

## 2016-05-29 DIAGNOSIS — I080 Rheumatic disorders of both mitral and aortic valves: Secondary | ICD-10-CM | POA: Diagnosis not present

## 2016-05-29 NOTE — Patient Instructions (Signed)
Medication Instructions:  Continue current medications as listed.  Labwork: None  You have been referred to Dr Baltazar Apo for evaluation of Pulmonary Hypertension and will called with an appointment.  If you do not receive a call please contact them at [phone].  Follow-Up: Follow up with Dr Percival Spanish in [place] in 2 months.  If you need a refill on your cardiac medications before your next appointment, please call your pharmacy.  Thank you for choosing [facility]!!

## 2016-06-11 ENCOUNTER — Encounter: Payer: Self-pay | Admitting: Physician Assistant

## 2016-06-11 ENCOUNTER — Ambulatory Visit (INDEPENDENT_AMBULATORY_CARE_PROVIDER_SITE_OTHER): Payer: Medicare Other | Admitting: Physician Assistant

## 2016-06-11 VITALS — BP 122/78 | HR 76 | Temp 97.1°F | Ht 64.0 in | Wt 137.8 lb

## 2016-06-11 DIAGNOSIS — R300 Dysuria: Secondary | ICD-10-CM | POA: Diagnosis not present

## 2016-06-11 DIAGNOSIS — N3001 Acute cystitis with hematuria: Secondary | ICD-10-CM | POA: Diagnosis not present

## 2016-06-11 DIAGNOSIS — Z87448 Personal history of other diseases of urinary system: Secondary | ICD-10-CM | POA: Diagnosis not present

## 2016-06-11 LAB — URINALYSIS
BILIRUBIN UA: NEGATIVE
Glucose, UA: NEGATIVE
KETONES UA: NEGATIVE
LEUKOCYTES UA: NEGATIVE
Nitrite, UA: NEGATIVE
PH UA: 5 (ref 5.0–7.5)
PROTEIN UA: NEGATIVE
Specific Gravity, UA: 1.015 (ref 1.005–1.030)
Urobilinogen, Ur: 0.2 mg/dL (ref 0.2–1.0)

## 2016-06-11 MED ORDER — CIPROFLOXACIN HCL 500 MG PO TABS: 500.0000 mg | ORAL_TABLET | Freq: Two times a day (BID) | ORAL | 0 refills | Status: DC

## 2016-06-11 NOTE — Patient Instructions (Signed)
Urinary Tract Infection, Adult Introduction A urinary tract infection (UTI) is an infection of any part of the urinary tract. The urinary tract includes the:  Kidneys.  Ureters.  Bladder.  Urethra. These organs make, store, and get rid of pee (urine) in the body. Follow these instructions at home:  Take over-the-counter and prescription medicines only as told by your doctor.  If you were prescribed an antibiotic medicine, take it as told by your doctor. Do not stop taking the antibiotic even if you start to feel better.  Avoid the following drinks:  Alcohol.  Caffeine.  Tea.  Carbonated drinks.  Drink enough fluid to keep your pee clear or pale yellow.  Keep all follow-up visits as told by your doctor. This is important.  Make sure to:  Empty your bladder often and completely. Do not to hold pee for long periods of time.  Empty your bladder before and after sex.  Wipe from front to back after a bowel movement if you are female. Use each tissue one time when you wipe. Contact a doctor if:  You have back pain.  You have a fever.  You feel sick to your stomach (nauseous).  You throw up (vomit).  Your symptoms do not get better after 3 days.  Your symptoms go away and then come back. Get help right away if:  You have very bad back pain.  You have very bad lower belly (abdominal) pain.  You are throwing up and cannot keep down any medicines or water. This information is not intended to replace advice given to you by your health care provider. Make sure you discuss any questions you have with your health care provider. Document Released: 10/09/2007 Document Revised: 09/28/2015 Document Reviewed: 03/13/2015  2017 [employer]vier  

## 2016-06-12 DIAGNOSIS — N3001 Acute cystitis with hematuria: Secondary | ICD-10-CM | POA: Insufficient documentation

## 2016-06-12 DIAGNOSIS — Z87448 Personal history of other diseases of urinary system: Secondary | ICD-10-CM | POA: Insufficient documentation

## 2016-06-12 DIAGNOSIS — R300 Dysuria: Secondary | ICD-10-CM | POA: Insufficient documentation

## 2016-06-12 NOTE — Progress Notes (Signed)
BP 122/78   Pulse 76   Temp 97.1 F (36.2 C) (Oral)   Ht 5\' 4"  (1.626 m)   Wt 137 lb 12.8 oz (62.5 kg)   LMP 08/01/1982   BMI 23.65 kg/m    Subjective:    Patient ID: Patricia Villa, female    DOB: 1932/07/05, 83 y.o.   MRN: [id number]  HPI: Patricia Villa is a 83 y.o. female presenting on 06/11/2016 for No chief complaint on file.  Patient with known recurrent urinary tract infection comes in today for suprapubic discomfort and frequency. She states she does have nocturia. She denies any significant color change to the urine. She denies any fever or chills. She has some pressure in the perineal area. It is worse after she has been up. She has known cystocele and cystocele repair in the past. She does not feel anything coming out of her vaginal opening. She states she does have some relief if she lays down. It had a long discussion about the risk of cystocele aggravating urinary tract infections. I've encouraged her to let us know if she needs urology consult for recurrent cystocele.  Relevant past medical, surgical, family and social history reviewed and updated as indicated. Allergies and medications reviewed and updated.  Past Medical History:  Diagnosis Date  . Allergy   . Alopecia   . Anxiety   . Arthritis   . Atrial fibrillation, persistent ([place]) 10/30/2015  . Cataract   . DDD (degenerative disc disease), cervical   . Diverticulosis of colon   . Esotropia   . History of double vision   . Hyperlipidemia   . Hypertension   . Hypertrophic cardiomyopathy ([place])    by history but not identified on the most recent echo  . Meningioma (HCC)    cerebellopontine angle   . Mild mitral valve prolapse   . Mitral insufficiency    Severe with anterior flail leaflet.    . Osteoporosis   . Postmenopausal HRT (hormone replacement therapy)   . 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 Vertebrobasilar insufficiency   . Vocal cord paralysis,  unilateral complete     Past Surgical History:  Procedure Laterality Date  . ABDOMINAL HYSTERECTOMY    . CARDIAC CATHETERIZATION N/A 01/02/2016   Procedure: Right/Left Heart Cath and Coronary Angiography;  Surgeon: Sherren Mocha, MD;  Location: [facility];  Service: Cardiovascular;  Laterality: N/A;  . CATARACT EXTRACTION, BILATERAL    . EYE SURGERY    . TEE WITHOUT CARDIOVERSION N/A 12/12/2015   Procedure: TRANSESOPHAGEAL ECHOCARDIOGRAM (TEE);  Surgeon: Lelon Perla, MD;  Location: [facility] ENDOSCOPY;  Service: Cardiovascular;  Laterality: N/A;  . TONSILLECTOMY    . VEIN SURGERY      Review of Systems  Constitutional: Negative.   HENT: Negative.   Eyes: Negative.   Respiratory: Negative.   Gastrointestinal: Negative.   Genitourinary: Positive for difficulty urinating, dysuria, frequency, pelvic pain, urgency and vaginal pain. Negative for flank pain.    Allergies as of 06/11/2016      Reactions   Diphenhydramine Hcl Hives   Livalo [pitavastatin Calcium] Other (See Comments)   Pt doesn't remember reaction   Sulfa Antibiotics Other (See Comments)   Pt doesn't remember reaction   Zocor [simvastatin] Other (See Comments)   Pt doesn't remember reaction   Actonel [risedronate Sodium] Other (See Comments)   Esophagitis   Fosamax [alendronate Sodium] Other (See Comments)  Esophagitis    Pravastatin Other (See Comments)   Extreme leg cramping.      Medication List       Accurate as of 06/11/16 11:59 PM. Always use your most recent med list.          ciprofloxacin 500 MG tablet Commonly known as:  CIPRO Take 1 tablet (500 mg total) by mouth 2 (two) times daily.   donepezil 10 MG tablet Commonly known as:  ARICEPT Take 1 tablet (10 mg total) by mouth at bedtime.   DULoxetine 30 MG capsule Commonly known as:  CYMBALTA Take 2 capsules (60mg ) once daily   folic acid 1 MG tablet Commonly known as:  FOLVITE Take 1 mg by mouth daily.   furosemide 40 MG  tablet Commonly known as:  LASIX Take 20 mg by mouth daily. Take 1/2 tablet by mouth ( 20 mg ) daily   metoprolol succinate 25 MG 24 hr tablet Commonly known as:  TOPROL-XL Take 0.5 tablets (12.5 mg total) by mouth daily.   triamcinolone cream 0.1 % Commonly known as:  KENALOG   Vitamin D3 2000 units Tabs Take 2,000 Units by mouth daily.   warfarin 5 MG tablet Commonly known as:  COUMADIN Take 1 tablet (5 mg total) by mouth daily.          Objective:    BP 122/78   Pulse 76   Temp 97.1 F (36.2 C) (Oral)   Ht 5\' 4"  (1.626 m)   Wt 137 lb 12.8 oz (62.5 kg)   LMP 08/01/1982   BMI 23.65 kg/m   Allergies  Allergen Reactions  . Diphenhydramine Hcl Hives  . Livalo [Pitavastatin Calcium] Other (See Comments)    Pt doesn't remember reaction  . Sulfa Antibiotics Other (See Comments)    Pt doesn't remember reaction  . Zocor [Simvastatin] Other (See Comments)    Pt doesn't remember reaction  . Actonel [Risedronate Sodium] Other (See Comments)    Esophagitis   . Fosamax [Alendronate Sodium] Other (See Comments)    Esophagitis   . Pravastatin Other (See Comments)    Extreme leg cramping.    Physical Exam  Constitutional: She is oriented to person, place, and time. She appears well-developed and well-nourished.  HENT:  Head: Normocephalic and atraumatic.  Eyes: Conjunctivae are normal. Pupils are equal, round, and reactive to light.  Cardiovascular: Normal rate, regular rhythm, normal heart sounds and intact distal pulses.   Pulmonary/Chest: Effort normal and breath sounds normal.  Abdominal: Soft. Bowel sounds are normal. She exhibits no distension and no mass. There is tenderness in the suprapubic area. There is no rebound, no guarding and no CVA tenderness.  Neurological: She is alert and oriented to person, place, and time. She has normal reflexes.  Skin: Skin is warm and dry. No rash noted.  Psychiatric: She has a normal mood and affect. Her behavior is normal.  Judgment and thought content normal.    Results for orders placed or performed in visit on 06/11/16  Urinalysis  Result Value Ref Range   Specific Gravity, UA 1.015 1.005 - 1.030   pH, UA 5.0 5.0 - 7.5   Color, UA Yellow Yellow   Appearance Ur Cloudy (A) Clear   Leukocytes, UA Negative Negative   Protein, UA Negative Negative/Trace   Glucose, UA Negative Negative   Ketones, UA Negative Negative   RBC, UA 1+ (A) Negative   Bilirubin, UA Negative Negative   Urobilinogen, Ur 0.2 0.2 - 1.0 mg/dL  Nitrite, UA Negative Negative      Assessment & Plan:   1. Dysuria - Urine culture - Urinalysis - ciprofloxacin (CIPRO) 500 MG tablet; Take 1 tablet (500 mg total) by mouth 2 (two) times daily.  Dispense: 20 tablet; Refill: 0  2. Acute cystitis with hematuria - ciprofloxacin (CIPRO) 500 MG tablet; Take 1 tablet (500 mg total) by mouth 2 (two) times daily.  Dispense: 20 tablet; Refill: 0  3. History of cystocele Rest Consider urology consult if not improving or recurrent UTI begins   Continue all other maintenance medications as listed above.  Follow up plan: No Follow-up on file.  Orders Placed This Encounter  Procedures  . Urine culture  . Urinalysis    Educational handout given for UTI  Terald Sleeper PA-C [place] [address]  [place], [place] [address] [phone]   06/12/2016, 1:41 PM

## 2016-06-13 DIAGNOSIS — R1310 Dysphagia, unspecified: Secondary | ICD-10-CM | POA: Diagnosis not present

## 2016-06-13 DIAGNOSIS — R490 Dysphonia: Secondary | ICD-10-CM | POA: Diagnosis not present

## 2016-06-13 DIAGNOSIS — J3801 Paralysis of vocal cords and larynx, unilateral: Secondary | ICD-10-CM | POA: Diagnosis not present

## 2016-06-14 ENCOUNTER — Telehealth: Payer: Self-pay | Admitting: *Deleted

## 2016-06-14 LAB — URINE CULTURE

## 2016-06-14 MED ORDER — FLUCONAZOLE 150 MG PO TABS: 150.0000 mg | ORAL_TABLET | Freq: Once | ORAL | 0 refills | Status: AC

## 2016-06-14 NOTE — Telephone Encounter (Signed)
Incoming call from pt Taking Cipro for UTI Now has yeast Per Dr Laurance Flatten Diflucan sent into [facility] pharmacy rck INR next week

## 2016-06-17 ENCOUNTER — Other Ambulatory Visit: Payer: Medicare Other

## 2016-06-17 ENCOUNTER — Ambulatory Visit (INDEPENDENT_AMBULATORY_CARE_PROVIDER_SITE_OTHER): Payer: Medicare Other | Admitting: Pharmacist

## 2016-06-17 ENCOUNTER — Other Ambulatory Visit: Payer: Self-pay | Admitting: Neurology

## 2016-06-17 DIAGNOSIS — I481 Persistent atrial fibrillation: Secondary | ICD-10-CM

## 2016-06-17 DIAGNOSIS — I4819 Other persistent atrial fibrillation: Secondary | ICD-10-CM

## 2016-06-17 DIAGNOSIS — I480 Paroxysmal atrial fibrillation: Secondary | ICD-10-CM | POA: Diagnosis not present

## 2016-06-17 LAB — COAGUCHEK XS/INR WAIVED
INR: 4.7 — ABNORMAL HIGH (ref 0.9–1.1)
Prothrombin Time: 56.2 s

## 2016-06-18 ENCOUNTER — Other Ambulatory Visit: Payer: Self-pay

## 2016-06-18 MED ORDER — DONEPEZIL HCL 10 MG PO TABS: 10.0000 mg | ORAL_TABLET | Freq: Every day | ORAL | 3 refills | Status: DC

## 2016-06-25 ENCOUNTER — Encounter: Payer: Self-pay | Admitting: Nurse Practitioner

## 2016-06-25 ENCOUNTER — Ambulatory Visit (INDEPENDENT_AMBULATORY_CARE_PROVIDER_SITE_OTHER): Payer: Medicare Other | Admitting: Nurse Practitioner

## 2016-06-25 VITALS — BP 126/72 | HR 79 | Temp 96.7°F | Ht 64.0 in | Wt 140.0 lb

## 2016-06-25 DIAGNOSIS — L989 Disorder of the skin and subcutaneous tissue, unspecified: Secondary | ICD-10-CM

## 2016-06-25 DIAGNOSIS — K5900 Constipation, unspecified: Secondary | ICD-10-CM

## 2016-06-25 NOTE — Patient Instructions (Signed)
Constipation, Adult °Constipation is when a person: °· Poops (has a bowel movement) fewer times in a week than normal. °· Has a hard time pooping. °· Has poop that is dry, hard, or bigger than normal. ° °Follow these instructions at home: °Eating and drinking ° °· Eat foods that have a lot of fiber, such as: °? Fresh fruits and vegetables. °? Whole grains. °? Beans. °· Eat less of foods that are high in fat, low in fiber, or overly processed, such as: °? French fries. °? Hamburgers. °? Cookies. °? Candy. °? Soda. °· Drink enough fluid to keep your pee (urine) clear or pale yellow. °General instructions °· Exercise regularly or as told by your doctor. °· Go to the restroom when you feel like you need to poop. Do not hold it in. °· Take over-the-counter and prescription medicines only as told by your doctor. These include any fiber supplements. °· Do pelvic floor retraining exercises, such as: °? Doing deep breathing while relaxing your lower belly (abdomen). °? Relaxing your pelvic floor while pooping. °· Watch your condition for any changes. °· Keep all follow-up visits as told by your doctor. This is important. °Contact a doctor if: °· You have pain that gets worse. °· You have a fever. °· You have not pooped for 4 days. °· You throw up (vomit). °· You are not hungry. °· You lose weight. °· You are bleeding from the anus. °· You have thin, pencil-like poop (stool). °Get help right away if: °· You have a fever, and your symptoms suddenly get worse. °· You leak poop or have blood in your poop. °· Your belly feels hard or bigger than normal (is bloated). °· You have very bad belly pain. °· You feel dizzy or you faint. °This information is not intended to replace advice given to you by your health care provider. Make sure you discuss any questions you have with your health care provider. °Document Released: 10/09/2007 Document Revised: 11/10/2015 Document Reviewed: 10/11/2015 °Elsevier Interactive Patient Education ©  2017 Elsevier Inc. ° °

## 2016-06-25 NOTE — Progress Notes (Signed)
   Subjective:    Patient ID: Patricia Villa, female    DOB: 05/07/1932, 83 y.o.   MRN: [id number]  HPI Patient is brought in by her caregiver- she says that she has bumps popping up on her hands and arms that itch and have a "core" in them. Has been appearing for several days.Her caregiver also mentioned that she has constipation. SHe has not been using her miralax daily like she is suppose to.    Review of Systems  Constitutional: Negative.   HENT: Negative.   Respiratory: Negative.   Cardiovascular: Negative.   Gastrointestinal: Positive for constipation.  Skin: Positive for rash.  Neurological: Negative.   Psychiatric/Behavioral: Negative.   All other systems reviewed and are negative.      Objective:   Physical Exam  Constitutional: She appears well-developed and well-nourished.  Cardiovascular: Normal rate.   Murmur heard. Pulmonary/Chest: Effort normal.  Abdominal: Soft. Bowel sounds are normal. She exhibits no distension. There is no tenderness.  Skin:  Small flesh colored raised lesion that are barely visible on bil hands and forearms- some areas appear to have been picked at and scratched.  Psychiatric: She has a normal mood and affect. Her behavior is normal. Judgment and thought content normal.   BP 126/72   Pulse 79   Temp (!) 96.7 F (35.9 C) (Oral)   Ht 5\' 4"  (1.626 m)   Wt 140 lb (63.5 kg)   LMP 08/01/1982   BMI 24.03 kg/m        Assessment & Plan:  1. Skin lesions, generalized Do not pick or scratch lesions Chang eto dove soap - Ambulatory referral to Dermatology  2. Constipation, unspecified constipation type miralax daily in juice Increase fiber in diet FOllow up prn  Mary-Margaret Hassell Done, FNP

## 2016-06-26 ENCOUNTER — Other Ambulatory Visit: Payer: Self-pay | Admitting: *Deleted

## 2016-06-26 MED ORDER — WARFARIN SODIUM 5 MG PO TABS: 5.0000 mg | ORAL_TABLET | Freq: Every day | ORAL | 0 refills | Status: DC

## 2016-06-28 ENCOUNTER — Ambulatory Visit (INDEPENDENT_AMBULATORY_CARE_PROVIDER_SITE_OTHER): Payer: Medicare Other | Admitting: Pharmacist

## 2016-06-28 DIAGNOSIS — I480 Paroxysmal atrial fibrillation: Secondary | ICD-10-CM | POA: Diagnosis not present

## 2016-06-28 DIAGNOSIS — I4819 Other persistent atrial fibrillation: Secondary | ICD-10-CM

## 2016-06-28 DIAGNOSIS — I481 Persistent atrial fibrillation: Secondary | ICD-10-CM

## 2016-06-28 LAB — COAGUCHEK XS/INR WAIVED
INR: 3 — AB (ref 0.9–1.1)
Prothrombin Time: 35.4 s

## 2016-06-28 NOTE — Patient Instructions (Signed)
Anticoagulation Dose Instructions as of 06/28/2016      Patricia Villa Tue Wed Thu Fri Sat   New Dose 5 mg 5 mg 5 mg 5 mg 5 mg 5 mg 5 mg    Description   Continue to take warfarin 5mg  dose of 1 tablet once a day.  INR today:  3.0 (goal: 2-3)

## 2016-07-02 ENCOUNTER — Ambulatory Visit (INDEPENDENT_AMBULATORY_CARE_PROVIDER_SITE_OTHER): Payer: Medicare Other | Admitting: Family

## 2016-07-02 ENCOUNTER — Telehealth: Payer: Self-pay | Admitting: Family Medicine

## 2016-07-02 ENCOUNTER — Encounter: Payer: Self-pay | Admitting: Family

## 2016-07-02 VITALS — BP 126/70 | HR 74 | Temp 96.7°F | Ht 64.0 in | Wt 137.6 lb

## 2016-07-02 DIAGNOSIS — W01190A Fall on same level from slipping, tripping and stumbling with subsequent striking against furniture, initial encounter: Secondary | ICD-10-CM

## 2016-07-02 DIAGNOSIS — Y92009 Unspecified place in unspecified non-institutional (private) residence as the place of occurrence of the external cause: Secondary | ICD-10-CM

## 2016-07-02 DIAGNOSIS — S40022A Contusion of left upper arm, initial encounter: Secondary | ICD-10-CM

## 2016-07-02 DIAGNOSIS — Y92099 Unspecified place in other non-institutional residence as the place of occurrence of the external cause: Secondary | ICD-10-CM | POA: Diagnosis not present

## 2016-07-02 DIAGNOSIS — M25552 Pain in left hip: Secondary | ICD-10-CM | POA: Diagnosis not present

## 2016-07-02 DIAGNOSIS — M25512 Pain in left shoulder: Secondary | ICD-10-CM

## 2016-07-02 DIAGNOSIS — W19XXXA Unspecified fall, initial encounter: Secondary | ICD-10-CM

## 2016-07-02 NOTE — Progress Notes (Signed)
   Subjective:    Patient ID: Patricia Villa, female    DOB: [birth year], 83 y.o.   MRN: [id number]  HPI Pt presents to the office today after falling this morning at 3 AM. Pt states she fell in between her nightstand and bed and landed on her left shoulder and left hip. Reports having pain mild soreness in her arm and shoulder.    Review of Systems  Musculoskeletal: Positive for arthralgias and myalgias.  Skin:       ecchymosis   All other systems reviewed and are negative.      Objective:   Physical Exam  Constitutional: She appears well-developed and well-nourished.  Cardiovascular: Normal heart sounds and intact distal pulses.  An irregular rhythm present.  Pulmonary/Chest: Effort normal and breath sounds normal. No respiratory distress. She has no wheezes.  Abdominal: Soft. Bowel sounds are normal. She exhibits no distension. There is no tenderness.  Musculoskeletal: Normal range of motion.  Full ROM left shoulder and left hip   Skin: Skin is warm and dry.  Scattered ecchymosis on left arm      BP 126/70   Pulse 74   Temp (!) 96.7 F (35.9 C) (Oral)   Ht 5\' 4"  (1.626 m)   Wt 137 lb 9.6 oz (62.4 kg)   LMP 08/01/1982   BMI 23.62 kg/m      Assessment & Plan:hip   1. Fall in home, initial encounter   2. Contusion of left upper arm, initial encounter  3. Acute pain of left shoulder  4. Pain of left hip joint  Fall prevention discussed Discussed avoiding fast changes in positions RTO prn    Evelina Dun, FNP

## 2016-07-02 NOTE — Patient Instructions (Signed)
Fall Prevention in the Home Falls can cause injuries and can affect people from all age groups. There are many simple things that you can do to make your home safe and to help prevent falls. What can I do on the outside of my home?  Regularly repair the edges of walkways and driveways and fix any cracks.  Remove high doorway thresholds.  Trim any shrubbery on the main path into your home.  Use bright outdoor lighting.  Clear walkways of debris and clutter, including tools and rocks.  Regularly check that handrails are securely fastened and in good repair. Both sides of any steps should have handrails.  Install guardrails along the edges of any raised decks or porches.  Have leaves, snow, and ice cleared regularly.  Use sand or salt on walkways during winter months.  In the garage, clean up any spills right away, including grease or oil spills. What can I do in the bathroom?  Use night lights.  Install grab bars by the toilet and in the tub and shower. Do not use towel bars as grab bars.  Use non-skid mats or decals on the floor of the tub or shower.  If you need to sit down while you are in the shower, use a plastic, non-slip stool.  Keep the floor dry. Immediately clean up any water that spills on the floor.  Remove soap buildup in the tub or shower on a regular basis.  Attach bath mats securely with double-sided non-slip rug tape.  Remove throw rugs and other tripping hazards from the floor. What can I do in the bedroom?  Use night lights.  Make sure that a bedside light is easy to reach.  Do not use oversized bedding that drapes onto the floor.  Have a firm chair that has side arms to use for getting dressed.  Remove throw rugs and other tripping hazards from the floor. What can I do in the kitchen?  Clean up any spills right away.  Avoid walking on wet floors.  Place frequently used items in easy-to-reach places.  If you need to reach for something above  you, use a sturdy step stool that has a grab bar.  Keep electrical cables out of the way.  Do not use floor polish or wax that makes floors slippery. If you have to use wax, make sure that it is non-skid floor wax.  Remove throw rugs and other tripping hazards from the floor. What can I do in the stairways?  Do not leave any items on the stairs.  Make sure that there are handrails on both sides of the stairs. Fix handrails that are broken or loose. Make sure that handrails are as long as the stairways.  Check any carpeting to make sure that it is firmly attached to the stairs. Fix any carpet that is loose or worn.  Avoid having throw rugs at the top or bottom of stairways, or secure the rugs with carpet tape to prevent them from moving.  Make sure that you have a light switch at the top of the stairs and the bottom of the stairs. If you do not have them, have them installed. What are some other fall prevention tips?  Wear closed-toe shoes that fit well and support your feet. Wear shoes that have rubber soles or low heels.  When you use a stepladder, make sure that it is completely opened and that the sides are firmly locked. Have someone hold the ladder while you are using   it. Do not climb a closed stepladder.  Add color or contrast paint or tape to grab bars and handrails in your home. Place contrasting color strips on the first and last steps.  Use mobility aids as needed, such as canes, walkers, scooters, and crutches.  Turn on lights if it is dark. Replace any light bulbs that burn out.  Set up furniture so that there are clear paths. Keep the furniture in the same spot.  Fix any uneven floor surfaces.  Choose a carpet design that does not hide the edge of steps of a stairway.  Be aware of any and all pets.  Review your medicines with your healthcare provider. Some medicines can cause dizziness or changes in blood pressure, which increase your risk of falling. Talk with  your health care provider about other ways that you can decrease your risk of falls. This may include working with a physical therapist or trainer to improve your strength, balance, and endurance. This information is not intended to replace advice given to you by your health care provider. Make sure you discuss any questions you have with your health care provider. Document Released: 04/12/2002 Document Revised: 09/19/2015 Document Reviewed: 05/27/2014 Elsevier Interactive Patient Education  2017 Elsevier Inc.  

## 2016-07-02 NOTE — Telephone Encounter (Signed)
appt scheduled

## 2016-07-05 ENCOUNTER — Encounter: Payer: Self-pay | Admitting: Emergency Medicine

## 2016-07-05 ENCOUNTER — Ambulatory Visit (INDEPENDENT_AMBULATORY_CARE_PROVIDER_SITE_OTHER): Payer: Medicare Other | Admitting: Emergency Medicine

## 2016-07-05 DIAGNOSIS — I2721 Secondary pulmonary arterial hypertension: Secondary | ICD-10-CM

## 2016-07-05 DIAGNOSIS — J3801 Paralysis of vocal cords and larynx, unilateral: Secondary | ICD-10-CM

## 2016-07-05 NOTE — Assessment & Plan Note (Signed)
Likely due to her mitral regurgitation given her prior right heart catheterization results. I do not see any other clear explanation - no underlying lung disease, no hx diet pills or auto-immune disease. Unfortunately there may have been pulm vascular remodeling due to the MR. If her PAH is due to this than it may not respond well to directed medical therapy. We will perform a walking oximetry today to assess for desats, insure that she still needs o2. May need to consider repeat r heart cath - certainly we would do this if we want to investigate directed PAH meds. I will discuss pros / cons w Dr Percival Spanish.

## 2016-07-05 NOTE — Assessment & Plan Note (Signed)
This is another contributor to dyspnea. Agree that injection may be beneficial. She is at increased risk for general anesthesia - explained this to her. I do not believe this precludes procedure.

## 2016-07-05 NOTE — Progress Notes (Signed)
Subjective:    Patient ID: Patricia Villa, female    DOB: 25-Oct-1932, 83 y.o.   MRN: [id number]  HPI 83 year-old never smoker with a history of mitral regurgitation, atrial fibrillation, and associated PAH on TTE  And R heart cath from 01/02/16 that shows  mPAP 34 and large v-wave consistent with her MR. She underwent mitral clipping in 10/17.  Then her f/u TTE 03/18/16 showed evidence for continued severe PAH. She was started on O2 at [facility] after the clipping was done. Her exertional tolerance is very poor. She gets fatigued and dyspneic with any walking.   No hx diet pills, no hx lupus, auto-immune disease.   Also vocal cord paralysis that was dx . She was seen by ENT 2/8 and was scheduled for injection in the OR, scheduled for this month.   She carries a possible hx aspiration based on a review of chart notes. She had a swallow eval at [facility], Dr Madden's office. Didn't have MBS.    Review of Systems  Constitutional: Negative.  Negative for fever and unexpected weight change.  HENT: Positive for trouble swallowing. Negative for congestion, dental problem, ear pain, nosebleeds, postnasal drip, rhinorrhea, sinus pressure, sneezing and sore throat.   Eyes: Positive for redness. Negative for itching.  Respiratory: Positive for cough and shortness of breath. Negative for chest tightness and wheezing.   Cardiovascular: Positive for leg swelling. Negative for palpitations.  Gastrointestinal: Negative.  Negative for nausea and vomiting.  Genitourinary: Negative.  Negative for dysuria.  Musculoskeletal: Negative.   Skin: Negative for rash.  Neurological: Negative.  Negative for headaches.  Hematological: Bruises/bleeds easily.  Psychiatric/Behavioral: Negative.  Negative for dysphoric mood. The patient is not nervous/anxious.     Past Medical History:  Diagnosis Date  . Allergy   . Alopecia   . Anxiety   . Arthritis   . Atrial fibrillation, persistent ([place]) 10/30/2015  . Cataract   .  DDD (degenerative disc disease), cervical   . Diverticulosis of colon   . Esotropia   . History of double vision   . Hyperlipidemia   . Hypertension   . Hypertrophic cardiomyopathy ([place])    by history but not identified on the most recent echo  . Meningioma (HCC)    cerebellopontine angle   . Mild mitral valve prolapse   . Mitral insufficiency    Severe with anterior flail leaflet.    . Osteoporosis   . Postmenopausal HRT (hormone replacement therapy)   . 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 Vertebrobasilar insufficiency   . Vocal cord paralysis, unilateral complete      Family History  Problem Relation Age of Onset  . Other Brother     IHSS  . COPD Brother   . Diabetes Brother   . Heart disease Brother   . Hyperlipidemia Brother   . Hypertension Brother   . Asthma Mother   . Other Sister     IHSS  . Other Sister     IHSS  . Cancer Cousin     thyroid     Social History   Social History  . Marital status: Married    Spouse name: N/A  . Number of children: N/A  . Years of education: N/A   Occupational History  . Not on file.   Social History Main Topics  . Smoking status: Never Smoker  . Smokeless tobacco: Never Used  Comment: Rare previously  . Alcohol use No  . Drug use: No  . Sexual activity: No   Other Topics Concern  . Not on file   Social History Narrative  . No narrative on file     Allergies  Allergen Reactions  . Diphenhydramine Hcl Hives  . Livalo [Pitavastatin Calcium] Other (See Comments)    Pt doesn't remember reaction  . Sulfa Antibiotics Other (See Comments)    Pt doesn't remember reaction  . Zocor [Simvastatin] Other (See Comments)    Pt doesn't remember reaction  . Actonel [Risedronate Sodium] Other (See Comments)    Esophagitis Esophagitis   . Fosamax [Alendronate Sodium] Other (See Comments)    Esophagitis  Esophagitis   . Pravastatin Other (See Comments)    Extreme leg  cramping. Extreme leg cramping.     Outpatient Medications Prior to Visit  Medication Sig Dispense Refill  . Cholecalciferol (VITAMIN D3) 2000 UNITS TABS Take 2,000 Units by mouth daily.    Marland Kitchen donepezil (ARICEPT) 10 MG tablet Take 1 tablet (10 mg total) by mouth at bedtime. 90 tablet 3  . DULoxetine (CYMBALTA) 30 MG capsule Take 2 capsules (60mg ) once daily (Patient taking differently: Take 2 capsules (60mg ) once daily) 60 capsule 0  . folic acid (FOLVITE) 1 MG tablet Take 1 mg by mouth daily.    . furosemide (LASIX) 40 MG tablet Take 20 mg by mouth daily. Take 1/2 tablet by mouth ( 20 mg ) daily 90 tablet 3  . metoprolol succinate (TOPROL-XL) 25 MG 24 hr tablet Take 0.5 tablets (12.5 mg total) by mouth daily. 45 tablet 3  . triamcinolone cream (KENALOG) 0.1 %     . warfarin (COUMADIN) 5 MG tablet Take 1 tablet (5 mg total) by mouth daily. 90 tablet 0   No facility-administered medications prior to visit.          Objective:   Physical Exam Vitals:   07/05/16 1127  BP: 140/72  Pulse: 70  SpO2: 93%  Weight: 139 lb 6.4 oz (63.2 kg)  Height: 5\' 4"  (1.626 m)   Gen: Pleasant, somewhat ill appearing, in no distress,  normal affect, in wheelchair/.   ENT: No lesions,  mouth clear,  oropharynx clear, no postnasal drip, hoarse voice  Neck: No JVD, no stridor, some secretions heard, trouble clearing them  Lungs: No use of accessory muscles, clear without rales or rhonchi  Cardiovascular: RRR, 2/6 M  Musculoskeletal: No deformities, no cyanosis or clubbing  Neuro: alert, non focal  Skin: Warm, no lesions or rashes       Assessment & Plan:  Secondary pulmonary arterial hypertension Likely due to her mitral regurgitation given her prior right heart catheterization results. I do not see any other clear explanation - no underlying lung disease, no hx diet pills or auto-immune disease. Unfortunately there may have been pulm vascular remodeling due to the MR. If her PAH is due to  this than it may not respond well to directed medical therapy. We will perform a walking oximetry today to assess for desats, insure that she still needs o2. May need to consider repeat r heart cath - certainly we would do this if we want to investigate directed PAH meds. I will discuss pros / cons w Dr Percival Spanish.   Vocal cord paralysis, unilateral complete This is another contributor to dyspnea. Agree that injection may be beneficial. She is at increased risk for general anesthesia - explained this to her. I do not believe this  precludes procedure.   Baltazar Apo, MD, PhD 07/05/2016, 12:05 PM [facility] [phone] or if no answer [phone]

## 2016-07-05 NOTE — Patient Instructions (Signed)
We will perform a walking oximetry today As discussed today, you are at increased risk to have a procedure and general anesthesia, but this does not preclude a surgery or procedure if the benefits outweight these risks.  There may be some benefit to repeating your right heart catherization in the future to measure your pulmonary blood pressure. Dr Lamonte Sakai will discuss this with Dr Percival Spanish.  Follow with Dr Lamonte Sakai in 1 month

## 2016-07-06 ENCOUNTER — Encounter: Payer: Self-pay | Admitting: Pediatrics

## 2016-07-06 ENCOUNTER — Ambulatory Visit (INDEPENDENT_AMBULATORY_CARE_PROVIDER_SITE_OTHER): Payer: Medicare Other | Admitting: Pediatrics

## 2016-07-06 VITALS — BP 121/70 | HR 80 | Temp 97.2°F | Ht 64.0 in | Wt 139.0 lb

## 2016-07-06 DIAGNOSIS — R296 Repeated falls: Secondary | ICD-10-CM | POA: Diagnosis not present

## 2016-07-06 DIAGNOSIS — K146 Glossodynia: Secondary | ICD-10-CM | POA: Diagnosis not present

## 2016-07-06 DIAGNOSIS — I10 Essential (primary) hypertension: Secondary | ICD-10-CM | POA: Diagnosis not present

## 2016-07-06 DIAGNOSIS — Z7901 Long term (current) use of anticoagulants: Secondary | ICD-10-CM

## 2016-07-06 DIAGNOSIS — I4821 Permanent atrial fibrillation: Secondary | ICD-10-CM

## 2016-07-06 DIAGNOSIS — I482 Chronic atrial fibrillation: Secondary | ICD-10-CM | POA: Diagnosis not present

## 2016-07-06 NOTE — Progress Notes (Signed)
  Subjective:   Patient ID: Patricia Villa, female    DOB: 08/23/1932, 83 y.o.   MRN: [id number] CC: Bleeding/Bruising (tongue last pm could not get it to stop) and Hip Pain (left fell on it at night going to bathroom, Monday night hurt worse since seeing Christy for the bruises she received on the arm)  HPI: Patricia Villa is a 83 y.o. female presenting for Bleeding/Bruising (tongue last pm could not get it to stop) and Hip Pain (left fell on it at night going to bathroom, Monday night hurt worse since seeing Christy for the bruises she received on the arm)  Bit her tongue last night while eating, bled for some time Has stopped since then  [place] two nights ago getting up at night to use bathroom Lives with husband, he cant hear when she gets up She doesn't use walker at night Son says he keeps encouraging her to turn on light when she gets up though he keesp finding flashlights in her bed Lives alone with husband Says she got up to use bathroom but instead of going forward felt herself start to go backward then landed on L side between bed and side table Some bruising on l leg Was able to stand up on her own after incident and could weight bear Not still feeling sore Has bruises on her arms On chronic anticoagulation for afib  Here today with her son [phone]  Relevant past medical, surgical, family and social history reviewed. Allergies and medications reviewed and updated. History  Smoking Status  . Never Smoker  Smokeless Tobacco  . Never Used    Comment: Rare previously   ROS: Per HPI   Objective:    BP 121/70   Pulse 80   Temp 97.2 F (36.2 C) (Oral)   Ht 5\' 4"  (1.626 m)   Wt 139 lb (63 kg)   LMP 08/01/1982   BMI 23.86 kg/m   Wt Readings from Last 3 Encounters:  07/06/16 139 lb (63 kg)  07/05/16 139 lb 6.4 oz (63.2 kg)  07/02/16 137 lb 9.6 oz (62.4 kg)    Gen: NAD, alert, cooperative with exam, NCAT EYES: EOMI, no conjunctival injection, or no  icterus ENT:  OP without erythema, small 63mm L side of tongue laceration, non-bleeding LYMPH: no cervical LAD CV: irregular, NR, normal S1/S2, no murmur Resp: CTABL, no wheezes, normal WOB Abd: +BS, soft, NTND Ext: No edema, warm Neuro: Alert and oriented MSK: no pain with ROM b/l hips with rotation straight legs external/internally Pelvis stable, no pain Legs equal length Skin: bruise L lateral mid thigh   Assessment & Plan:  Carre was seen today for bleeding/bruising and hip pain.  Diagnoses and all orders for this visit:  Essential hypertension, benign Well controlled On 12.5mg  daily metoprolol, 20mg  lasix daily Denies feeling lightheaded when standing  Chronic anticoagulation INR 2.5 today -     CoaguChek XS/INR Waived  Frequent falls Has not been using walker regularly, has been more since fall Doesn't keep next to her bed Will refer for gait training, balance training given frequent falls Low suspicion for hip or pelvis fracture with normal exam, no pain with weight bearing now -     Ambulatory referral to Physical Therapy  Permanent atrial fibrillation ([place]) Cont warfarin as above  Tongue sore Recently bite tongue, appears well healing  Follow up plan: 2 mo, sooner if needed Patricia Found, MD [place]

## 2016-07-08 ENCOUNTER — Telehealth: Payer: Self-pay | Admitting: Family Medicine

## 2016-07-08 LAB — COAGUCHEK XS/INR WAIVED
INR: 2.5 — ABNORMAL HIGH (ref 0.9–1.1)
PROTHROMBIN TIME: 29.8 s

## 2016-07-08 NOTE — Telephone Encounter (Signed)
Pt called

## 2016-07-22 DIAGNOSIS — R300 Dysuria: Secondary | ICD-10-CM | POA: Diagnosis not present

## 2016-07-22 DIAGNOSIS — Z01818 Encounter for other preprocedural examination: Secondary | ICD-10-CM | POA: Diagnosis not present

## 2016-07-22 DIAGNOSIS — R531 Weakness: Secondary | ICD-10-CM | POA: Diagnosis not present

## 2016-07-22 DIAGNOSIS — R404 Transient alteration of awareness: Secondary | ICD-10-CM | POA: Diagnosis not present

## 2016-07-22 DIAGNOSIS — I4891 Unspecified atrial fibrillation: Secondary | ICD-10-CM | POA: Diagnosis not present

## 2016-07-23 ENCOUNTER — Telehealth: Payer: Self-pay | Admitting: Emergency Medicine

## 2016-07-23 ENCOUNTER — Telehealth: Payer: Self-pay | Admitting: Family Medicine

## 2016-07-23 NOTE — Progress Notes (Signed)
fatigu   HPI The patient presents for followup of MR and atrial fib.  She had MR with severe a partially flail anterior leaflet with moderate to severe eccentric MR.   There was also moderately severe TR with elevated pulmonary pressures.    She had TEE which confirmed ruptured chords with severe flail motion of the anterior leaflet.  She was seen by Dr. Burt Knack and Dr. Roxy Manns.  She had an extensive discussion to dedice on the best course of therapy for her MV.  Left and right heart catheterization was notable for moderate coronary artery disease with 50% stenosis of the mid left anterior descending coronary artery and otherwise nonobstructive disease.  There was moderate pulmonary hypertension with large V waves on wedge tracing consistent with severe mitral regurgitation. Cardiac output was relatively low with mixed venous oxygen saturation only 47%, cardiac output 3.0 L/m corresponding to cardiac index of 1.8.   She was subsequently sent to [facility]. Bridgette Habermann at [facility].   She had mitral valve repair using two mitral clips.  Echo done at [facility] in Nov demonstrated an EF 70% with moderate concentric hypertrophy of the LV.  She had mild MR.  There was moderate TR.  Peak pulmonary pressure was elevated at 65.  There was no change from prior.  Since I last saw her she saw Dr. Lamonte Sakai (see below).    She continues to have multiple complaints. She's had a couple of falls but she's not using her walker. She is short of breath today but she is not wearing her oxygen which is in the car. She continues to have problems with acid reflux and her hoarseness. She's had increased confusion. She's not taking her diuretic because it makes her urinate too much. Her family says she's hiding pills. She's had some vivid dreams. Smells are bothering her and she is getting nauseated with her family is cooking. She's not having any new PND or orthopnea. She's not having any new chest pressure, neck or arm discomfort.   Allergies  Allergen  Reactions  . Diphenhydramine Hcl Hives  . Livalo [Pitavastatin Calcium] Other (See Comments)    Pt doesn't remember reaction  . Sulfa Antibiotics Other (See Comments)    Pt doesn't remember reaction  . Zocor [Simvastatin] Other (See Comments)    Pt doesn't remember reaction  . Actonel [Risedronate Sodium] Other (See Comments)    Esophagitis Esophagitis   . Fosamax [Alendronate Sodium] Other (See Comments)    Esophagitis  Esophagitis   . Pravastatin Other (See Comments)    Extreme leg cramping. Extreme leg cramping.    Current Outpatient Prescriptions  Medication Sig Dispense Refill  . Cholecalciferol (VITAMIN D3) 2000 UNITS TABS Take 2,000 Units by mouth daily.    Marland Kitchen donepezil (ARICEPT) 10 MG tablet Take 1 tablet (10 mg total) by mouth at bedtime. 90 tablet 3  . DULoxetine (CYMBALTA) 30 MG capsule Take 30 mg by mouth daily.    . folic acid (FOLVITE) 1 MG tablet Take 1 mg by mouth daily.    . furosemide (LASIX) 40 MG tablet Take 1/2 tablet by mouth ( 20 mg ) daily 90 tablet 3  . metoprolol succinate (TOPROL-XL) 25 MG 24 hr tablet Take 0.5 tablets (12.5 mg total) by mouth daily. 45 tablet 3  . warfarin (COUMADIN) 5 MG tablet Take 1 tablet (5 mg total) by mouth daily. 90 tablet 0   No current facility-administered medications for this visit.     Past Medical History:  Diagnosis Date  .  Allergy   . Alopecia   . Anxiety   . Arthritis   . Atrial fibrillation, persistent ([place]) 10/30/2015  . Cataract   . DDD (degenerative disc disease), cervical   . Diverticulosis of colon   . Esotropia   . History of double vision   . Hyperlipidemia   . Hypertension   . Hypertrophic cardiomyopathy ([place])    by history but not identified on the most recent echo  . Meningioma (HCC)    cerebellopontine angle   . Mild mitral valve prolapse   . Mitral insufficiency    Severe with anterior flail leaflet.    . Osteoporosis   . Postmenopausal HRT (hormone replacement therapy)   . Stroke due to  embolism of left cerebellar artery (HCC)    Silent - noted on MRI  . Vertebrobasilar insufficiency   . Vocal cord paralysis, unilateral complete     Past Surgical History:  Procedure Laterality Date  . ABDOMINAL HYSTERECTOMY    . CARDIAC CATHETERIZATION N/A 01/02/2016   Procedure: Right/Left Heart Cath and Coronary Angiography;  Surgeon: Sherren Mocha, MD;  Location: [place] CV LAB;  Service: Cardiovascular;  Laterality: N/A;  . CATARACT EXTRACTION, BILATERAL    . EYE SURGERY    . TEE WITHOUT CARDIOVERSION N/A 12/12/2015   Procedure: TRANSESOPHAGEAL ECHOCARDIOGRAM (TEE);  Surgeon: Lelon Perla, MD;  Location: [facility] ENDOSCOPY;  Service: Cardiovascular;  Laterality: N/A;  . TONSILLECTOMY    . VEIN SURGERY      ROS:     As stated in the HPI and negative for all other systems.  PHYSICAL EXAM BP 124/64   Pulse 86   Ht 5\' 4"  (1.626 m)   Wt 144 lb (65.3 kg)   LMP 08/01/1982   BMI 24.72 kg/m  GENERAL:  Frail appearing NECK:  Positive jugular venous distention, waveform within normal limits, carotid upstroke brisk and symmetric, no bruits, no thyromegaly LUNGS:  Clear to auscultation bilaterally CHEST:  Unremarkable HEART:  PMI not displaced or sustained,S1 and S2 within normal limits, no S3,  no clicks, no rubs,  2 out of 6 apical holosystolic murmur, no diastolic murmurs, irregular ABD:  Flat, positive bowel sounds normal in frequency in pitch, no bruits, no rebound, no guarding, no midline pulsatile mass, no hepatomegaly, no splenomegaly, umbilical hernia.  EXT:  2 plus pulses throughout, mild right leg edema, no cyanosis no clubbing,  SKIN:   Sores, scratch marks on the back  EKG:  Atrial fibrillation, rate 86, axis within normal limits, intervals within normal limits, no acute ST-T changes, nonspecific diffuse T wave flattening.   Lab Results  Component Value Date   TSH 0.780 12/20/2015   Lab Results  Component Value Date   WBC 8.0 04/02/2016   HGB 11.4 (L) 01/02/2016    HCT 35.5 04/02/2016   MCV 92 04/02/2016   PLT 264 04/02/2016    ASSESSMENT AND PLAN  ATRIAL FIB:  The patient tolerates this. She's tolerating anticoagulation. She has reasonable rate control. She will continue with rate control and anticoagulation.  Patricia Villa has a CHA2DS2 - VASc score of 4 with a risk of stroke of 4%.  She could hold her anticoagulation as needed for any upcoming procedure.  MITRAL INSUFFICIENCY:   This was followed up at [facility] with results as above.  She is to follow-up with them in Nov  HTN:  The blood pressure is controlled and she'll continue the meds as listed.   HOARSENESS:  She is still  being considered for the procedure at [facility]. I don't plan any cardiac testing such as right heart catheterization prior to this. I don't think this will lead to change her therapy. We did discuss her reflux complaints and precautions to try to lessen this at night. I do note that she had a hemoglobin of 9.7 recently and white blood count of 14.6 and I will defer to her other providers management of this.  PULMONARY HTN:   She saw Dr.Byrum since I last saw her and I reviewed these records. He thought that her PAH was likely secondary to her mitral valve disease and might not respond to vasoactive therapy. He said he would consider possible repeat right heart cath and we would discuss this further. For now he wanted to continue her oxygen. I don't plan on repeat right heart catheterization as it is unlikely to change therapy or outcome.  CONFUSION:  She has an upcoming appointment with Dr. Laurance Flatten and I discussed with her family the need to discuss this with him. She may need some change in her medications such as her Cymbalta.

## 2016-07-23 NOTE — Telephone Encounter (Signed)
RB please call below number to speak with Lacie Draft as you're available to.  Thank you.

## 2016-07-23 NOTE — Telephone Encounter (Signed)
Please make sure that we speak with the PA at [facility] at earliest convenience regarding [name]

## 2016-07-23 NOTE — Telephone Encounter (Signed)
Attempted to reach Patricia Villa to verify best call back number and that we can page Dr. Lamonte Sakai and provide his pager number for her as well if she likes. Unable to leave a vm

## 2016-07-24 ENCOUNTER — Ambulatory Visit (INDEPENDENT_AMBULATORY_CARE_PROVIDER_SITE_OTHER): Payer: Medicare Other | Admitting: Cardiology

## 2016-07-24 ENCOUNTER — Other Ambulatory Visit: Payer: Self-pay | Admitting: *Deleted

## 2016-07-24 VITALS — BP 124/64 | HR 86 | Ht 64.0 in | Wt 144.0 lb

## 2016-07-24 DIAGNOSIS — Z79899 Other long term (current) drug therapy: Secondary | ICD-10-CM | POA: Diagnosis not present

## 2016-07-24 DIAGNOSIS — I480 Paroxysmal atrial fibrillation: Secondary | ICD-10-CM | POA: Diagnosis not present

## 2016-07-24 DIAGNOSIS — R0602 Shortness of breath: Secondary | ICD-10-CM

## 2016-07-24 DIAGNOSIS — I10 Essential (primary) hypertension: Secondary | ICD-10-CM | POA: Diagnosis not present

## 2016-07-24 DIAGNOSIS — I272 Pulmonary hypertension, unspecified: Secondary | ICD-10-CM | POA: Diagnosis not present

## 2016-07-24 DIAGNOSIS — I080 Rheumatic disorders of both mitral and aortic valves: Secondary | ICD-10-CM

## 2016-07-24 DIAGNOSIS — R710 Precipitous drop in hematocrit: Secondary | ICD-10-CM

## 2016-07-24 NOTE — Telephone Encounter (Signed)
Will call.

## 2016-07-24 NOTE — Telephone Encounter (Signed)
RB is scheduled off this PM  Called the number below but it was to a [employer] to let Redbird Smith know that RB is off this afternoon Called the original number in the message but was told by automated message that "The party you are trying to reach is not available.  Please try your call again later."   ** THE NUMBER BELOW IS INCORRECT !!  DO NOT CALL [phone] **  Holding in triage since we do not have a number we know works for [employer] PA

## 2016-07-24 NOTE — Telephone Encounter (Signed)
I was able to speak with her regarding the patient's status, risk for possible anesthesia and elective surgery

## 2016-07-24 NOTE — Patient Instructions (Signed)
Medication Instructions:  The current medical regimen is effective;  continue present plan and medications.  Follow-Up: Follow up in 4 months with Dr. Percival Spanish.  You will receive a letter in the mail 2 months before you are due.  Please call us when you receive this letter to schedule your follow up appointment.  If you need a refill on your cardiac medications before your next appointment, please call your pharmacy.  Thank you for choosing [facility]!!    You have been cleared for surgery from a cardiac standpoint.

## 2016-07-24 NOTE — Telephone Encounter (Signed)
Patricia Villa, requesting to speak with Dr. Lamonte Sakai. [name] can be reached on her cell phone [phone].

## 2016-07-25 ENCOUNTER — Encounter: Payer: Self-pay | Admitting: Cardiology

## 2016-07-25 DIAGNOSIS — Z961 Presence of intraocular lens: Secondary | ICD-10-CM | POA: Diagnosis not present

## 2016-07-25 DIAGNOSIS — H524 Presbyopia: Secondary | ICD-10-CM | POA: Diagnosis not present

## 2016-07-25 DIAGNOSIS — H1132 Conjunctival hemorrhage, left eye: Secondary | ICD-10-CM | POA: Diagnosis not present

## 2016-07-26 ENCOUNTER — Ambulatory Visit (INDEPENDENT_AMBULATORY_CARE_PROVIDER_SITE_OTHER): Payer: Medicare Other | Admitting: Pharmacist

## 2016-07-26 DIAGNOSIS — I480 Paroxysmal atrial fibrillation: Secondary | ICD-10-CM | POA: Diagnosis not present

## 2016-07-26 DIAGNOSIS — R710 Precipitous drop in hematocrit: Secondary | ICD-10-CM | POA: Diagnosis not present

## 2016-07-26 DIAGNOSIS — I481 Persistent atrial fibrillation: Secondary | ICD-10-CM | POA: Diagnosis not present

## 2016-07-26 DIAGNOSIS — I4819 Other persistent atrial fibrillation: Secondary | ICD-10-CM

## 2016-07-26 LAB — CBC WITH DIFFERENTIAL/PLATELET
BASOS ABS: 0.1 10*3/uL (ref 0.0–0.2)
Basos: 1 %
EOS (ABSOLUTE): 0.1 10*3/uL (ref 0.0–0.4)
EOS: 2 %
HEMATOCRIT: 32.1 % — AB (ref 34.0–46.6)
Hemoglobin: 9.7 g/dL — ABNORMAL LOW (ref 11.1–15.9)
Immature Grans (Abs): 0 10*3/uL (ref 0.0–0.1)
Immature Granulocytes: 0 %
Lymphocytes Absolute: 1.2 10*3/uL (ref 0.7–3.1)
Lymphs: 23 %
MCH: 25.7 pg — ABNORMAL LOW (ref 26.6–33.0)
MCHC: 30.2 g/dL — ABNORMAL LOW (ref 31.5–35.7)
MCV: 85 fL (ref 79–97)
MONOCYTES: 15 %
MONOS ABS: 0.8 10*3/uL (ref 0.1–0.9)
NEUTROS PCT: 59 %
Neutrophils Absolute: 3.1 10*3/uL (ref 1.4–7.0)
Platelets: 275 10*3/uL (ref 150–379)
RBC: 3.78 x10E6/uL (ref 3.77–5.28)
RDW: 18.3 % — ABNORMAL HIGH (ref 12.3–15.4)
WBC: 5.3 10*3/uL (ref 3.4–10.8)

## 2016-07-26 LAB — BMP8+EGFR
BUN/Creatinine Ratio: 34 — ABNORMAL HIGH (ref 12–28)
BUN: 24 mg/dL (ref 8–27)
CALCIUM: 9.1 mg/dL (ref 8.7–10.3)
CO2: 25 mmol/L (ref 18–29)
Chloride: 98 mmol/L (ref 96–106)
Creatinine, Ser: 0.71 mg/dL (ref 0.57–1.00)
GFR calc Af Amer: 92 mL/min/{1.73_m2} (ref >59)
GFR, EST NON AFRICAN AMERICAN: 80 mL/min/{1.73_m2} (ref >59)
GLUCOSE: 91 mg/dL (ref 65–99)
POTASSIUM: 4 mmol/L (ref 3.5–5.2)
Sodium: 137 mmol/L (ref 134–144)

## 2016-07-26 LAB — COAGUCHEK XS/INR WAIVED
INR: 2.4 — ABNORMAL HIGH (ref 0.9–1.1)
PROTHROMBIN TIME: 29 s

## 2016-07-26 NOTE — Patient Instructions (Signed)
Anticoagulation Dose Instructions as of 07/26/2016      Dorene Grebe Tue Wed Thu Fri Sat   New Dose 5 mg 5 mg 5 mg 5 mg 5 mg 5 mg 5 mg    Description   Continue to take warfarin 5mg  dose of 1 tablet once a day.  INR today:  2.4 (goal: 2-3)

## 2016-07-29 ENCOUNTER — Other Ambulatory Visit (INDEPENDENT_AMBULATORY_CARE_PROVIDER_SITE_OTHER): Payer: Medicare Other

## 2016-07-29 ENCOUNTER — Telehealth: Payer: Self-pay | Admitting: *Deleted

## 2016-07-29 DIAGNOSIS — R710 Precipitous drop in hematocrit: Secondary | ICD-10-CM

## 2016-07-29 NOTE — Telephone Encounter (Signed)
-----   Message from Eustaquio Maize, MD sent at 07/08/2016  8:51 PM EST ----- Regarding: can you call son? A pt I saw on Saturday.  Can you call her son Abbe Amsterdam at [phone] and let him know I ordered the physical therapy for gait training/stability training. I talked with GIna, she said he would have to ask the physical therapist about medicare coverage once they call to schedule. She also has cardiac rehab scheduled for end of March, when scheduling PT they might be able to answer if/how insurance coverage that will be affected.  Thank you!

## 2016-07-29 NOTE — Telephone Encounter (Signed)
Patient's son aware.  Patient's son will call cardiac rehab today to make sure medicare will cover.

## 2016-07-30 ENCOUNTER — Ambulatory Visit: Payer: Medicare Other | Admitting: Physical Therapy

## 2016-07-30 LAB — FECAL OCCULT BLOOD, IMMUNOCHEMICAL: Fecal Occult Bld: NEGATIVE

## 2016-08-01 ENCOUNTER — Encounter (HOSPITAL_COMMUNITY): Admission: RE | Admit: 2016-08-01 | Payer: Medicare Other | Source: Ambulatory Visit

## 2016-08-02 ENCOUNTER — Encounter (HOSPITAL_COMMUNITY): Payer: Self-pay | Admitting: Emergency Medicine

## 2016-08-02 ENCOUNTER — Emergency Department (HOSPITAL_COMMUNITY)
Admission: EM | Admit: 2016-08-02 | Discharge: 2016-08-02 | Disposition: A | Discharge status: Home / Self Care | Payer: Medicare Other | Attending: Emergency Medicine | Admitting: Emergency Medicine

## 2016-08-02 ENCOUNTER — Emergency Department (HOSPITAL_COMMUNITY): Payer: Medicare Other

## 2016-08-02 DIAGNOSIS — Z8673 Personal history of transient ischemic attack (TIA), and cerebral infarction without residual deficits: Secondary | ICD-10-CM | POA: Insufficient documentation

## 2016-08-02 DIAGNOSIS — I10 Essential (primary) hypertension: Secondary | ICD-10-CM | POA: Diagnosis not present

## 2016-08-02 DIAGNOSIS — M7989 Other specified soft tissue disorders: Secondary | ICD-10-CM | POA: Diagnosis not present

## 2016-08-02 DIAGNOSIS — Z7901 Long term (current) use of anticoagulants: Secondary | ICD-10-CM | POA: Diagnosis not present

## 2016-08-02 DIAGNOSIS — R531 Weakness: Secondary | ICD-10-CM | POA: Diagnosis not present

## 2016-08-02 DIAGNOSIS — M79605 Pain in left leg: Secondary | ICD-10-CM | POA: Diagnosis not present

## 2016-08-02 LAB — CBC WITH DIFFERENTIAL/PLATELET
BASOS ABS: 0 10*3/uL (ref 0.0–0.1)
Basophils Relative: 0 %
Eosinophils Absolute: 0.1 10*3/uL (ref 0.0–0.7)
Eosinophils Relative: 2 %
HEMATOCRIT: 30.6 % — AB (ref 36.0–46.0)
Hemoglobin: 9.4 g/dL — ABNORMAL LOW (ref 12.0–15.0)
LYMPHS PCT: 23 %
Lymphs Abs: 1.3 10*3/uL (ref 0.7–4.0)
MCH: 25.1 pg — ABNORMAL LOW (ref 26.0–34.0)
MCHC: 30.7 g/dL (ref 30.0–36.0)
MCV: 81.8 fL (ref 78.0–100.0)
Monocytes Absolute: 0.6 10*3/uL (ref 0.1–1.0)
Monocytes Relative: 11 %
NEUTROS ABS: 3.6 10*3/uL (ref 1.7–7.7)
NEUTROS PCT: 64 %
PLATELETS: 246 10*3/uL (ref 150–400)
RBC: 3.74 MIL/uL — AB (ref 3.87–5.11)
RDW: 18.6 % — ABNORMAL HIGH (ref 11.5–15.5)
WBC: 5.7 10*3/uL (ref 4.0–10.5)

## 2016-08-02 LAB — COMPREHENSIVE METABOLIC PANEL
ALT: 27 U/L (ref 14–54)
ANION GAP: 8 (ref 5–15)
AST: 45 U/L — ABNORMAL HIGH (ref 15–41)
Albumin: 3.3 g/dL — ABNORMAL LOW (ref 3.5–5.0)
Alkaline Phosphatase: 66 U/L (ref 38–126)
BILIRUBIN TOTAL: 1.7 mg/dL — AB (ref 0.3–1.2)
BUN: 23 mg/dL — ABNORMAL HIGH (ref 6–20)
CO2: 26 mmol/L (ref 22–32)
Calcium: 8.9 mg/dL (ref 8.9–10.3)
Chloride: 99 mmol/L — ABNORMAL LOW (ref 101–111)
Creatinine, Ser: 0.76 mg/dL (ref 0.44–1.00)
GFR calc Af Amer: >60 mL/min (ref >60)
Glucose, Bld: 96 mg/dL (ref 65–99)
POTASSIUM: 3.8 mmol/L (ref 3.5–5.1)
Sodium: 133 mmol/L — ABNORMAL LOW (ref 135–145)
TOTAL PROTEIN: 7.4 g/dL (ref 6.5–8.1)

## 2016-08-02 LAB — URINALYSIS, ROUTINE W REFLEX MICROSCOPIC
Bacteria, UA: NONE SEEN
Bilirubin Urine: NEGATIVE
GLUCOSE, UA: NEGATIVE mg/dL
HGB URINE DIPSTICK: NEGATIVE
Ketones, ur: NEGATIVE mg/dL
Leukocytes, UA: NEGATIVE
NITRITE: NEGATIVE
PROTEIN: 30 mg/dL — AB
Specific Gravity, Urine: 1.02 (ref 1.005–1.030)
Squamous Epithelial / LPF: NONE SEEN
pH: 5 (ref 5.0–8.0)

## 2016-08-02 LAB — PROTIME-INR
INR: 2.73
Prothrombin Time: 29.5 seconds — ABNORMAL HIGH (ref 11.4–15.2)

## 2016-08-02 MED ORDER — CEPHALEXIN 500 MG PO CAPS: 500.0000 mg | ORAL_CAPSULE | Freq: Four times a day (QID) | ORAL | 0 refills | Status: DC

## 2016-08-02 NOTE — ED Triage Notes (Addendum)
Pt complaint of worsening left leg swelling even post lasix increased. Redness, swelling, and warmth noted to left leg with triage.

## 2016-08-02 NOTE — ED Provider Notes (Signed)
[facility] DEPT Provider Note   CSN: [id number] Arrival date & time: 08/02/16  1624     History   Chief Complaint Chief Complaint  Patient presents with  . Leg Swelling    HPI Patricia Villa is a 83 y.o. female.  HPI  Pt comes in with cc of leg swelling. Pt has hx of AF on coumadin and also CHF. Pt has hx of strokes as well. PT has had increased swelling in the L leg over the past few days. Pt has been taking increased lasix, and the swelling is responding, but the symptoms are not resolving so she came to the ER. PT has redness to the leg and some pain when she tried to walk. Pt has been taking her coumadin as prescribed. Pt has no n/v/f/c. Pt lives with her husband, and her son and daughter in law are at bedside and very supportive.  Past Medical History:  Diagnosis Date  . Allergy   . Alopecia   . Anxiety   . Arthritis   . Atrial fibrillation, persistent ([place]) 10/30/2015  . Cataract   . DDD (degenerative disc disease), cervical   . Diverticulosis of colon   . Esotropia   . History of double vision   . Hyperlipidemia   . Hypertension   . Hypertrophic cardiomyopathy ([place])    by history but not identified on the most recent echo  . Meningioma (HCC)    cerebellopontine angle   . Mild mitral valve prolapse   . Mitral insufficiency    Severe with anterior flail leaflet.    . Osteoporosis   . Postmenopausal HRT (hormone replacement therapy)   . 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 Vertebrobasilar insufficiency   . Vocal cord paralysis, unilateral complete     Patient Active Problem List   Diagnosis Date Noted  . Secondary pulmonary arterial hypertension 07/05/2016  . Dysuria 06/12/2016  . History of cystocele 06/12/2016  . Vocal cord paralysis, unilateral complete   . Vertebrobasilar insufficiency   . Stroke due to embolism of left cerebellar artery ([place])   . Aortic atherosclerosis ([place]) 11/20/2015  . Atrial  fibrillation, persistent ([place]) 10/30/2015  . Atrial fibrillation ([place]) 11/23/2014  . Osteoporosis 08/31/2014  . Chronic neck pain 06/07/2014  . DDD (degenerative disc disease), cervical 01/24/2014  . Hyperlipidemia 05/17/2013  . Colitis 01/20/2013  . Generalized anxiety disorder 08/05/2012  . Meningioma ([place]) 08/05/2012  . Bruit 10/17/2010  . Essential hypertension, benign 10/04/2009  . MITRAL INSUFFICIENCY 09/26/2009  . Mitral valve disorder 09/26/2009  . Hypertrophic obstructive cardiomyopathy ([place]) 09/26/2009    Past Surgical History:  Procedure Laterality Date  . ABDOMINAL HYSTERECTOMY    . CARDIAC CATHETERIZATION N/A 01/02/2016   Procedure: Right/Left Heart Cath and Coronary Angiography;  Surgeon: Sherren Mocha, MD;  Location: [facility];  Service: Cardiovascular;  Laterality: N/A;  . CATARACT EXTRACTION, BILATERAL    . EYE SURGERY    . TEE WITHOUT CARDIOVERSION N/A 12/12/2015   Procedure: TRANSESOPHAGEAL ECHOCARDIOGRAM (TEE);  Surgeon: Lelon Perla, MD;  Location: [facility] ENDOSCOPY;  Service: Cardiovascular;  Laterality: N/A;  . TONSILLECTOMY    . VEIN SURGERY      OB History    No data available       Home Medications    Prior to Admission medications   Medication Sig Start Date End Date Taking? Authorizing Provider  Cholecalciferol (VITAMIN D3) 2000 UNITS TABS Take  2,000 Units by mouth daily.   Yes Historical Provider, MD  donepezil (ARICEPT) 10 MG tablet Take 1 tablet (10 mg total) by mouth at bedtime. Patient taking differently: Take 10 mg by mouth daily.  06/18/16  Yes Garvin Fila, MD  DULoxetine (CYMBALTA) 30 MG capsule Take 30 mg by mouth daily.   Yes Historical Provider, MD  folic acid (FOLVITE) 1 MG tablet Take 1 mg by mouth daily.   Yes Historical Provider, MD  furosemide (LASIX) 20 MG tablet Take 20 mg by mouth 2 (two) times daily. M, W, F- TAKES BID, ALL OTHER DAYS TAKES 20MG    CHANGED Friday   Yes Historical Provider, MD  metoprolol succinate  (TOPROL-XL) 25 MG 24 hr tablet Take 0.5 tablets (12.5 mg total) by mouth daily. 11/20/15  Yes Chipper Herb, MD  warfarin (COUMADIN) 5 MG tablet Take 1 tablet (5 mg total) by mouth daily. 06/26/16  Yes Chipper Herb, MD  cephALEXin (KEFLEX) 500 MG capsule Take 1 capsule (500 mg total) by mouth 4 (four) times daily. 08/02/16   Varney Biles, MD    Family History Family History  Problem Relation Age of Onset  . Other Brother     IHSS  . COPD Brother   . Diabetes Brother   . Heart disease Brother   . Hyperlipidemia Brother   . Hypertension Brother   . Asthma Mother   . Other Sister     IHSS  . Other Sister     IHSS  . Cancer Cousin     thyroid    Social History Social History  Substance Use Topics  . Smoking status: Never Smoker  . Smokeless tobacco: Never Used     Comment: Rare previously  . Alcohol use No     Allergies   Diphenhydramine hcl; Livalo [pitavastatin calcium]; Sulfa antibiotics; Zocor [simvastatin]; Actonel [risedronate sodium]; Fosamax [alendronate sodium]; and Pravastatin   Review of Systems Review of Systems  ROS 10 Systems reviewed and are negative for acute change except as noted in the HPI.     Physical Exam Updated Vital Signs BP (!) 139/119 (BP Location: Left Arm)   Pulse 96   Temp 98.4 F (36.9 C) (Oral)   Resp 18   Ht 5\' 4"  (1.626 m)   Wt 144 lb (65.3 kg)   LMP 08/01/1982   SpO2 100%   BMI 24.72 kg/m   Physical Exam  Constitutional: She is oriented to person, place, and time. She appears well-developed.  HENT:  Head: Normocephalic and atraumatic.  Eyes: EOM are normal.  Neck: Normal range of motion. Neck supple.  Cardiovascular: Normal rate.   Pulmonary/Chest: Effort normal.  Abdominal: Bowel sounds are normal.  Musculoskeletal: She exhibits edema, tenderness and deformity.  LLE unilateral swelling with warmth, redness and tenderness to palpation. 2+ pitting edema  Neurological: She is alert and oriented to person, place,  and time.  Skin: Skin is warm and dry.  Nursing note and vitals reviewed.    ED Treatments / Results  Labs (all labs ordered are listed, but only abnormal results are displayed) Labs Reviewed  COMPREHENSIVE METABOLIC PANEL - Abnormal; Notable for the following:       Result Value   Sodium 133 (*)    Chloride 99 (*)    BUN 23 (*)    Albumin 3.3 (*)    AST 45 (*)    Total Bilirubin 1.7 (*)    All other components within normal limits  CBC WITH DIFFERENTIAL/PLATELET -  Abnormal; Notable for the following:    RBC 3.74 (*)    Hemoglobin 9.4 (*)    HCT 30.6 (*)    MCH 25.1 (*)    RDW 18.6 (*)    All other components within normal limits  PROTIME-INR - Abnormal; Notable for the following:    Prothrombin Time 29.5 (*)    All other components within normal limits  URINALYSIS, ROUTINE W REFLEX MICROSCOPIC    EKG  EKG Interpretation None       Radiology Dg Chest 2 View  Result Date: 08/02/2016 CLINICAL DATA:  Weakness and leg swelling EXAM: CHEST  2 VIEW COMPARISON:  CT chest 01/22/2016 FINDINGS: Moderate left pleural effusion. Small right pleural effusion. No pneumothorax. Mild left basilar airspace disease likely reflecting atelectasis. Stable cardiomediastinal silhouette. No acute osseous abnormality. IMPRESSION: 1. Moderate left pleural effusion. 2. Small right pleural effusion. Electronically Signed   By: Kathreen Devoid   On: 08/02/2016 16:56   Dg Tibia/fibula Left  Result Date: 08/02/2016 CLINICAL DATA:  Worsening left leg pain and swelling. EXAM: LEFT TIBIA AND FIBULA - 2 VIEW COMPARISON:  None. FINDINGS: There is no evidence of fracture or other focal bone lesions. Diffuse soft tissue swelling noted. IMPRESSION: Soft tissue swelling. Electronically Signed   By: Kerby Moors M.D.   On: 08/02/2016 21:13    Procedures Procedures (including critical care time)  Medications Ordered in ED Medications - No data to display   Initial Impression / Assessment and Plan / ED  Course  I have reviewed the triage vital signs and the nursing notes.  Pertinent labs & imaging results that were available during my care of the patient were reviewed by me and considered in my medical decision making (see chart for details).     Pt comes in with cc of leg swelling.  DDx: DVT Cellulitis Osteomyelitis Superficial thrombophlebitis Osteomyelitis Chronic venous stasis / dermatitis  Pt is on coumadin, so pre-test probability for DVT is low. WE will still need to get a DVT r/o study.  If DVT is neg, then the chances are that pt has chronic venous stasis dermatiits vs. Cellulitis. We will give a prescription for keflex, that patient will take if the DVT study is neg. Pt has been advised to see pcp in 1 week. Strict ER return precautions have been discussed, and patient is agreeing with the plan and is comfortable with the workup done and the recommendations from the ER.   Final Clinical Impressions(s) / ED Diagnoses   Final diagnoses:  Leg swelling  Left leg swelling    New Prescriptions New Prescriptions   CEPHALEXIN (KEFLEX) 500 MG CAPSULE    Take 1 capsule (500 mg total) by mouth 4 (four) times daily.     Varney Biles, MD 08/02/16 2215

## 2016-08-02 NOTE — Discharge Instructions (Signed)
Expect a call for DVT study tomorrow. Take the antibiotics if the DVT study is normal and see your primary doctor in 1 week. Wear compression stockings everyday.  If there is a blood clot-  come to the ER.

## 2016-08-03 ENCOUNTER — Ambulatory Visit (HOSPITAL_COMMUNITY)
Admission: RE | Admit: 2016-08-03 | Discharge: 2016-08-03 | Disposition: A | Discharge status: Home / Self Care | Payer: Medicare Other | Source: Ambulatory Visit | Attending: Emergency Medicine | Admitting: Emergency Medicine

## 2016-08-03 ENCOUNTER — Other Ambulatory Visit (HOSPITAL_COMMUNITY): Payer: Self-pay | Admitting: Emergency Medicine

## 2016-08-03 DIAGNOSIS — R609 Edema, unspecified: Secondary | ICD-10-CM | POA: Insufficient documentation

## 2016-08-03 NOTE — Progress Notes (Signed)
VASCULAR LAB PRELIMINARY  PRELIMINARY  PRELIMINARY  PRELIMINARY  Left lower extremity venous duplex completed.    Preliminary report:  There is no DVT or SVT noted in the left lower extremity.   Tyquasia Pant, RVT 08/03/2016, 11:58 AM

## 2016-08-06 ENCOUNTER — Ambulatory Visit (INDEPENDENT_AMBULATORY_CARE_PROVIDER_SITE_OTHER): Payer: Medicare Other | Admitting: Family Medicine

## 2016-08-06 ENCOUNTER — Encounter: Payer: Self-pay | Admitting: Family Medicine

## 2016-08-06 VITALS — BP 143/78 | HR 75 | Temp 97.0°F | Ht 64.0 in | Wt 139.6 lb

## 2016-08-06 DIAGNOSIS — L03116 Cellulitis of left lower limb: Secondary | ICD-10-CM | POA: Diagnosis not present

## 2016-08-06 MED ORDER — DOXYCYCLINE HYCLATE 100 MG PO TABS: 100.0000 mg | ORAL_TABLET | Freq: Two times a day (BID) | ORAL | 0 refills | Status: DC

## 2016-08-06 NOTE — Patient Instructions (Signed)
Great to see you!  Continue keflex, start doxycycline as well  Come back to see Dr. Laurance Flatten friday as planned.   PLease seek help in the emergency room if she has worsening of swelling, worsening of the redness, fevers, or other sudden changes.

## 2016-08-06 NOTE — Progress Notes (Signed)
   HPI  Patient presents today here with cellulitis for hospital follow-up.  Patient explains with her caretaker that she developed redness of the right lower extremity Friday which was a sudden change. Her caretaker is not aware of previous leg swelling similar to that she has had. She states that her leg swelling or redness have not improved on Keflex.  She was seen in the emergency room and treated with Keflex, also DVT was ruled out with venous Doppler.  Patient states that she's tolerating Lights without a problem, she has not noticed any worsening of the leg. She has not had any improvement.   PMH: Smoking status noted ROS: Per HPI  Objective: BP (!) 143/78   Pulse 75   Temp 97 F (36.1 C) (Oral)   Ht 5\' 4"  (1.626 m)   Wt 139 lb 9.6 oz (63.3 kg)   LMP 08/01/1982   SpO2 96%   BMI 23.96 kg/m  Gen: NAD, alert, cooperative with exam HEENT: NCAT CV: RRR, good S1/S2, no murmur Resp: CTABL, no wheezes, non-labored Ext: 2+ edema of the left lower extremity to the mid thigh, 1+ pitting edema on the right lower extremity extending to the midcalf Neuro: Alert and oriented, No gross deficits  Skin:  Erythema extending to approximately the lower two thirds of her left lower extremity and stopping just proximal to her ankle. She appears to have some chronic scarring of skin around the ankle of the left lower extremity causing narrowing compared to the right lower extremity. There is warmth over the erythema. No streaking above the area of erythema.   Assessment and plan:  # Right lower extremity cellulitis Continue Keflex, add doxycycline Patient has follow-up with PCP in 3 days. Recommended very low threshold for emergency room return if she develops any fevers, extension of the rash, or new symptoms that are concerning. There are several reports of left lower extremity swelling in JUne and July 2017 Dopplers have ruled out DVT  Will discuss with clincal pharmacist for  early INR re-check and coumadin adjustments.     Meds ordered this encounter  Medications  . doxycycline (VIBRA-TABS) 100 MG tablet    Sig: Take 1 tablet (100 mg total) by mouth 2 (two) times daily. 1 po bid    Dispense:  20 tablet    Refill:  0    Laroy Apple, MD [facility] 08/06/2016, 7:17 PM

## 2016-08-07 ENCOUNTER — Telehealth: Payer: Self-pay | Admitting: Pharmacist

## 2016-08-07 NOTE — Telephone Encounter (Signed)
Outside blood work from [facility] had a serum ferritin that was low and a serum iron that was within normal limits, but closer to the lower end of normal.. The hemoglobin here most recently was 9.7. Slightly decreased from the hemoglobin that was done at [facility]. The fecal occult blood test card was negative. This is most likely an anemia of chronic disease. Start Integra +   1 daily and recheck CBC in 4 weeks. Remind the son and the patient that this will turn her stools dark colored.

## 2016-08-07 NOTE — Telephone Encounter (Signed)
Called patient's son to give adjustment in warfarin since starting doxycycline.  Suggested that patient take 5mg  tonight and 2.5mg  tomorrow instead of 5mg .  Recheck INR Friday, April 6th at appt with PCP. Patient's son mentions that Patricia Villa fell today and he is on his way over to check on her.  She has hurt her hand.  I told him to call office after his arrives at her home if she thinks she needs to be seen tonight.  I did remind him that we do not have x-ray after 5pm.  Son also asks about recent HBG results.  Reviewed and HBG has been decreasing was 11.1 (04/02/2016), then 9.7(07/26/2016) and lastly 9.4 (08/02/2016) at hospital.  I will forward message to patient's PCP for advise.  She has appt with PCP 08/09/16.

## 2016-08-08 ENCOUNTER — Ambulatory Visit (INDEPENDENT_AMBULATORY_CARE_PROVIDER_SITE_OTHER): Payer: Medicare Other

## 2016-08-08 ENCOUNTER — Encounter: Payer: Self-pay | Admitting: Nurse Practitioner

## 2016-08-08 ENCOUNTER — Ambulatory Visit (INDEPENDENT_AMBULATORY_CARE_PROVIDER_SITE_OTHER): Payer: Medicare Other | Admitting: Nurse Practitioner

## 2016-08-08 VITALS — BP 97/56 | HR 65 | Temp 96.7°F | Ht 64.0 in | Wt 139.0 lb

## 2016-08-08 DIAGNOSIS — M25532 Pain in left wrist: Secondary | ICD-10-CM

## 2016-08-08 DIAGNOSIS — I878 Other specified disorders of veins: Secondary | ICD-10-CM | POA: Diagnosis not present

## 2016-08-08 NOTE — Telephone Encounter (Signed)
PER [facility] this am - do not start med yet - she has appt tomorrow and we will discuss then and do another CBC and FOBT.

## 2016-08-08 NOTE — Patient Instructions (Signed)
Venous Ulcer A venous ulcer is a shallow sore on your lower leg. It is caused by poor circulation in your veins. Venous ulcer is the most common type of lower leg ulcer. You may have venous ulcers on one leg or on both legs. This condition most often develops around your ankles. This type of ulcer may last for a long time (chronic ulcer) or it may return often (recurrent ulcer). Follow these instructions at home: Wound care   Follow instructions from your doctor about:  How to take care of your wound.  When and how you should change your bandage (dressing).  When you should remove your bandage. If your bandage is dry and gets stuck to your leg when you try to remove it, moisten or wet the bandage with saline solution or water. This helps you to remove it without harming your skin or wound.  Check your wound every day for signs of infection. Have a caregiver do this for you if you are not able to do it yourself. Watch for:  More redness, swelling, or pain.  More fluid or blood.  Pus, warmth, or a bad smell. Medicines   Take over-the-counter and prescription medicines only as told by your doctor.  If you were prescribed an antibiotic medicine, take it or apply it as told by your doctor. Do not stop taking or using the antibiotic even if your condition improves. Activity   Do not stand or sit in one position for a long period of time. Rest with your legs raised during the day. If possible, keep your legs above your heart for 30 minutes, 3-4 times a day, or as told by your doctor.  Do not sit with your legs crossed.  Walk often to increase the blood flow in your legs. Ask your doctor what level of activity is safe for you.  If you are taking a long ride in a car or plane, take a break to walk around at least once every two hours, or as told by your doctor. Ask your doctor if you should take aspirin before long trips. General instructions    Wear elastic stockings, compression  stockings, or support hose as told by your doctor. This is very important.  Raise the foot of your bed as told by your doctor.  Do not smoke.  Keep all follow-up visits as told by your doctor. This is important. Contact a doctor if:  You have a fever.  Your ulcer is getting larger or is not healing.  Your pain gets worse.  You have more redness or swelling around your ulcer.  You have more fluid, blood, or pus coming from your ulcer after it has been cleaned by you or your doctor.  You have warmth or a bad smell coming from your ulcer. This information is not intended to replace advice given to you by your health care provider. Make sure you discuss any questions you have with your health care provider. Document Released: 05/30/2004 Document Revised: 09/28/2015 Document Reviewed: 08/31/2014 Elsevier Interactive Patient Education  2017 [employer].

## 2016-08-08 NOTE — Progress Notes (Addendum)
   Subjective:    Patient ID: Patricia Villa, female    DOB: 07/01/1932, 83 y.o.   MRN: [id number]  HPI Patient aid she got up and took her meds this morning and then went back to bed. Said that she rolled over and fell out of bed landing on her left hand and arm.   * Left lower ext erythematous and swollen- Saw dr. Wendi Snipes Friday and was dx with cellulitis- was started on antibiotic- had doppler study Saturday which was negative for clot. Redness some better- swelling no better.  Review of Systems  Constitutional: Negative.   Respiratory: Negative.   Cardiovascular: Negative.   Genitourinary: Negative.   Musculoskeletal:       Left hand injury  Neurological: Negative.   Psychiatric/Behavioral: Negative.   All other systems reviewed and are negative.      Objective:   Physical Exam  Constitutional: She appears well-developed and well-nourished. No distress.  Cardiovascular: Normal rate.   Pulmonary/Chest: Effort normal.  Musculoskeletal: She exhibits edema (3+ edema left lower leg).  FROM of left hand, wrist,elbow and shoulder- echymosis of entire hand with edema- ring had to be cut off of left ring finger. Right posterior shoulder echymosis- not sure that this isnt and old contusion.  Neurological: She is alert.  Skin: Skin is warm.  Psychiatric: She has a normal mood and affect. Her behavior is normal. Thought content normal.   BP (!) 97/56 (BP Location: Right Arm, Patient Position: Sitting, Cuff Size: Normal)   Pulse 65   Temp (!) 96.7 F (35.9 C) (Oral)   Ht 5\' 4"  (1.626 m)   Wt 139 lb (63 kg)   LMP 08/01/1982   BMI 23.86 kg/m    Left hand and wrist- no visible fracture-Preliminary reading by Ronnald Collum, FNP  [facility]'     Assessment & Plan:  1. Acute pain of left wrist Ice TID Elevate Watch circulation in tips of fingers- if finger tips turn blue- to ER - DG Wrist Complete Left; Future  2. Venous stasis of lower extremity Increase lasix to 40mg  in morning  and 20mg  in afternoon Elevate leg when sitting Unna boot applied RTO for recheck on Monday  [place], FNP

## 2016-08-09 ENCOUNTER — Ambulatory Visit (INDEPENDENT_AMBULATORY_CARE_PROVIDER_SITE_OTHER): Payer: Medicare Other | Admitting: Family Medicine

## 2016-08-09 ENCOUNTER — Encounter: Payer: Self-pay | Admitting: Family Medicine

## 2016-08-09 VITALS — BP 114/70 | HR 67 | Temp 97.0°F | Ht 64.0 in | Wt 139.0 lb

## 2016-08-09 DIAGNOSIS — J90 Pleural effusion, not elsewhere classified: Secondary | ICD-10-CM

## 2016-08-09 DIAGNOSIS — I481 Persistent atrial fibrillation: Secondary | ICD-10-CM

## 2016-08-09 DIAGNOSIS — R8299 Other abnormal findings in urine: Secondary | ICD-10-CM | POA: Diagnosis not present

## 2016-08-09 DIAGNOSIS — E7800 Pure hypercholesterolemia, unspecified: Secondary | ICD-10-CM | POA: Diagnosis not present

## 2016-08-09 DIAGNOSIS — R5383 Other fatigue: Secondary | ICD-10-CM | POA: Diagnosis not present

## 2016-08-09 DIAGNOSIS — E559 Vitamin D deficiency, unspecified: Secondary | ICD-10-CM | POA: Diagnosis not present

## 2016-08-09 DIAGNOSIS — R296 Repeated falls: Secondary | ICD-10-CM | POA: Diagnosis not present

## 2016-08-09 DIAGNOSIS — R710 Precipitous drop in hematocrit: Secondary | ICD-10-CM | POA: Diagnosis not present

## 2016-08-09 DIAGNOSIS — I4819 Other persistent atrial fibrillation: Secondary | ICD-10-CM

## 2016-08-09 DIAGNOSIS — T07XXXA Unspecified multiple injuries, initial encounter: Secondary | ICD-10-CM

## 2016-08-09 DIAGNOSIS — R82998 Other abnormal findings in urine: Secondary | ICD-10-CM

## 2016-08-09 DIAGNOSIS — I10 Essential (primary) hypertension: Secondary | ICD-10-CM

## 2016-08-09 LAB — URINALYSIS, COMPLETE
BILIRUBIN UA: NEGATIVE
GLUCOSE, UA: NEGATIVE
Ketones, UA: NEGATIVE
LEUKOCYTES UA: NEGATIVE
Nitrite, UA: NEGATIVE
PH UA: 7 (ref 5.0–7.5)
PROTEIN UA: NEGATIVE
SPEC GRAV UA: 1.015 (ref 1.005–1.030)
UUROB: 0.2 mg/dL (ref 0.2–1.0)

## 2016-08-09 LAB — MICROSCOPIC EXAMINATION
Bacteria, UA: NONE SEEN
RENAL EPITHEL UA: NONE SEEN /HPF

## 2016-08-09 LAB — COAGUCHEK XS/INR WAIVED
INR: 4 — ABNORMAL HIGH (ref 0.9–1.1)
PROTHROMBIN TIME: 48.1 s

## 2016-08-09 MED ORDER — FUROSEMIDE 20 MG PO TABS: 20.0000 mg | ORAL_TABLET | Freq: Three times a day (TID) | ORAL | 3 refills | Status: DC

## 2016-08-09 MED ORDER — INTEGRA PLUS PO CAPS: 1.0000 | ORAL_CAPSULE | Freq: Every day | ORAL | 3 refills | Status: DC

## 2016-08-09 NOTE — Patient Instructions (Addendum)
Anticoagulation Dose Instructions as of 08/09/2016      Dorene Grebe Tue Wed Thu Fri Sat   08/09/16 and 08/10/16      Hold 2.5 mg   New dose 5 mg 5 mg 2.5 mg 5 mg 2.5 mg 5 mg 5 mg    Description   No warfarin today - Friday, April 6th.  Take 1/2 tablet tomorro Saturday, April 7th.  Then change warfarin dose to 1/2 tablet on Tuesdays and Thursdays.  Take 1 tablet all other days.  INR today:  4.0 (goal: 2.0 to 3.0)                            Medicare Annual Wellness Visit  [facility] and the medical providers at [facility] strive to bring you the best medical care.  In doing so we not only want to address your current medical conditions and concerns but also to detect new conditions early and prevent illness, disease and health-related problems.    Medicare offers a yearly Wellness Visit which allows our clinical staff to assess your need for preventative services including immunizations, lifestyle education, counseling to decrease risk of preventable diseases and screening for fall risk and other medical concerns.    This visit is provided free of charge (no copay) for all Medicare recipients. The clinical pharmacists at [facility] have begun to conduct these Wellness Visits which will also include a thorough review of all your medications.    As you primary medical provider recommend that you make an appointment for your Annual Wellness Visit if you have not done so already this year.  You may set up this appointment before you leave today or you may call back ([phone]) and schedule an appointment.  Please make sure when you call that you mention that you are scheduling your Annual Wellness Visit with the clinical pharmacist so that the appointment may be made for the proper length of time.     Continue current medications. Continue good therapeutic lifestyle changes which include good diet and exercise. Fall precautions discussed with patient. If an  FOBT was given today- please return it to our front desk. If you are over 70 years old - you may need Prevnar 24 or the adult Pneumonia vaccine.  **Flu shots are available--- please call and schedule a FLU-CLINIC appointment**  After your visit with [place] today you will receive a survey in the mail or online from [employer] regarding your care with [place]. Please take a moment to fill this out. Your feedback is very important to [place] as you can help [place] better understand your patient needs as well as improve your experience and satisfaction. WE CARE ABOUT YOU!!!   Follow-up with pulmonary as planned Follow-up with repeat Unna boot on Monday At that time consider decreasing the Lasix some depending on the amount of weight loss and breathing Wheelchair prescription for patient Encouraged patient not to be climbing steps to go into another room Encourage patient to respect caregiver at home Take Cymbalta at bedtime Continue with antibiotic Use soaps fabric softeners and detergents that are scent free Use antibiotic ointment on areas of back that are open and not scarred over.

## 2016-08-09 NOTE — Addendum Note (Signed)
Addended by: Zannie Cove on: 08/09/2016 01:52 PM   Modules accepted: Orders

## 2016-08-09 NOTE — Addendum Note (Signed)
Addended by: Liliane Bade on: 08/09/2016 03:24 PM   Modules accepted: Orders

## 2016-08-09 NOTE — Progress Notes (Signed)
Subjective:    Patient ID: Patricia Villa, female    DOB: January 17, 1933, 83 y.o.   MRN: [id number]  HPI Pt here for follow up and management of chronic medical problems which includes hyperlipidemia, hypertension and a fib. She is taking medication regulalr.The patient comes to the visit today with her son. She is recently had increase following at home with multiple contusions of the arm and leg and about of cellulitis with her leg. An Unna boot was applied yesterday she will return to clinic on Monday to reapply this. Her Lasix yesterday was increased to 40 in the morning and 20 in the afternoon. She has had one negative fecal occult blood test. Her hemoglobin is 9.4. Her white blood cell count was normal. She does have a moderate left pleural effusion and a small right pleural effusion. She has follow-up scheduled with the pulmonologist soon. We will make him aware of the pleural effusions that are present. X-rays were reviewed of the wrist and arm and there was no fracture only soft tissue swelling. When she returns her next fecal occult blood test card if it is negative we will go ahead and start her on Integra +1 daily Will give them a prescription for this today to hold until the FOBT result is returned. The patient denies any chest pain. She does have some worsening shortness of breath even though the son is been checking her pulse ox is at home and they've been running in the 90s. The chest x-ray did show pleural effusions. Yesterday when she came in and an [place] boot was applied we did increase her Lasix to 40 in the morning and 20 in the evening. Hopefully this will help take care of the breathing some the pleural effusion and will help with taking care of the edema in her leg and the cellulitis in her leg also. She is having no problems with her GI tract. She does have some dark urine as we will check another urine today but the most recent one was clear of any infection. We did encourage her to  drink more water and more fluids.    Patient Active Problem List   Diagnosis Date Noted  . Secondary pulmonary arterial hypertension 07/05/2016  . Dysuria 06/12/2016  . History of cystocele 06/12/2016  . Vocal cord paralysis, unilateral complete   . Vertebrobasilar insufficiency   . Stroke due to embolism of left cerebellar artery ([place])   . Aortic atherosclerosis ([place]) 11/20/2015  . Atrial fibrillation, persistent ([place]) 10/30/2015  . Atrial fibrillation ([place]) 11/23/2014  . Osteoporosis 08/31/2014  . Chronic neck pain 06/07/2014  . DDD (degenerative disc disease), cervical 01/24/2014  . Hyperlipidemia 05/17/2013  . Colitis 01/20/2013  . Generalized anxiety disorder 08/05/2012  . Meningioma ([place]) 08/05/2012  . Bruit 10/17/2010  . Essential hypertension, benign 10/04/2009  . MITRAL INSUFFICIENCY 09/26/2009  . Mitral valve disorder 09/26/2009  . Hypertrophic obstructive cardiomyopathy ([place]) 09/26/2009   Outpatient Encounter Prescriptions as of 08/09/2016  Medication Sig  . cephALEXin (KEFLEX) 500 MG capsule Take 1 capsule (500 mg total) by mouth 4 (four) times daily.  . Cholecalciferol (VITAMIN D3) 2000 UNITS TABS Take 2,000 Units by mouth daily.  Marland Kitchen donepezil (ARICEPT) 10 MG tablet Take 1 tablet (10 mg total) by mouth at bedtime. (Patient taking differently: Take 10 mg by mouth daily. )  . doxycycline (VIBRA-TABS) 100 MG tablet Take 1 tablet (100 mg total) by mouth 2 (two) times daily. 1 po bid  . DULoxetine (CYMBALTA)  30 MG capsule Take 30 mg by mouth daily.  . folic acid (FOLVITE) 1 MG tablet Take 1 mg by mouth daily.  . furosemide (LASIX) 20 MG tablet Take 20 mg by mouth 2 (two) times daily. M, W, F- TAKES BID, ALL OTHER DAYS TAKES 20MG   CHANGED Friday  . metoprolol succinate (TOPROL-XL) 25 MG 24 hr tablet Take 0.5 tablets (12.5 mg total) by mouth daily.  Marland Kitchen warfarin (COUMADIN) 5 MG tablet Take 1 tablet (5 mg total) by mouth daily.   No facility-administered encounter  medications on file as of 08/09/2016.       Review of Systems  Constitutional: Negative.        Falls asleep frequently.   HENT: Negative.   Eyes: Negative.   Respiratory: Negative.   Cardiovascular: Negative.   Gastrointestinal: Negative.   Endocrine: Negative.   Genitourinary: Negative.   Musculoskeletal: Negative.        Recent falls - left arm pain and bruising   Skin: Negative.        Cellulitis lower left leg  Allergic/Immunologic: Negative.   Neurological: Negative.   Hematological: Negative.   Psychiatric/Behavioral: Negative.        Objective:   Physical Exam  Constitutional: She is oriented to person, place, and time. She appears well-developed and well-nourished. No distress.  Patient was calm and alert and cooperative and seemed to listen today and seemed to have an understanding of everything that is going on with her.  HENT:  Head: Normocephalic and atraumatic.  Right Ear: External ear normal.  Left Ear: External ear normal.  Nose: Nose normal.  Mouth/Throat: Oropharynx is clear and moist.  Eyes: Conjunctivae and EOM are normal. Pupils are equal, round, and reactive to light. Right eye exhibits no discharge. Left eye exhibits no discharge. No scleral icterus.  Neck: Normal range of motion. Neck supple. No thyromegaly present.  Cardiovascular: Normal rate, regular rhythm and normal heart sounds.   No murmur heard. The heart was actually regular today at 72/m  Pulmonary/Chest: Effort normal and breath sounds normal. No respiratory distress. She has no wheezes. She has no rales.  Abdominal: Soft. Bowel sounds are normal. She exhibits no mass. There is no tenderness. There is no rebound and no guarding.  No significant tenderness or organ enlargement bruits or masses.  Musculoskeletal: She exhibits no edema.  Patient is in a wheelchair today due to weakness and recent falls.  Lymphadenopathy:    She has no cervical adenopathy.  Neurological: She is alert and  oriented to person, place, and time.  Skin: Skin is warm and dry. Rash noted. There is pallor.  Contusions of right shoulder and left arm. Excoriations of back  Psychiatric: She has a normal mood and affect. Her behavior is normal. Judgment and thought content normal.  Nursing note and vitals reviewed.  BP 114/70 (BP Location: Left Arm)   Pulse 67   Temp 97 F (36.1 C) (Oral)   Ht 5' 4"  (1.626 m)   Wt 139 lb (63 kg)   LMP 08/01/1982   BMI 23.86 kg/m         Assessment & Plan:  1. Atrial fibrillation, persistent ([place]) -Continue with blood thinner for now. We'll discuss the recent falls with the cardiologist. -Follow-up with cardiology as planned - CBC with Differential/Platelet - CoaguChek XS/INR Waived  2. Frequent falls -More precautions. Because of ongoing weakness and following she will be prescribed a wheelchair. - CBC with Differential/Platelet  3. Essential hypertension, benign -  Despite extra fluid pill vital signs are stable today and blood pressure is stable. - CBC with Differential/Platelet - BMP8+EGFR - Hepatic function panel  4. Pure hypercholesterolemia -Continue with as aggressive therapeutic lifestyle changes as possible and any current medicine for cholesterol - CBC with Differential/Platelet - Lipid panel  5. Vitamin D deficiency -Continue with current treatment pending results of lab work - CBC with Differential/Platelet - VITAMIN D 25 Hydroxy (Vit-D Deficiency, Fractures)  6. Decreased hemoglobin -We will check 1 more FOBT which has yet to be returned and we'll start her on Integra plus once that is returned. - CBC with Differential/Platelet - Fecal occult blood, imunochemical; Future  7. Dark brown-colored urine -Drink more water - Urine culture - Urinalysis, Complete  8. Pleural effusion -Continue with Lasix 40 in the morning and 20 in the evening at least until the first of the week and may reduce the amount of Lasix at that time if  weight is down.  9. Multiple contusions -Every effort to prevent falls. No climbing. Wheelchair at home. Walker.  Meds ordered this encounter  Medications  . furosemide (LASIX) 20 MG tablet    Sig: Take 1 tablet (20 mg total) by mouth 3 (three) times daily. As directed    Dispense:  90 tablet    Refill:  3  . FeFum-FePoly-FA-B Cmp-C-Biot (INTEGRA PLUS) CAPS    Sig: Take 1 capsule by mouth daily.    Dispense:  30 capsule    Refill:  3   Patient Instructions                       Medicare Annual Wellness Visit  [facility] and the medical providers at [facility] strive to bring you the best medical care.  In doing so we not only want to address your current medical conditions and concerns but also to detect new conditions early and prevent illness, disease and health-related problems.    Medicare offers a yearly Wellness Visit which allows our clinical staff to assess your need for preventative services including immunizations, lifestyle education, counseling to decrease risk of preventable diseases and screening for fall risk and other medical concerns.    This visit is provided free of charge (no copay) for all Medicare recipients. The clinical pharmacists at [facility] have begun to conduct these Wellness Visits which will also include a thorough review of all your medications.    As you primary medical provider recommend that you make an appointment for your Annual Wellness Visit if you have not done so already this year.  You may set up this appointment before you leave today or you may call back ([phone]) and schedule an appointment.  Please make sure when you call that you mention that you are scheduling your Annual Wellness Visit with the clinical pharmacist so that the appointment may be made for the proper length of time.     Continue current medications. Continue good therapeutic lifestyle changes which include good diet and  exercise. Fall precautions discussed with patient. If an FOBT was given today- please return it to our front desk. If you are over 92 years old - you may need Prevnar 74 or the adult Pneumonia vaccine.  **Flu shots are available--- please call and schedule a FLU-CLINIC appointment**  After your visit with [place] today you will receive a survey in the mail or online from [employer] regarding your care with [place]. Please take a moment to  fill this out. Your feedback is very important to [place] as you can help [place] better understand your patient needs as well as improve your experience and satisfaction. WE CARE ABOUT YOU!!!   Follow-up with pulmonary as planned Follow-up with repeat Unna boot on Monday At that time consider decreasing the Lasix some depending on the amount of weight loss and breathing Wheelchair prescription for patient Encouraged patient not to be climbing steps to go into another room Encourage patient to respect caregiver at home Take Cymbalta at bedtime Continue with antibiotic Use soaps fabric softeners and detergents that are scent free Use antibiotic ointment on areas of back that are open and not scarred over.  Arrie Senate MD

## 2016-08-10 LAB — LIPID PANEL
CHOL/HDL RATIO: 2.9 ratio (ref 0.0–4.4)
Cholesterol, Total: 98 mg/dL — ABNORMAL LOW (ref 100–199)
HDL: 34 mg/dL — ABNORMAL LOW (ref >39)
LDL Calculated: 49 mg/dL (ref 0–99)
Triglycerides: 75 mg/dL (ref 0–149)
VLDL Cholesterol Cal: 15 mg/dL (ref 5–40)

## 2016-08-10 LAB — CBC WITH DIFFERENTIAL/PLATELET
BASOS: 1 %
Basophils Absolute: 0.1 10*3/uL (ref 0.0–0.2)
EOS (ABSOLUTE): 0.1 10*3/uL (ref 0.0–0.4)
EOS: 1 %
Hematocrit: 32.6 % — ABNORMAL LOW (ref 34.0–46.6)
Hemoglobin: 9.9 g/dL — ABNORMAL LOW (ref 11.1–15.9)
IMMATURE GRANS (ABS): 0 10*3/uL (ref 0.0–0.1)
IMMATURE GRANULOCYTES: 0 %
Lymphocytes Absolute: 1 10*3/uL (ref 0.7–3.1)
Lymphs: 19 %
MCH: 24.4 pg — AB (ref 26.6–33.0)
MCHC: 30.4 g/dL — ABNORMAL LOW (ref 31.5–35.7)
MCV: 80 fL (ref 79–97)
MONOS ABS: 0.7 10*3/uL (ref 0.1–0.9)
Monocytes: 14 %
NEUTROS ABS: 3.6 10*3/uL (ref 1.4–7.0)
NEUTROS PCT: 65 %
PLATELETS: 285 10*3/uL (ref 150–379)
RBC: 4.06 x10E6/uL (ref 3.77–5.28)
RDW: 19.2 % — AB (ref 12.3–15.4)
WBC: 5.5 10*3/uL (ref 3.4–10.8)

## 2016-08-10 LAB — HEPATIC FUNCTION PANEL
ALK PHOS: 74 IU/L (ref 39–117)
ALT: 21 IU/L (ref 0–32)
AST: 36 IU/L (ref 0–40)
Albumin: 3.5 g/dL (ref 3.5–4.7)
BILIRUBIN TOTAL: 2 mg/dL — AB (ref 0.0–1.2)
BILIRUBIN, DIRECT: 0.95 mg/dL — AB (ref 0.00–0.40)
Total Protein: 7.1 g/dL (ref 6.0–8.5)

## 2016-08-10 LAB — BMP8+EGFR
BUN / CREAT RATIO: 23 (ref 12–28)
BUN: 16 mg/dL (ref 8–27)
CALCIUM: 9.1 mg/dL (ref 8.7–10.3)
CO2: 25 mmol/L (ref 18–29)
Chloride: 92 mmol/L — ABNORMAL LOW (ref 96–106)
Creatinine, Ser: 0.69 mg/dL (ref 0.57–1.00)
GFR, EST AFRICAN AMERICAN: 94 mL/min/{1.73_m2} (ref >59)
GFR, EST NON AFRICAN AMERICAN: 82 mL/min/{1.73_m2} (ref >59)
Glucose: 95 mg/dL (ref 65–99)
Potassium: 3.5 mmol/L (ref 3.5–5.2)
Sodium: 133 mmol/L — ABNORMAL LOW (ref 134–144)

## 2016-08-10 LAB — VITAMIN D 25 HYDROXY (VIT D DEFICIENCY, FRACTURES): VIT D 25 HYDROXY: 67.4 ng/mL (ref 30.0–100.0)

## 2016-08-10 LAB — FECAL OCCULT BLOOD, IMMUNOCHEMICAL: FECAL OCCULT BLD: NEGATIVE

## 2016-08-11 LAB — URINE CULTURE: Organism ID, Bacteria: NO GROWTH

## 2016-08-12 ENCOUNTER — Encounter: Payer: Self-pay | Admitting: Nurse Practitioner

## 2016-08-12 ENCOUNTER — Ambulatory Visit: Payer: Medicare Other | Admitting: Physical Therapy

## 2016-08-12 ENCOUNTER — Ambulatory Visit (INDEPENDENT_AMBULATORY_CARE_PROVIDER_SITE_OTHER): Payer: Medicare Other | Admitting: Nurse Practitioner

## 2016-08-12 VITALS — BP 111/65 | HR 67 | Temp 98.6°F | Ht 64.0 in | Wt 141.0 lb

## 2016-08-12 DIAGNOSIS — I4819 Other persistent atrial fibrillation: Secondary | ICD-10-CM

## 2016-08-12 DIAGNOSIS — I480 Paroxysmal atrial fibrillation: Secondary | ICD-10-CM | POA: Diagnosis not present

## 2016-08-12 DIAGNOSIS — R29898 Other symptoms and signs involving the musculoskeletal system: Secondary | ICD-10-CM

## 2016-08-12 DIAGNOSIS — L989 Disorder of the skin and subcutaneous tissue, unspecified: Secondary | ICD-10-CM | POA: Diagnosis not present

## 2016-08-12 DIAGNOSIS — R296 Repeated falls: Secondary | ICD-10-CM

## 2016-08-12 DIAGNOSIS — R2681 Unsteadiness on feet: Secondary | ICD-10-CM

## 2016-08-12 DIAGNOSIS — I878 Other specified disorders of veins: Secondary | ICD-10-CM | POA: Diagnosis not present

## 2016-08-12 DIAGNOSIS — I481 Persistent atrial fibrillation: Secondary | ICD-10-CM | POA: Diagnosis not present

## 2016-08-12 LAB — COAGUCHEK XS/INR WAIVED
INR: 3.3 — ABNORMAL HIGH (ref 0.9–1.1)
Prothrombin Time: 39.1 s

## 2016-08-12 LAB — THYROID PANEL WITH TSH
Free Thyroxine Index: 3.4 (ref 1.2–4.9)
T3 UPTAKE RATIO: 40 % — AB (ref 24–39)
T4, Total: 8.5 ug/dL (ref 4.5–12.0)
TSH: 1.33 u[IU]/mL (ref 0.450–4.500)

## 2016-08-12 LAB — SPECIMEN STATUS REPORT

## 2016-08-12 MED ORDER — MUPIROCIN 2 % EX OINT: 1.0000 "application " | TOPICAL_OINTMENT | Freq: Two times a day (BID) | CUTANEOUS | 1 refills | Status: DC

## 2016-08-12 NOTE — Progress Notes (Signed)
   Subjective:    Patient ID: Patricia Villa, female    DOB: Dec 05, 1932, 83 y.o.   MRN: [id number]  HPI  Patient is here today for recheck of venous stasis- she was seen on 08/09/14 for a fall and just happened to show me her swollen left lower ext. We increased her lasix to 40 mg in morning and 20 g in aftrnnon and applied an unna boot. She si here today for recheck.   Review of Systems  Constitutional: Negative.   HENT: Negative.   Respiratory: Negative for cough and shortness of breath.   Cardiovascular: Negative.   Genitourinary: Negative.   Neurological: Negative.   Psychiatric/Behavioral: Negative.   All other systems reviewed and are negative.      Objective:   Physical Exam  Constitutional: She appears well-developed and well-nourished. No distress.  Cardiovascular: Normal rate.   irregular  Pulmonary/Chest: Effort normal and breath sounds normal.  Musculoskeletal: She exhibits edema (3+ edema from just below left knee to lower thigh- some erythema but cool to touch.).  Neurological: She is alert.  Skin:  Multiple scratched scabbed over skin lesions on back  Psychiatric: She has a normal mood and affect. Her behavior is normal. Judgment and thought content normal.    BP 111/65   Pulse 67   Temp 98.6 F (37 C) (Oral)   LMP 08/01/1982        Assessment & Plan:   1. Gait instability   2. Bilateral leg weakness   3. Frequent falls   4. Paroxysmal atrial fibrillation (HCC)   5. Atrial fibrillation, persistent ([place])   6. Venous stasis of lower extremity   7. Skin lesion of back     Continue lasix 40mg  in morning and 20mg  in afternoon Elevate leg when sitting wear compression hose bactroban oint to places on back Keep follow up appointent with Dr. Mayra Neer, FNP

## 2016-08-13 ENCOUNTER — Ambulatory Visit: Payer: Medicare Other | Admitting: Physical Therapy

## 2016-08-19 ENCOUNTER — Ambulatory Visit (INDEPENDENT_AMBULATORY_CARE_PROVIDER_SITE_OTHER): Payer: Medicare Other | Admitting: Pharmacist

## 2016-08-19 ENCOUNTER — Encounter: Payer: Self-pay | Admitting: Pharmacist

## 2016-08-19 VITALS — Wt 139.5 lb

## 2016-08-19 DIAGNOSIS — I480 Paroxysmal atrial fibrillation: Secondary | ICD-10-CM

## 2016-08-19 DIAGNOSIS — I481 Persistent atrial fibrillation: Secondary | ICD-10-CM | POA: Diagnosis not present

## 2016-08-19 DIAGNOSIS — I4819 Other persistent atrial fibrillation: Secondary | ICD-10-CM

## 2016-08-19 DIAGNOSIS — R17 Unspecified jaundice: Secondary | ICD-10-CM | POA: Diagnosis not present

## 2016-08-19 LAB — COAGUCHEK XS/INR WAIVED
INR: 3.2 — AB (ref 0.9–1.1)
PROTHROMBIN TIME: 38.9 s

## 2016-08-19 MED ORDER — FUROSEMIDE 20 MG PO TABS: 40.0000 mg | ORAL_TABLET | Freq: Two times a day (BID) | ORAL | 1 refills | Status: DC

## 2016-08-19 NOTE — Patient Instructions (Addendum)
Anticoagulation Dose Instructions as of 08/19/2016      Dorene Grebe Tue Wed Thu Fri Sat   April 17th through April 21st   Hold 2.5 mg 5 mg 2.5 mg 5 mg   Continue like this week until next appointment 09/02/2016 5 mg 2.5 mg 5 mg 2.5 mg 5 mg 2.5 mg 5 mg    Description   No warfarin tomorrow - Tuesday, April 17th. Then change warfarin dose to 1/2 tablet on Mondays, Wednesdays and Fridays.   Take 1 tablet all other days.  INR today:  3.2 (goal: 2.0 to 3.0)     Increase furosemide to 40mg  twice a day (morning and lunchtime)  Wear compression stockings

## 2016-08-20 LAB — HEPATIC FUNCTION PANEL
ALT: 21 IU/L (ref 0–32)
AST: 44 IU/L — ABNORMAL HIGH (ref 0–40)
Albumin: 3.6 g/dL (ref 3.5–4.7)
Alkaline Phosphatase: 76 IU/L (ref 39–117)
BILIRUBIN TOTAL: 1.8 mg/dL — AB (ref 0.0–1.2)
Bilirubin, Direct: 0.83 mg/dL — ABNORMAL HIGH (ref 0.00–0.40)
Total Protein: 7.4 g/dL (ref 6.0–8.5)

## 2016-08-21 ENCOUNTER — Other Ambulatory Visit: Payer: Self-pay | Admitting: *Deleted

## 2016-08-21 DIAGNOSIS — R945 Abnormal results of liver function studies: Principal | ICD-10-CM

## 2016-08-21 DIAGNOSIS — R7989 Other specified abnormal findings of blood chemistry: Secondary | ICD-10-CM

## 2016-08-22 ENCOUNTER — Encounter: Payer: Self-pay | Admitting: Family Medicine

## 2016-08-22 ENCOUNTER — Ambulatory Visit (INDEPENDENT_AMBULATORY_CARE_PROVIDER_SITE_OTHER): Payer: Medicare Other | Admitting: Family Medicine

## 2016-08-22 VITALS — BP 128/73 | HR 69 | Temp 96.4°F | Ht 64.0 in | Wt 136.0 lb

## 2016-08-22 DIAGNOSIS — R609 Edema, unspecified: Secondary | ICD-10-CM

## 2016-08-22 DIAGNOSIS — I878 Other specified disorders of veins: Secondary | ICD-10-CM | POA: Diagnosis not present

## 2016-08-22 DIAGNOSIS — R945 Abnormal results of liver function studies: Secondary | ICD-10-CM

## 2016-08-22 DIAGNOSIS — R7989 Other specified abnormal findings of blood chemistry: Secondary | ICD-10-CM

## 2016-08-22 DIAGNOSIS — R2681 Unsteadiness on feet: Secondary | ICD-10-CM | POA: Diagnosis not present

## 2016-08-22 DIAGNOSIS — I480 Paroxysmal atrial fibrillation: Secondary | ICD-10-CM | POA: Diagnosis not present

## 2016-08-22 DIAGNOSIS — R710 Precipitous drop in hematocrit: Secondary | ICD-10-CM | POA: Diagnosis not present

## 2016-08-22 DIAGNOSIS — I879 Disorder of vein, unspecified: Secondary | ICD-10-CM | POA: Diagnosis not present

## 2016-08-22 NOTE — Patient Instructions (Signed)
Keep feet elevated as much as possible  Return for labs in 3 weeks or so. (CBC, LFT and BMP) We will call you about the labs drawn today.

## 2016-08-22 NOTE — Progress Notes (Signed)
Subjective:    Patient ID: Patricia Villa, female    DOB: 17-Nov-1932, 83 y.o.   MRN: [id number]  HPI Patient here today for follow up on left lower leg cellulitis. She is accompanied today by her son, Juanda Crumble.The patient has had recent lab work. She does have some elevated liver function tests which may in fact be due to the severe fall that she sustained with large hematomas on her legs and arms. We will schedule this ultrasound to make sure everything is stable with the liver. We will repeat liver function test in 2-3 weeks. She will continue to get her pro time monitored here. The patient is also taking Integra +4 her hemoglobin being low and we will repeat a CBC when she gets the repeat liver function tests. The patient's weight is down 4 pounds today with the extra fluid pill. She still has edema in both legs. She is not elevating her feet as much as possible. She is taking iron and the son reports that she may be feeling a little bit better with her energy level. She has not gotten a wheelchair yet. Because of elevated liver function tests she is scheduled to get an ultrasound of her abdomen and liver soon. She denies any shortness of breath or chest pain today. She is concerned about the ongoing edema issues.    Patient Active Problem List   Diagnosis Date Noted  . Secondary pulmonary arterial hypertension ([place]) 07/05/2016  . Dysuria 06/12/2016  . History of cystocele 06/12/2016  . Vocal cord paralysis, unilateral complete   . Vertebrobasilar insufficiency   . Stroke due to embolism of left cerebellar artery ([place])   . Aortic atherosclerosis ([place]) 11/20/2015  . Atrial fibrillation, persistent ([place]) 10/30/2015  . Atrial fibrillation ([place]) 11/23/2014  . Osteoporosis 08/31/2014  . Chronic neck pain 06/07/2014  . DDD (degenerative disc disease), cervical 01/24/2014  . Hyperlipidemia 05/17/2013  . Colitis 01/20/2013  . Generalized anxiety disorder 08/05/2012  . Meningioma ([place])  08/05/2012  . Bruit 10/17/2010  . Essential hypertension, benign 10/04/2009  . MITRAL INSUFFICIENCY 09/26/2009  . Mitral valve disorder 09/26/2009  . Hypertrophic obstructive cardiomyopathy ([place]) 09/26/2009   Outpatient Encounter Prescriptions as of 08/22/2016  Medication Sig  . Cholecalciferol (VITAMIN D3) 2000 UNITS TABS Take 2,000 Units by mouth daily.  Marland Kitchen donepezil (ARICEPT) 10 MG tablet Take 1 tablet (10 mg total) by mouth at bedtime. (Patient taking differently: Take 10 mg by mouth daily. )  . DULoxetine (CYMBALTA) 30 MG capsule Take 30 mg by mouth daily.  Marland Kitchen FeFum-FePoly-FA-B Cmp-C-Biot (INTEGRA PLUS) CAPS Take 1 capsule by mouth daily.  . folic acid (FOLVITE) 1 MG tablet Take 1 mg by mouth daily.  . furosemide (LASIX) 20 MG tablet Take 2 tablets (40 mg total) by mouth 2 (two) times daily. As directed  . metoprolol succinate (TOPROL-XL) 25 MG 24 hr tablet Take 0.5 tablets (12.5 mg total) by mouth daily.  . mupirocin ointment (BACTROBAN) 2 % Place 1 application into the nose 2 (two) times daily.  Marland Kitchen warfarin (COUMADIN) 5 MG tablet Take 1 tablet (5 mg total) by mouth daily.   No facility-administered encounter medications on file as of 08/22/2016.       Review of Systems  Constitutional: Negative.   HENT: Negative.   Eyes: Negative.   Respiratory: Negative.   Cardiovascular: Negative.   Gastrointestinal: Negative.   Endocrine: Negative.   Genitourinary: Negative.   Musculoskeletal: Negative.   Skin: Negative.  Cellulitis - left lower leg Hematoma - left hand  Allergic/Immunologic: Negative.   Neurological: Negative.   Hematological: Negative.   Psychiatric/Behavioral: Negative.        Objective:   Physical Exam  Constitutional: She is oriented to person, place, and time. No distress.  The patient is elderly and somewhat kyphotic using a rolling walker. She also has a weak voice because of her vocal cord issues.  HENT:  Head: Normocephalic and atraumatic.  Eyes:  Conjunctivae and EOM are normal. Pupils are equal, round, and reactive to light. Right eye exhibits no discharge. Left eye exhibits no discharge. No scleral icterus.  Neck: Normal range of motion. Neck supple.  Cardiovascular: Normal rate and regular rhythm.   No murmur heard. Rhythm was regular today at 72/m with no atrial fib being detected.  Pulmonary/Chest: Effort normal and breath sounds normal. No respiratory distress. She has no wheezes. She has no rales.  Clear anteriorly and posteriorly  Musculoskeletal: She exhibits edema. She exhibits no tenderness.  The patient uses a walker for ambulation. She does have presacral edema today.  Neurological: She is alert and oriented to person, place, and time.  Skin: Skin is warm and dry. No rash noted. No erythema.  Psychiatric: She has a normal mood and affect. Her behavior is normal. Judgment and thought content normal.  Nursing note and vitals reviewed.  BP 128/73 (BP Location: Right Arm)   Pulse 69   Temp (!) 96.4 F (35.8 C) (Oral)   Ht 5\' 4"  (1.626 m)   Wt 136 lb (61.7 kg)   LMP 08/01/1982   BMI 23.34 kg/m    The patient was reminded to wear some good commercial support hose and the should be put on with the first thing with arising in the morning. She is also told to stay on her current fluid pill dose She is also asked to elevate her legs and feet higher than the level of her heart for at least 20 minutes twice daily She was reminded to apply a padded dressing to the large hematoma on her left dorsal hand.     Assessment & Plan:  1. Decreased hemoglobin -Continue with iron and drink more fluids - CBC with Differential/Platelet; Future  2. Edema, unspecified type -Elevate feet as much as possible during the day at least for 20 minutes twice daily higher than the level of the heart - Brain natriuretic peptide -Get BMP with hepatic function panel.  3. Elevated LFTs -Get ultrasound as planned - Hepatic function panel;  Future  4. Venous stasis of lower extremity -Elevate feet and wear good commercial support hose that should be applied the first thing in the morning  5. Gait instability -Continue to use the walker so as not to fall  6. Paroxysmal atrial fibrillation (HCC) -Follow-up with pro times as planned  Patient Instructions  Keep feet elevated as much as possible  Return for labs in 3 weeks or so. (CBC, LFT and BMP) We will call you about the labs drawn today.   Arrie Senate MD

## 2016-08-23 LAB — BRAIN NATRIURETIC PEPTIDE: BNP: 1677.3 pg/mL — ABNORMAL HIGH (ref 0.0–100.0)

## 2016-08-23 MED ORDER — METOLAZONE 2.5 MG PO TABS: ORAL_TABLET | ORAL | 0 refills | Status: DC

## 2016-08-23 NOTE — Addendum Note (Signed)
Addended by: Thana Ates on: 08/23/2016 02:16 PM   Modules accepted: Orders

## 2016-08-26 ENCOUNTER — Ambulatory Visit (HOSPITAL_COMMUNITY)
Admission: RE | Admit: 2016-08-26 | Discharge: 2016-08-26 | Disposition: A | Discharge status: Home / Self Care | Payer: Medicare Other | Source: Ambulatory Visit | Attending: Family Medicine | Admitting: Family Medicine

## 2016-08-26 DIAGNOSIS — R7989 Other specified abnormal findings of blood chemistry: Secondary | ICD-10-CM | POA: Diagnosis not present

## 2016-08-26 DIAGNOSIS — R945 Abnormal results of liver function studies: Secondary | ICD-10-CM

## 2016-08-29 ENCOUNTER — Telehealth: Payer: Self-pay | Admitting: Family Medicine

## 2016-08-29 ENCOUNTER — Ambulatory Visit (INDEPENDENT_AMBULATORY_CARE_PROVIDER_SITE_OTHER): Payer: Medicare Other | Admitting: Emergency Medicine

## 2016-08-29 ENCOUNTER — Encounter: Payer: Self-pay | Admitting: Emergency Medicine

## 2016-08-29 DIAGNOSIS — I2721 Secondary pulmonary arterial hypertension: Secondary | ICD-10-CM

## 2016-08-29 NOTE — Assessment & Plan Note (Signed)
No clear role for vasodilator therapy at this time. I do believe we need to make sure she remains adequately oxygenated especially with exertion. I talked to her today about when to use her oxygen, the goal of maintaining SPO2 greater than 88%.

## 2016-08-29 NOTE — Patient Instructions (Signed)
Please work on wearing your oxygen with exertion. Goal to keep your SpO2 > 88% or [place] when you are short of breath.  Continue your other medications as you have been taking them  Follow with Dr Lamonte Sakai in 12 months or sooner if you have any problems

## 2016-08-29 NOTE — Progress Notes (Signed)
Subjective:    Patient ID: Patricia Villa, female    DOB: 1932-07-14, 83 y.o.   MRN: [id number]  HPI 83 year-old never smoker with a history of mitral regurgitation, atrial fibrillation, and associated PAH on TTE  And R heart cath from 01/02/16 that shows  mPAP 34 and large v-wave consistent with her MR. She underwent mitral clipping in 10/17.  Then her f/u TTE 03/18/16 showed evidence for continued severe PAH. She was started on O2 at [facility] after the clipping was done. Her exertional tolerance is very poor. She gets fatigued and dyspneic with any walking.   No hx diet pills, no hx lupus, auto-immune disease.   Also vocal cord paralysis that was dx . She was seen by ENT 2/8 and was scheduled for injection in the OR, scheduled for this month.   She carries a possible hx aspiration based on a review of chart notes. She had a swallow eval at [facility], Dr Madden's office. Didn't have MBS.   ROV 08/29/16 -- Patient has a history of pulmonary hypertension in the setting of mitral regurgitation, atrial fibrillation, chronic hypoxemia. Also with a history of vocal cord paralysis. Recent L LE cellulitis, no evidence LLE DVT. She has o2 that she uses w sleep. She uses w ambulation prn, follows her SpO2.   Review of Systems  Constitutional: Negative.  Negative for fever and unexpected weight change.  HENT: Positive for trouble swallowing. Negative for congestion, dental problem, ear pain, nosebleeds, postnasal drip, rhinorrhea, sinus pressure, sneezing and sore throat.   Eyes: Positive for redness. Negative for itching.  Respiratory: Positive for cough and shortness of breath. Negative for chest tightness and wheezing.   Cardiovascular: Positive for leg swelling. Negative for palpitations.  Gastrointestinal: Negative.  Negative for nausea and vomiting.  Genitourinary: Negative.  Negative for dysuria.  Musculoskeletal: Negative.   Skin: Negative for rash.  Neurological: Negative.  Negative for headaches.   Hematological: Bruises/bleeds easily.  Psychiatric/Behavioral: Negative.  Negative for dysphoric mood. The patient is not nervous/anxious.     Past Medical History:  Diagnosis Date  . Allergy   . Alopecia   . Anxiety   . Arthritis   . Atrial fibrillation, persistent ([place]) 10/30/2015  . Cataract   . DDD (degenerative disc disease), cervical   . Diverticulosis of colon   . Esotropia   . History of double vision   . Hyperlipidemia   . Hypertension   . Hypertrophic cardiomyopathy ([place])    by history but not identified on the most recent echo  . Meningioma (HCC)    cerebellopontine angle   . Mild mitral valve prolapse   . Mitral insufficiency    Severe with anterior flail leaflet.    . Osteoporosis   . Postmenopausal HRT (hormone replacement therapy)   . 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 Vertebrobasilar insufficiency   . Vocal cord paralysis, unilateral complete      Family History  Problem Relation Age of Onset  . Other Brother     IHSS  . COPD Brother   . Diabetes Brother   . Heart disease Brother   . Hyperlipidemia Brother   . Hypertension Brother   . Asthma Mother   . Other Sister     IHSS  . Other Sister     IHSS  . Cancer Cousin     thyroid     Social History   Social History  .  Marital status: Married    Spouse name: N/A  . Number of children: N/A  . Years of education: N/A   Occupational History  . Not on file.   Social History Main Topics  . Smoking status: Never Smoker  . Smokeless tobacco: Never Used     Comment: Rare previously  . Alcohol use No  . Drug use: No  . Sexual activity: No   Other Topics Concern  . Not on file   Social History Narrative  . No narrative on file     Allergies  Allergen Reactions  . Diphenhydramine Hcl Hives  . Livalo [Pitavastatin Calcium] Other (See Comments)    Pt doesn't remember reaction  . Sulfa Antibiotics Other (See Comments)    Pt doesn't  remember reaction  . Zocor [Simvastatin] Other (See Comments)    Pt doesn't remember reaction Leg cramping   . Actonel [Risedronate Sodium] Other (See Comments)    Esophagitis Esophagitis   . Fosamax [Alendronate Sodium] Other (See Comments)    Esophagitis  Esophagitis   . Pravastatin Other (See Comments)    Extreme leg cramping. Extreme leg cramping.     Outpatient Medications Prior to Visit  Medication Sig Dispense Refill  . Cholecalciferol (VITAMIN D3) 2000 UNITS TABS Take 2,000 Units by mouth daily.    Marland Kitchen donepezil (ARICEPT) 10 MG tablet Take 1 tablet (10 mg total) by mouth at bedtime. (Patient taking differently: Take 10 mg by mouth daily. ) 90 tablet 3  . DULoxetine (CYMBALTA) 30 MG capsule Take 30 mg by mouth daily.    Marland Kitchen FeFum-FePoly-FA-B Cmp-C-Biot (INTEGRA PLUS) CAPS Take 1 capsule by mouth daily. 30 capsule 3  . folic acid (FOLVITE) 1 MG tablet Take 1 mg by mouth daily.    . furosemide (LASIX) 20 MG tablet Take 2 tablets (40 mg total) by mouth 2 (two) times daily. As directed 120 tablet 1  . metolazone (ZAROXOLYN) 2.5 MG tablet Take 1 tablet Monday, Wednesday and Friday up to 4 doses 4 tablet 0  . metoprolol succinate (TOPROL-XL) 25 MG 24 hr tablet Take 0.5 tablets (12.5 mg total) by mouth daily. 45 tablet 3  . mupirocin ointment (BACTROBAN) 2 % Place 1 application into the nose 2 (two) times daily. 30 g 1  . warfarin (COUMADIN) 5 MG tablet Take 1 tablet (5 mg total) by mouth daily. 90 tablet 0   No facility-administered medications prior to visit.          Objective:   Physical Exam Vitals:   08/29/16 1039  BP: 126/60  Pulse: 88  SpO2: 94%  Weight: 118 lb (53.5 kg)  Height: 5\' 4"  (1.626 m)   Gen: Pleasant, somewhat ill appearing, in no distress,  normal affect,   ENT: No lesions,  mouth clear,  oropharynx clear, no postnasal drip, hoarse voice  Neck: No JVD, no stridor, no secretions.   Lungs: No use of accessory muscles, clear without rales or  rhonchi  Cardiovascular: RRR, 2/6 M  Musculoskeletal: healing abrasion on L knee, large hematoma on her dorsal L hand from a fall.   Neuro: alert, non focal  Skin: Warm, no lesions or rashes       Assessment & Plan:  Secondary pulmonary arterial hypertension No clear role for vasodilator therapy at this time. I do believe we need to make sure she remains adequately oxygenated especially with exertion. I talked to her today about when to use her oxygen, the goal of maintaining SPO2 greater than 88%.  Baltazar Apo, MD, PhD 08/29/2016, 11:17 AM [facility] [phone] or if no answer [phone]

## 2016-08-29 NOTE — Telephone Encounter (Signed)
Patient son aware of results.

## 2016-09-02 ENCOUNTER — Ambulatory Visit (INDEPENDENT_AMBULATORY_CARE_PROVIDER_SITE_OTHER): Payer: Medicare Other | Admitting: Pharmacist

## 2016-09-02 DIAGNOSIS — I4819 Other persistent atrial fibrillation: Secondary | ICD-10-CM

## 2016-09-02 DIAGNOSIS — I481 Persistent atrial fibrillation: Secondary | ICD-10-CM | POA: Diagnosis not present

## 2016-09-02 DIAGNOSIS — R7989 Other specified abnormal findings of blood chemistry: Secondary | ICD-10-CM

## 2016-09-02 DIAGNOSIS — R710 Precipitous drop in hematocrit: Secondary | ICD-10-CM

## 2016-09-02 DIAGNOSIS — R945 Abnormal results of liver function studies: Secondary | ICD-10-CM

## 2016-09-02 DIAGNOSIS — I480 Paroxysmal atrial fibrillation: Secondary | ICD-10-CM | POA: Diagnosis not present

## 2016-09-02 DIAGNOSIS — R609 Edema, unspecified: Secondary | ICD-10-CM | POA: Diagnosis not present

## 2016-09-02 LAB — COAGUCHEK XS/INR WAIVED
INR: 2.4 — ABNORMAL HIGH (ref 0.9–1.1)
PROTHROMBIN TIME: 28.6 s

## 2016-09-03 LAB — BMP8+EGFR
BUN/Creatinine Ratio: 35 — ABNORMAL HIGH (ref 12–28)
BUN: 29 mg/dL — ABNORMAL HIGH (ref 8–27)
CALCIUM: 9.7 mg/dL (ref 8.7–10.3)
CO2: 29 mmol/L (ref 18–29)
CREATININE: 0.82 mg/dL (ref 0.57–1.00)
Chloride: 88 mmol/L — ABNORMAL LOW (ref 96–106)
GFR calc Af Amer: 78 mL/min/{1.73_m2} (ref >59)
GFR calc non Af Amer: 67 mL/min/{1.73_m2} (ref >59)
Glucose: 105 mg/dL — ABNORMAL HIGH (ref 65–99)
Potassium: 3.6 mmol/L (ref 3.5–5.2)
SODIUM: 134 mmol/L (ref 134–144)

## 2016-09-03 LAB — CBC WITH DIFFERENTIAL/PLATELET
BASOS ABS: 0.1 10*3/uL (ref 0.0–0.2)
BASOS: 1 %
EOS (ABSOLUTE): 0.2 10*3/uL (ref 0.0–0.4)
Eos: 3 %
Hematocrit: 36.8 % (ref 34.0–46.6)
Hemoglobin: 11.1 g/dL (ref 11.1–15.9)
IMMATURE GRANS (ABS): 0 10*3/uL (ref 0.0–0.1)
Immature Granulocytes: 0 %
Lymphocytes Absolute: 1.6 10*3/uL (ref 0.7–3.1)
Lymphs: 27 %
MCH: 27 pg (ref 26.6–33.0)
MCHC: 30.2 g/dL — ABNORMAL LOW (ref 31.5–35.7)
MCV: 90 fL (ref 79–97)
Monocytes Absolute: 1 10*3/uL — ABNORMAL HIGH (ref 0.1–0.9)
Monocytes: 17 %
NEUTROS ABS: 3.1 10*3/uL (ref 1.4–7.0)
Neutrophils: 52 %
PLATELETS: 300 10*3/uL (ref 150–379)
RBC: 4.11 x10E6/uL (ref 3.77–5.28)
RDW: 25.6 % — AB (ref 12.3–15.4)
WBC: 6 10*3/uL (ref 3.4–10.8)

## 2016-09-03 LAB — HEPATIC FUNCTION PANEL
ALT: 38 IU/L — ABNORMAL HIGH (ref 0–32)
AST: 62 IU/L — ABNORMAL HIGH (ref 0–40)
Albumin: 3.9 g/dL (ref 3.5–4.7)
Alkaline Phosphatase: 96 IU/L (ref 39–117)
BILIRUBIN TOTAL: 0.9 mg/dL (ref 0.0–1.2)
BILIRUBIN, DIRECT: 0.45 mg/dL — AB (ref 0.00–0.40)
TOTAL PROTEIN: 7.7 g/dL (ref 6.0–8.5)

## 2016-09-10 ENCOUNTER — Ambulatory Visit: Payer: Medicare Other | Admitting: Family Medicine

## 2016-09-10 ENCOUNTER — Telehealth: Payer: Self-pay | Admitting: Pharmacist

## 2016-09-10 NOTE — Telephone Encounter (Signed)
No OK to wait until appt with PCP 09/18/16

## 2016-09-11 ENCOUNTER — Ambulatory Visit (INDEPENDENT_AMBULATORY_CARE_PROVIDER_SITE_OTHER): Payer: Medicare Other | Admitting: Family Medicine

## 2016-09-11 ENCOUNTER — Encounter: Payer: Self-pay | Admitting: Family Medicine

## 2016-09-11 VITALS — BP 113/58 | HR 68 | Temp 97.3°F | Ht 64.0 in | Wt 125.0 lb

## 2016-09-11 DIAGNOSIS — S60222D Contusion of left hand, subsequent encounter: Secondary | ICD-10-CM | POA: Diagnosis not present

## 2016-09-11 DIAGNOSIS — I739 Peripheral vascular disease, unspecified: Secondary | ICD-10-CM

## 2016-09-11 DIAGNOSIS — L03116 Cellulitis of left lower limb: Secondary | ICD-10-CM

## 2016-09-11 MED ORDER — DOXYCYCLINE MONOHYDRATE 100 MG PO TABS: 100.0000 mg | ORAL_TABLET | Freq: Two times a day (BID) | ORAL | 0 refills | Status: DC

## 2016-09-11 NOTE — Progress Notes (Signed)
BP (!) 113/58   Pulse 68   Temp 97.3 F (36.3 C) (Oral)   Ht 5\' 4"  (1.626 m)   Wt 125 lb (56.7 kg)   LMP 08/01/1982   BMI 21.46 kg/m    Subjective:    Patient ID: Patricia Villa, female    DOB: 03-15-1933, 83 y.o.   MRN: [id number]  HPI: Patricia Villa is a 83 y.o. female presenting on 09/11/2016 for Redness and swelling in legs (bilateral, left worse than right)   HPI  Lower Left Leg Pain Patient reports pain and redness to left lower leg that began 3-4 weeks ago after an abrasion to her left knee suffered in a fall 5 weeks ago. Patient states that she was seen for this problem and treated with Doxycycline 2-3 weeks ago but did not finish her course. Patient states that swelling, pain and redness has improved with antibiotic use but persists. Patient describes pain as stinging and severe, worse with movement and touching the area. Pain is not currently relieved by anything. Patient is able to ambulate with some pain.  Swelling of Left Hand Patient reports large, dark blue knot on dorsal aspect of left hand sustained after she struck her hand on bed while sleeping 3 weeks ago. Patient states that area is painful and has not improved. Patient states that she has not tried anything as treatment as that she is on Coumadin.   Relevant past medical, surgical, family and social history reviewed and updated as indicated. Interim medical history since our last visit reviewed. Allergies and medications reviewed and updated.  Review of Systems  Constitutional: Negative.  Negative for chills, fatigue and fever.  HENT: Negative.  Negative for congestion, rhinorrhea, sinus pressure and sore throat.   Eyes: Negative.  Negative for pain and discharge.  Respiratory: Negative.  Negative for cough, shortness of breath, wheezing and stridor.   Cardiovascular: Negative.  Negative for chest pain.  Gastrointestinal: Negative.  Negative for constipation, diarrhea, nausea and vomiting.  Endocrine:  Negative.   Genitourinary: Negative.  Negative for dysuria and hematuria.  Musculoskeletal: Negative.  Negative for arthralgias and myalgias.  Skin: Positive for rash.  Allergic/Immunologic: Negative.   Neurological: Negative.  Negative for dizziness, weakness, numbness and headaches.  Hematological: Negative.     Per HPI unless specifically indicated above      Objective:    BP (!) 113/58   Pulse 68   Temp 97.3 F (36.3 C) (Oral)   Ht 5\' 4"  (1.626 m)   Wt 125 lb (56.7 kg)   LMP 08/01/1982   BMI 21.46 kg/m   Wt Readings from Last 3 Encounters:  09/11/16 125 lb (56.7 kg)  09/02/16 119 lb (54 kg)  08/29/16 118 lb (53.5 kg)    Physical Exam  Constitutional: She is oriented to person, place, and time. She appears well-developed and well-nourished. No distress.  HENT:  Head: Normocephalic and atraumatic.  Right Ear: External ear normal.  Left Ear: External ear normal.  Eyes: Conjunctivae and EOM are normal. Pupils are equal, round, and reactive to light. Right eye exhibits no discharge. Left eye exhibits no discharge.  Neck: Normal range of motion.  Cardiovascular: Normal rate, regular rhythm, normal heart sounds and intact distal pulses.  Exam reveals no gallop and no friction rub.   No murmur heard. Pulmonary/Chest: Effort normal and breath sounds normal. No respiratory distress. She has no wheezes. She has no rales.  Abdominal: Soft. There is no tenderness.  Musculoskeletal: Normal range  of motion.  Neurological: She is alert and oriented to person, place, and time. No cranial nerve deficit.  Skin: Rash noted. She is not diaphoretic. There is erythema.     Negative homan's sign bilaterally. Pedal pulses weak bilaterally. No palpable cords present. Capillary refill intact  Psychiatric: She has a normal mood and affect. Her behavior is normal.        Assessment & Plan:   Problem List Items Addressed This Visit    None    Visit Diagnoses    Left leg cellulitis     -  Primary   Relevant Medications   doxycycline (ADOXA) 100 MG tablet   Traumatic hematoma of left hand, subsequent encounter       Patient says that it's been there for quite a few months, continue to monitor   Arterial insufficiency of lower extremity [facility])         Patient seen and examined with Nuala Alpha PA student. Agree with assessment and plan above, was discussed extensively with the patient and with the PA student. Will do doxycycline and nurse placed an Haematologist. Have her come back in one week and get her INR rechecked.   Follow up plan: Return in about 1 week (around 09/18/2016), or if symptoms worsen or fail to improve, for Has appointment with Dr. Laurance Flatten in 1 week, keep it and recheck INR on that date.  Counseling provided for all of the vaccine components No orders of the defined types were placed in this encounter.

## 2016-09-12 ENCOUNTER — Telehealth: Payer: Self-pay | Admitting: Pharmacist

## 2016-09-12 NOTE — Telephone Encounter (Signed)
Patient was started on doxycycline yesterday and takes warfarin.  Called patient's son Doren Custard to recommend that on Saturday and Sunday of this week take only 1/2 tablet instead of 1 tablet.   Then resume usual warfarin dose.  Will check INR as planned Wednesday, May 16th.

## 2016-09-17 ENCOUNTER — Other Ambulatory Visit: Payer: Self-pay | Admitting: Family Medicine

## 2016-09-18 ENCOUNTER — Ambulatory Visit (INDEPENDENT_AMBULATORY_CARE_PROVIDER_SITE_OTHER): Payer: Medicare Other | Admitting: Family Medicine

## 2016-09-18 ENCOUNTER — Encounter: Payer: Self-pay | Admitting: Family Medicine

## 2016-09-18 VITALS — BP 117/68 | HR 74 | Temp 96.6°F | Ht 64.0 in | Wt 124.0 lb

## 2016-09-18 DIAGNOSIS — R945 Abnormal results of liver function studies: Secondary | ICD-10-CM

## 2016-09-18 DIAGNOSIS — I481 Persistent atrial fibrillation: Secondary | ICD-10-CM | POA: Diagnosis not present

## 2016-09-18 DIAGNOSIS — I4819 Other persistent atrial fibrillation: Secondary | ICD-10-CM

## 2016-09-18 DIAGNOSIS — R7989 Other specified abnormal findings of blood chemistry: Secondary | ICD-10-CM | POA: Diagnosis not present

## 2016-09-18 DIAGNOSIS — L03116 Cellulitis of left lower limb: Secondary | ICD-10-CM | POA: Diagnosis not present

## 2016-09-18 DIAGNOSIS — S60222D Contusion of left hand, subsequent encounter: Secondary | ICD-10-CM

## 2016-09-18 DIAGNOSIS — I421 Obstructive hypertrophic cardiomyopathy: Secondary | ICD-10-CM

## 2016-09-18 DIAGNOSIS — R609 Edema, unspecified: Secondary | ICD-10-CM | POA: Diagnosis not present

## 2016-09-18 LAB — COAGUCHEK XS/INR WAIVED
INR: 1.3 — ABNORMAL HIGH (ref 0.9–1.1)
Prothrombin Time: 15.3 s

## 2016-09-18 NOTE — Patient Instructions (Signed)
Continue to use Ace bandage on left lower extremity and applying this the first thing we getting out of bed in the morning Continue to use an Ace bandage on left dorsal hand to keep from irritating the hematoma that is resolving. Continue to drink plenty of fluids and stay well hydrated Use Bactroban ointment in the nose at least twice daily and use nasal saline during the day frequently and do not pick at the nose.

## 2016-09-18 NOTE — Progress Notes (Signed)
Subjective:    Patient ID: Patricia Villa, female    DOB: 1932/10/06, 83 y.o.   MRN: [id number]  HPI Pt here for 4 week follow up on left lower leg cellulitis. The patient comes today with her son Arnette Norris. The patient has had some liver function elevation. She did get an ultrasound of the abdomen which was unremarkable. The patient's hemoglobin has improved and most recently was good with a hemoglobin of 11.1. The white blood cell count was not elevated at that time. Her most recent electrolytes were also improved other than a slightly low chloride. The patient's recent lab work was reviewed. This was also reviewed with the patient and her son. We will not do any additional lab work today. She has a nasal septal irritation and a slow to resolve left dorsal hand hematoma. She also has had this chronic edema of the left lower leg and cellulitis. This is much much better. We will ask her just to use an Ace bandage at this point and we'll get a call from the son in the next week to 10 days if the edema recurs. She denies any other complaints. No chest pain or shortness of breath or trouble with her bowels moving or passing her water.    Patient Active Problem List   Diagnosis Date Noted  . Secondary pulmonary arterial hypertension ([place]) 07/05/2016  . Dysuria 06/12/2016  . History of cystocele 06/12/2016  . Vocal cord paralysis, unilateral complete   . Vertebrobasilar insufficiency   . Stroke due to embolism of left cerebellar artery ([place])   . Aortic atherosclerosis ([place]) 11/20/2015  . Atrial fibrillation, persistent ([place]) 10/30/2015  . Atrial fibrillation ([place]) 11/23/2014  . Osteoporosis 08/31/2014  . Chronic neck pain 06/07/2014  . DDD (degenerative disc disease), cervical 01/24/2014  . Hyperlipidemia 05/17/2013  . Colitis 01/20/2013  . Generalized anxiety disorder 08/05/2012  . Meningioma ([place]) 08/05/2012  . Bruit 10/17/2010  . Essential hypertension, benign 10/04/2009  . MITRAL  INSUFFICIENCY 09/26/2009  . Mitral valve disorder 09/26/2009  . Hypertrophic obstructive cardiomyopathy ([place]) 09/26/2009   Outpatient Encounter Prescriptions as of 09/18/2016  Medication Sig  . Cholecalciferol (VITAMIN D3) 2000 UNITS TABS Take 2,000 Units by mouth daily.  Marland Kitchen donepezil (ARICEPT) 10 MG tablet Take 1 tablet (10 mg total) by mouth at bedtime. (Patient taking differently: Take 10 mg by mouth daily. )  . doxycycline (ADOXA) 100 MG tablet Take 1 tablet (100 mg total) by mouth 2 (two) times daily.  . DULoxetine (CYMBALTA) 30 MG capsule TAKE ONE CAPSULE BY MOUTH ONCE DAILY  . FeFum-FePoly-FA-B Cmp-C-Biot (INTEGRA PLUS) CAPS Take 1 capsule by mouth daily.  . folic acid (FOLVITE) 1 MG tablet Take 1 mg by mouth daily.  . furosemide (LASIX) 20 MG tablet Take 2 tablets (40 mg total) by mouth 2 (two) times daily. As directed  . metolazone (ZAROXOLYN) 2.5 MG tablet Take 1 tablet Monday, Wednesday and Friday up to 4 doses  . metoprolol succinate (TOPROL-XL) 25 MG 24 hr tablet Take 0.5 tablets (12.5 mg total) by mouth daily.  . mupirocin ointment (BACTROBAN) 2 % Place 1 application into the nose 2 (two) times daily.  Marland Kitchen warfarin (COUMADIN) 5 MG tablet Take 1 tablet (5 mg total) by mouth daily.  . [DISCONTINUED] DULoxetine (CYMBALTA) 30 MG capsule Take 30 mg by mouth daily.   No facility-administered encounter medications on file as of 09/18/2016.       Review of Systems  Constitutional: Negative.   HENT: Negative.  Eyes: Negative.   Respiratory: Negative.   Cardiovascular: Negative.   Gastrointestinal: Negative.   Endocrine: Negative.   Genitourinary: Negative.   Musculoskeletal: Negative.   Skin: Negative.        Left lower leg cellulitis Hematoma left hand  Allergic/Immunologic: Negative.   Neurological: Negative.   Hematological: Negative.   Psychiatric/Behavioral: Negative.        Objective:   Physical Exam  Constitutional: She is oriented to person, place, and time. No  distress.  Elderly and somewhat frail appearing but fairly alert today during the visit and responding appropriately to questions asked of her.  HENT:  Head: Normocephalic.  Right Ear: External ear normal.  Left Ear: External ear normal.  Mouth/Throat: Oropharynx is clear and moist.  The patient has a nasal septal irritation with eschar on the right nasal septum. She was instructed to keep and using the Bactroban ointment as well as nasal saline during the day.  Eyes: Conjunctivae and EOM are normal. Pupils are equal, round, and reactive to light. Right eye exhibits no discharge. Left eye exhibits no discharge. No scleral icterus.  Neck: Normal range of motion. Neck supple. No thyromegaly present.  Cardiovascular: Normal rate, regular rhythm and normal heart sounds.   No murmur heard. The heart was irregular at about 60/m.  Pulmonary/Chest: Effort normal and breath sounds normal. No respiratory distress. She has no wheezes. She has no rales.  Abdominal: Soft. Bowel sounds are normal. She exhibits no mass. There is no tenderness. There is no rebound and no guarding.  Musculoskeletal: Normal range of motion. She exhibits no edema.  The left lower extremity was much improved with no redness drainage and the Unna boot was removed and the Ace bandage was replaced and the son was instructed on how to apply this.  Lymphadenopathy:    She has no cervical adenopathy.  Neurological: She is alert and oriented to person, place, and time.  Skin: Skin is warm and dry. No rash noted.  The hematoma on the left dorsal hand appeared to be improving and drying up. It was recommended that she use a Ace bandage on this during the day to keep from irritating it are picking at it.  Psychiatric: She has a normal mood and affect. Her behavior is normal. Judgment and thought content normal.  Nursing note and vitals reviewed.   BP 117/68 (BP Location: Left Arm)   Pulse 74   Temp (!) 96.6 F (35.9 C) (Oral)   Ht  5\' 4"  (1.626 m)   Wt 124 lb (56.2 kg)   LMP 08/01/1982   BMI 21.28 kg/m        Assessment & Plan:  1. Persistent atrial fibrillation (HCC) -Continue with Coumadin as directed by clinical pharmacy as blood was too thick today. - CoaguChek XS/INR Waived  2. Left leg cellulitis -Leave off the Unna boot and go with a Ace bandage that is to be applied the first thing with getting out of the bed in the morning  3. Elevated LFTs -Recheck liver function test in 4 weeks -Recheck CBC in 4 weeks as well as BMP  4. Edema, unspecified type -Continue with Ace bandage and elevation of left leg as much as possible  5. Cardiomyopathy, hypertrophic obstructive ([place]) -Follow-up with cardiology as planned  6. Atrial fibrillation, persistent ([place]) -Continue with Coumadin  7. Paroxysmal hematoma of left hand, subsequent encounter -Use Ace bandage on left hand so as that patient will not pick at the resolving hematoma and scar.  Patient Instructions  Continue to use Ace bandage on left lower extremity and applying this the first thing we getting out of bed in the morning Continue to use an Ace bandage on left dorsal hand to keep from irritating the hematoma that is resolving. Continue to drink plenty of fluids and stay well hydrated Use Bactroban ointment in the nose at least twice daily and use nasal saline during the day frequently and do not pick at the nose.   Arrie Senate MD

## 2016-09-20 ENCOUNTER — Ambulatory Visit: Payer: Medicare Other | Admitting: Family Medicine

## 2016-09-22 ENCOUNTER — Other Ambulatory Visit: Payer: Self-pay | Admitting: Family Medicine

## 2016-09-24 ENCOUNTER — Ambulatory Visit: Payer: Medicare Other | Attending: Pediatrics | Admitting: Physical Therapy

## 2016-09-24 DIAGNOSIS — R2681 Unsteadiness on feet: Secondary | ICD-10-CM | POA: Diagnosis not present

## 2016-09-24 DIAGNOSIS — R2689 Other abnormalities of gait and mobility: Secondary | ICD-10-CM | POA: Diagnosis not present

## 2016-09-24 DIAGNOSIS — R293 Abnormal posture: Secondary | ICD-10-CM | POA: Diagnosis not present

## 2016-09-24 NOTE — Therapy (Signed)
[facility] [address] [place], [place], [address] Phone: [phone]   Fax:  [phone]  Physical Therapy Evaluation  Patient Details  Name: Patricia Villa MRN: [id number] Date of Birth: [birth year] Referring Provider: Rex Kras MD.  Encounter Date: 09/24/2016      PT End of Session - 09/24/16 1522    Visit Number 1   Number of Visits 16   Date for PT Re-Evaluation 11/23/16   PT Start Time 0150   PT Stop Time 0222   PT Time Calculation (min) 32 min   Activity Tolerance Patient tolerated treatment well   Behavior During Therapy [facility] for tasks assessed/performed      Past Medical History:  Diagnosis Date  . Allergy   . Alopecia   . Anxiety   . Arthritis   . Atrial fibrillation, persistent (HCC) 10/30/2015  . Cataract   . DDD (degenerative disc disease), cervical   . Diverticulosis of colon   . Esotropia   . History of double vision   . Hyperlipidemia   . Hypertension   . Hypertrophic cardiomyopathy (HCC)    by history but not identified on the most recent echo  . Meningioma (HCC)    cerebellopontine angle   . Mild mitral valve prolapse   . Mitral insufficiency    Severe with anterior flail leaflet.    . Osteoporosis   . Postmenopausal HRT (hormone replacement therapy)   . 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 Vertebrobasilar insufficiency   . Vocal cord paralysis, unilateral complete     Past Surgical History:  Procedure Laterality Date  . ABDOMINAL HYSTERECTOMY    . CARDIAC CATHETERIZATION N/A 01/02/2016   Procedure: Right/Left Heart Cath and Coronary Angiography;  Surgeon: Tonny Bollman, MD;  Location: [facility] INVASIVE CV LAB;  Service: Cardiovascular;  Laterality: N/A;  . CATARACT EXTRACTION, BILATERAL    . EYE SURGERY    . TEE WITHOUT CARDIOVERSION N/A 12/12/2015   Procedure: TRANSESOPHAGEAL ECHOCARDIOGRAM (TEE);  Surgeon: Lewayne Bunting, MD;  Location: [facility]  ENDOSCOPY;  Service: Cardiovascular;  Laterality: N/A;  . TONSILLECTOMY    . VEIN SURGERY      There were no vitals filed for this visit.       Subjective Assessment - 09/24/16 1508    Pertinent History Atrial fib; stroke (silent); OP.            [facility] PT Assessment - 09/24/16 0001      Assessment   Medical Diagnosis Frequent falls.   Referring Provider Rex Kras MD.   Onset Date/Surgical Date --  Ongoing.     Precautions   Precautions Fall   Precaution Comments --  Use portable 02.     Restrictions   Weight Bearing Restrictions No     Balance Screen   Has the patient fallen in the past 6 months Yes   How many times? --  4.   Has the patient had a decrease in activity level because of a fear of falling?  Yes   Is the patient reluctant to leave their home because of a fear of falling?  No     Home [employer]ist information centre manager residence   Living Arrangements --  Caregiver.     Prior Function   Level of Independence Independent with household mobility with device;Needs assistance with ADLs;Needs assistance with homemaking   Vocation Requirements --  Retired.  Cognition   Behaviors --  Easily followed simple commands.     Posture/Postural Control   Posture/Postural Control Postural limitations   Postural Limitations Rounded Shoulders;Forward head;Decreased lumbar lordosis;Increased thoracic kyphosis;Flexed trunk;Weight shift right   Posture Comments Scoliosis, left rib hump and right trunk lean.     ROM / Strength   AROM / PROM / Strength AROM;Strength     AROM   Overall AROM Comments WFL for bilateral LE's though patient was reluctant to move left ankle due to pain and cellulitis.     Strength   Overall Strength Comments Blateral hip strength= 4/5, bilateral knee strength= 4+/5; right ankle strength= 4+/5; left ankle NT due to pain.     Palpation   Palpation comment Left ankle wrapped with ACE wrap.  left heel tender.     Special  Tests    Special Tests --  Positive Romberg test.     Transfers   Five time sit to stand comments  --  With hands pushing from armrests and supervision.     Ambulation/Gait   Gait Pattern Decreased step length - right;Decreased step length - left;Decreased stride length;Decreased hip/knee flexion - right;Decreased hip/knee flexion - left;Trunk flexed   Gait Comments Patient using a SW with SBA provided today.     Standardized Balance Assessment   Standardized Balance Assessment Berg Balance Test     Berg Balance Test   Sit to Stand Able to stand  independently using hands   Standing Unsupported Able to stand 30 seconds unsupported   Sitting with Back Unsupported but Feet Supported on Floor or Stool Able to sit safely and securely 2 minutes   Stand to Sit Controls descent by using hands   Transfers Able to transfer safely, definite need of hands   Standing Unsupported with Eyes Closed Unable to keep eyes closed 3 seconds but stays steady   Standing Ubsupported with Feet Together Needs help to attain position and unable to hold for 15 seconds   From Standing, Reach Forward with Outstretched Arm Can reach forward >12 cm safely (5")   From Standing Position, Pick up Object from Floor Able to pick up shoe, needs supervision   From Standing Position, Turn to Look Behind Over each Shoulder Looks behind one side only/other side shows less weight shift   Turn 360 Degrees Able to turn 360 degrees safely but slowly   Standing Unsupported, Alternately Place Feet on Step/Stool Able to complete >2 steps/needs minimal assist   Standing Unsupported, One Foot in Front Needs help to step but can hold 15 seconds   Standing on One Leg Unable to try or needs assist to prevent fall   Total Score 29                             PT Short Term Goals - 09/24/16 1607      PT SHORT TERM GOAL #1   Title Independent with a HEP.   Time 4   Period Weeks   Status New     PT SHORT TERM  GOAL #2   Title Berg score= 37/56.           PT Long Term Goals - 09/24/16 1608      PT LONG TERM GOAL #1   Title Independent with an advanced HEP.   Time 8   Period Weeks   Status New     PT LONG TERM GOAL #2   Title  Improve Berg score to 45-46/56.               Plan - 24-Oct-2016 1559    Clinical Impression Statement The patient presents with worsening balance and 4 falls over the last  6 month.  Her berg score is a 29/56.  The patient will bendefit from skilled physical therapy to include gait and balance activites.   Rehab Potential Good   PT Frequency 2x / week   PT Duration 8 weeks   PT Treatment/Interventions ADLs/Self Care Home Management;Gait training;Functional mobility training;Therapeutic activities;Therapeutic exercise;Balance training;Neuromuscular re-education   PT Next Visit Plan Gait and balance activites.   Consulted and Agree with Plan of Care Patient      Patient will benefit from skilled therapeutic intervention in order to improve the following deficits and impairments:  Abnormal gait, Decreased activity tolerance, Decreased balance, Decreased mobility  Visit Diagnosis: Unsteadiness on feet - Plan: PT plan of care cert/re-cert      G-Codes - October 24, 2016 1505    Functional Assessment Tool Used (Outpatient Only) Clinical judgement.   Functional Limitation Mobility: Walking and moving around   Mobility: Walking and Moving Around Current Status [phone]) At least 40 percent but less than 60 percent impaired, limited or restricted   Mobility: Walking and Moving Around Goal Status [phone]) At least 20 percent but less than 40 percent impaired, limited or restricted       Problem List Patient Active Problem List   Diagnosis Date Noted  . Secondary pulmonary arterial hypertension (HCC) 07/05/2016  . Dysuria 06/12/2016  . History of cystocele 06/12/2016  . Vocal cord paralysis, unilateral complete   . Vertebrobasilar insufficiency   . Stroke due to  embolism of left cerebellar artery (HCC)   . Aortic atherosclerosis (HCC) 11/20/2015  . Atrial fibrillation, persistent (HCC) 10/30/2015  . Atrial fibrillation (HCC) 11/23/2014  . Osteoporosis 08/31/2014  . Chronic neck pain 06/07/2014  . DDD (degenerative disc disease), cervical 01/24/2014  . Hyperlipidemia 05/17/2013  . Colitis 01/20/2013  . Generalized anxiety disorder 08/05/2012  . Meningioma (HCC) 08/05/2012  . Bruit 10/17/2010  . Essential hypertension, benign 10/04/2009  . MITRAL INSUFFICIENCY 09/26/2009  . Mitral valve disorder 09/26/2009  . Hypertrophic obstructive cardiomyopathy (HCC) 09/26/2009    Kaaliyah Kita, [place] MPT 2016-10-24, 4:29 PM  [facility] [address] [place], [place], [address] Phone: [phone]   Fax:  [phone]  Name: Patricia Villa MRN: [id number] Date of Birth: 09/04/1932

## 2016-09-25 ENCOUNTER — Ambulatory Visit (INDEPENDENT_AMBULATORY_CARE_PROVIDER_SITE_OTHER): Payer: Medicare Other | Admitting: Pharmacist

## 2016-09-25 DIAGNOSIS — I480 Paroxysmal atrial fibrillation: Secondary | ICD-10-CM | POA: Diagnosis not present

## 2016-09-25 DIAGNOSIS — I481 Persistent atrial fibrillation: Secondary | ICD-10-CM

## 2016-09-25 DIAGNOSIS — I4819 Other persistent atrial fibrillation: Secondary | ICD-10-CM

## 2016-09-25 LAB — COAGUCHEK XS/INR WAIVED
INR: 2.1 — ABNORMAL HIGH (ref 0.9–1.1)
PROTHROMBIN TIME: 25 s

## 2016-09-26 ENCOUNTER — Encounter: Payer: Medicare Other | Admitting: Physical Therapy

## 2016-10-01 ENCOUNTER — Ambulatory Visit: Payer: Medicare Other | Admitting: Physical Therapy

## 2016-10-01 DIAGNOSIS — R2681 Unsteadiness on feet: Secondary | ICD-10-CM

## 2016-10-01 DIAGNOSIS — R293 Abnormal posture: Secondary | ICD-10-CM

## 2016-10-01 DIAGNOSIS — I482 Chronic atrial fibrillation: Secondary | ICD-10-CM | POA: Diagnosis not present

## 2016-10-01 DIAGNOSIS — E119 Type 2 diabetes mellitus without complications: Secondary | ICD-10-CM | POA: Diagnosis not present

## 2016-10-01 DIAGNOSIS — I4891 Unspecified atrial fibrillation: Secondary | ICD-10-CM | POA: Diagnosis not present

## 2016-10-01 DIAGNOSIS — R040 Epistaxis: Secondary | ICD-10-CM | POA: Diagnosis not present

## 2016-10-01 DIAGNOSIS — R2689 Other abnormalities of gait and mobility: Secondary | ICD-10-CM

## 2016-10-01 DIAGNOSIS — D6832 Hemorrhagic disorder due to extrinsic circulating anticoagulants: Secondary | ICD-10-CM | POA: Diagnosis not present

## 2016-10-01 DIAGNOSIS — I10 Essential (primary) hypertension: Secondary | ICD-10-CM | POA: Diagnosis not present

## 2016-10-01 DIAGNOSIS — Z7901 Long term (current) use of anticoagulants: Secondary | ICD-10-CM | POA: Diagnosis not present

## 2016-10-01 NOTE — Therapy (Signed)
[facility] [address] [place], [place], [address] Phone: [phone]   Fax:  [phone]  Physical Therapy Treatment  Patient Details  Name: Patricia Villa MRN: [id number] Date of Birth: [birth year] Referring Provider: Rex Kras MD.  Encounter Date: 10/01/2016      PT End of Session - 10/01/16 1544    Visit Number 2   Number of Visits 16   Date for PT Re-Evaluation 11/23/16      Past Medical History:  Diagnosis Date  . Allergy   . Alopecia   . Anxiety   . Arthritis   . Atrial fibrillation, persistent (HCC) 10/30/2015  . Cataract   . DDD (degenerative disc disease), cervical   . Diverticulosis of colon   . Esotropia   . History of double vision   . Hyperlipidemia   . Hypertension   . Hypertrophic cardiomyopathy (HCC)    by history but not identified on the most recent echo  . Meningioma (HCC)    cerebellopontine angle   . Mild mitral valve prolapse   . Mitral insufficiency    Severe with anterior flail leaflet.    . Osteoporosis   . Postmenopausal HRT (hormone replacement therapy)   . 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 Vertebrobasilar insufficiency   . Vocal cord paralysis, unilateral complete     Past Surgical History:  Procedure Laterality Date  . ABDOMINAL HYSTERECTOMY    . CARDIAC CATHETERIZATION N/A 01/02/2016   Procedure: Right/Left Heart Cath and Coronary Angiography;  Surgeon: Tonny Bollman, MD;  Location: [facility] INVASIVE CV LAB;  Service: Cardiovascular;  Laterality: N/A;  . CATARACT EXTRACTION, BILATERAL    . EYE SURGERY    . TEE WITHOUT CARDIOVERSION N/A 12/12/2015   Procedure: TRANSESOPHAGEAL ECHOCARDIOGRAM (TEE);  Surgeon: Lewayne Bunting, MD;  Location: [facility] ENDOSCOPY;  Service: Cardiovascular;  Laterality: N/A;  . TONSILLECTOMY    . VEIN SURGERY      There were no vitals filed for this visit.                                 PT Short Term Goals - 09/24/16 1607      PT SHORT TERM GOAL #1   Title Independent with a HEP.   Time 4   Period Weeks   Status New     PT SHORT TERM GOAL #2   Title Berg score= 37/56.           PT Long Term Goals - 09/24/16 1608      PT LONG TERM GOAL #1   Title Independent with an advanced HEP.   Time 8   Period Weeks   Status New     PT LONG TERM GOAL #2   Title Improve Berg score to 45-46/56.             Patient will benefit from skilled therapeutic intervention in order to improve the following deficits and impairments:     Visit Diagnosis: Unsteadiness on feet  Other abnormalities of gait and mobility  Abnormal posture     Problem List Patient Active Problem List   Diagnosis Date Noted  . Secondary pulmonary arterial hypertension (HCC) 07/05/2016  . Dysuria 06/12/2016  . History of cystocele 06/12/2016  . Vocal cord paralysis, unilateral complete   . Vertebrobasilar insufficiency   . Stroke due to embolism of  left cerebellar artery (HCC)   . Aortic atherosclerosis (HCC) 11/20/2015  . Atrial fibrillation, persistent (HCC) 10/30/2015  . Atrial fibrillation (HCC) 11/23/2014  . Osteoporosis 08/31/2014  . Chronic neck pain 06/07/2014  . DDD (degenerative disc disease), cervical 01/24/2014  . Hyperlipidemia 05/17/2013  . Colitis 01/20/2013  . Generalized anxiety disorder 08/05/2012  . Meningioma (HCC) 08/05/2012  . Bruit 10/17/2010  . Essential hypertension, benign 10/04/2009  . MITRAL INSUFFICIENCY 09/26/2009  . Mitral valve disorder 09/26/2009  . Hypertrophic obstructive cardiomyopathy (HCC) 09/26/2009     ** Patient completed just 5 minutes on the Nustep at level 2 before stopping due to c/o left leg pain.  The patient did seem confused today.  She has been seen in the past by her MD for leg pain which included an X-ray (negative) and treatment for cellulitis.  Her 02 sat was 95%.    Illene Sweeting, [place] MPT 10/01/2016, 3:45 PM  [facility] [address] [place], [place], [address] Phone: [phone]   Fax:  [phone]  Name: Patricia Villa MRN: [id number] Date of Birth: 05-09-1932

## 2016-10-03 ENCOUNTER — Ambulatory Visit (INDEPENDENT_AMBULATORY_CARE_PROVIDER_SITE_OTHER): Payer: Medicare Other | Admitting: Pharmacist

## 2016-10-03 ENCOUNTER — Other Ambulatory Visit: Payer: Self-pay

## 2016-10-03 ENCOUNTER — Telehealth: Payer: Self-pay | Admitting: Family Medicine

## 2016-10-03 DIAGNOSIS — I480 Paroxysmal atrial fibrillation: Secondary | ICD-10-CM

## 2016-10-03 DIAGNOSIS — I4819 Other persistent atrial fibrillation: Secondary | ICD-10-CM

## 2016-10-03 DIAGNOSIS — R609 Edema, unspecified: Secondary | ICD-10-CM

## 2016-10-03 LAB — COAGUCHEK XS/INR WAIVED
INR: 2.2 — AB (ref 0.9–1.1)
Prothrombin Time: 26.4 s

## 2016-10-03 MED ORDER — TRIAMCINOLONE 0.1 % CREAM:EUCERIN CREAM 1:1: 1.0000 "application " | TOPICAL_CREAM | Freq: Two times a day (BID) | CUTANEOUS | 0 refills | Status: DC

## 2016-10-03 NOTE — Addendum Note (Signed)
Addended by: Cherre Robins B on: 10/03/2016 04:34 PM   Modules accepted: Level of Service

## 2016-10-03 NOTE — Progress Notes (Signed)
Patient ID: Patricia Villa, female   DOB: [birth year], 83 y.o.   MRN: [id number]   Subjective:     Indication: atrial fibrillation Bleeding signs/symptoms: Yes - patient presented to [facility] ER Sunday, May 27th with nosebleed.  Reviewed ER notes and states that nose was not actively bleeding but she has been given Afrin in route to ER by EMS. Per notes INR was checked but no INR was in records. Patient's care giver reports that patient picks at the "scab inside my nose" quite a bit and I believe this might have been cause of bleed. Thromboembolic signs/symptoms: None  Missed Coumadin doses: None Medication changes: no Dietary changes: no Bacterial/viral infection: no Other concerns: yes - patient requested a xray of her left leg due to pain.  Her PCP Dr Laurance Flatten was notified and he examined her in clinical pharmacist's office today.   Objective:    INR Today: 2.2 Current dose: Warfarin 5mg  tablet - takes 1 tablet daily     Assessment:    Therapeutic INR for goal of 2-3   Varicose vein Left leg pain  Plan:    1. New dose: no change   2. Next INR: 2 weeks   3.  Dr Laurance Flatten referred patient to vascular specialist  4.  Recommended APAP 500mg  1-2 every 6 hours prn pain 5.  Elevated leg several times daily for 20 minutes per time 6.  Wear compression stockings daily

## 2016-10-03 NOTE — Telephone Encounter (Signed)
Verified TAC cream information

## 2016-10-03 NOTE — Patient Instructions (Addendum)
May use Tylenol / acetaminophen 500mg  - can take 1 or 2 tablets up to every 6 hours as needed for pain  Elevated left leg several times a day - try to leave elevated 20 minutes  Wear compression stockings daily  Apply Eucerin / triamcinolone cream to affected leg (s) up to twice a day.  Call pharmacy [phone] to check price.  Dr Laurance Flatten is sending referral for vascular specialist to evaluate left leg.

## 2016-10-08 ENCOUNTER — Other Ambulatory Visit: Payer: Self-pay | Admitting: *Deleted

## 2016-10-08 ENCOUNTER — Encounter: Payer: Medicare Other | Admitting: Physical Therapy

## 2016-10-08 DIAGNOSIS — R609 Edema, unspecified: Secondary | ICD-10-CM

## 2016-10-09 ENCOUNTER — Ambulatory Visit: Payer: Medicare Other | Admitting: Family Medicine

## 2016-10-09 ENCOUNTER — Other Ambulatory Visit: Payer: Self-pay

## 2016-10-09 DIAGNOSIS — R600 Localized edema: Secondary | ICD-10-CM

## 2016-10-10 ENCOUNTER — Encounter: Payer: Self-pay | Admitting: Family Medicine

## 2016-10-10 ENCOUNTER — Ambulatory Visit (INDEPENDENT_AMBULATORY_CARE_PROVIDER_SITE_OTHER): Payer: Medicare Other | Admitting: Family Medicine

## 2016-10-10 ENCOUNTER — Encounter: Payer: Medicare Other | Admitting: Physical Therapy

## 2016-10-10 VITALS — BP 99/65 | HR 71 | Temp 97.4°F | Ht 64.0 in | Wt 122.0 lb

## 2016-10-10 DIAGNOSIS — L97321 Non-pressure chronic ulcer of left ankle limited to breakdown of skin: Secondary | ICD-10-CM | POA: Diagnosis not present

## 2016-10-10 NOTE — Progress Notes (Signed)
BP 99/65   Pulse 71   Temp 97.4 F (36.3 C) (Oral)   Ht 5\' 4"  (1.626 m)   Wt 122 lb (55.3 kg)   LMP 08/01/1982   BMI 20.94 kg/m    Subjective:    Patient ID: Patricia Villa, female    DOB: 1933/03/07, 83 y.o.   MRN: [id number]  HPI: Patricia Villa is a 83 y.o. female presenting on 10/10/2016 for Left ankle/foot blisters and draining   HPI Ankle sores Patient has had recurrent issues with sores on her ankle and foot and chronic issues with circulation and varicose veins. She started having these new sores on her inner ankle that are draining over the past week. She says they hurt like pains going into her leg. She denies any fevers or chills or redness of warmth there is a slight pink discoloration/rash surrounding the area. The source are very small and had about 3 of them that are very painful for her. She has been wearing compression stockings and using a Eucerin/triamcinolone combination.  Relevant past medical, surgical, family and social history reviewed and updated as indicated. Interim medical history since our last visit reviewed. Allergies and medications reviewed and updated.  Review of Systems  Constitutional: Negative for chills and fever.  Respiratory: Negative for chest tightness and shortness of breath.   Cardiovascular: Negative for chest pain and leg swelling.  Skin: Positive for wound. Negative for color change and rash.  Neurological: Negative for light-headedness and headaches.  Psychiatric/Behavioral: Negative for agitation and behavioral problems.  All other systems reviewed and are negative.   Per HPI unless specifically indicated above     Objective:    BP 99/65   Pulse 71   Temp 97.4 F (36.3 C) (Oral)   Ht 5\' 4"  (1.626 m)   Wt 122 lb (55.3 kg)   LMP 08/01/1982   BMI 20.94 kg/m   Wt Readings from Last 3 Encounters:  10/10/16 122 lb (55.3 kg)  09/25/16 123 lb 8 oz (56 kg)  09/18/16 124 lb (56.2 kg)    Physical Exam  Constitutional:  She is oriented to person, place, and time. She appears well-developed and well-nourished. No distress.  Eyes: Conjunctivae are normal.  Musculoskeletal: Normal range of motion. She exhibits no edema.  Neurological: She is alert and oriented to person, place, and time. Coordination normal.  Skin: Skin is warm and dry. Lesion (3 very small ulcers that are less than 0.1 cm diameter on her inner ankle, mild pink rash that is macular in the area.) noted. No rash noted. She is not diaphoretic.  Psychiatric: She has a normal mood and affect. Her behavior is normal.  Nursing note and vitals reviewed.  Nurse to place [employer]     Assessment & Plan:   Problem List Items Addressed This Visit    None    Visit Diagnoses    Ulcer of ankle, left, limited to breakdown of skin ([place])    -  Primary   Nurse to apply Unna boot, see back in 1 week      She has an appointment with the vascular surgeon in a month and a half. Follow up plan: Return in about 1 week (around 10/17/2016), or if symptoms worsen or fail to improve, for Follow-up left ankle.  Counseling provided for all of the vaccine components No orders of the defined types were placed in this encounter.   Caryl Pina, MD [facility] 10/10/2016, 11:42 AM

## 2016-10-21 ENCOUNTER — Encounter: Payer: Self-pay | Admitting: Family Medicine

## 2016-10-21 ENCOUNTER — Ambulatory Visit (INDEPENDENT_AMBULATORY_CARE_PROVIDER_SITE_OTHER): Payer: Medicare Other | Admitting: Family Medicine

## 2016-10-21 ENCOUNTER — Ambulatory Visit (INDEPENDENT_AMBULATORY_CARE_PROVIDER_SITE_OTHER): Payer: Medicare Other | Admitting: Pharmacist

## 2016-10-21 ENCOUNTER — Encounter: Payer: Self-pay | Admitting: Pharmacist

## 2016-10-21 VITALS — BP 99/65 | HR 71 | Temp 97.4°F | Ht 64.0 in | Wt 122.0 lb

## 2016-10-21 DIAGNOSIS — I480 Paroxysmal atrial fibrillation: Secondary | ICD-10-CM | POA: Diagnosis not present

## 2016-10-21 DIAGNOSIS — R040 Epistaxis: Secondary | ICD-10-CM | POA: Diagnosis not present

## 2016-10-21 DIAGNOSIS — D649 Anemia, unspecified: Secondary | ICD-10-CM | POA: Diagnosis not present

## 2016-10-21 DIAGNOSIS — L97321 Non-pressure chronic ulcer of left ankle limited to breakdown of skin: Secondary | ICD-10-CM | POA: Diagnosis not present

## 2016-10-21 DIAGNOSIS — R791 Abnormal coagulation profile: Secondary | ICD-10-CM

## 2016-10-21 DIAGNOSIS — I481 Persistent atrial fibrillation: Secondary | ICD-10-CM

## 2016-10-21 DIAGNOSIS — I4819 Other persistent atrial fibrillation: Secondary | ICD-10-CM

## 2016-10-21 LAB — CBC WITH DIFFERENTIAL/PLATELET
BASOS: 0 %
Basophils Absolute: 0 10*3/uL (ref 0.0–0.2)
EOS (ABSOLUTE): 0.3 10*3/uL (ref 0.0–0.4)
EOS: 4 %
Hematocrit: 32.5 % — ABNORMAL LOW (ref 34.0–46.6)
Hemoglobin: 10.2 g/dL — ABNORMAL LOW (ref 11.1–15.9)
IMMATURE GRANULOCYTES: 0 %
Immature Grans (Abs): 0 10*3/uL (ref 0.0–0.1)
Lymphocytes Absolute: 0.9 10*3/uL (ref 0.7–3.1)
Lymphs: 11 %
MCH: 30.6 pg (ref 26.6–33.0)
MCHC: 31.4 g/dL — AB (ref 31.5–35.7)
MCV: 98 fL — ABNORMAL HIGH (ref 79–97)
MONOS ABS: 0.9 10*3/uL (ref 0.1–0.9)
Monocytes: 10 %
Neutrophils Absolute: 6.1 10*3/uL (ref 1.4–7.0)
Neutrophils: 75 %
Platelets: 348 10*3/uL (ref 150–379)
RBC: 3.33 x10E6/uL — ABNORMAL LOW (ref 3.77–5.28)
RDW: 18.5 % — ABNORMAL HIGH (ref 12.3–15.4)
WBC: 8.2 10*3/uL (ref 3.4–10.8)

## 2016-10-21 LAB — COAGUCHEK XS/INR WAIVED
INR: 4.7 — AB (ref 0.9–1.1)
Prothrombin Time: 56.9 s

## 2016-10-21 NOTE — Progress Notes (Signed)
BP 99/65   Pulse 71   Temp 97.4 F (36.3 C) (Oral)   Ht 5\' 4"  (1.626 m)   Wt 122 lb (55.3 kg)   LMP 08/01/1982   BMI 20.94 kg/m    Subjective:    Patient ID: Patricia Villa, female    DOB: 11-16-1932, 83 y.o.   MRN: [id number]  HPI: Patricia Villa is a 83 y.o. female presenting on 10/21/2016 for Left ankle ulcer (followup)   HPI Wound recheck of left ankle Patient is coming in today for wound recheck of left ankle where she has 3 small ulcerations. She says the pain is greatly reduced and she has been wearing the Unna boot for the past 11 days. She denies any fevers or chills or redness or warmth. She says the swelling has been doing well with the Unna boot wrap on.  Nosebleed Patient just started having nosebleed here in the office, she just had her INR checked with Vernona Rieger PhD and found out that her INR was elevated at 4.7. She was having some sneezing and then she's been having some nose bleeding. We are having her pinch her nose now and see if it can desist with the bleeding without having a pack it. She denies any lightheadedness or dizziness.  Relevant past medical, surgical, family and social history reviewed and updated as indicated. Interim medical history since our last visit reviewed. Allergies and medications reviewed and updated.  Review of Systems  Constitutional: Negative for chills and fever.  HENT: Positive for nosebleeds. Negative for congestion, ear discharge and ear pain.   Eyes: Negative for redness and visual disturbance.  Respiratory: Negative for chest tightness and shortness of breath.   Cardiovascular: Negative for chest pain and leg swelling.  Genitourinary: Negative for difficulty urinating and dysuria.  Musculoskeletal: Negative for back pain and gait problem.  Skin: Positive for wound. Negative for rash.  Neurological: Negative for light-headedness and headaches.  Psychiatric/Behavioral: Negative for agitation and behavioral problems.    All other systems reviewed and are negative.   Per HPI unless specifically indicated above        Objective:    BP 99/65   Pulse 71   Temp 97.4 F (36.3 C) (Oral)   Ht 5\' 4"  (1.626 m)   Wt 122 lb (55.3 kg)   LMP 08/01/1982   BMI 20.94 kg/m   Wt Readings from Last 3 Encounters:  10/21/16 122 lb (55.3 kg)  10/10/16 122 lb (55.3 kg)  09/25/16 123 lb 8 oz (56 kg)    Physical Exam  Constitutional: She is oriented to person, place, and time. She appears well-developed and well-nourished. No distress.  Eyes: Conjunctivae are normal.  Cardiovascular: Normal rate, regular rhythm, normal heart sounds and intact distal pulses.   No murmur heard. Pulmonary/Chest: Effort normal and breath sounds normal. No respiratory distress. She has no wheezes. She has no rales.  Musculoskeletal: Normal range of motion. She exhibits no edema (No edema noted in left lower extremity, doing well with wraps.).  Neurological: She is alert and oriented to person, place, and time. Coordination normal.  Skin: Skin is warm and dry. Lesion (Patient still has 3 very small ulcerations, no induration or fluctuation or tenderness noted today. No drainage. Appear to be healing.) noted. No rash noted. She is not diaphoretic.  Psychiatric: She has a normal mood and affect. Her behavior is normal.  Nursing note and vitals reviewed.   INR: 4.7  Nurse to place an [place]  boot    Assessment & Plan:   Problem List Items Addressed This Visit    None    Visit Diagnoses    Nosebleed    -  Primary   Supratherapeutic INR       INR was 4.7, she is going to hold her Coumadin for 2 days, she was able to finally get the nosebleed to stop by compression, no vitamin K was given   Ulcer of ankle, left, limited to breakdown of skin (HCC)       Do one more week of Unna boot, likely will be improved       Follow up plan: Return if symptoms worsen or fail to improve.  Counseling provided for all of the vaccine  components No orders of the defined types were placed in this encounter.   Caryl Pina, MD [facility] 10/21/2016, 12:10 PM

## 2016-10-23 ENCOUNTER — Ambulatory Visit (INDEPENDENT_AMBULATORY_CARE_PROVIDER_SITE_OTHER): Payer: Medicare Other | Admitting: Family Medicine

## 2016-10-23 ENCOUNTER — Encounter: Payer: Self-pay | Admitting: Family Medicine

## 2016-10-23 ENCOUNTER — Other Ambulatory Visit: Payer: Self-pay | Admitting: *Deleted

## 2016-10-23 VITALS — BP 96/57 | HR 67 | Temp 97.2°F | Ht 64.0 in | Wt 123.0 lb

## 2016-10-23 DIAGNOSIS — I739 Peripheral vascular disease, unspecified: Secondary | ICD-10-CM

## 2016-10-23 DIAGNOSIS — R2681 Unsteadiness on feet: Secondary | ICD-10-CM

## 2016-10-23 DIAGNOSIS — R8299 Other abnormal findings in urine: Secondary | ICD-10-CM | POA: Diagnosis not present

## 2016-10-23 DIAGNOSIS — E559 Vitamin D deficiency, unspecified: Secondary | ICD-10-CM

## 2016-10-23 DIAGNOSIS — I481 Persistent atrial fibrillation: Secondary | ICD-10-CM

## 2016-10-23 DIAGNOSIS — E7800 Pure hypercholesterolemia, unspecified: Secondary | ICD-10-CM | POA: Diagnosis not present

## 2016-10-23 DIAGNOSIS — L03116 Cellulitis of left lower limb: Secondary | ICD-10-CM | POA: Diagnosis not present

## 2016-10-23 DIAGNOSIS — R040 Epistaxis: Secondary | ICD-10-CM

## 2016-10-23 DIAGNOSIS — I10 Essential (primary) hypertension: Secondary | ICD-10-CM | POA: Diagnosis not present

## 2016-10-23 DIAGNOSIS — R82998 Other abnormal findings in urine: Secondary | ICD-10-CM

## 2016-10-23 DIAGNOSIS — I480 Paroxysmal atrial fibrillation: Secondary | ICD-10-CM | POA: Diagnosis not present

## 2016-10-23 DIAGNOSIS — I4819 Other persistent atrial fibrillation: Secondary | ICD-10-CM

## 2016-10-23 LAB — URINALYSIS, COMPLETE
Bilirubin, UA: NEGATIVE
GLUCOSE, UA: NEGATIVE
KETONES UA: NEGATIVE
Nitrite, UA: POSITIVE — AB
SPEC GRAV UA: 1.015 (ref 1.005–1.030)
Urobilinogen, Ur: 1 mg/dL (ref 0.2–1.0)
pH, UA: 6 (ref 5.0–7.5)

## 2016-10-23 LAB — MICROSCOPIC EXAMINATION
Renal Epithel, UA: NONE SEEN /hpf
WBC, UA: >30 /hpf — AB (ref 0–?)

## 2016-10-23 LAB — COAGUCHEK XS/INR WAIVED
INR: 2.6 — AB (ref 0.9–1.1)
PROTHROMBIN TIME: 30.8 s

## 2016-10-23 MED ORDER — FUROSEMIDE 40 MG PO TABS: 40.0000 mg | ORAL_TABLET | Freq: Two times a day (BID) | ORAL | 0 refills | Status: DC

## 2016-10-23 NOTE — Progress Notes (Signed)
Subjective:    Patient ID: Patricia Villa, female    DOB: 02-21-1933, 83 y.o.   MRN: [id number]  HPI Pt here for follow up and management of chronic medical problems which includes hyperlipidemia and a fib. She is taking medication regularly.The patient has been anemic and her hemoglobin had been improving as of 1 month ago but it has recently declined again to 10.2 from 11.1. The platelet count was adequate. The white blood cell count was not elevated. This lab work was done on June 18. Unfortunately she has had several nosebleeds. Other lab work that is been done will be reviewed with her during the visit today. A traditional lipid panel has a total cholesterol that is low at 98 triglycerides are good at 75 and the LDL C cholesterol is good at 49. The good cholesterol is lower than it has been in the past probably secondary to inactivity the patient has had. The most recent fecal occult blood test card was 2 months ago and this was negative. A urine culture done 2 months ago had no growth. She has not had any recent lab work other than what was done for 6-18. The caregiver comes with the patient today. She denies any chest pain or shortness of breath anymore than usual. She denies any trouble with her stomach including nausea vomiting diarrhea or blood in the stool. She does indicate that her urines have been darker than usual. The Unna boot put on by Dr. Frederic Jericho was removed today and her leg looks excellent. She's been taking Lasix 40 daily and we will ask that she stay on this until she gets home today we'll ask the caregiver to put some support hose on the leg to keep the swelling down. The lower leg cellulitis appears to be improved. We will wait until Monday to see how the swelling does before placing on another Unna boot. The patient does tell me that she fell backward a couple weeks ago and landed on her back and that she's had some numbness and tingling in her body since that fall but is not  been incapacitating for her. There is no specific pain other than just some generalized back pain.    In April 2018. She did have the CBC repeated 2 days ago. Patient Active Problem List   Diagnosis Date Noted  . Secondary pulmonary arterial hypertension ([place]) 07/05/2016  . Dysuria 06/12/2016  . History of cystocele 06/12/2016  . Vocal cord paralysis, unilateral complete   . Vertebrobasilar insufficiency   . Stroke due to embolism of left cerebellar artery ([place])   . Aortic atherosclerosis ([place]) 11/20/2015  . Atrial fibrillation, persistent ([place]) 10/30/2015  . Atrial fibrillation ([place]) 11/23/2014  . Osteoporosis 08/31/2014  . Chronic neck pain 06/07/2014  . DDD (degenerative disc disease), cervical 01/24/2014  . Hyperlipidemia 05/17/2013  . Colitis 01/20/2013  . Generalized anxiety disorder 08/05/2012  . Meningioma ([place]) 08/05/2012  . Bruit 10/17/2010  . Essential hypertension, benign 10/04/2009  . MITRAL INSUFFICIENCY 09/26/2009  . Mitral valve disorder 09/26/2009  . Hypertrophic obstructive cardiomyopathy ([place]) 09/26/2009   Outpatient Encounter Prescriptions as of 10/23/2016  Medication Sig  . Cholecalciferol (VITAMIN D3) 2000 UNITS TABS Take 2,000 Units by mouth daily.  Marland Kitchen donepezil (ARICEPT) 10 MG tablet Take 1 tablet (10 mg total) by mouth at bedtime. (Patient taking differently: Take 10 mg by mouth daily. )  . DULoxetine (CYMBALTA) 30 MG capsule TAKE ONE CAPSULE BY MOUTH ONCE DAILY  . FeFum-FePoly-FA-B Cmp-C-Biot (INTEGRA  PLUS) CAPS Take 1 capsule by mouth daily.  . folic acid (FOLVITE) 1 MG tablet Take 1 mg by mouth daily.  . furosemide (LASIX) 20 MG tablet Take 2 tablets (40 mg total) by mouth 2 (two) times daily. As directed  . metolazone (ZAROXOLYN) 2.5 MG tablet Take 1 tablet Monday, Wednesday and Friday up to 4 doses  . metoprolol succinate (TOPROL-XL) 25 MG 24 hr tablet Take 0.5 tablets (12.5 mg total) by mouth daily.  . mupirocin ointment (BACTROBAN) 2 % Place 1  application into the nose 2 (two) times daily.  . Triamcinolone Acetonide (TRIAMCINOLONE 0.1 % CREAM : EUCERIN) CREA Apply 1 application topically 2 (two) times daily.  Marland Kitchen warfarin (COUMADIN) 5 MG tablet TAKE 1 TABLET BY MOUTH ONCE DAILY   No facility-administered encounter medications on file as of 10/23/2016.      Review of Systems  Constitutional: Negative.   HENT: Positive for nosebleeds.   Eyes: Negative.   Respiratory: Negative.   Cardiovascular: Negative.   Gastrointestinal: Negative.   Endocrine: Negative.   Genitourinary: Negative.   Musculoskeletal: Negative.   Skin: Negative.        Left lower leg wrap / cellulitis  Allergic/Immunologic: Negative.   Neurological: Negative.   Hematological: Negative.   Psychiatric/Behavioral: Negative.        Objective:   Physical Exam  Constitutional: She is oriented to person, place, and time. She appears well-developed and well-nourished. No distress.  The patient is calmer and in a better demeanor today than she was previously.  HENT:  Head: Normocephalic and atraumatic.  Right Ear: External ear normal.  Left Ear: External ear normal.  Mouth/Throat: Oropharynx is clear and moist.  The packing was removed from the left side of the nose without any bleeding being notice other than on the packing itself. It appears that the bleeding is high up that had occurred that was on the packing and not low down. There was no bleeding noted in the throat. Because this has been a recurrent problem as mentioned earlier we will also do an urgent referral to ear nose and throat to see if we can get this stopped.  Eyes: Conjunctivae and EOM are normal. Pupils are equal, round, and reactive to light. Right eye exhibits no discharge. Left eye exhibits no discharge. No scleral icterus.  Neck: Normal range of motion. Neck supple. No thyromegaly present.  Cardiovascular: Normal rate, regular rhythm and normal heart sounds.   No murmur heard. Heart is  fairly regular today at 72/m  Pulmonary/Chest: Effort normal and breath sounds normal. No respiratory distress. She has no wheezes. She has no rales.  Clear anteriorly and posteriorly  Abdominal: Soft. Bowel sounds are normal. There is no tenderness.  Musculoskeletal: Normal range of motion. She exhibits no edema.  The patient is using a walker. There is no edema in the left lower leg and there is no sign of any redness or infection today.  Lymphadenopathy:    She has no cervical adenopathy.  Neurological: She is alert and oriented to person, place, and time.  Skin: Skin is warm and dry. No rash noted. No erythema.  Psychiatric: She has a normal mood and affect. Her behavior is normal. Judgment and thought content normal.  Nursing note and vitals reviewed.  BP (!) 96/57 (BP Location: Left Arm)   Pulse 67   Temp 97.2 F (36.2 C) (Oral)   Ht _0  (1.626 m)   Wt 123 lb (55.8 kg)   LMP 08/01/1982  BMI 21.11 kg/m         Assessment & Plan:  1. Paroxysmal atrial fibrillation (HCC) -The heart had a normal sinus rhythm today.  2. Pure hypercholesterolemia -Continue current treatment pending results of future lab work  3. Vitamin D deficiency -Continue current treatment  4. Essential hypertension, benign -Continue current treatment  5. Gait instability -Continue using walker  6. Nosebleed -No site of bleeding was noted today and the patient will start using some nasal saline gel but we will still follow through with an ear nose and throat appointment - CoaguChek XS/INR Waived - BMP8+EGFR - Ambulatory referral to ENT  7. Left leg cellulitis -This is improved and the patient will return to the office on Monday to reevaluate the cellulitis and edema and will stay on Lasix 40 mg twice daily until that time - BMP8+EGFR  8. Arterial insufficiency of lower extremity (HCC)  9. Dark urine -We will make sure that she is not losing any blood in the urine as her hemoglobin on the  recent CBC had dropped some sense the previous CBC. She will stay on the iron medicine until we can get the hemoglobin up there and keeping it up there. - Urinalysis, Complete  Meds ordered this encounter  Medications  . furosemide (LASIX) 40 MG tablet    Sig: Take 1 tablet (40 mg total) by mouth 2 (two) times daily. As directed    Dispense:  180 tablet    Refill:  0   Patient Instructions                       Medicare Annual Wellness Visit  [facility] and the medical providers at [facility] strive to bring you the best medical care.  In doing so we not only want to address your current medical conditions and concerns but also to detect new conditions early and prevent illness, disease and health-related problems.    Medicare offers a yearly Wellness Visit which allows our clinical staff to assess your need for preventative services including immunizations, lifestyle education, counseling to decrease risk of preventable diseases and screening for fall risk and other medical concerns.    This visit is provided free of charge (no copay) for all Medicare recipients. The clinical pharmacists at [facility] have begun to conduct these Wellness Visits which will also include a thorough review of all your medications.    As you primary medical provider recommend that you make an appointment for your Annual Wellness Visit if you have not done so already this year.  You may set up this appointment before you leave today or you may call back ([phone]) and schedule an appointment.  Please make sure when you call that you mention that you are scheduling your Annual Wellness Visit with the clinical pharmacist so that the appointment may be made for the proper length of time.     Continue current medications. Continue good therapeutic lifestyle changes which include good diet and exercise. Fall precautions discussed with patient. If an FOBT was given  today- please return it to our front desk. If you are over 26 years old - you may need Prevnar 32 or the adult Pneumonia vaccine.  **Flu shots are available--- please call and schedule a FLU-CLINIC appointment**  After your visit with [place] today you will receive a survey in the mail or online from [employer] regarding your care with [place]. Please take a moment to fill  this out. Your feedback is very important to [place] as you can help [place] better understand your patient needs as well as improve your experience and satisfaction. WE CARE ABOUT YOU!!!   We will do an urgent referral to ear nose and throat because of recurring nosebleeds that appeared to be coming from higher up than the nasal septum. We'll call with additional blood work that is being done today which include a urinalysis and BMP and pro time. She will continue with the Lasix 40 twice a day and will return to see Dr. Royce Macadamia on Monday because of monitoring the cellulitis and swelling She will use nasal saline gel in each nostril 4 times daily. She should use her walker at all times They should reapply the support stockings when they get home from the office today.    Arrie Senate MD

## 2016-10-23 NOTE — Patient Instructions (Addendum)
Medicare Annual Wellness Visit  [facility] and the medical providers at [facility] strive to bring you the best medical care.  In doing so we not only want to address your current medical conditions and concerns but also to detect new conditions early and prevent illness, disease and health-related problems.    Medicare offers a yearly Wellness Visit which allows our clinical staff to assess your need for preventative services including immunizations, lifestyle education, counseling to decrease risk of preventable diseases and screening for fall risk and other medical concerns.    This visit is provided free of charge (no copay) for all Medicare recipients. The clinical pharmacists at [facility] have begun to conduct these Wellness Visits which will also include a thorough review of all your medications.    As you primary medical provider recommend that you make an appointment for your Annual Wellness Visit if you have not done so already this year.  You may set up this appointment before you leave today or you may call back ([phone]) and schedule an appointment.  Please make sure when you call that you mention that you are scheduling your Annual Wellness Visit with the clinical pharmacist so that the appointment may be made for the proper length of time.     Continue current medications. Continue good therapeutic lifestyle changes which include good diet and exercise. Fall precautions discussed with patient. If an FOBT was given today- please return it to our front desk. If you are over 4 years old - you may need Prevnar 72 or the adult Pneumonia vaccine.  **Flu shots are available--- please call and schedule a FLU-CLINIC appointment**  After your visit with [place] today you will receive a survey in the mail or online from [employer] regarding your care with [place]. Please take a moment to fill this out. Your feedback is very  important to [place] as you can help [place] better understand your patient needs as well as improve your experience and satisfaction. WE CARE ABOUT YOU!!!   We will do an urgent referral to ear nose and throat because of recurring nosebleeds that appeared to be coming from higher up than the nasal septum. We'll call with additional blood work that is being done today which include a urinalysis and BMP and pro time. She will continue with the Lasix 40 twice a day and will return to see Dr. Royce Macadamia on Monday because of monitoring the cellulitis and swelling She will use nasal saline gel in each nostril 4 times daily. She should use her walker at all times They should reapply the support stockings when they get home from the office today.

## 2016-10-24 LAB — BMP8+EGFR
BUN/Creatinine Ratio: 26 (ref 12–28)
BUN: 19 mg/dL (ref 8–27)
CO2: 25 mmol/L (ref 20–29)
CREATININE: 0.73 mg/dL (ref 0.57–1.00)
Calcium: 9.1 mg/dL (ref 8.7–10.3)
Chloride: 94 mmol/L — ABNORMAL LOW (ref 96–106)
GFR calc Af Amer: 89 mL/min/{1.73_m2} (ref >59)
GFR, EST NON AFRICAN AMERICAN: 77 mL/min/{1.73_m2} (ref >59)
Glucose: 84 mg/dL (ref 65–99)
POTASSIUM: 4 mmol/L (ref 3.5–5.2)
SODIUM: 135 mmol/L (ref 134–144)

## 2016-10-25 ENCOUNTER — Other Ambulatory Visit: Payer: Medicare Other

## 2016-10-25 ENCOUNTER — Other Ambulatory Visit: Payer: Self-pay | Admitting: *Deleted

## 2016-10-25 DIAGNOSIS — R8290 Unspecified abnormal findings in urine: Secondary | ICD-10-CM

## 2016-10-27 LAB — URINE CULTURE

## 2016-10-28 ENCOUNTER — Ambulatory Visit (INDEPENDENT_AMBULATORY_CARE_PROVIDER_SITE_OTHER): Payer: Medicare Other | Admitting: Family Medicine

## 2016-10-28 ENCOUNTER — Ambulatory Visit: Payer: Medicare Other | Admitting: Family Medicine

## 2016-10-28 ENCOUNTER — Other Ambulatory Visit: Payer: Self-pay | Admitting: *Deleted

## 2016-10-28 ENCOUNTER — Encounter: Payer: Self-pay | Admitting: Family Medicine

## 2016-10-28 VITALS — BP 135/69 | HR 77 | Temp 96.6°F | Ht 64.0 in | Wt 124.8 lb

## 2016-10-28 DIAGNOSIS — I83023 Varicose veins of left lower extremity with ulcer of ankle: Secondary | ICD-10-CM

## 2016-10-28 DIAGNOSIS — L97321 Non-pressure chronic ulcer of left ankle limited to breakdown of skin: Secondary | ICD-10-CM | POA: Diagnosis not present

## 2016-10-28 MED ORDER — AMOXICILLIN-POT CLAVULANATE 875-125 MG PO TABS: 1.0000 | ORAL_TABLET | Freq: Two times a day (BID) | ORAL | 0 refills | Status: DC

## 2016-10-28 MED ORDER — MUPIROCIN 2 % EX OINT: 1.0000 "application " | TOPICAL_OINTMENT | Freq: Two times a day (BID) | CUTANEOUS | 1 refills | Status: DC

## 2016-10-28 NOTE — Patient Instructions (Signed)
Great to see you!  Apply mupirocin twice daily and a simply nonstick dressing.   Come back Friday to see how its going

## 2016-10-28 NOTE — Progress Notes (Signed)
   HPI  Patient presents today here with left leg wound.  Patient and daughter state that she has struggled with this area leaking a small amount of fluid off and on for 6-8 weeks. They state that it leaks of small amount of fluid when it happens. She has an appointment with another physician later this afternoon but wanted to be seen sooner so she saw me this morning. She does not have any severe pain. She does have some chronic ruddiness or redness of the left lower limb. She has an appointment with pain and vascular coming up in about one month.  She has mild tenderness to palpation of the medial left ankle. She has a very small amount of clear fluid draining from the shallow appearing wound. No injury   PMH: Smoking status noted ROS: Per HPI  Objective: BP 135/69   Pulse 77   Temp (!) 96.6 F (35.9 C) (Oral)   Ht 5\' 4"  (1.626 m)   Wt 124 lb 12.8 oz (56.6 kg)   LMP 08/01/1982   BMI 21.42 kg/m  Gen: NAD, alert, cooperative with exam HEENT: NCAT CV: RRR, good S1/S2, no murmur Resp: CTABL, no wheezes, non-labored Ext: No edema, warm, left lower extremity appears to be slightly smaller compared to the right lower extremity, possibly as chronic scarring. Left medial ankle with approximately 3 cm x 1 cm shallow erosion of the skin draining a very small amount of clear fluid Neuro: Alert and oriented, No gross deficits   Assessment and plan:  # Venous stasis ulcer of the left ankle Most likely venous stasis ulcer Patient does have brisk cap refill in that leg, it is not cool to the touch, and she does have 1+ DP pulse. Treat conservatively with mupirocin ointment, nonstick pad, and simple wrap, noncompression. Recommended follow-up with beta vascular as planned. Follow-up with me in one week, if no improvement would consider wound care     Meds ordered this encounter  Medications  . mupirocin ointment (BACTROBAN) 2 %    Sig: Apply 1 application topically 2 (two) times  daily.    Dispense:  30 g    Refill:  [place], MD [facility] 10/28/2016, 12:51 PM

## 2016-10-30 ENCOUNTER — Ambulatory Visit (INDEPENDENT_AMBULATORY_CARE_PROVIDER_SITE_OTHER): Payer: Medicare Other | Admitting: Pharmacist

## 2016-10-30 DIAGNOSIS — I480 Paroxysmal atrial fibrillation: Secondary | ICD-10-CM

## 2016-10-30 DIAGNOSIS — I4819 Other persistent atrial fibrillation: Secondary | ICD-10-CM

## 2016-10-30 DIAGNOSIS — I481 Persistent atrial fibrillation: Secondary | ICD-10-CM

## 2016-10-30 LAB — COAGUCHEK XS/INR WAIVED
INR: 4.2 — ABNORMAL HIGH (ref 0.9–1.1)
PROTHROMBIN TIME: 51 s

## 2016-10-30 NOTE — Progress Notes (Unsigned)
Patient seen Patricia Villa for INR check and wanted her leg looked at where it was still weeping. Patient was seen my Wendi Snipes 6/25 and had follow up with [facility] on Friday. Patents leg was evaluated and re wrap the same it was when seeing Wendi Snipes. Left leg was weeping some and small sores near ankle which was covered and wrapped. Apt moved to 6/28 with [facility] for follow up.

## 2016-10-30 NOTE — Patient Instructions (Signed)
Anticoagulation Warfarin Dose Instructions as of 10/30/2016      Dorene Grebe Tue Wed Thu Fri Sat   10/30/2016 thru 11/02/2016    Hold Hold 2.5 mg 5 mg   Next week - 11/03/2016 thru 11/08/216 5 mg 2.5 mg 5 mg 2.5 mg 5 mg 2.5 mg 5 mg    Description   No warfarin today - Wednesday, June 27th or tomorrow Thursday, June 28th.  Then decrease warfarin dose to   INR today:  4.2 (goal: 2.0 to 3.0)

## 2016-10-31 ENCOUNTER — Ambulatory Visit (INDEPENDENT_AMBULATORY_CARE_PROVIDER_SITE_OTHER): Payer: Medicare Other | Admitting: Family Medicine

## 2016-10-31 ENCOUNTER — Encounter: Payer: Self-pay | Admitting: Family Medicine

## 2016-10-31 VITALS — BP 98/63 | HR 79 | Temp 97.0°F | Ht 64.0 in | Wt 125.0 lb

## 2016-10-31 DIAGNOSIS — L97301 Non-pressure chronic ulcer of unspecified ankle limited to breakdown of skin: Secondary | ICD-10-CM | POA: Diagnosis not present

## 2016-10-31 DIAGNOSIS — R0989 Other specified symptoms and signs involving the circulatory and respiratory systems: Secondary | ICD-10-CM

## 2016-10-31 DIAGNOSIS — I83003 Varicose veins of unspecified lower extremity with ulcer of ankle: Secondary | ICD-10-CM | POA: Diagnosis not present

## 2016-10-31 DIAGNOSIS — I739 Peripheral vascular disease, unspecified: Secondary | ICD-10-CM

## 2016-10-31 NOTE — Progress Notes (Signed)
Subjective:    Patient ID: Patricia Villa, female    DOB: 04/19/1933, 83 y.o.   MRN: [id number]  HPI  Patient here today for follow up on left ankle venous stasis ulcer. This problem has been going on for several weeks. She has had the application of Unna boots periodically and compression hose with dressings periodically. The patient has also had some foamy congestion at night with no fever and the foamy congestion is white or clear. She comes today with her caregiver.    Patient Active Problem List   Diagnosis Date Noted  . Secondary pulmonary arterial hypertension ([place]) 07/05/2016  . Dysuria 06/12/2016  . History of cystocele 06/12/2016  . Vocal cord paralysis, unilateral complete   . Vertebrobasilar insufficiency   . Stroke due to embolism of left cerebellar artery ([place])   . Aortic atherosclerosis ([place]) 11/20/2015  . Atrial fibrillation, persistent ([place]) 10/30/2015  . Atrial fibrillation ([place]) 11/23/2014  . Osteoporosis 08/31/2014  . Chronic neck pain 06/07/2014  . DDD (degenerative disc disease), cervical 01/24/2014  . Hyperlipidemia 05/17/2013  . Colitis 01/20/2013  . Generalized anxiety disorder 08/05/2012  . Meningioma ([place]) 08/05/2012  . Bruit 10/17/2010  . Essential hypertension, benign 10/04/2009  . MITRAL INSUFFICIENCY 09/26/2009  . Mitral valve disorder 09/26/2009  . Hypertrophic obstructive cardiomyopathy ([place]) 09/26/2009   Outpatient Encounter Prescriptions as of 10/31/2016  Medication Sig  . amoxicillin-clavulanate (AUGMENTIN) 875-125 MG tablet Take 1 tablet by mouth 2 (two) times daily.  . Cholecalciferol (VITAMIN D3) 2000 UNITS TABS Take 2,000 Units by mouth daily.  Marland Kitchen donepezil (ARICEPT) 10 MG tablet Take 1 tablet (10 mg total) by mouth at bedtime. (Patient taking differently: Take 10 mg by mouth daily. )  . DULoxetine (CYMBALTA) 30 MG capsule TAKE ONE CAPSULE BY MOUTH ONCE DAILY  . FeFum-FePoly-FA-B Cmp-C-Biot (INTEGRA PLUS) CAPS Take 1 capsule by mouth  daily.  . folic acid (FOLVITE) 1 MG tablet Take 1 mg by mouth daily.  . furosemide (LASIX) 40 MG tablet Take 1 tablet (40 mg total) by mouth 2 (two) times daily. As directed  . metoprolol succinate (TOPROL-XL) 25 MG 24 hr tablet Take 0.5 tablets (12.5 mg total) by mouth daily.  . mupirocin ointment (BACTROBAN) 2 % Apply 1 application topically 2 (two) times daily.  . Triamcinolone Acetonide (TRIAMCINOLONE 0.1 % CREAM : EUCERIN) CREA Apply 1 application topically 2 (two) times daily.  Marland Kitchen warfarin (COUMADIN) 5 MG tablet TAKE 1 TABLET BY MOUTH ONCE DAILY   No facility-administered encounter medications on file as of 10/31/2016.       Review of Systems  Constitutional: Negative.   HENT: Negative.   Eyes: Negative.   Respiratory: Negative.   Cardiovascular: Negative.   Gastrointestinal: Negative.   Endocrine: Negative.   Genitourinary: Negative.   Musculoskeletal: Negative.   Skin: Positive for wound (left ankle wound / ulcer).  Allergic/Immunologic: Negative.   Neurological: Negative.   Hematological: Negative.   Psychiatric/Behavioral: Negative.        Objective:   Physical Exam  Constitutional: She is oriented to person, place, and time. No distress.  Elderly and thin but alert  HENT:  Head: Normocephalic and atraumatic.  Right Ear: External ear normal.  Left Ear: External ear normal.  Nose: Nose normal.  Mouth/Throat: Oropharynx is clear and moist.  Eyes: Conjunctivae and EOM are normal. Pupils are equal, round, and reactive to light. Right eye exhibits no discharge. Left eye exhibits no discharge. No scleral icterus.  Neck: Normal range of motion.  Neck supple.  Cardiovascular: Normal rate, regular rhythm and normal heart sounds.   No murmur heard. Diminished pedal pulses on the left leg and foot. The heart is regular at 60/m today.  Pulmonary/Chest: Effort normal and breath sounds normal. No respiratory distress. She has no wheezes. She has no rales.  The chest is clear  anteriorly and posteriorly even with coughing. She has a dry cough.  Musculoskeletal: She exhibits tenderness. She exhibits no edema.  Tight skin over the left lower leg and ankle with very prominent varicosities. No edema. Range of motion is hesitant due to her back pain.  Lymphadenopathy:    She has no cervical adenopathy.  Neurological: She is alert and oriented to person, place, and time.  Skin: Skin is warm and dry. No rash noted.  No rubor of the left lower extremity. There are 2 small ulcerated areas that are not draining currently. These will be redressed with non-adherent dressing applied with antibiotic ointment and support hose pulled over these.  Psychiatric: She has a normal mood and affect. Her behavior is normal. Judgment and thought content normal.  Nursing note and vitals reviewed.  BP 98/63 (BP Location: Left Arm)   Pulse 79   Temp 97 F (36.1 C) (Oral)   Ht 5\' 4"  (1.626 m)   Wt 125 lb (56.7 kg)   LMP 08/01/1982   BMI 21.46 kg/m         Assessment & Plan:  1. Venous stasis ulcer of ankle limited to breakdown of skin with varicose veins, unspecified laterality ([place]) -This is on the left lower ankle. -She will continue to apply antibiotic ointment with nonadherent dressing and compression stockings -She will elevate the foot as much as possible during the day when she is sitting  2. Arterial insufficiency of lower extremity ([place]) -Appointment with vascular surgeon for further evaluation and management of this persistent stasis ulcer and drainage.  3. Chest congestion -Drink plenty of fluids and stay well hydrated and take regular strength Mucinex twice daily for cough and congestion to keep congestion loose  Patient Instructions  Take Mucinex regular strength plain 1 twice daily with a large glass of water for cough and congestion Continue to drink plenty of water and fluids Elevate leg as much as possible during the day especially when sitting Continue to  apply dressing with small amount of antibiotic ointment and compression stockings and this should be done the first thing every morning We will do our best to get urine to see the vascular surgeon sooner rather than later   Arrie Senate MD

## 2016-10-31 NOTE — Patient Instructions (Signed)
Take Mucinex regular strength plain 1 twice daily with a large glass of water for cough and congestion Continue to drink plenty of water and fluids Elevate leg as much as possible during the day especially when sitting Continue to apply dressing with small amount of antibiotic ointment and compression stockings and this should be done the first thing every morning We will do our best to get urine to see the vascular surgeon sooner rather than later

## 2016-11-01 ENCOUNTER — Ambulatory Visit: Payer: Medicare Other | Admitting: Family Medicine

## 2016-11-04 ENCOUNTER — Encounter: Payer: Self-pay | Admitting: Family

## 2016-11-04 ENCOUNTER — Ambulatory Visit: Payer: Self-pay | Admitting: Family Medicine

## 2016-11-07 ENCOUNTER — Telehealth: Payer: Self-pay | Admitting: Family Medicine

## 2016-11-07 NOTE — Telephone Encounter (Signed)
Wrap until swelling goes down and then just as needed based on reoccuring swelling

## 2016-11-07 NOTE — Telephone Encounter (Signed)
Please advise 

## 2016-11-08 ENCOUNTER — Ambulatory Visit (INDEPENDENT_AMBULATORY_CARE_PROVIDER_SITE_OTHER): Payer: Medicare Other | Admitting: Pharmacist Clinician (PhC)/ Clinical Pharmacy Specialist

## 2016-11-08 DIAGNOSIS — I481 Persistent atrial fibrillation: Secondary | ICD-10-CM | POA: Diagnosis not present

## 2016-11-08 DIAGNOSIS — I480 Paroxysmal atrial fibrillation: Secondary | ICD-10-CM

## 2016-11-08 DIAGNOSIS — I4819 Other persistent atrial fibrillation: Secondary | ICD-10-CM

## 2016-11-08 LAB — COAGUCHEK XS/INR WAIVED
INR: 2.5 — ABNORMAL HIGH (ref 0.9–1.1)
PROTHROMBIN TIME: 29.8 s

## 2016-11-08 NOTE — Patient Instructions (Signed)
Anticoagulation Warfarin Dose Instructions as of 11/08/2016      Dorene Grebe Tue Wed Thu Fri Sat   New Dose 5 mg 2.5 mg 5 mg 2.5 mg 5 mg 2.5 mg 5 mg    Description   Continue following the above schedule  INR today:  2.5  (goal: 2.0 to 3.0)

## 2016-11-11 ENCOUNTER — Ambulatory Visit (INDEPENDENT_AMBULATORY_CARE_PROVIDER_SITE_OTHER): Payer: Medicare Other | Admitting: Family

## 2016-11-11 ENCOUNTER — Encounter: Payer: Self-pay | Admitting: Family

## 2016-11-11 ENCOUNTER — Ambulatory Visit (HOSPITAL_COMMUNITY)
Admission: RE | Admit: 2016-11-11 | Discharge: 2016-11-11 | Disposition: A | Discharge status: Home / Self Care | Payer: Medicare Other | Source: Ambulatory Visit | Attending: Vascular Surgery | Admitting: Vascular Surgery

## 2016-11-11 VITALS — BP 121/69 | HR 64 | Temp 97.1°F | Resp 22 | Ht 64.0 in | Wt 124.0 lb

## 2016-11-11 DIAGNOSIS — R600 Localized edema: Secondary | ICD-10-CM

## 2016-11-11 DIAGNOSIS — I8393 Asymptomatic varicose veins of bilateral lower extremities: Secondary | ICD-10-CM | POA: Insufficient documentation

## 2016-11-11 DIAGNOSIS — R609 Edema, unspecified: Secondary | ICD-10-CM | POA: Diagnosis not present

## 2016-11-11 DIAGNOSIS — I872 Venous insufficiency (chronic) (peripheral): Secondary | ICD-10-CM | POA: Diagnosis not present

## 2016-11-11 NOTE — Patient Instructions (Addendum)
Edema Edema is when you have too much fluid in your body or under your skin. Edema may make your legs, feet, and ankles swell up. Swelling is also common in looser tissues, like around your eyes. This is a common condition. It gets more common as you get older. There are many possible causes of edema. Eating too much salt (sodium) and being on your feet or sitting for a long time can cause edema in your legs, feet, and ankles. Hot weather may make edema worse. Edema is usually painless. Your skin may look swollen or shiny. Follow these instructions at home:  Keep the swollen body part raised (elevated) above the level of your heart when you are sitting or lying down.  Do not sit still or stand for a long time.  Do not wear tight clothes. Do not wear garters on your upper legs.  Exercise your legs. This can help the swelling go down.  Wear elastic bandages or support stockings as told by your doctor.  Eat a low-salt (low-sodium) diet to reduce fluid as told by your doctor.  Depending on the cause of your swelling, you may need to limit how much fluid you drink (fluid restriction).  Take over-the-counter and prescription medicines only as told by your doctor. Contact a doctor if:  Treatment is not working.  You have heart, liver, or kidney disease and have symptoms of edema.  You have sudden and unexplained weight gain. Get help right away if:  You have shortness of breath or chest pain.  You cannot breathe when you lie down.  You have pain, redness, or warmth in the swollen areas.  You have heart, liver, or kidney disease and get edema all of a sudden.  You have a fever and your symptoms get worse all of a sudden. Summary  Edema is when you have too much fluid in your body or under your skin.  Edema may make your legs, feet, and ankles swell up. Swelling is also common in looser tissues, like around your eyes.  Raise (elevate) the swollen body part above the level of your  heart when you are sitting or lying down.  Follow your doctor's instructions about diet and how much fluid you can drink (fluid restriction). This information is not intended to replace advice given to you by your health care provider. Make sure you discuss any questions you have with your health care provider. Document Released: 10/09/2007 Document Revised: 05/10/2016 Document Reviewed: 05/10/2016 Elsevier Interactive Patient Education  2017 [employer]     Chronic Venous Insufficiency Chronic venous insufficiency, also called venous stasis, is a condition that prevents blood from being pumped effectively through the veins in your legs. Blood may no longer be pumped effectively from the legs back to the heart. This condition can range from mild to severe. With proper treatment, you should be able to continue with an active life. What are the causes? Chronic venous insufficiency occurs when the vein walls become stretched, weakened, or damaged, or when valves within the vein are damaged. Some common causes of this include:  High blood pressure inside the veins (venous hypertension).  Increased blood pressure in the leg veins from long periods of sitting or standing.  A blood clot that blocks blood flow in a vein (deep vein thrombosis, DVT).  Inflammation of a vein (phlebitis) that causes a blood clot to form.  Tumors in the pelvis that cause blood to back up.  What increases the risk? The following factors  may make you more likely to develop this condition:  Having a family history of this condition.  Obesity.  Pregnancy.  Living without enough physical activity or exercise (sedentary lifestyle).  Smoking.  Having a job that requires long periods of standing or sitting in one place.  Being a certain age. Women in their 56s and 43s and men in their 36s are more likely to develop this condition.  What are the signs or symptoms? Symptoms of this condition include:  Veins  that are enlarged, bulging, or twisted (varicose veins).  Skin breakdown or ulcers.  Reddened or discolored skin on the front of the leg.  Brown, smooth, tight, and painful skin just above the ankle, usually on the inside of the leg (lipodermatosclerosis).  Swelling.  How is this diagnosed? This condition may be diagnosed based on:  Your medical history.  A physical exam.  Tests, such as: ? A procedure that creates an image of a blood vessel and nearby organs and provides information about blood flow through the blood vessel (duplex ultrasound). ? A procedure that tests blood flow (plethysmography). ? A procedure to look at the veins using X-ray and dye (venogram).  How is this treated? The goals of treatment are to help you return to an active life and to minimize pain or disability. Treatment depends on the severity of your condition, and it may include:  Wearing compression stockings. These can help relieve symptoms and help prevent your condition from getting worse. However, they do not cure the condition.  Sclerotherapy. This is a procedure involving an injection of a material that "dissolves" damaged veins.  Surgery. This may involve: ? Removing a diseased vein (vein stripping). ? Cutting off blood flow through the vein (laser ablation surgery). ? Repairing a valve.  Follow these instructions at home:  Wear compression stockings as told by your health care provider. These stockings help to prevent blood clots and reduce swelling in your legs.  Take over-the-counter and prescription medicines only as told by your health care provider.  Stay active by exercising, walking, or doing different activities. Ask your health care provider what activities are safe for you and how much exercise you need.  Drink enough fluid to keep your urine clear or pale yellow.  Do not use any products that contain nicotine or tobacco, such as cigarettes and e-cigarettes. If you need help  quitting, ask your health care provider.  Keep all follow-up visits as told by your health care provider. This is important. Contact a health care provider if:  You have redness, swelling, or more pain in the affected area.  You see a red streak or line that extends up or down from the affected area.  You have skin breakdown or a loss of skin in the affected area, even if the breakdown is small.  You get an injury in the affected area. Get help right away if:  You get an injury and an open wound in the affected area.  You have severe pain that does not get better with medicine.  You have sudden numbness or weakness in the foot or ankle below the affected area, or you have trouble moving your foot or ankle.  You have a fever and you have worse or persistent symptoms.  You have chest pain.  You have shortness of breath. Summary  Chronic venous insufficiency, also called venous stasis, is a condition that prevents blood from being pumped effectively through the veins in your legs.  Chronic venous  insufficiency occurs when the vein walls become stretched, weakened, or damaged, or when valves within the vein are damaged.  Treatment for this condition depends on how severe your condition is, and it may involve wearing compression stockings or having a procedure.  Make sure you stay active by exercising, walking, or doing different activities. Ask your health care provider what activities are safe for you and how much exercise you need. This information is not intended to replace advice given to you by your health care provider. Make sure you discuss any questions you have with your health care provider. Document Released: 08/26/2006 Document Revised: 03/11/2016 Document Reviewed: 03/11/2016 Elsevier Interactive Patient Education  2017 [place].      To decrease swelling in your foot and leg: Elevate feet above slightly bent knees, feet above heart, overnight and 3-4 times  per day for 20 minutes.    To measure for knee high compression hose: Measure the length of calf, largest circumference of calf, and ankle circumference first thing in the morning before your legs have a chance to swell.  Take these 3 measurements with you to obtain 20-30 mm mercury graduated knee high compression hose.  Put the stockings on in the morning, remove at bedtime.

## 2016-11-11 NOTE — Progress Notes (Signed)
CC: Request by Dr. Christell Constant to evaluate left lower leg for possible DVT  History of Present Illness  Patricia Villa is a 83 y.o. ([birth year]) female who  returns today, referred by Dr. Christell Constant, with worsening redness of left lower leg, son states concern for possible DVT.   Son states pt had cellulitis of left lower leg in May 2018, finished last doe of antibx last week, last was Augmentin, before that was possibly keflex and cipro. It also sounds like she had a weekly unna boot applied at her PCP's office x 2. Pt states she had a small amount of drainage from a small area at her left medial malleolus area; this seems to have resolved.  Son states that pt takes off most dressings applied to her legs, states she has dementia.  Son states pt leaves her lower legs dependent and will not elevate her legs.   She had an aortic valve procedure October 2017 in [place]., Dr. Art Buff.   She sees Dr. Pearlean Brownie for hx of 2 small strokes, has had vocal cord partial paralysis since about November 2016, has worsened, has seen ENT according to her son.   .  Dr. Arbie Cookey evaluated pt on 11-10-14 for swelling of her left lower leg which had been present and progressive for approximately 2 months. She does have a long history of scattered varicosities and telangiectasia over both lower femoris. She had had erythema and swelling at the level of her left calf. She did have a duplex which Dr. Arbie Cookey reviewed from 10/19/2014 and this showed no evidence of DVT and no evidence of superficial thrombophlebitis. She continued to have discomfort and swelling. Did have a course of oral anabiotic rule out cellulitis with minimal response from this. She had a recent diagnosis of atrial fibrillation and was placed on oral anticoagulation for that reason. No history of trauma to this area. Does report that the swelling is less when she first arises in the morning and is progressive throughout the day. Has not worn compression garments.   At  the 11-10-14 visit, Dr. Arbie Cookey discussed with patient and her family present that he did not see any evidence of venous pathology to cause this. Dr. Arbie Cookey imaged her leg and calf with SonoSite ultrasound; she did have a great deal of interstitial fluid in her calf, had some scattered varicosities but no other evidence of venous pathology to explain this. Also explained the risk of embolus from her atrial fibrillation. She had normal posterior tibial pulses bilaterally as of this is not arterial insufficiency. Dr. Arbie Cookey explained the importance of elevation and compression, suspected this will run its course. She has too much tenderness currently for compression garments.   Wrapped from foot to calf with a 4 inch Ace and explained the importance of this. She is also fitted for knee-high compression begin use once the tenderness has resolved to prevent recurrence of the swelling. She was reassured with this discussion will see [place] again on as-needed basis.     Past Medical History:  Diagnosis Date  . Allergy   . Alopecia   . Anxiety   . Arthritis   . Atrial fibrillation, persistent (HCC) 10/30/2015  . Cataract   . DDD (degenerative disc disease), cervical   . Diverticulosis of colon   . Esotropia   . History of double vision   . Hyperlipidemia   . Hypertension   . Hypertrophic cardiomyopathy (HCC)    by history but not identified on the most  recent echo  . Meningioma (HCC)    cerebellopontine angle   . Mild mitral valve prolapse   . Mitral insufficiency    Severe with anterior flail leaflet.    . Osteoporosis   . Postmenopausal HRT (hormone replacement therapy)   . 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 Vertebrobasilar insufficiency   . Vocal cord paralysis, unilateral complete     Social History Social History  Substance Use Topics  . Smoking status: Never Smoker  . Smokeless tobacco: Never Used     Comment: Rare previously  . Alcohol use  No    Family History Family History  Problem Relation Age of Onset  . Other Brother        IHSS  . COPD Brother   . Diabetes Brother   . Heart disease Brother   . Hyperlipidemia Brother   . Hypertension Brother   . Asthma Mother   . Other Sister        IHSS  . Other Sister        IHSS  . Cancer Cousin        thyroid    Surgical History Past Surgical History:  Procedure Laterality Date  . ABDOMINAL HYSTERECTOMY    . CARDIAC CATHETERIZATION N/A 01/02/2016   Procedure: Right/Left Heart Cath and Coronary Angiography;  Surgeon: Tonny Bollman, MD;  Location: [facility] INVASIVE CV LAB;  Service: Cardiovascular;  Laterality: N/A;  . CATARACT EXTRACTION, BILATERAL    . EYE SURGERY    . TEE WITHOUT CARDIOVERSION N/A 12/12/2015   Procedure: TRANSESOPHAGEAL ECHOCARDIOGRAM (TEE);  Surgeon: Lewayne Bunting, MD;  Location[facility] ENDOSCOPY;  Service: Cardiovascular;  Laterality: N/A;  . TONSILLECTOMY    . VEIN SURGERY      Allergies  Allergen Reactions  . Diphenhydramine Hcl Hives  . Livalo [Pitavastatin Calcium] Other (See Comments)    Pt doesn't remember reaction  . Sulfa Antibiotics Other (See Comments)    Pt doesn't remember reaction  . Zocor [Simvastatin] Other (See Comments)    Pt doesn't remember reaction Leg cramping   . Actonel [Risedronate Sodium] Other (See Comments)    Esophagitis Esophagitis   . Fosamax [Alendronate Sodium] Other (See Comments)    Esophagitis  Esophagitis   . Pravastatin Other (See Comments)    Extreme leg cramping. Extreme leg cramping.    Current Outpatient Prescriptions  Medication Sig Dispense Refill  . amoxicillin-clavulanate (AUGMENTIN) 875-125 MG tablet Take 1 tablet by mouth 2 (two) times daily. 20 tablet 0  . Cholecalciferol (VITAMIN D3) 2000 UNITS TABS Take 2,000 Units by mouth daily.    Marland Kitchen donepezil (ARICEPT) 10 MG tablet Take 1 tablet (10 mg total) by mouth at bedtime. (Patient taking differently: Take 10 mg by mouth daily. ) 90 tablet 3  .  DULoxetine (CYMBALTA) 30 MG capsule TAKE ONE CAPSULE BY MOUTH ONCE DAILY 30 capsule 2  . FeFum-FePoly-FA-B Cmp-C-Biot (INTEGRA PLUS) CAPS Take 1 capsule by mouth daily. 30 capsule 3  . folic acid (FOLVITE) 1 MG tablet Take 1 mg by mouth daily.    . furosemide (LASIX) 40 MG tablet Take 1 tablet (40 mg total) by mouth 2 (two) times daily. As directed 180 tablet 0  . metoprolol succinate (TOPROL-XL) 25 MG 24 hr tablet Take 0.5 tablets (12.5 mg total) by mouth daily. 45 tablet 3  . mupirocin ointment (BACTROBAN) 2 % Apply 1 application topically 2 (two) times daily. 30 g 1  .  Triamcinolone Acetonide (TRIAMCINOLONE 0.1 % CREAM : EUCERIN) CREA Apply 1 application topically 2 (two) times daily. 480 each 0  . warfarin (COUMADIN) 5 MG tablet TAKE 1 TABLET BY MOUTH ONCE DAILY 90 tablet 0   No current facility-administered medications for this visit.     REVIEW OF SYSTEMS: see HPI for pertinent positives and negatives   Physical Examination  Vitals:   11/11/16 1606  BP: 121/69  Pulse: 64  Resp: (!) 22  Temp: (!) 97.1 F (36.2 C)  TempSrc: Oral  SpO2: 99%  Weight: 124 lb (56.2 kg)  Height: 5\' 4"  (1.626 m)   Body mass index is 21.28 kg/m.  General: The patient appears their stated age.   HEENT:  No gross abnormalities, voice is a loud whisper.  Pulmonary: Respirations are slightly labored at rest, limited air movement in all fields, faint rales in bases.  Abdomen: Soft and non-tender with normal pitched bowel sounds. Musculoskeletal: There are no major deformities.  Neurologic: No focal weakness or paresthesias are detected, is hard of hearing.  Skin: left lower leg and foot with moderate erythema with no edema, no open wounds.  Psychiatric: The patient has normal affect. Cardiovascular: There is an irregular rhythm with controlled rate, + murmur.    Vascular: Vessel Right Left  Radial 1-2+Palpable 1-2+Palpable  Carotid  without bruit  without bruit  Aorta Not palpable N/A  Femoral  3+Palpable 3+Palpable  Popliteal Not palpable Not palpable  PT Not Palpable 2+Palpable  DP Not Palpable Not Palpable   Non-Invasive Vascular Imaging   BLE Venous Insufficiency Duplex (Date: 11/11/2016):   RLE: no DVT and SVT, + GSV reflux, + deep venous reflux  LLE: no DVT and SVT, + GSV reflux, + deep venous reflux  Technically limited by pt ability to comply with requirements of the exam.   No evidence of deep or superficial thrombosis in the veins studied.  Venous incompetence of >500 ms in the right GSV, small saphenous vein, and common femoral vein.  Venous incompetence of >500 ms in the left GSV and small saphenous vein.   Medical Decision Making  Patricia Villa is a 83 y.o. female who presents with: bilateral leg chronic venous insufficiency with a history of recurrent cellulitis and keeping her lower legs in a dependent position (by her son's history). No evidence of DVT or superficial thrombosis on venous duplex today.  Pt and son given information about elevating her legs, [employer], and measuring for knee high compression hose.  Left foot and lower leg wrapped with moderate compression with an ace bandage.   Follow up as needed.   I discussed with the patient and her son the use of her 20-30 mm knee high compression stockings during the day, remove at night.   Thank you for allowing [place] to participate in this patient's care.  Cuyler Vandyken, Carma Lair, RN, MSN, FNP-C [facility] Office: [phone]  11/11/2016, 4:09 PM  Clinic MD: Early

## 2016-11-18 NOTE — Telephone Encounter (Signed)
Patient seen Vascular Dr on 07/09

## 2016-11-22 ENCOUNTER — Ambulatory Visit (INDEPENDENT_AMBULATORY_CARE_PROVIDER_SITE_OTHER): Payer: Medicare Other | Admitting: Pharmacist

## 2016-11-22 ENCOUNTER — Encounter: Payer: Self-pay | Admitting: Pharmacist

## 2016-11-22 DIAGNOSIS — I481 Persistent atrial fibrillation: Secondary | ICD-10-CM

## 2016-11-22 DIAGNOSIS — I4819 Other persistent atrial fibrillation: Secondary | ICD-10-CM

## 2016-11-22 NOTE — Patient Instructions (Signed)
Anticoagulation Warfarin Dose Instructions as of 11/22/2016      Dorene Grebe Tue Wed Thu Fri Sat   New Dose 5 mg 5 mg 5 mg 5 mg 5 mg 2.5 mg 5 mg    Description   Increase warfarin to 5mg  tablets - take 1 tablet daily except take 1/2 tablet on Fridays.  INR today:  1.9  (goal: 2.0 to 3.0)

## 2016-11-25 LAB — COAGUCHEK XS/INR WAIVED
INR: 1.9 — AB (ref 0.9–1.1)
Prothrombin Time: 23.2 s

## 2016-11-29 ENCOUNTER — Other Ambulatory Visit: Payer: Self-pay | Admitting: Family Medicine

## 2016-11-29 ENCOUNTER — Encounter: Payer: Medicare Other | Admitting: Vascular Surgery

## 2016-11-29 ENCOUNTER — Inpatient Hospital Stay (HOSPITAL_COMMUNITY)
Admission: RE | Admit: 2016-11-29 | Discharge: 2016-11-29 | Disposition: A | Discharge status: Home / Self Care | Payer: Medicare Other | Source: Ambulatory Visit | Attending: Vascular Surgery | Admitting: Vascular Surgery

## 2016-11-29 DIAGNOSIS — R609 Edema, unspecified: Secondary | ICD-10-CM

## 2016-12-06 ENCOUNTER — Other Ambulatory Visit: Payer: Self-pay | Admitting: Family Medicine

## 2016-12-12 ENCOUNTER — Encounter: Payer: Self-pay | Admitting: Pharmacist

## 2016-12-13 ENCOUNTER — Ambulatory Visit: Payer: Medicare Other | Admitting: Family Medicine

## 2016-12-13 DIAGNOSIS — R040 Epistaxis: Secondary | ICD-10-CM | POA: Diagnosis not present

## 2016-12-19 ENCOUNTER — Other Ambulatory Visit: Payer: Self-pay | Admitting: Family Medicine

## 2016-12-25 ENCOUNTER — Other Ambulatory Visit: Payer: Self-pay | Admitting: Family Medicine

## 2016-12-27 ENCOUNTER — Telehealth: Payer: Self-pay | Admitting: *Deleted

## 2016-12-27 ENCOUNTER — Encounter: Payer: Self-pay | Admitting: Family Medicine

## 2016-12-27 ENCOUNTER — Ambulatory Visit (INDEPENDENT_AMBULATORY_CARE_PROVIDER_SITE_OTHER): Payer: Medicare Other | Admitting: Family Medicine

## 2016-12-27 VITALS — BP 90/53 | HR 69 | Temp 97.4°F | Ht 64.0 in | Wt 132.0 lb

## 2016-12-27 DIAGNOSIS — D508 Other iron deficiency anemias: Secondary | ICD-10-CM

## 2016-12-27 DIAGNOSIS — R2681 Unsteadiness on feet: Secondary | ICD-10-CM | POA: Diagnosis not present

## 2016-12-27 DIAGNOSIS — I878 Other specified disorders of veins: Secondary | ICD-10-CM | POA: Diagnosis not present

## 2016-12-27 DIAGNOSIS — I481 Persistent atrial fibrillation: Secondary | ICD-10-CM | POA: Diagnosis not present

## 2016-12-27 DIAGNOSIS — E559 Vitamin D deficiency, unspecified: Secondary | ICD-10-CM

## 2016-12-27 DIAGNOSIS — I83003 Varicose veins of unspecified lower extremity with ulcer of ankle: Secondary | ICD-10-CM | POA: Diagnosis not present

## 2016-12-27 DIAGNOSIS — I10 Essential (primary) hypertension: Secondary | ICD-10-CM

## 2016-12-27 DIAGNOSIS — K439 Ventral hernia without obstruction or gangrene: Secondary | ICD-10-CM

## 2016-12-27 DIAGNOSIS — E7800 Pure hypercholesterolemia, unspecified: Secondary | ICD-10-CM | POA: Diagnosis not present

## 2016-12-27 DIAGNOSIS — I421 Obstructive hypertrophic cardiomyopathy: Secondary | ICD-10-CM

## 2016-12-27 DIAGNOSIS — L97301 Non-pressure chronic ulcer of unspecified ankle limited to breakdown of skin: Secondary | ICD-10-CM | POA: Diagnosis not present

## 2016-12-27 DIAGNOSIS — R040 Epistaxis: Secondary | ICD-10-CM

## 2016-12-27 DIAGNOSIS — I4819 Other persistent atrial fibrillation: Secondary | ICD-10-CM

## 2016-12-27 LAB — COAGUCHEK XS/INR WAIVED
INR: 3.9 — ABNORMAL HIGH (ref 0.9–1.1)
Prothrombin Time: 46.6 s

## 2016-12-27 MED ORDER — INTEGRA PLUS PO CAPS: 1.0000 | ORAL_CAPSULE | Freq: Every day | ORAL | 3 refills | Status: DC

## 2016-12-27 NOTE — Progress Notes (Signed)
Subjective:    Patient ID: Patricia Villa, female    DOB: 07-10-1932, 83 y.o.   MRN: [id number]  HPI Pt here for follow up and management of chronic medical problems which includes  A fib, hyperlipidemia and hypertension. She is taking medication regularly. The patient complains of more fatigue recently and sleeping more the past couple weeks. This is noted by her caregiver and son. She also has ongoing complaints with her leg swelling and some back pain. She did see the vascular and vein specialist in early July and it was recommended that she use 20-30 mm knee-high compression stockings during the day and remove them at night and she did have at that visit Dopplers that were negative for DVT. The pro time done today was 210 with an INR that was too high. This will be adjusted before she leaves office. The patient denies any chest pain and no more shortness of breath than usual. She continues to have problems with her Clarinex and any surgery that was planned has been postponed in the past and the cardiologist has indicated that he felt like she was fine for the surgery but he may need to reevaluate her since that was a pretty good while ago. She denies any trouble with swallowing but does complain of cough and congestion. She also has nosebleeds that may be aggravated by wearing her O2 that she needs at home with nasal prongs. We will have the oxygen supplier to reevaluate her and see if there is a different apparatus that she could use to deliver oxygen that would not have to be placed in the nasal passages so as to reduce the irritation and therefore having less nosebleeds. This was discussed with the patient and her son during the visit. The patient denies any trouble with her bowel movements and to her knowledge has not seen any blood in the stool or had any black tarry bowel movements. She is also passing her water without problems. Her pulse ox in the room without the oxygen going for short period  time today was 94%. The initial blood pressure was 90/53 and in reviewing her medicine she is taking metoprolol for rate control and taking Lasix because of ongoing edema in her legs and she needs both of these medicines.     Patient Active Problem List   Diagnosis Date Noted  . Secondary pulmonary arterial hypertension ([place]) 07/05/2016  . Dysuria 06/12/2016  . History of cystocele 06/12/2016  . Vocal cord paralysis, unilateral complete   . Vertebrobasilar insufficiency   . Stroke due to embolism of left cerebellar artery ([place])   . Aortic atherosclerosis ([place]) 11/20/2015  . Atrial fibrillation, persistent ([place]) 10/30/2015  . Atrial fibrillation ([place]) 11/23/2014  . Osteoporosis 08/31/2014  . Chronic neck pain 06/07/2014  . DDD (degenerative disc disease), cervical 01/24/2014  . Hyperlipidemia 05/17/2013  . Colitis 01/20/2013  . Generalized anxiety disorder 08/05/2012  . Meningioma ([place]) 08/05/2012  . Bruit 10/17/2010  . Essential hypertension, benign 10/04/2009  . MITRAL INSUFFICIENCY 09/26/2009  . Mitral valve disorder 09/26/2009  . Hypertrophic obstructive cardiomyopathy ([place]) 09/26/2009   Outpatient Encounter Prescriptions as of 12/27/2016  Medication Sig  . Cholecalciferol (VITAMIN D3) 2000 UNITS TABS Take 2,000 Units by mouth daily.  Marland Kitchen donepezil (ARICEPT) 10 MG tablet Take 1 tablet (10 mg total) by mouth at bedtime. (Patient taking differently: Take 10 mg by mouth daily. )  . DULoxetine (CYMBALTA) 30 MG capsule TAKE 1 CAPSULE BY MOUTH ONCE DAILY  .  FeFum-FePoly-FA-B Cmp-C-Biot (INTEGRA PLUS) CAPS Take 1 capsule by mouth daily.  . folic acid (FOLVITE) 1 MG tablet Take 1 mg by mouth daily.  . furosemide (LASIX) 40 MG tablet Take 1 tablet (40 mg total) by mouth 2 (two) times daily. As directed  . metoprolol succinate (TOPROL-XL) 25 MG 24 hr tablet TAKE ONE-HALF TABLET BY MOUTH ONCE DAILY  . mupirocin ointment (BACTROBAN) 2 % Apply 1 application topically 2 (two) times daily.  .  Triamcinolone Acetonide (TRIAMCINOLONE 0.1 % CREAM : EUCERIN) CREA Apply 1 application topically 2 (two) times daily.  Marland Kitchen warfarin (COUMADIN) 5 MG tablet TAKE 1 TABLET BY MOUTH ONCE DAILY  . [DISCONTINUED] FeFum-FePoly-FA-B Cmp-C-Biot (INTEGRA PLUS) CAPS TAKE 1 CAPSULE BY MOUTH ONCE DAILY   No facility-administered encounter medications on file as of 12/27/2016.      Review of Systems  Constitutional: Positive for fatigue (sleeping more in the last 2 weeks).  HENT: Negative.   Eyes: Negative.   Respiratory: Negative.   Cardiovascular: Positive for leg swelling.  Gastrointestinal: Negative.   Endocrine: Negative.   Genitourinary: Negative.   Musculoskeletal: Positive for back pain.  Skin: Negative.   Allergic/Immunologic: Negative.   Neurological: Negative.   Hematological: Negative.   Psychiatric/Behavioral: Negative.        Objective:   Physical Exam  Constitutional: She is oriented to person, place, and time. No distress.  Patient is elderly and kyphotic and uses a walker for ambulation she seems to be fairly alert and that she knew the names of physicians and things that were going on with her and her husband.  HENT:  Head: Normocephalic and atraumatic.  Right Ear: External ear normal.  Left Ear: External ear normal.  Mouth/Throat: Oropharynx is clear and moist.  Nasal septal areas of redness without active bleeding. Throat appeared normal.  Eyes: Pupils are equal, round, and reactive to light. Conjunctivae and EOM are normal. Right eye exhibits no discharge. Left eye exhibits no discharge. No scleral icterus.  Neck: Normal range of motion. Neck supple. No thyromegaly present.  No bruits thyromegaly or anterior cervical adenopathy  Cardiovascular: Normal rate and normal heart sounds.   No murmur heard. The heart had an irregular irregular rhythm at 84/m. she should continues to have stasis ulcers in her left lower extremity that weep and there were 3 noted today. I  reemphasized to her the importance of elevating her feet and wearing her compression stockings. Also emphasized the importance to not be picking at these lesions as they will never heal in that situation.  Pulmonary/Chest: Effort normal and breath sounds normal. No respiratory distress. She has no wheezes. She has no rales.  Clear anteriorly and posteriorly.  Abdominal: Soft. Bowel sounds are normal. She exhibits no mass. There is no tenderness. There is no rebound and no guarding.  The patient does have a ventral hernia just above the umbilicus and a slight umbilical hernia. She said this first occurred after a fall not too long ago. It it is painful and burning and stinging at times. The patient's son is aware of this and he understands that if she develops severe pain that she should get to the emergency room quickly. He does agree to let us have a Psychologist, sport and exercise to look at this and make a decision if she needs to have this for repair or not.  Musculoskeletal: She exhibits edema, tenderness and deformity.  The patient also has varicosities on both lower extremities. The patient has severe kyphosis from osteoporosis and uses  a walker for ambulation she also has low back pain and has trouble laying on the table because of the pain.  Lymphadenopathy:    She has no cervical adenopathy.  Neurological: She is alert and oriented to person, place, and time.  Skin: Skin is warm and dry. Rash noted. There is erythema. No pallor.  The patient has venous stasis ulcers of the left lower extremity with clear serous fluid leaking. She also has varicosities of both lower extremities and these are somewhat sensitive  Psychiatric: She has a normal mood and affect. Her behavior is normal. Judgment and thought content normal.  She definitely seems to be in better spirits and mood today.  Nursing note and vitals reviewed.  BP (!) 90/53 (BP Location: Left Arm)   Pulse 69   Temp (!) 97.4 F (36.3 C) (Oral)   Ht _0   (1.626 m)   Wt 132 lb (59.9 kg)   LMP 08/01/1982   BMI 22.66 kg/m   She is still interested in having repair of her larynx by the otolaryngologist. The cardiologist has said that she was okay to have this done and that has been several weeks ago. We will get her blood work back and make sure that her hemoglobin is improved and that her electrolytes are good and we will discuss this with the cardiologist again and then hopefully speak with her otolaryngologist about revisiting the surgical procedure. She will stay on her iron preparation.      Assessment & Plan:  1. Atrial fibrillation, persistent ([place]) -The patient remains in atrial fibrillation at about 84/m. We will check with the cardiologist and make sure that another visit is not planned for her anytime soon. If not we will also discuss with him the appropriateness of her having surgery on her voice issues. - CBC with Differential/Platelet - CoaguChek XS/INR Waived  2. Pure hypercholesterolemia -Continue with pravastatin and therapeutic lifestyle changes - CBC with Differential/Platelet - Lipid panel  3. Vitamin D deficiency -Continue with vitamin D replacement and calcium pending results of lab work - CBC with Differential/Platelet - VITAMIN D 25 Hydroxy (Vit-D Deficiency, Fractures)  4. Gait instability -Continue using walker - CBC with Differential/Platelet  5. Essential hypertension, benign -Blood pressure is in fact on the low end of the range but I'm hesitant to decrease the metoprolol or the Lasix based on her current needs to control rate and fluid retention. - BMP8+EGFR - CBC with Differential/Platelet - Hepatic function panel  6. Venous stasis ulcer of ankle limited to breakdown of skin with varicose veins, unspecified laterality ([place]) -Continue with support hose and elevation of legs during the day  7. Cardiomyopathy, hypertrophic obstructive ([place]) -Follow-up with cardiology as planned -Continue O2 at 2  L/m  8. Ventral hernia without obstruction or gangrene -Schedule visit with general surgeon for further evaluation of this hernia which is causing some discomfort.  9. Nosebleed -The patient needs her oxygen. We need to [place] discuss with the supplier to find a better apparatus that will not require nasal prongs since she has had frequent nosebleeds. We will try to contact the supplier. She needs to continue with the oxygen because of her heart condition in her weakened status. She is making improvements.  10. Venous stasis of lower extremity -Continue with support hose elevation of feet and periodic visits to vascular surgeon's office if needed  11. Iron deficiency anemia secondary to inadequate dietary iron intake -Continue with Integra plus -Get lab work and iron studies -Continue  with O2 to improve adequate circulation to her legs and for her cardiomyopathy  Meds ordered this encounter  Medications  . FeFum-FePoly-FA-B Cmp-C-Biot (INTEGRA PLUS) CAPS    Sig: Take 1 capsule by mouth daily.    Dispense:  90 capsule    Refill:  3   Patient Instructions                        Medicare Annual Wellness Visit  [facility] and the medical providers at [facility] strive to bring you the best medical care.  In doing so we not only want to address your current medical conditions and concerns but also to detect new conditions early and prevent illness, disease and health-related problems.    Medicare offers a yearly Wellness Visit which allows our clinical staff to assess your need for preventative services including immunizations, lifestyle education, counseling to decrease risk of preventable diseases and screening for fall risk and other medical concerns.    This visit is provided free of charge (no copay) for all Medicare recipients. The clinical pharmacists at [facility] have begun to conduct these Wellness Visits which will also include a  thorough review of all your medications.    As you primary medical provider recommend that you make an appointment for your Annual Wellness Visit if you have not done so already this year.  You may set up this appointment before you leave today or you may call back ([phone]) and schedule an appointment.  Please make sure when you call that you mention that you are scheduling your Annual Wellness Visit with the clinical pharmacist so that the appointment may be made for the proper length of time.     Continue current medications. Continue good therapeutic lifestyle changes which include good diet and exercise. Fall precautions discussed with patient. If an FOBT was given today- please return it to our front desk. If you are over 7 years old - you may need Prevnar 42 or the adult Pneumonia vaccine.  **Flu shots are available--- please call and schedule a FLU-CLINIC appointment**  After your visit with [place] today you will receive a survey in the mail or online from [employer] regarding your care with [place]. Please take a moment to fill this out. Your feedback is very important to [place] as you can help [place] better understand your patient needs as well as improve your experience and satisfaction. WE CARE ABOUT YOU!!!    Anticoagulation Warfarin Dose Instructions as of 12/27/2016      Dorene Grebe Tue Wed Thu Fri Sat   New Dose 5 mg 2.5 mg 5 mg 5 mg 5 mg 2.5 mg 5 mg    Description   continue warfarin to 68m tablets - take 1 tablet daily except take 1/2 tablet on Fridays and MONDAYS  INR today:  3.9  (goal: 2.0 to 3.0)      DArrie SenateMD

## 2016-12-27 NOTE — Addendum Note (Signed)
Addended by: Zannie Cove on: 12/27/2016 03:00 PM   Modules accepted: Orders

## 2016-12-27 NOTE — Telephone Encounter (Signed)
Contacted [employer] [phone] They will be shipping her out a new water bottle for her oxygen tank as well as canula & a mask breather. Son Doren Custard is aware these are being shipped. They have not changed the water bottle since she received the oxygen almost a year ago, suggested to see if this helps

## 2016-12-27 NOTE — Patient Instructions (Addendum)
Medicare Annual Wellness Visit  [facility] and the medical providers at [facility] strive to bring you the best medical care.  In doing so we not only want to address your current medical conditions and concerns but also to detect new conditions early and prevent illness, disease and health-related problems.    Medicare offers a yearly Wellness Visit which allows our clinical staff to assess your need for preventative services including immunizations, lifestyle education, counseling to decrease risk of preventable diseases and screening for fall risk and other medical concerns.    This visit is provided free of charge (no copay) for all Medicare recipients. The clinical pharmacists at [facility] have begun to conduct these Wellness Visits which will also include a thorough review of all your medications.    As you primary medical provider recommend that you make an appointment for your Annual Wellness Visit if you have not done so already this year.  You may set up this appointment before you leave today or you may call back ([phone]) and schedule an appointment.  Please make sure when you call that you mention that you are scheduling your Annual Wellness Visit with the clinical pharmacist so that the appointment may be made for the proper length of time.     Continue current medications. Continue good therapeutic lifestyle changes which include good diet and exercise. Fall precautions discussed with patient. If an FOBT was given today- please return it to our front desk. If you are over 70 years old - you may need Prevnar 61 or the adult Pneumonia vaccine.  **Flu shots are available--- please call and schedule a FLU-CLINIC appointment**  After your visit with [place] today you will receive a survey in the mail or online from [employer] regarding your care with [place]. Please take a moment to fill this out. Your feedback is very  important to [place] as you can help [place] better understand your patient needs as well as improve your experience and satisfaction. WE CARE ABOUT YOU!!!    Anticoagulation Warfarin Dose Instructions as of 12/27/2016      Dorene Grebe Tue Wed Thu Fri Sat   New Dose 5 mg 2.5 mg 5 mg 5 mg 5 mg 2.5 mg 5 mg    Description   continue warfarin to 5mg  tablets - take 1 tablet daily except take 1/2 tablet on Fridays and MONDAYS  INR today:  3.9  (goal: 2.0 to 3.0)

## 2016-12-28 LAB — CBC WITH DIFFERENTIAL/PLATELET
BASOS ABS: 0.1 10*3/uL (ref 0.0–0.2)
Basos: 1 %
EOS (ABSOLUTE): 0.1 10*3/uL (ref 0.0–0.4)
Eos: 1 %
HEMOGLOBIN: 11.6 g/dL (ref 11.1–15.9)
Hematocrit: 37 % (ref 34.0–46.6)
Immature Grans (Abs): 0 10*3/uL (ref 0.0–0.1)
Immature Granulocytes: 0 %
LYMPHS ABS: 1.3 10*3/uL (ref 0.7–3.1)
Lymphs: 24 %
MCH: 30.9 pg (ref 26.6–33.0)
MCHC: 31.4 g/dL — AB (ref 31.5–35.7)
MCV: 99 fL — ABNORMAL HIGH (ref 79–97)
Monocytes Absolute: 0.8 10*3/uL (ref 0.1–0.9)
Monocytes: 16 %
NEUTROS ABS: 3.2 10*3/uL (ref 1.4–7.0)
NEUTROS PCT: 58 %
Platelets: 231 10*3/uL (ref 150–379)
RBC: 3.75 x10E6/uL — ABNORMAL LOW (ref 3.77–5.28)
RDW: 16.3 % — ABNORMAL HIGH (ref 12.3–15.4)
WBC: 5.4 10*3/uL (ref 3.4–10.8)

## 2016-12-28 LAB — BMP8+EGFR
BUN/Creatinine Ratio: 22 (ref 12–28)
BUN: 21 mg/dL (ref 8–27)
CO2: 25 mmol/L (ref 20–29)
CREATININE: 0.95 mg/dL (ref 0.57–1.00)
Calcium: 9.2 mg/dL (ref 8.7–10.3)
Chloride: 98 mmol/L (ref 96–106)
GFR calc Af Amer: 65 mL/min/{1.73_m2} (ref >59)
GFR calc non Af Amer: 56 mL/min/{1.73_m2} — ABNORMAL LOW (ref >59)
GLUCOSE: 94 mg/dL (ref 65–99)
POTASSIUM: 3.9 mmol/L (ref 3.5–5.2)
SODIUM: 137 mmol/L (ref 134–144)

## 2016-12-28 LAB — HEPATIC FUNCTION PANEL
ALK PHOS: 100 IU/L (ref 39–117)
ALT: 19 IU/L (ref 0–32)
AST: 42 IU/L — AB (ref 0–40)
Albumin: 3.7 g/dL (ref 3.5–4.7)
BILIRUBIN TOTAL: 1.5 mg/dL — AB (ref 0.0–1.2)
BILIRUBIN, DIRECT: 0.66 mg/dL — AB (ref 0.00–0.40)
TOTAL PROTEIN: 7.3 g/dL (ref 6.0–8.5)

## 2016-12-28 LAB — LIPID PANEL
CHOLESTEROL TOTAL: 89 mg/dL — AB (ref 100–199)
Chol/HDL Ratio: 2.3 ratio (ref 0.0–4.4)
HDL: 39 mg/dL — ABNORMAL LOW (ref >39)
LDL CALC: 35 mg/dL (ref 0–99)
Triglycerides: 77 mg/dL (ref 0–149)
VLDL Cholesterol Cal: 15 mg/dL (ref 5–40)

## 2016-12-28 LAB — VITAMIN D 25 HYDROXY (VIT D DEFICIENCY, FRACTURES): VIT D 25 HYDROXY: 53.1 ng/mL (ref 30.0–100.0)

## 2016-12-31 ENCOUNTER — Other Ambulatory Visit: Payer: Self-pay | Admitting: Family Medicine

## 2016-12-31 ENCOUNTER — Telehealth: Payer: Self-pay | Admitting: Family Medicine

## 2016-12-31 NOTE — Telephone Encounter (Signed)
We will call son phil - with report

## 2017-01-02 ENCOUNTER — Telehealth: Payer: Self-pay | Admitting: Family Medicine

## 2017-01-02 DIAGNOSIS — J3801 Paralysis of vocal cords and larynx, unilateral: Secondary | ICD-10-CM | POA: Diagnosis not present

## 2017-01-02 DIAGNOSIS — R1310 Dysphagia, unspecified: Secondary | ICD-10-CM | POA: Diagnosis not present

## 2017-01-02 DIAGNOSIS — R490 Dysphonia: Secondary | ICD-10-CM | POA: Diagnosis not present

## 2017-01-02 NOTE — Telephone Encounter (Signed)
called son ,aware of results

## 2017-01-06 ENCOUNTER — Other Ambulatory Visit: Payer: Self-pay | Admitting: Family Medicine

## 2017-01-09 ENCOUNTER — Ambulatory Visit (INDEPENDENT_AMBULATORY_CARE_PROVIDER_SITE_OTHER): Payer: Medicare Other | Admitting: *Deleted

## 2017-01-09 DIAGNOSIS — I83023 Varicose veins of left lower extremity with ulcer of ankle: Secondary | ICD-10-CM

## 2017-01-09 DIAGNOSIS — L97321 Non-pressure chronic ulcer of left ankle limited to breakdown of skin: Secondary | ICD-10-CM | POA: Diagnosis not present

## 2017-01-09 DIAGNOSIS — I481 Persistent atrial fibrillation: Secondary | ICD-10-CM

## 2017-01-09 DIAGNOSIS — I4819 Other persistent atrial fibrillation: Secondary | ICD-10-CM

## 2017-01-09 LAB — COAGUCHEK XS/INR WAIVED
INR: 4.2 — ABNORMAL HIGH (ref 0.9–1.1)
PROTHROMBIN TIME: 49.8 s

## 2017-01-09 NOTE — Patient Instructions (Addendum)
Anticoagulation Warfarin Dose Instructions as of 01/09/2017      Dorene Grebe Tue Wed Thu Fri Sat   New Dose 5 mg 2.5 mg 5 mg 5 mg 5 mg 2.5 mg 5 mg    Description   Hold coumadin for 2 doses and then resume normal schedule of 1 tablet daily except 1/2 tablet on Monday and Friday.   INR today:  4.2  (goal: 2.0 to 3.0)     Elevate foot when sitting. Leave UNNA boot on for at least several days.

## 2017-01-10 NOTE — Progress Notes (Signed)
Subjective:     Indication: atrial fibrillation Bleeding signs/symptoms: None Thromboembolic signs/symptoms: None  Missed Coumadin doses: None Medication changes: no-Not that she or her caregiver are aware of. Her son places medication in pill box. Dietary changes: no Bacterial/viral infection: no Other concerns: no  The following portions of the patient's history were reviewed and updated as appropriate: allergies, past family history, past medical history, past social history, past surgical history and problem list.  Review of Systems Left lower extremity venous stasis ulcers. This is a chronic problem and patient has been evaluated by  PCP and referred to vascular surgeon. She has not seen wound care.   Objective:    INR Today: 4.2 Current dose: 5  mg daily except for 2.5 mg on Mon and Friday.  Left lower extremity purple discoloration with multiple small ulcerations. Minimal drainage. No warmth, redness, swelling, or pain.   Assessment:    Supratherapeutic INR for goal of 2-3   Plan:    1. New dose: Hold coumadin for 2 doses and then resume normal schedule of 1 tablet daily except for 1/2 tablet on Mon and Friday.    2. Next INR: 2 weeks  UNNA boot applied to left lower leg. Advised to leave on for a week if tolerated. If not, at least several days. Elevate foot when sitting.   Discussed with Redge Gainer, MD  Chong Sicilian, RN  Arrie Senate MD

## 2017-01-13 ENCOUNTER — Other Ambulatory Visit: Payer: Self-pay | Admitting: *Deleted

## 2017-01-13 MED ORDER — WARFARIN SODIUM 5 MG PO TABS: 5.0000 mg | ORAL_TABLET | Freq: Every day | ORAL | 1 refills | Status: DC

## 2017-01-21 ENCOUNTER — Ambulatory Visit (INDEPENDENT_AMBULATORY_CARE_PROVIDER_SITE_OTHER): Payer: Medicare Other | Admitting: *Deleted

## 2017-01-21 DIAGNOSIS — I4819 Other persistent atrial fibrillation: Secondary | ICD-10-CM

## 2017-01-21 DIAGNOSIS — I481 Persistent atrial fibrillation: Secondary | ICD-10-CM

## 2017-01-21 DIAGNOSIS — I878 Other specified disorders of veins: Secondary | ICD-10-CM | POA: Diagnosis not present

## 2017-01-21 LAB — COAGUCHEK XS/INR WAIVED
INR: 4.2 — ABNORMAL HIGH (ref 0.9–1.1)
Prothrombin Time: 50.5 s

## 2017-01-21 MED ORDER — METOLAZONE 2.5 MG PO TABS: 2.5000 mg | ORAL_TABLET | Freq: Every day | ORAL | 0 refills | Status: DC

## 2017-01-21 NOTE — Patient Instructions (Signed)
Anticoagulation Warfarin Dose Instructions as of 01/21/2017      Patricia Villa Tue Wed Thu Fri Sat   New Dose 2.5 mg 5 mg 2.5 mg 5 mg 2.5 mg 5 mg 2.5 mg    Description   Take 1 whole tablet on Mon, Wed, Fri and 1/2 tablet on Sun, Tues, Thurs, and Sat  INR today:  4.2  (goal: 2.0 to 3.0)  Return on 02/21/17 at 12:00

## 2017-01-21 NOTE — Progress Notes (Signed)
Subjective:     Indication: atrial fibrillation Bleeding signs/symptoms: None Thromboembolic signs/symptoms: None  Missed Coumadin doses: None Medication changes: Coumadin was held for two doses to adjust INR Dietary changes: no Bacterial/viral infection: no Other concerns: yes - Venous stasis ulcers LLE and some weeping edema. (Discussed with Chevis Pretty, FNP-Additional notes below)  The following portions of the patient's history were reviewed and updated as appropriate: past family history, past medical history, past social history, past surgical history and problem list.    Objective:    INR Today: 4.2 Current dose: 5 mg daily except 2.5mg  on Mon and Fri  Assessment:    Supratherapeutic INR for goal of 2-3   Plan:    1. New dose: 5mg  on Mon, Wed, Fri and 2.5mg  all other days   2. Next INR: 1 month    Discussed with Dr Dettinger  Chong Sicilian, RN

## 2017-01-22 ENCOUNTER — Encounter: Payer: Self-pay | Admitting: *Deleted

## 2017-01-22 ENCOUNTER — Telehealth: Payer: Self-pay | Admitting: Cardiology

## 2017-01-22 DIAGNOSIS — I080 Rheumatic disorders of both mitral and aortic valves: Secondary | ICD-10-CM

## 2017-01-22 DIAGNOSIS — I059 Rheumatic mitral valve disease, unspecified: Secondary | ICD-10-CM

## 2017-01-22 NOTE — Telephone Encounter (Signed)
Received call from [facility] with Dr.Rinaldi's office.She stated patient is due for echo and follow up.Stated son is not able to bring her to their office to have done.Stated she had mitral valve repair last year.She wanted to make sure we could do echo.Advised our scheduler will call patient to schedule echo and follow up with Dr.Hochrein.

## 2017-01-22 NOTE — Progress Notes (Signed)
Subjective: Hx of  lower extremity edema and venous stasis. Taking Lasix 40mg  BID.  She was wearing support hose to the visit but did not have them on earlier today when her caregiver arrived. Caregiver concerned about left lower extremity swelling.   Objective: Pitting, weeping edema LLE with mild blistering, redness, and warmth of anterior lower leg. Swelling was circumferential. No pain, redness, or warmth in posterior calf.   Assessment: Venous stasis, possible beginning of cellulitis (will monitor)  Plan: discussed with Chevis Pretty, FNP Louretta Parma boot placed   Elevate legs when sitting Zaroxolyn 2.5mg  tablets. Take 1 tablet by mouth daily x 2 days.  Appt scheduled with Shelah Lewandowsky for f/u on 01/23/17 Call our office if symptoms worsen or fever develops  Chong Sicilian, RN  [place], FNP

## 2017-01-23 ENCOUNTER — Ambulatory Visit (INDEPENDENT_AMBULATORY_CARE_PROVIDER_SITE_OTHER): Payer: Medicare Other | Admitting: Nurse Practitioner

## 2017-01-23 ENCOUNTER — Encounter: Payer: Self-pay | Admitting: Nurse Practitioner

## 2017-01-23 VITALS — BP 128/67 | HR 66 | Temp 96.8°F | Ht 64.0 in | Wt 135.0 lb

## 2017-01-23 DIAGNOSIS — I878 Other specified disorders of veins: Secondary | ICD-10-CM | POA: Diagnosis not present

## 2017-01-23 MED ORDER — V-2 HIGH COMPRESSION HOSE MISC: 0 refills | Status: DC

## 2017-01-23 NOTE — Patient Instructions (Signed)

## 2017-01-23 NOTE — Progress Notes (Signed)
   Subjective:    Patient ID: Patricia Villa, female    DOB: 1933-02-12, 83 y.o.   MRN: [id number]  HPI Patient was seen Tuesday for INR- at that time her left lower ext was red an swollen. We added zaroxlyn for 2 days and wrap leg with unna boot. She is here  Today for recheck.    Review of Systems  Constitutional: Negative.   HENT: Negative.   Respiratory: Negative.   Cardiovascular: Negative.  Negative for leg swelling.  Musculoskeletal: Negative.   Neurological: Negative.   Psychiatric/Behavioral: Negative.   All other systems reviewed and are negative.      Objective:   Physical Exam  Constitutional: She appears well-developed and well-nourished. No distress.  Cardiovascular: Normal rate.   Pulmonary/Chest: Effort normal and breath sounds normal.  Musculoskeletal: Edema: lower ext no edema today.  Skin:  bil lower ext are cyanotic and cool to touch- faint pedal pulses bil  Psychiatric: She has a normal mood and affect. Her behavior is normal. Judgment and thought content normal.    BP 128/67   Pulse 66   Temp (!) 96.8 F (36 C) (Oral)   Ht 5\' 4"  (1.626 m)   Wt 135 lb (61.2 kg)   LMP 08/01/1982   BMI 23.17 kg/m       Assessment & Plan:   1. Venous stasis    Hold zaroxlyn elevate legs when sitting Compression hose daily Keep follow up appointment with Dr. Mayra Neer, FNP

## 2017-01-28 ENCOUNTER — Ambulatory Visit: Payer: Medicare Other | Admitting: Nurse Practitioner

## 2017-01-31 DIAGNOSIS — K439 Ventral hernia without obstruction or gangrene: Secondary | ICD-10-CM | POA: Diagnosis not present

## 2017-02-05 ENCOUNTER — Other Ambulatory Visit (HOSPITAL_COMMUNITY): Payer: Medicare Other

## 2017-02-06 ENCOUNTER — Other Ambulatory Visit: Payer: Self-pay | Admitting: Family Medicine

## 2017-02-12 ENCOUNTER — Other Ambulatory Visit: Payer: Self-pay | Admitting: Family Medicine

## 2017-02-15 ENCOUNTER — Ambulatory Visit (INDEPENDENT_AMBULATORY_CARE_PROVIDER_SITE_OTHER): Payer: Medicare Other | Admitting: Physician Assistant

## 2017-02-15 VITALS — BP 89/52 | HR 56 | Ht 64.0 in | Wt 129.0 lb

## 2017-02-15 DIAGNOSIS — R413 Other amnesia: Secondary | ICD-10-CM | POA: Diagnosis not present

## 2017-02-15 DIAGNOSIS — M546 Pain in thoracic spine: Secondary | ICD-10-CM

## 2017-02-15 DIAGNOSIS — S0012XA Contusion of left eyelid and periocular area, initial encounter: Secondary | ICD-10-CM

## 2017-02-15 DIAGNOSIS — G8929 Other chronic pain: Secondary | ICD-10-CM | POA: Diagnosis not present

## 2017-02-15 DIAGNOSIS — S7001XA Contusion of right hip, initial encounter: Secondary | ICD-10-CM
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
## 2017-02-15 NOTE — Patient Instructions (Signed)
In a few days you may receive a survey in the mail or online from [employer] regarding your visit with us today. Please take a moment to fill this out. Your feedback is very important to our whole office. It can help us better understand your needs as well as improve your experience and satisfaction. Thank you for taking your time to complete it. We care about you.  Samentha Perham, PA-C  

## 2017-02-17 ENCOUNTER — Other Ambulatory Visit (INDEPENDENT_AMBULATORY_CARE_PROVIDER_SITE_OTHER): Payer: Medicare Other

## 2017-02-17 ENCOUNTER — Other Ambulatory Visit: Payer: Self-pay | Admitting: Physician Assistant

## 2017-02-17 DIAGNOSIS — M546 Pain in thoracic spine: Principal | ICD-10-CM

## 2017-02-17 DIAGNOSIS — G8929 Other chronic pain: Secondary | ICD-10-CM | POA: Diagnosis not present

## 2017-02-17 DIAGNOSIS — S7001XA Contusion of right hip, initial encounter: Secondary | ICD-10-CM | POA: Diagnosis not present

## 2017-02-21 ENCOUNTER — Ambulatory Visit (INDEPENDENT_AMBULATORY_CARE_PROVIDER_SITE_OTHER): Payer: Medicare Other | Admitting: Pharmacist Clinician (PhC)/ Clinical Pharmacy Specialist

## 2017-02-21 DIAGNOSIS — I481 Persistent atrial fibrillation: Secondary | ICD-10-CM | POA: Diagnosis not present

## 2017-02-21 DIAGNOSIS — Z0289 Encounter for other administrative examinations: Secondary | ICD-10-CM

## 2017-02-21 DIAGNOSIS — I4819 Other persistent atrial fibrillation: Secondary | ICD-10-CM

## 2017-02-21 LAB — COAGUCHEK XS/INR WAIVED
INR: 3.8 — ABNORMAL HIGH (ref 0.9–1.1)
Prothrombin Time: 45.6 s

## 2017-02-21 NOTE — Patient Instructions (Signed)
Anticoagulation Warfarin Dose Instructions as of 02/21/2017      Dorene Grebe Tue Wed Thu Fri Sat   New Dose 2.5 mg 2.5 mg 2.5 mg 2.5 mg 2.5 mg 2.5 mg 2.5 mg    Description   No coumadin today and tomorrow.  Then take 1/2 tablet every day of the week.  INR today:  3.8  (goal: 2.0 to 3.0)  Return on 02/21/17 at 12:00

## 2017-02-25 ENCOUNTER — Ambulatory Visit: Payer: Medicare Other | Admitting: Family Medicine

## 2017-03-07 ENCOUNTER — Ambulatory Visit (INDEPENDENT_AMBULATORY_CARE_PROVIDER_SITE_OTHER): Payer: Medicare Other | Admitting: Pediatrics

## 2017-03-07 ENCOUNTER — Encounter: Payer: Self-pay | Admitting: Pediatrics

## 2017-03-07 ENCOUNTER — Ambulatory Visit: Payer: Medicare Other

## 2017-03-07 VITALS — BP 108/60 | HR 77 | Temp 98.9°F | Ht 64.0 in | Wt 126.0 lb

## 2017-03-07 DIAGNOSIS — I739 Peripheral vascular disease, unspecified: Secondary | ICD-10-CM | POA: Diagnosis not present

## 2017-03-07 DIAGNOSIS — R296 Repeated falls: Secondary | ICD-10-CM

## 2017-03-07 DIAGNOSIS — L989 Disorder of the skin and subcutaneous tissue, unspecified: Secondary | ICD-10-CM

## 2017-03-07 DIAGNOSIS — M533 Sacrococcygeal disorders, not elsewhere classified: Secondary | ICD-10-CM

## 2017-03-07 NOTE — Patient Instructions (Signed)
Mupirocin antibiotic ointment twice a day on sore areas Cover legs with cerave cream after the antibiotic ointment

## 2017-03-07 NOTE — Progress Notes (Signed)
  Subjective:   Patient ID: Patricia Villa, female    DOB: Jul 09, 1932, 83 y.o.   MRN: [id number] CC: leg pain  HPI: Patricia Villa is a 83 y.o. female presenting for leg pain  Here today with her sister  Legs tingling b/l in the toes at times Falling frequently Has pain in thighs that she has to stop walking which helps relieve the pain  Picking at skin on lower legs, often will bleed Points to several dark scabs, concerned they are moles  Noticed lump in lower back/over sacrum within the past week after a fall No bruising No pain with weight bearing on legs  Relevant past medical, surgical, family and social history reviewed. Allergies and medications reviewed and updated. History  Smoking Status  . Never Smoker  Smokeless Tobacco  . Never Used    Comment: Rare previously   ROS: Per HPI   Objective:    BP 108/60   Pulse 77   Temp 98.9 F (37.2 C) (Oral)   Ht 5\' 4"  (1.626 m)   Wt 126 lb (57.2 kg)   LMP 08/01/1982   BMI 21.63 kg/m   Wt Readings from Last 3 Encounters:  03/07/17 126 lb (57.2 kg)  02/15/17 129 lb (58.5 kg)  01/23/17 135 lb (61.2 kg)    Gen: NAD, alert, elderly, frail, oxygen via nasal canula in place, cooperative with exam EYES: EOMI, no conjunctival injection, or no icterus ENT: OP without erythema LYMPH: no cervical LAD CV: NRRR, normal S1/S2, no murmur, distal pulses diminished, cap refill <3sec Resp: CTABL, no wheezes, normal WOB Abd: +BS, soft, NTND. apprx 3cm wide easily reducible ventral hernia. no guarding or organomegaly Ext: No edema, varicose veins present lower legs Neuro: Alert and oriented MSK: general soft tissue swelling over sacrum, no bruising, no point tenderness over spine Skin: a few excoriations present over b/l LE, scabbed over, no drainage  Assessment & Plan:  Diagnoses and all orders for this visit:  Claudication [facility]) On warfarin, recently supratherapeutic has recheck scheduled for next week -     Ambulatory  referral to Vascular Surgery  Frequent falls Will refer for [facility] PT -     Ambulatory referral to [facility] -     Face-to-face encounter (required for Medicare/Medicaid patients)  Sacral back pain No point tenderness, no bruising, does have some swelli -     DG Sacrum/Coccyx; Future -     Ambulatory referral to [facility] -     Face-to-face encounter (required for Medicare/Medicaid patients)  Small leg sores Do not scratch Use mupirocin ointment  Follow up plan: Return if symptoms worsen or fail to improve. [name], MD [place]

## 2017-03-10 DIAGNOSIS — M545 Low back pain: Secondary | ICD-10-CM | POA: Diagnosis not present

## 2017-03-10 DIAGNOSIS — M25551 Pain in right hip: Secondary | ICD-10-CM | POA: Diagnosis not present

## 2017-03-11 ENCOUNTER — Encounter: Payer: Self-pay | Admitting: *Deleted

## 2017-03-13 ENCOUNTER — Other Ambulatory Visit: Payer: Self-pay | Admitting: *Deleted

## 2017-03-13 ENCOUNTER — Ambulatory Visit (INDEPENDENT_AMBULATORY_CARE_PROVIDER_SITE_OTHER): Payer: Medicare Other | Admitting: *Deleted

## 2017-03-13 ENCOUNTER — Encounter: Payer: Medicare Other | Admitting: *Deleted

## 2017-03-13 DIAGNOSIS — I4819 Other persistent atrial fibrillation: Secondary | ICD-10-CM

## 2017-03-13 DIAGNOSIS — I481 Persistent atrial fibrillation: Secondary | ICD-10-CM | POA: Diagnosis not present

## 2017-03-13 DIAGNOSIS — Z23 Encounter for immunization: Secondary | ICD-10-CM

## 2017-03-14 DIAGNOSIS — M545 Low back pain: Secondary | ICD-10-CM | POA: Diagnosis not present

## 2017-03-14 DIAGNOSIS — M25551 Pain in right hip: Secondary | ICD-10-CM | POA: Diagnosis not present

## 2017-03-14 LAB — PROTIME-INR
INR: 2.2 — AB (ref 0.8–1.2)
PROTHROMBIN TIME: 22.2 s — AB (ref 9.1–12.0)

## 2017-03-17 NOTE — Progress Notes (Signed)
Subjective:     Indication: atrial fibrillation Bleeding signs/symptoms: None Thromboembolic signs/symptoms: None  Missed Coumadin doses: None Medication changes: no Dietary changes: no Bacterial/viral infection: no Other concerns: no  The following portions of the patient's history were reviewed and updated as appropriate: allergies and current medications.  Review of Systems Pertinent items are noted in HPI.   Objective:    INR Today: 2.2 Current dose: 2.5mg  daily    Assessment:    Therapeutic INR for goal of 2-3   Plan:    1. New dose: no change   2. Next INR: 1 month    Protime had to be sent out because our test strips were recalled.  Called and spoke with son, Doren Custard, and relayed normal results.  Appt scheduled for next month.   Mary-Margaret Hassell Done, FNP

## 2017-03-18 ENCOUNTER — Other Ambulatory Visit: Payer: Self-pay | Admitting: Family Medicine

## 2017-03-18 DIAGNOSIS — M545 Low back pain: Secondary | ICD-10-CM | POA: Diagnosis not present

## 2017-03-18 DIAGNOSIS — M25551 Pain in right hip: Secondary | ICD-10-CM | POA: Diagnosis not present

## 2017-03-20 DIAGNOSIS — M545 Low back pain: Secondary | ICD-10-CM | POA: Diagnosis not present

## 2017-03-20 DIAGNOSIS — M25551 Pain in right hip: Secondary | ICD-10-CM | POA: Diagnosis not present

## 2017-03-24 ENCOUNTER — Other Ambulatory Visit: Payer: Self-pay

## 2017-03-24 ENCOUNTER — Ambulatory Visit (HOSPITAL_COMMUNITY): Payer: Medicare Other | Attending: Cardiology

## 2017-03-24 DIAGNOSIS — I083 Combined rheumatic disorders of mitral, aortic and tricuspid valves: Secondary | ICD-10-CM | POA: Insufficient documentation

## 2017-03-24 DIAGNOSIS — I420 Dilated cardiomyopathy: Secondary | ICD-10-CM | POA: Insufficient documentation

## 2017-03-24 DIAGNOSIS — J90 Pleural effusion, not elsewhere classified: Secondary | ICD-10-CM | POA: Insufficient documentation

## 2017-03-24 DIAGNOSIS — I059 Rheumatic mitral valve disease, unspecified: Secondary | ICD-10-CM

## 2017-03-24 DIAGNOSIS — I080 Rheumatic disorders of both mitral and aortic valves: Secondary | ICD-10-CM

## 2017-03-31 ENCOUNTER — Telehealth: Payer: Self-pay | Admitting: *Deleted

## 2017-03-31 DIAGNOSIS — M25551 Pain in right hip: Secondary | ICD-10-CM | POA: Diagnosis not present

## 2017-03-31 DIAGNOSIS — M545 Low back pain: Secondary | ICD-10-CM | POA: Diagnosis not present

## 2017-03-31 NOTE — Telephone Encounter (Signed)
Pt needs appt to be seen. 

## 2017-03-31 NOTE — Telephone Encounter (Signed)
Patricia Villa PT with [facility] Pt had missed PT last week did not feel up to it Did participate today, had increased pain in leg with swelling, ulcer developing and some leakage. Her oxygen was lower and she had an irregular heart rate. If any changes in physical therapy plans needed, please let him know

## 2017-04-01 NOTE — Telephone Encounter (Signed)
Patricia Villa aware pt will be seeing provider on 04/03/17

## 2017-04-01 NOTE — Telephone Encounter (Signed)
Spoke with son, the absolute quickest he can get her here is Thurs 29th at 10;30. appt made. Will u let home health know

## 2017-04-03 ENCOUNTER — Encounter: Payer: Self-pay | Admitting: Pediatrics

## 2017-04-03 ENCOUNTER — Ambulatory Visit (INDEPENDENT_AMBULATORY_CARE_PROVIDER_SITE_OTHER): Payer: Medicare Other | Admitting: Pediatrics

## 2017-04-03 VITALS — BP 102/60 | HR 72 | Temp 96.8°F | Ht 64.0 in | Wt 131.6 lb

## 2017-04-03 DIAGNOSIS — I481 Persistent atrial fibrillation: Secondary | ICD-10-CM

## 2017-04-03 DIAGNOSIS — S80922A Unspecified superficial injury of left lower leg, initial encounter: Secondary | ICD-10-CM | POA: Diagnosis not present

## 2017-04-03 DIAGNOSIS — M25551 Pain in right hip: Secondary | ICD-10-CM | POA: Diagnosis not present

## 2017-04-03 DIAGNOSIS — L03115 Cellulitis of right lower limb: Secondary | ICD-10-CM

## 2017-04-03 DIAGNOSIS — S80921A Unspecified superficial injury of right lower leg, initial encounter: Secondary | ICD-10-CM | POA: Diagnosis not present

## 2017-04-03 DIAGNOSIS — T148XXA Other injury of unspecified body region, initial encounter: Secondary | ICD-10-CM

## 2017-04-03 DIAGNOSIS — I4819 Other persistent atrial fibrillation: Secondary | ICD-10-CM

## 2017-04-03 DIAGNOSIS — M545 Low back pain: Secondary | ICD-10-CM | POA: Diagnosis not present

## 2017-04-03 LAB — COAGUCHEK XS/INR WAIVED
INR: 2.3 — AB (ref 0.9–1.1)
PROTHROMBIN TIME: 28 s

## 2017-04-03 MED ORDER — CEPHALEXIN 500 MG PO CAPS: 500.0000 mg | ORAL_CAPSULE | Freq: Three times a day (TID) | ORAL | 0 refills | Status: DC

## 2017-04-03 MED ORDER — MUPIROCIN 2 % EX OINT: TOPICAL_OINTMENT | Freq: Two times a day (BID) | CUTANEOUS | 1 refills | Status: DC

## 2017-04-03 NOTE — Progress Notes (Signed)
Subjective:   Patient ID: Patricia Villa, female    DOB: 1932/09/29, 83 y.o.   MRN: [id number] CC: Wound Check (leg)  HPI: Patricia Villa is a 83 y.o. female presenting for Wound Check (leg)  For the past week has had more redness and swelling in both of her lower legs Has several superficial wounds that she has been picking at she says because they have been itching Uses lotion on her legs regularly Says that when she gets nervous she wants to pick at things there is a spot on her back of her neck and several scars across her back from the picking  Has noticed some clear drainage from the wounds on her legs in the last couple of days Does not like to keep her feet elevated When she sleeps often will hang her feet over the edge of the bed Here today with Patricia Villa, says that when she does lay flat with her feet up all night long there is no swelling in the morning  Has been putting mupirocin on the spots on her legs Has been doing physical therapy twice a week at home [place] says not keeping up with physical therapy outside of visits  Taking warfarin regularly for atrial fibrillation  Relevant past medical, surgical, family and social history reviewed. Allergies and medications reviewed and updated. Social History   Tobacco Use  Smoking Status Never Smoker  Smokeless Tobacco Never Used  Tobacco Comment   Rare previously   ROS: Per HPI   Objective:    BP 102/60   Pulse 72   Temp (!) 96.8 F (36 C) (Oral)   Ht 5\' 4"  (1.626 m)   Wt 131 lb 9.6 oz (59.7 kg)   LMP 08/01/1982   BMI 22.59 kg/m   Wt Readings from Last 3 Encounters:  04/03/17 131 lb 9.6 oz (59.7 kg)  03/07/17 126 lb (57.2 kg)  02/15/17 129 lb (58.5 kg)    Gen: NAD, alert, cooperative with exam, NCAT EYES: EOMI, no conjunctival injection, or no icterus ENT: OP without erythema LYMPH: no cervical LAD CV: NRRR, normal S1/S2, no murmur, distal pulses 2+ b/l Resp: CTABL, no wheezes, normal WOB Abd: +BS, soft,  NT, proximal 2-3 inch hernia present above the umbilicus, soft, easily reducible .  Abdomen mildly distended, no guarding or organomegaly Ext: Trace edema bilateral lower legs Neuro: Alert and oriented, strength equal b/l UE and LE, coordination grossly normal Skin: Neck with superficial apprx 45mm ulceration, clean white pink base Bilateral lower legs with scattered superficial ulcerations with clear drainage 2-66mm, some surrounding redness right slightly greater than left. Mild tenderness with palpation red areas  Assessment & Plan:  Patricia Villa was seen today for wound check.  Diagnoses and all orders for this visit:  Cellulitis of right lower extremity Must stop picking/scratching at lower legs Discussed with daughter, okay to wrap legs with gauze Keep mupirocin on them Start Keflex Adjusting warfarin as below Return next week for INR recheck Start home health nursing wound follow up Keep feet propped up -     mupirocin ointment (BACTROBAN) 2 %; Apply topically 2 (two) times daily. -     cephALEXin (KEFLEX) 500 MG capsule; Take 1 capsule (500 mg total) by mouth 3 (three) times daily. -     Ambulatory referral to [facility] -     Face-to-face encounter (required for Medicare/Medicaid patients)  Atrial fibrillation, persistent (HCC) INR at goal 2.3 Given start of  antibiotics, hold Saturdays dose Return in 6 days for recheck -     CoaguChek XS/INR Waived  Excoriation -     mupirocin ointment (BACTROBAN) 2 %; Apply topically 2 (two) times daily.   Follow up plan: 6 days for INR recheck Patricia Found, MD [place]

## 2017-04-04 DIAGNOSIS — M25551 Pain in right hip: Secondary | ICD-10-CM | POA: Diagnosis not present

## 2017-04-04 DIAGNOSIS — M545 Low back pain: Secondary | ICD-10-CM | POA: Diagnosis not present

## 2017-04-07 ENCOUNTER — Telehealth: Payer: Self-pay | Admitting: *Deleted

## 2017-04-07 NOTE — Telephone Encounter (Signed)
Yes, OK to dress legs in Whitney boot, OK to do PT/INR at home

## 2017-04-07 NOTE — Telephone Encounter (Signed)
Left detailed message per Dr Evette Doffing that ok to dress legs in unna boots and also do PT/INRs at home. Advised home health nurse to contact office with any further questions

## 2017-04-07 NOTE — Telephone Encounter (Signed)
Joaquin Courts Wanting verbal ok to dress legs with una boots as well as do PT/INRs at home Please call Inez Catalina

## 2017-04-08 DIAGNOSIS — M545 Low back pain: Secondary | ICD-10-CM | POA: Diagnosis not present

## 2017-04-08 DIAGNOSIS — M25551 Pain in right hip: Secondary | ICD-10-CM | POA: Diagnosis not present

## 2017-04-09 ENCOUNTER — Ambulatory Visit (INDEPENDENT_AMBULATORY_CARE_PROVIDER_SITE_OTHER): Payer: Medicare Other | Admitting: *Deleted

## 2017-04-09 ENCOUNTER — Encounter: Payer: Medicare Other | Admitting: *Deleted

## 2017-04-09 DIAGNOSIS — I481 Persistent atrial fibrillation: Secondary | ICD-10-CM

## 2017-04-09 DIAGNOSIS — I4819 Other persistent atrial fibrillation: Secondary | ICD-10-CM

## 2017-04-09 LAB — POCT INR: INR: 1.9 — AB (ref 0.9–1.1)

## 2017-04-09 NOTE — Progress Notes (Signed)
Subjective:     Indication: atrial fibrillation Bleeding signs/symptoms: None Thromboembolic signs/symptoms: None  Missed Coumadin doses: None Medication changes: yes - Keflex started 04/03/17 Dietary changes: no Bacterial/viral infection: cellulitis right lower leg Other concerns: no  The following portions of the patient's history were reviewed and updated as appropriate: allergies and current medications.  Review of Systems Pertinent items are noted in HPI.   Objective:    INR Today: 1.9  Current dose: 2.5mg  daily  Assessment:    Subtherapeutic INR for goal of 2-3   Plan:    1. New dose: no change   2. Next INR: 1 week    Chong Sicilian, RN

## 2017-04-09 NOTE — Progress Notes (Signed)
VM rcvd INR 1.9 Pt taking coumadin 2.5 mg daily

## 2017-04-10 ENCOUNTER — Ambulatory Visit (INDEPENDENT_AMBULATORY_CARE_PROVIDER_SITE_OTHER): Payer: Medicare Other | Admitting: Neurology

## 2017-04-10 ENCOUNTER — Encounter: Payer: Self-pay | Admitting: Neurology

## 2017-04-10 VITALS — BP 116/74 | HR 74 | Wt 140.0 lb

## 2017-04-10 DIAGNOSIS — F0280 Dementia in other diseases classified elsewhere without behavioral disturbance: Secondary | ICD-10-CM | POA: Diagnosis not present

## 2017-04-10 MED ORDER — MEMANTINE HCL 28 X 5 MG & 21 X 10 MG PO TABS: ORAL_TABLET | ORAL | 12 refills | Status: DC

## 2017-04-10 NOTE — Patient Instructions (Addendum)
I had a long discussion the patient and her son regarding her dementia which appears to be progressive. I recommend a trial of Namenda start 5 mg daily for a week then twice daily for a week then 5 mg the morning and 10 at night for week and then 10 mg twice daily as tolerated. Continue Aricept and the current dose of 10 mg daily. Continue Coumadin for stroke prevention due to atrial fibrillation. Check CT scan of the head to rule out any interval strokes or blood clots. She will return for follow-up in 3 months with my nurse practitioner or call earlier if necessary.

## 2017-04-10 NOTE — Progress Notes (Signed)
[facility] [address] [place] [address] [phone]       OFFICE  FOLLOW UP VISIT  NOTE  Patricia Villa Date of Birth:  Aug 13, 1932 Medical Record Number:  [id number]   Referring MD:   Redge Gainer Reason for Referral:   falls  IRJ:JOACZYS Consult 12/20/15 :  Patricia Villa is a 79 year pleasant Caucasian lady who is accompanied by her son today. She has had 3 falls over the last 6 weeks. She states that the fall mostly occur when she is getting up and looking up or leaning backwards. All the fall she is fallen backwards. She has never lost consciousness. She feels off balance. She denies nausea, vomiting vertigo loss of vision or blurred vision. She had is MRI scan of the brain on 5/25 for 17 which are personally reviewed shows left cerebellar pontine angle meningioma which is stable compared with prior scans and there is a small enhancing area of the left occipital cortex which may represent possible subacute infarct. The patient complains of long-standing tinnitus and slight decreased hearing on the left for more than 10 years but denies that this has progressed recently. She also has long-standing history of memory loss and word finding difficulties last several years. She states she was tried on Aricept but did not tolerate it. She has not tried medications like Namenda but is not keen to try new medicines. She also has chronic diplopia for which she has seen Dr. Thornton Park [facility] [place] and prescribed prism glasses which seem to help. She passes a cardiac surgeon October and cardiac valvuloplasty soon. Patient denies any tingling numbness and burning in her pain. She denies any neck pain and radicular pain. She has no history of falls with head injury a significant loss of consciousness. She has no known prior history of strokes TIAs seizures. She denies peripheral vision loss on the right of bumping into objects. She has history of atrial  fibrillation and is on long-term anticoagulation with warfarin. Update 02/29/2016 : She returns for follow-up after last visit 2 months ago. She is accompanied by husband. Patient continues to have memory difficulties and unchanged. She has not thought about trying Aricept and is willing to do so. She states she may have tried it a few years ago but her husband is not sure about this. She had lab work done for reversible causes of memory loss including vitamin B12 and TSH which were normal. CT angiogram of the brain and neck showed no significant intra-or extracranial posterior circulation stenosis. She had mitral valve annuloplasty in [place] by Dr Bridgette Habermann.. She is on warfarin and his getting easy bruisability but no bleeding episodes. Her blood pressure is well controlled and today it is 115/67. She continues to have a hoarse voice due to vocal cord palsy. She did see Dr. Ellene Route for meningioma who recommended conservative management and no treatment. Update 05/09/2016 : She returns for follow-up after last visit 3 months ago. Her husband states that patient is tolerating Aricept 10 mg but it does make her sleepy and the patient feels that this is because of Aricept which she takes at night. She would like to try to switch it during the daytime. Her memory loss and confusion seemed about unchanged. She's not had any significant decline in her activities of daily living and cognitive issues either. On the Mini-Mental Status Examination she scored 22/30 today compared to 23/30 at last visit. Patient has not had any issues with  delusions, hallucinations, agitation or unsafe behavior. He is able to walk reasonably well and has had no falls or injuries. Update 04/10/2017 ; she returns for follow-up after last visit nearly a year ago. She is accompanied by her son who provides most of the history. Patient hasn't had cognitive decline since her last visit. Her short-term memory is quite poor and she gets disoriented and  confused quite easily. She is also delusional at times talking to herself and hallucinating and seeing things which are not there. She continues to walk with a walker but does have occasional falls fortunately no major injury. She is also quite sleepy during the day and does not sleep well at night. She can go to the restroom myself but needs supervision and help with the palpating. She is now on home oxygen but uses intermittently on the vent she's traveling. On the Mini-Mental status exam score has declined to 19/30 from 22/30 at last visit. She remains I septum milligrams daily. She has not tried Namenda in the past. She is tolerating Coumadin without bleeding bruising and INR has been quite stable. ROS:   14 system review of systems is positive for   leg swelling, fatigue, shortness of breath, cough, easy bruising and bleeding, joint pain, aching muscles, memory loss, confusion, weakness, depression, anxiety, change in appetite, hallucinations, insomnia and all other systems negative and all other systems negative  PMH:  Past Medical History:  Diagnosis Date  . Allergy   . Alopecia   . Anxiety   . Arthritis   . Atrial fibrillation, persistent ([place]) 10/30/2015  . Cataract   . DDD (degenerative disc disease), cervical   . Diverticulosis of colon   . Esotropia   . History of double vision   . Hyperlipidemia   . Hypertension   . Hypertrophic cardiomyopathy ([place])    by history but not identified on the most recent echo  . Meningioma (HCC)    cerebellopontine angle   . Mild mitral valve prolapse   . Mitral insufficiency    Severe with anterior flail leaflet.    . Osteoporosis   . Postmenopausal HRT (hormone replacement therapy)   . Pulmonary hypertension ([place])   . 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 Vertebrobasilar insufficiency   . Vocal cord paralysis, unilateral complete     Social History:  Social History   Socioeconomic History  .  Marital status: Married    Spouse name: Not on file  . Number of children: Not on file  . Years of education: Not on file  . Highest education level: Not on file  Social Needs  . Financial resource strain: Not on file  . Food insecurity - worry: Not on file  . Food insecurity - inability: Not on file  . Transportation needs - medical: Not on file  . Transportation needs - non-medical: Not on file  Occupational History  . Not on file  Tobacco Use  . Smoking status: Never Smoker  . Smokeless tobacco: Never Used  . Tobacco comment: Rare previously  Substance and Sexual Activity  . Alcohol use: No    Alcohol/week: 0.0 oz  . Drug use: No  . Sexual activity: No  Other Topics Concern  . Not on file  Social History Narrative  . Not on file    Medications:   Current Outpatient Medications on File Prior to Visit  Medication Sig Dispense Refill  . cephALEXin (KEFLEX)  500 MG capsule Take 1 capsule (500 mg total) by mouth 3 (three) times daily. 21 capsule 0  . Cholecalciferol (VITAMIN D3) 2000 UNITS TABS Take 2,000 Units by mouth daily.    Marland Kitchen donepezil (ARICEPT) 10 MG tablet Take 1 tablet (10 mg total) by mouth at bedtime. (Patient taking differently: Take 10 mg by mouth daily. ) 90 tablet 3  . DULoxetine (CYMBALTA) 30 MG capsule TAKE 1 CAPSULE BY MOUTH ONCE DAILY 90 capsule 0  . Elastic Bandages & Supports (V-2 HIGH COMPRESSION HOSE) MISC Wear daily 1 each 0  . FeFum-FePoly-FA-B Cmp-C-Biot (INTEGRA PLUS) CAPS Take 1 capsule by mouth daily. 90 capsule 3  . folic acid (FOLVITE) 1 MG tablet Take 1 mg by mouth daily.    . furosemide (LASIX) 40 MG tablet TAKE 1 TABLET BY MOUTH TWICE DAILY AS DIRECTED 180 tablet 1  . metolazone (ZAROXOLYN) 2.5 MG tablet Take 1 tablet (2.5 mg total) by mouth daily. Take one tablet 10 tablet 0  . metoprolol succinate (TOPROL-XL) 25 MG 24 hr tablet TAKE ONE-HALF TABLET BY MOUTH ONCE DAILY 135 tablet 0  . mupirocin ointment (BACTROBAN) 2 % Apply topically 2 (two)  times daily. 30 g 1  . Triamcinolone Acetonide (TRIAMCINOLONE 0.1 % CREAM : EUCERIN) CREA Apply 1 application topically 2 (two) times daily. 480 each 0  . warfarin (COUMADIN) 5 MG tablet TAKE 1 TABLET BY MOUTH ONCE DAILY 90 tablet 0   No current facility-administered medications on file prior to visit.     Allergies:   Allergies  Allergen Reactions  . Diphenhydramine Hcl Hives  . Livalo [Pitavastatin Calcium] Other (See Comments)    Pt doesn't remember reaction  . Sulfa Antibiotics Other (See Comments)    Pt doesn't remember reaction  . Zocor [Simvastatin] Other (See Comments)    Pt doesn't remember reaction Leg cramping   . Actonel [Risedronate Sodium] Other (See Comments)    Esophagitis Esophagitis   . Fosamax [Alendronate Sodium] Other (See Comments)    Esophagitis  Esophagitis   . Pravastatin Other (See Comments)    Extreme leg cramping. Extreme leg cramping.    Physical Exam General: Frail elderly Caucasian lady seated, in mild respiratory distress on home oxygen. Sitting in wheelchair. Head: head normocephalic and atraumatic.   Neck: supple with no carotid or supraclavicular bruits Cardiovascular: regular rate and rhythm, no murmurs Musculoskeletal: no deformity Skin:  no rash/petichiae Vascular:  Normal pulses all extremities  Neurologic Exam Mental Status: Awake and fully alert. Oriented to place and time. Recent and remote memory Diminished Attention span, concentration and fund of knowledge diminished. Poor recall 1/3. Animal naming diminished 5 only.. Mood and affect appropriate. Mini-Mental status exam scored 19/30 ( last visit  22/30 ) with deficits in orientation, recall, calculation following three-step commands. Able to name 5 animals with 4 legs. Clock drawing 2/4. Cranial Nerves: Fundoscopic exam not done. Pupils equal, briskly reactive to light. Extraocular movements full without nystagmus. Visual fields full to confrontation. Hearing intact. Facial  sensation intact. Face, tongue, palate moves normally and symmetrically.  Motor: Normal bulk and tone. Normal strength in all tested extremity muscles. Sensory.: intact to touch , pinprick , position and vibratory sensation.  Coordination: Rapid alternating movements normal in all extremities. Finger-to-nose and heel-to-shin performed accurately bilaterally. Gait and Station: Deferred as patient intubated and did not bring her walker.  Reflexes: 1+ and symmetric. Toes downgoing.       ASSESSMENT: 31 year Caucasian lady with history of  falls  likely multifactorial due to combination of known cerebellopontine angle tumor, vertebrobasilar insufficiency,silent left occipital infarct, cerebrovascular disease and mild dementia. which has now progressed    PLAN: I had a long discussion the patient and her son regarding her dementia which appears to be progressive. I recommend a trial of Namenda start 5 mg daily for a week then twice daily for a week then 5 mg the morning and 10 at night for week and then 10 mg twice daily as tolerated. Continue Aricept and the current dose of 10 mg daily. Continue Coumadin for stroke prevention due to atrial fibrillation. Check CT scan of the head to rule out any interval strokes or blood clots. She will return for follow-up in 3 months with my nurse practitioner or call earlier if necessary. Greater than 50% time during this 25 minute visit was spent on counseling and coordination of care about her progressive dementia and memory loss She will return for follow-up in 6 months or call earlier if necessary. Antony Contras, MD  [facility] [address] [place],  [address]  Phone [phone] Fax [phone] Note: This document was prepared with digital dictation and possible smart phrase technology. Any transcriptional errors that result from this process are unintentional.

## 2017-04-11 DIAGNOSIS — M25551 Pain in right hip: Secondary | ICD-10-CM | POA: Diagnosis not present

## 2017-04-11 DIAGNOSIS — M545 Low back pain: Secondary | ICD-10-CM | POA: Diagnosis not present

## 2017-04-16 ENCOUNTER — Ambulatory Visit: Payer: Self-pay | Admitting: *Deleted

## 2017-04-16 DIAGNOSIS — M545 Low back pain: Secondary | ICD-10-CM | POA: Diagnosis not present

## 2017-04-16 DIAGNOSIS — I4819 Other persistent atrial fibrillation: Secondary | ICD-10-CM

## 2017-04-16 DIAGNOSIS — M25551 Pain in right hip: Secondary | ICD-10-CM | POA: Diagnosis not present

## 2017-04-16 LAB — POCT INR: INR: 2.1 — AB (ref 0.9–1.1)

## 2017-04-16 NOTE — Progress Notes (Signed)
VM received w/ INR results INR 2.1 today

## 2017-04-17 ENCOUNTER — Encounter: Payer: Medicare Other | Admitting: Vascular Surgery

## 2017-04-17 ENCOUNTER — Encounter (HOSPITAL_COMMUNITY): Payer: Medicare Other

## 2017-04-17 ENCOUNTER — Telehealth: Payer: Self-pay

## 2017-04-17 DIAGNOSIS — M25551 Pain in right hip: Secondary | ICD-10-CM | POA: Diagnosis not present

## 2017-04-17 DIAGNOSIS — M545 Low back pain: Secondary | ICD-10-CM | POA: Diagnosis not present

## 2017-04-17 NOTE — Telephone Encounter (Signed)
PA done with [facility] at [phone]. Reference number is [id number]. PA submitted was told takes 24 to 72 hours of decision.

## 2017-04-17 NOTE — Telephone Encounter (Signed)
Rn call pts drug store about the namenda starter pack requiring PA. The pharmacy tech stated the generic form is on back order they are not sure when it will be it. They can only order the brand name. Its requiring a PA because brand name is only available.

## 2017-04-18 ENCOUNTER — Encounter: Payer: Self-pay | Admitting: Cardiology

## 2017-04-18 ENCOUNTER — Encounter: Payer: Medicare Other | Admitting: Pharmacist Clinician (PhC)/ Clinical Pharmacy Specialist

## 2017-04-18 DIAGNOSIS — M25551 Pain in right hip: Secondary | ICD-10-CM | POA: Diagnosis not present

## 2017-04-18 DIAGNOSIS — M545 Low back pain: Secondary | ICD-10-CM | POA: Diagnosis not present

## 2017-04-19 ENCOUNTER — Encounter: Payer: Self-pay | Admitting: Family Medicine

## 2017-04-20 ENCOUNTER — Encounter: Payer: Self-pay | Admitting: Cardiology

## 2017-04-20 NOTE — Progress Notes (Signed)
HPI The patient presents for followup of MR and atrial fib.  She had MR with severe a partially flail anterior leaflet with moderate to severe eccentric MR.   There was also moderately severe TR with elevated pulmonary pressures.    She had TEE which confirmed ruptured chords with severe flail motion of the anterior leaflet.  She was seen by Dr. Burt Knack and Dr. Roxy Manns.  She had an extensive discussion to dedice on the best course of therapy for her MV. She was subsequently sent to [facility]. Bridgette Hab[facility] at [facility].   She had mitral valve repair using two mitral clips.  Her last echo last month demonstrated mild MR.  She has a normal EF.  She has severe TR with pulmonary pressure stable and elevated at 60 mm Hg.    Since I last saw her she has become increasingly frail.  She sleeps quite a bit of the time.  She does describe breathlessness but when her family checks her oxygen level is typically above 90%.  She does wear the oxygen as needed.  She has lower extremity swelling and has some skin breakdown but has this dressed.  Home health nurses coming to the house at least temporarily.  Her family is not describing acute decompensation like PND or orthopnea.  She is not having any pain.  She denied having any presyncope or syncope.   Allergies  Allergen Reactions  . Diphenhydramine Hcl Hives  . Livalo [Pitavastatin Calcium] Other (See Comments)    Pt doesn't remember reaction  . Sulfa Antibiotics Other (See Comments)    Pt doesn't remember reaction  . Zocor [Simvastatin] Other (See Comments)    Pt doesn't remember reaction Leg cramping   . Actonel [Risedronate Sodium] Other (See Comments)    Esophagitis Esophagitis   . Fosamax [Alendronate Sodium] Other (See Comments)    Esophagitis  Esophagitis   . Pravastatin Other (See Comments)    Extreme leg cramping. Extreme leg cramping.    Current Outpatient Medications  Medication Sig Dispense Refill  . cephALEXin (KEFLEX) 500 MG capsule Take 1 capsule (500  mg total) by mouth 3 (three) times daily. 21 capsule 0  . Cholecalciferol (VITAMIN D3) 2000 UNITS TABS Take 2,000 Units by mouth daily.    Marland Kitchen donepezil (ARICEPT) 10 MG tablet Take 1 tablet (10 mg total) by mouth at bedtime. (Patient taking differently: Take 10 mg by mouth daily. ) 90 tablet 3  . DULoxetine (CYMBALTA) 30 MG capsule TAKE 1 CAPSULE BY MOUTH ONCE DAILY 90 capsule 0  . Elastic Bandages & Supports (V-2 HIGH COMPRESSION HOSE) MISC Wear daily 1 each 0  . FeFum-FePoly-FA-B Cmp-C-Biot (INTEGRA PLUS) CAPS Take 1 capsule by mouth daily. 90 capsule 3  . folic acid (FOLVITE) 1 MG tablet Take 1 mg by mouth daily.    . furosemide (LASIX) 40 MG tablet TAKE 1 TABLET BY MOUTH TWICE DAILY AS DIRECTED 180 tablet 1  . memantine (NAMENDA TITRATION PAK) tablet pack 5 mg/day for =1 week; 5 mg twice daily for =1 week; 15 mg/day given in 5 mg and 10 mg separated doses for =1 week; then 10 mg twice daily 49 tablet 12  . metolazone (ZAROXOLYN) 2.5 MG tablet Take 1 tablet (2.5 mg total) by mouth daily. Take one tablet 10 tablet 0  . metoprolol succinate (TOPROL-XL) 25 MG 24 hr tablet TAKE ONE-HALF TABLET BY MOUTH ONCE DAILY 135 tablet 0  . mupirocin ointment (BACTROBAN) 2 % Apply topically 2 (two) times daily. [place]  g 1  . Triamcinolone Acetonide (TRIAMCINOLONE 0.1 % CREAM : EUCERIN) CREA Apply 1 application topically 2 (two) times daily. 480 each 0  . warfarin (COUMADIN) 5 MG tablet TAKE 1 TABLET BY MOUTH ONCE DAILY 90 tablet 0   No current facility-administered medications for this visit.     Past Medical History:  Diagnosis Date  . Alopecia   . Anxiety   . Arthritis   . Atrial fibrillation, persistent ([place]) 10/30/2015  . Cataract   . DDD (degenerative disc disease), cervical   . Diverticulosis of colon   . Hyperlipidemia   . Hypertension   . Hypertrophic cardiomyopathy ([place])    by history but not identified on the most recent echo  . Meningioma (HCC)    cerebellopontine angle   . Mitral  insufficiency    Severe with anterior flail leaflet.    . Osteoporosis   . Postmenopausal HRT (hormone replacement therapy)   . Pulmonary hypertension ([place])   . 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 Vertebrobasilar insufficiency   . Vocal cord paralysis, unilateral complete     Past Surgical History:  Procedure Laterality Date  . ABDOMINAL HYSTERECTOMY    . CARDIAC CATHETERIZATION N/A 01/02/2016   Procedure: Right/Left Heart Cath and Coronary Angiography;  Surgeon: Sherren Mocha, MD;  Location: [facility];  Service: Cardiovascular;  Laterality: N/A;  . CATARACT EXTRACTION, BILATERAL    . EYE SURGERY    . TEE WITHOUT CARDIOVERSION N/A 12/12/2015   Procedure: TRANSESOPHAGEAL ECHOCARDIOGRAM (TEE);  Surgeon: Lelon Perla, MD;  Location: [facility];  Service: Cardiovascular;  Laterality: N/A;  . TONSILLECTOMY    . VEIN SURGERY      ROS:      Positive for neuropathy.  Otherwise as stated in the HPI and negative for all other systems.  PHYSICAL EXAM BP 131/70   Pulse 78   Ht 5\' 4"  (1.626 m)   Wt 140 lb (63.5 kg)   LMP 08/01/1982   BMI 24.03 kg/m   GENERAL:  Frail appearing NECK:  No jugular venous distention, waveform within normal limits, carotid upstroke brisk and symmetric, no bruits, no thyromegaly LUNGS:  Clear to auscultation bilaterally BACK: Lordosis and scoliosis CHEST:  Unremarkable HEART:  PMI not displaced or sustained,S1 and S2 within normal limits, no S3, no clicks, no rubs, soft apical systolic murmur slightly in the axilla, no diastolic murmurs, irregular ABD:  Flat, positive bowel sounds normal in frequency in pitch, no bruits, no rebound, no guarding, no midline pulsatile mass, no hepatomegaly, no splenomegaly EXT:  2 plus pulses throughout    Lab Results  Component Value Date   TSH 1.330 08/09/2016   Lab Results  Component Value Date   WBC 5.4 12/27/2016   HGB 11.6 12/27/2016   HCT 37.0  12/27/2016   MCV 99 (H) 12/27/2016   PLT 231 12/27/2016    ASSESSMENT AND PLAN  ATRIAL FIB:   Ms. JOURY ALLCORN has a CHA2DS2 - VASc score of 4 with a risk of stroke of 4%.  She will continue current anticoagulation.    MITRAL INSUFFICIENCY:   This is stable on follow-up echo as described.  No change in therapy is indicated.   HTN:  The blood pressure is at target. No change in medications is indicated. We will continue with therapeutic lifestyle changes (TLC).  HOARSENESS:  [facility] has opted not to do other therapies for this.  This is related to vocal cord paralysis.   PULMONARY HTN:   I reviewed again the notes from Dr.Byrum.   He thought that her PAH was likely secondary to her mitral valve disease and might not respond to vasoactive therapy.  However, it was suggested that we could consider right heart catheterization.  I talked to her son at length about this today.  At this point and probably going forward he does not want further invasive procedures and the patient.  Rather will pursue conservative and comfort measures.  She can take as needed Zaroxolyn if her swelling is exceptional or she is more short of breath.  She will wear her oxygen as needed.  She should avoid salt keep her legs elevated.   CONFUSION: She is due to start Namenda.  I will defer to Dr. Laurance Flatten.

## 2017-04-22 DIAGNOSIS — M545 Low back pain: Secondary | ICD-10-CM | POA: Diagnosis not present

## 2017-04-22 DIAGNOSIS — M25551 Pain in right hip: Secondary | ICD-10-CM | POA: Diagnosis not present

## 2017-04-23 ENCOUNTER — Ambulatory Visit (INDEPENDENT_AMBULATORY_CARE_PROVIDER_SITE_OTHER): Payer: Medicare Other | Admitting: Cardiology

## 2017-04-23 ENCOUNTER — Encounter: Payer: Self-pay | Admitting: Cardiology

## 2017-04-23 VITALS — BP 131/70 | HR 78 | Ht 64.0 in | Wt 140.0 lb

## 2017-04-23 DIAGNOSIS — Z95818 Presence of other cardiac implants and grafts: Secondary | ICD-10-CM

## 2017-04-23 DIAGNOSIS — I482 Chronic atrial fibrillation, unspecified: Secondary | ICD-10-CM

## 2017-04-23 DIAGNOSIS — Z9889 Other specified postprocedural states: Secondary | ICD-10-CM

## 2017-04-23 DIAGNOSIS — I272 Pulmonary hypertension, unspecified: Secondary | ICD-10-CM

## 2017-04-23 DIAGNOSIS — I10 Essential (primary) hypertension: Secondary | ICD-10-CM

## 2017-04-23 NOTE — Patient Instructions (Signed)
Medication Instructions:  The current medical regimen is effective;  continue present plan and medications.  Follow-Up: Follow up in 6 months with Dr. Hochrein.  You will receive a letter in the mail 2 months before you are due.  Please call us when you receive this letter to schedule your follow up appointment.  If you need a refill on your cardiac medications before your next appointment, please call your pharmacy.  Thank you for choosing [facility]!!       

## 2017-04-23 NOTE — Telephone Encounter (Signed)
Rn Social research officer, government at [phone]. [facility]ellen [facility] stated they can supply the pt loose tablets of the namenda starter pack. They stated the generic is on back order, and they dont want pt having to be denied medication. They know it was denied.RN stated the PA was denied. Katie at [employer] will package the medication with loose tablets according to the titration directions by Dr. Leonie Man. It will be package to the weekly dosage with specific directions. The pts son will be call about his and be explain. Rn verbalized understanding.

## 2017-04-23 NOTE — Telephone Encounter (Signed)
Medication was denied because its a brand name. The generic is on back order.

## 2017-04-23 NOTE — Telephone Encounter (Signed)
Rn receive message from [facility] about changing pt to loose tablets because the brand name starter pack is on back order.

## 2017-04-24 DIAGNOSIS — M25551 Pain in right hip: Secondary | ICD-10-CM | POA: Diagnosis not present

## 2017-04-24 DIAGNOSIS — M545 Low back pain: Secondary | ICD-10-CM | POA: Diagnosis not present

## 2017-04-25 DIAGNOSIS — M25551 Pain in right hip: Secondary | ICD-10-CM | POA: Diagnosis not present

## 2017-04-25 DIAGNOSIS — M545 Low back pain: Secondary | ICD-10-CM | POA: Diagnosis not present

## 2017-04-30 DIAGNOSIS — M25551 Pain in right hip: Secondary | ICD-10-CM | POA: Diagnosis not present

## 2017-04-30 DIAGNOSIS — M545 Low back pain: Secondary | ICD-10-CM | POA: Diagnosis not present

## 2017-05-02 DIAGNOSIS — M545 Low back pain: Secondary | ICD-10-CM | POA: Diagnosis not present

## 2017-05-02 DIAGNOSIS — M25551 Pain in right hip: Secondary | ICD-10-CM | POA: Diagnosis not present

## 2017-05-02 LAB — POCT INR: INR: 2.3 — AB (ref 0.9–1.1)

## 2017-05-05 DIAGNOSIS — M25551 Pain in right hip: Secondary | ICD-10-CM | POA: Diagnosis not present

## 2017-05-05 DIAGNOSIS — M545 Low back pain: Secondary | ICD-10-CM | POA: Diagnosis not present

## 2017-05-07 ENCOUNTER — Ambulatory Visit: Payer: Self-pay | Admitting: *Deleted

## 2017-05-07 DIAGNOSIS — I4819 Other persistent atrial fibrillation: Secondary | ICD-10-CM

## 2017-05-07 NOTE — Progress Notes (Signed)
VM Received Test date/time 05/02/17 3:09 INR 2.3

## 2017-05-07 NOTE — Progress Notes (Signed)
INR within normal range at 2.3.  Continue current dose of 2.5mg  daily.  Will repeat at appt with Dr Laurance Flatten on 05/15/17.  Chong Sicilian, RN

## 2017-05-08 ENCOUNTER — Telehealth: Payer: Self-pay | Admitting: *Deleted

## 2017-05-08 DIAGNOSIS — H3530 Unspecified macular degeneration: Secondary | ICD-10-CM | POA: Diagnosis not present

## 2017-05-08 DIAGNOSIS — Z9181 History of falling: Secondary | ICD-10-CM | POA: Diagnosis not present

## 2017-05-08 DIAGNOSIS — M545 Low back pain: Secondary | ICD-10-CM | POA: Diagnosis not present

## 2017-05-08 DIAGNOSIS — S81802D Unspecified open wound, left lower leg, subsequent encounter: Secondary | ICD-10-CM | POA: Diagnosis not present

## 2017-05-08 DIAGNOSIS — Z9981 Dependence on supplemental oxygen: Secondary | ICD-10-CM | POA: Diagnosis not present

## 2017-05-08 DIAGNOSIS — S91302D Unspecified open wound, left foot, subsequent encounter: Secondary | ICD-10-CM | POA: Diagnosis not present

## 2017-05-08 NOTE — Telephone Encounter (Signed)
Spoke with the nurse.  Says that the Namenda separate on top of the counter.  Her mental status was different today compared to the 2 prior times that she has seen her earlier this month, she did think that she was somewhere other than at home.   Called son Patricia Villa, [phone] He said that she has been taking the Namenda, has been on an increasing weekly dose.  He would have to talk with his brother to know what dose she is supposed to be taking right now.  They do a pillbox for her.  He thinks she is also taking the folic acid and the vitamin D regularly.  He says he will go by today to check on her.  Discussed if her mental status is off or if she is having any fever she needs to be seen sooner than her next appointment which right now is scheduled for 05/15/2017.  Called nurse back to relay the above.  Their next visit with her is next week.

## 2017-05-08 NOTE — Telephone Encounter (Signed)
Home Health visit today by Patricia Villa w/ [facility] Pt had low grade temp 99.2 not feeling good, did not sleep last night, out of character for her, cough at times, clear phlegm More confused today, did not know she was at home  Pt is not taking Namenda, Metolazone, folic acid or Vit D Please advise Home Health if these need to be removed from profile or not  Una boots have been discontinued. Left leg is 1 cm x 1 cm scabbed over, other is 0.8 cm x 0.5 cm scabbed over as well, but when she sits depended feet turn black but return to normal when elevated.  Please call Becky back

## 2017-05-09 DIAGNOSIS — H3530 Unspecified macular degeneration: Secondary | ICD-10-CM | POA: Diagnosis not present

## 2017-05-09 DIAGNOSIS — M545 Low back pain: Secondary | ICD-10-CM | POA: Diagnosis not present

## 2017-05-09 DIAGNOSIS — Z9181 History of falling: Secondary | ICD-10-CM | POA: Diagnosis not present

## 2017-05-09 DIAGNOSIS — S91302D Unspecified open wound, left foot, subsequent encounter: Secondary | ICD-10-CM | POA: Diagnosis not present

## 2017-05-09 DIAGNOSIS — S81802D Unspecified open wound, left lower leg, subsequent encounter: Secondary | ICD-10-CM | POA: Diagnosis not present

## 2017-05-09 DIAGNOSIS — Z9981 Dependence on supplemental oxygen: Secondary | ICD-10-CM | POA: Diagnosis not present

## 2017-05-13 DIAGNOSIS — S81802D Unspecified open wound, left lower leg, subsequent encounter: Secondary | ICD-10-CM | POA: Diagnosis not present

## 2017-05-13 DIAGNOSIS — Z9181 History of falling: Secondary | ICD-10-CM | POA: Diagnosis not present

## 2017-05-13 DIAGNOSIS — M545 Low back pain: Secondary | ICD-10-CM | POA: Diagnosis not present

## 2017-05-13 DIAGNOSIS — S91302D Unspecified open wound, left foot, subsequent encounter: Secondary | ICD-10-CM | POA: Diagnosis not present

## 2017-05-13 DIAGNOSIS — Z9981 Dependence on supplemental oxygen: Secondary | ICD-10-CM | POA: Diagnosis not present

## 2017-05-13 DIAGNOSIS — H3530 Unspecified macular degeneration: Secondary | ICD-10-CM | POA: Diagnosis not present

## 2017-05-13 NOTE — Telephone Encounter (Signed)
RN call pts son Arriyanna Mersch to make sure did receive the namenda with changing it to loose tablets.Rn stated the PA was denied because the pharmacy had a back long on the generic titration. PTs son stated he and his brother are managing her medications, and pt is taking it as prescribed. Doren Custard verbalized pt is not having any adverse reactions to the medication, and is doing well. Rn remind son to call if the mom has any issues. PTs son verbalized understanding.

## 2017-05-15 ENCOUNTER — Ambulatory Visit (INDEPENDENT_AMBULATORY_CARE_PROVIDER_SITE_OTHER): Payer: Medicare HMO | Admitting: Family Medicine

## 2017-05-15 ENCOUNTER — Encounter: Payer: Self-pay | Admitting: Family Medicine

## 2017-05-15 VITALS — BP 116/66 | HR 63 | Temp 97.0°F | Ht 64.0 in | Wt 139.0 lb

## 2017-05-15 DIAGNOSIS — E7800 Pure hypercholesterolemia, unspecified: Secondary | ICD-10-CM

## 2017-05-15 DIAGNOSIS — I481 Persistent atrial fibrillation: Secondary | ICD-10-CM

## 2017-05-15 DIAGNOSIS — I421 Obstructive hypertrophic cardiomyopathy: Secondary | ICD-10-CM | POA: Diagnosis not present

## 2017-05-15 DIAGNOSIS — K429 Umbilical hernia without obstruction or gangrene: Secondary | ICD-10-CM

## 2017-05-15 DIAGNOSIS — I739 Peripheral vascular disease, unspecified: Secondary | ICD-10-CM | POA: Diagnosis not present

## 2017-05-15 DIAGNOSIS — E559 Vitamin D deficiency, unspecified: Secondary | ICD-10-CM

## 2017-05-15 DIAGNOSIS — D329 Benign neoplasm of meninges, unspecified: Secondary | ICD-10-CM | POA: Diagnosis not present

## 2017-05-15 DIAGNOSIS — I4819 Other persistent atrial fibrillation: Secondary | ICD-10-CM

## 2017-05-15 DIAGNOSIS — I10 Essential (primary) hypertension: Secondary | ICD-10-CM | POA: Diagnosis not present

## 2017-05-15 DIAGNOSIS — R2681 Unsteadiness on feet: Secondary | ICD-10-CM

## 2017-05-15 NOTE — Progress Notes (Signed)
Subjective:    Patient ID: Patricia Villa, female    DOB: Dec 03, 1932, 84 y.o.   MRN: [id number]  HPI Pt here for follow up and management of chronic medical problems which includes hyperlipidemia, a fib, and hypertension. She is taking medication regularly.  The patient is in fairly good spirits today and comes to the visit today with her son.  Her biggest complaint is her low back pain.  She is also concerned about her need for a pelvic exam and we discussed this and we will actually have her come back and get a pelvic exam with 1 of the mid-level's here in the office.  She denies any chest pain or shortness of breath anymore than usual.  She denies any trouble with nausea vomiting diarrhea blood in the stool change in bowel habits or trouble passing her water.  She has her oxygen.  She still has her voice issues.  She complains of some abdominal discomfort from the umbilical and supra umbilical hernias.  She has seen the surgeon about this and unless she has problems no surgery is recommended.    Patient Active Problem List   Diagnosis Date Noted  . Secondary pulmonary arterial hypertension ([place]) 07/05/2016  . Dysuria 06/12/2016  . History of cystocele 06/12/2016  . Vocal cord paralysis, unilateral complete   . Vertebrobasilar insufficiency   . Stroke due to embolism of left cerebellar artery ([place])   . Aortic atherosclerosis ([place]) 11/20/2015  . Atrial fibrillation, persistent ([place]) 10/30/2015  . Atrial fibrillation ([place]) 11/23/2014  . Osteoporosis 08/31/2014  . Chronic neck pain 06/07/2014  . DDD (degenerative disc disease), cervical 01/24/2014  . Hyperlipidemia 05/17/2013  . Colitis 01/20/2013  . Generalized anxiety disorder 08/05/2012  . Meningioma ([place]) 08/05/2012  . Bruit 10/17/2010  . Essential hypertension, benign 10/04/2009  . MITRAL INSUFFICIENCY 09/26/2009  . Mitral valve disorder 09/26/2009  . Hypertrophic obstructive cardiomyopathy ([place]) 09/26/2009   Outpatient  Encounter Medications as of 05/15/2017  Medication Sig  . Cholecalciferol (VITAMIN D3) 2000 UNITS TABS Take 2,000 Units by mouth daily.  Marland Kitchen donepezil (ARICEPT) 10 MG tablet Take 1 tablet (10 mg total) by mouth at bedtime. (Patient taking differently: Take 10 mg by mouth daily. )  . DULoxetine (CYMBALTA) 30 MG capsule TAKE 1 CAPSULE BY MOUTH ONCE DAILY  . Elastic Bandages & Supports (V-2 HIGH COMPRESSION HOSE) MISC Wear daily  . FeFum-FePoly-FA-B Cmp-C-Biot (INTEGRA PLUS) CAPS Take 1 capsule by mouth daily.  . folic acid (FOLVITE) 1 MG tablet Take 1 mg by mouth daily.  . furosemide (LASIX) 40 MG tablet TAKE 1 TABLET BY MOUTH TWICE DAILY AS DIRECTED  . memantine (NAMENDA TITRATION PAK) tablet pack 5 mg/day for =1 week; 5 mg twice daily for =1 week; 15 mg/day given in 5 mg and 10 mg separated doses for =1 week; then 10 mg twice daily  . metoprolol succinate (TOPROL-XL) 25 MG 24 hr tablet TAKE ONE-HALF TABLET BY MOUTH ONCE DAILY  . mupirocin ointment (BACTROBAN) 2 % Apply topically 2 (two) times daily.  . Triamcinolone Acetonide (TRIAMCINOLONE 0.1 % CREAM : EUCERIN) CREA Apply 1 application topically 2 (two) times daily.  Marland Kitchen warfarin (COUMADIN) 5 MG tablet TAKE 1 TABLET BY MOUTH ONCE DAILY  . [DISCONTINUED] cephALEXin (KEFLEX) 500 MG capsule Take 1 capsule (500 mg total) by mouth 3 (three) times daily.  . [DISCONTINUED] metolazone (ZAROXOLYN) 2.5 MG tablet Take 1 tablet (2.5 mg total) by mouth daily. Take one tablet   No facility-administered encounter medications  on file as of 05/15/2017.       Review of Systems  Constitutional: Negative.   HENT: Negative.   Eyes: Negative.   Respiratory: Negative.   Cardiovascular: Negative.   Gastrointestinal: Negative.   Endocrine: Negative.   Genitourinary: Negative.   Musculoskeletal: Positive for back pain.  Skin: Negative.   Allergic/Immunologic: Negative.   Neurological: Negative.   Hematological: Negative.   Psychiatric/Behavioral: Negative.         Objective:   Physical Exam  Constitutional: She is oriented to person, place, and time. She appears well-developed and well-nourished.  The patient is smiling elderly and pleasant today with no severe particular complaints other than her back pain which she forgot to bring up during the initial meeting.  HENT:  Head: Normocephalic and atraumatic.  Right Ear: External ear normal.  Left Ear: External ear normal.  Nose: Nose normal.  Mouth/Throat: Oropharynx is clear and moist.  Eyes: Conjunctivae and EOM are normal. Pupils are equal, round, and reactive to light. Right eye exhibits no discharge. Left eye exhibits no discharge. No scleral icterus.  Neck: Normal range of motion. Neck supple. No thyromegaly present.  Cardiovascular: Normal rate, regular rhythm and normal heart sounds.  No murmur heard. The heart had a regular rate and rhythm today at 60/min  Pulmonary/Chest: Effort normal and breath sounds normal. No respiratory distress. She has no wheezes. She has no rales.  Clear anteriorly and posteriorly  Abdominal: Soft. Bowel sounds are normal. She exhibits distension. She exhibits no mass. There is no tenderness. There is no rebound and no guarding.  Abdominal bulging and fullness especially at the umbilical and supra umbilical hernia areas.  No significant tenderness.  No masses.  Musculoskeletal: She exhibits no edema or tenderness.  The patient uses a rolling walker for ambulation and there was minimal edema today in either lower extremity.  Lymphadenopathy:    She has no cervical adenopathy.  Neurological: She is alert and oriented to person, place, and time.  Skin: Skin is warm and dry. No rash noted.  The excoriations on her back and neck appear to be healing.  Psychiatric: She has a normal mood and affect. Her behavior is normal. Judgment and thought content normal.  Nursing note and vitals reviewed.  BP 116/66 (BP Location: Left Arm)   Pulse 63   Temp (!) 97 F  (36.1 C) (Oral)   Ht 5' 4"  (1.626 m)   Wt 139 lb (63 kg)   LMP 08/01/1982   BMI 23.86 kg/m         Assessment & Plan:  1. Atrial fibrillation, persistent ([place]) -The patient appeared to be in normal sinus rhythm today.  She will continue to follow-up with cardiology. - CBC with Differential/Platelet  2. Vitamin D deficiency -Continue current treatment pending results of lab work - CBC with Differential/Platelet - VITAMIN D 25 Hydroxy (Vit-D Deficiency, Fractures)  3. Pure hypercholesterolemia -Continue as aggressive therapeutic lifestyle changes she is statin intolerant - CBC with Differential/Platelet - Lipid panel  4. Gait instability -Walk and exercise as much as possible with walker - CBC with Differential/Platelet  5. Essential hypertension, benign -Blood pressure is good today and there will be no change in treatment - BMP8+EGFR - CBC with Differential/Platelet - Hepatic function panel  6. Umbilical hernia without obstruction and without gangrene -We will continue to monitor these hernias and the caregiver understands that if she develops any severe pain in the area of the hernias that she should go to the  emergency room immediately  7. Claudication ([place]) -No complaints with this today  8. Hypertrophic obstructive cardiomyopathy ([place]) -Follow-up with cardiology as planned  9. Meningioma [facility]) -Follow-up with neurosurgery as planned  Patient Instructions                       Medicare Annual Wellness Visit  [facility] [facility] and the medical providers at [facility] strive to bring you the best medical care.  In doing so we not only want to address your current medical conditions and concerns but also to detect new conditions early and prevent illness, disease and health-related problems.    Medicare offers a yearly Wellness Visit which allows our clinical staff to assess your need for preventative services including immunizations, lifestyle  education, counseling to decrease risk of preventable diseases and screening for fall risk and other medical concerns.    This visit is provided free of charge (no copay) for all Medicare recipients. The clinical pharmacists at [facility] have begun to conduct these Wellness Visits which will also include a thorough review of all your medications.    As you primary medical provider recommend that you make an appointment for your Annual Wellness Visit if you have not done so already this year.  You may set up this appointment before you leave today or you may call back ([phone]) and schedule an appointment.  Please make sure when you call that you mention that you are scheduling your Annual Wellness Visit with the clinical pharmacist so that the appointment may be made for the proper length of time.     Continue current medications. Continue good therapeutic lifestyle changes which include good diet and exercise. Fall precautions discussed with patient. If an FOBT was given today- please return it to our front desk. If you are over 24 years old - you may need Prevnar 40 or the adult Pneumonia vaccine.  **Flu shots are available--- please call and schedule a FLU-CLINIC appointment**  After your visit with [place] today you will receive a survey in the mail or online from [employer] regarding your care with [place]. Please take a moment to fill this out. Your feedback is very important to [place] as you can help [place] better understand your patient needs as well as improve your experience and satisfaction. WE CARE ABOUT YOU!!!     Arrie Senate MD

## 2017-05-15 NOTE — Patient Instructions (Signed)
Medicare Annual Wellness Visit  [facility] and the medical providers at [facility] strive to bring you the best medical care.  In doing so we not only want to address your current medical conditions and concerns but also to detect new conditions early and prevent illness, disease and health-related problems.    Medicare offers a yearly Wellness Visit which allows our clinical staff to assess your need for preventative services including immunizations, lifestyle education, counseling to decrease risk of preventable diseases and screening for fall risk and other medical concerns.    This visit is provided free of charge (no copay) for all Medicare recipients. The clinical pharmacists at [facility] have begun to conduct these Wellness Visits which will also include a thorough review of all your medications.    As you primary medical provider recommend that you make an appointment for your Annual Wellness Visit if you have not done so already this year.  You may set up this appointment before you leave today or you may call back ([phone]) and schedule an appointment.  Please make sure when you call that you mention that you are scheduling your Annual Wellness Visit with the clinical pharmacist so that the appointment may be made for the proper length of time.     Continue current medications. Continue good therapeutic lifestyle changes which include good diet and exercise. Fall precautions discussed with patient. If an FOBT was given today- please return it to our front desk. If you are over 50 years old - you may need Prevnar 13 or the adult Pneumonia vaccine.  **Flu shots are available--- please call and schedule a FLU-CLINIC appointment**  After your visit with us today you will receive a survey in the mail or online from [employer] regarding your care with us. Please take a moment to fill this out. Your feedback is very  important to us as you can help us better understand your patient needs as well as improve your experience and satisfaction. WE CARE ABOUT YOU!!!    

## 2017-05-16 DIAGNOSIS — M545 Low back pain: Secondary | ICD-10-CM | POA: Diagnosis not present

## 2017-05-16 DIAGNOSIS — H3530 Unspecified macular degeneration: Secondary | ICD-10-CM | POA: Diagnosis not present

## 2017-05-16 DIAGNOSIS — S91302D Unspecified open wound, left foot, subsequent encounter: Secondary | ICD-10-CM | POA: Diagnosis not present

## 2017-05-16 DIAGNOSIS — Z9981 Dependence on supplemental oxygen: Secondary | ICD-10-CM | POA: Diagnosis not present

## 2017-05-16 DIAGNOSIS — Z9181 History of falling: Secondary | ICD-10-CM | POA: Diagnosis not present

## 2017-05-16 DIAGNOSIS — S81802D Unspecified open wound, left lower leg, subsequent encounter: Secondary | ICD-10-CM | POA: Diagnosis not present

## 2017-05-16 LAB — CBC WITH DIFFERENTIAL/PLATELET
BASOS: 1 %
Basophils Absolute: 0 10*3/uL (ref 0.0–0.2)
EOS (ABSOLUTE): 0.1 10*3/uL (ref 0.0–0.4)
EOS: 2 %
HEMATOCRIT: 38.9 % (ref 34.0–46.6)
Hemoglobin: 12.7 g/dL (ref 11.1–15.9)
IMMATURE GRANS (ABS): 0 10*3/uL (ref 0.0–0.1)
IMMATURE GRANULOCYTES: 0 %
LYMPHS: 21 %
Lymphocytes Absolute: 1.2 10*3/uL (ref 0.7–3.1)
MCH: 33.8 pg — ABNORMAL HIGH (ref 26.6–33.0)
MCHC: 32.6 g/dL (ref 31.5–35.7)
MCV: 104 fL — AB (ref 79–97)
Monocytes Absolute: 0.8 10*3/uL (ref 0.1–0.9)
Monocytes: 13 %
NEUTROS PCT: 63 %
Neutrophils Absolute: 3.7 10*3/uL (ref 1.4–7.0)
PLATELETS: 199 10*3/uL (ref 150–379)
RBC: 3.76 x10E6/uL — AB (ref 3.77–5.28)
RDW: 15.5 % — ABNORMAL HIGH (ref 12.3–15.4)
WBC: 5.8 10*3/uL (ref 3.4–10.8)

## 2017-05-16 LAB — VITAMIN D 25 HYDROXY (VIT D DEFICIENCY, FRACTURES): VIT D 25 HYDROXY: 45.6 ng/mL (ref 30.0–100.0)

## 2017-05-16 LAB — HEPATIC FUNCTION PANEL
ALBUMIN: 3.6 g/dL (ref 3.5–4.7)
ALT: 21 IU/L (ref 0–32)
AST: 40 IU/L (ref 0–40)
Alkaline Phosphatase: 119 IU/L — ABNORMAL HIGH (ref 39–117)
BILIRUBIN TOTAL: 1.9 mg/dL — AB (ref 0.0–1.2)
BILIRUBIN, DIRECT: 1.22 mg/dL — AB (ref 0.00–0.40)
Total Protein: 7.8 g/dL (ref 6.0–8.5)

## 2017-05-16 LAB — LIPID PANEL
Chol/HDL Ratio: 2.4 ratio (ref 0.0–4.4)
Cholesterol, Total: 88 mg/dL — ABNORMAL LOW (ref 100–199)
HDL: 37 mg/dL — AB (ref >39)
LDL Calculated: 40 mg/dL (ref 0–99)
TRIGLYCERIDES: 54 mg/dL (ref 0–149)
VLDL CHOLESTEROL CAL: 11 mg/dL (ref 5–40)

## 2017-05-16 LAB — BMP8+EGFR
BUN/Creatinine Ratio: 27 (ref 12–28)
BUN: 24 mg/dL (ref 8–27)
CO2: 28 mmol/L (ref 20–29)
CREATININE: 0.89 mg/dL (ref 0.57–1.00)
Calcium: 9.3 mg/dL (ref 8.7–10.3)
Chloride: 95 mmol/L — ABNORMAL LOW (ref 96–106)
GFR calc Af Amer: 70 mL/min/{1.73_m2} (ref >59)
GFR, EST NON AFRICAN AMERICAN: 61 mL/min/{1.73_m2} (ref >59)
Glucose: 88 mg/dL (ref 65–99)
Potassium: 4 mmol/L (ref 3.5–5.2)
Sodium: 137 mmol/L (ref 134–144)

## 2017-05-19 DIAGNOSIS — S81802D Unspecified open wound, left lower leg, subsequent encounter: Secondary | ICD-10-CM | POA: Diagnosis not present

## 2017-05-19 DIAGNOSIS — Z9981 Dependence on supplemental oxygen: Secondary | ICD-10-CM | POA: Diagnosis not present

## 2017-05-19 DIAGNOSIS — Z9181 History of falling: Secondary | ICD-10-CM | POA: Diagnosis not present

## 2017-05-19 DIAGNOSIS — M545 Low back pain: Secondary | ICD-10-CM | POA: Diagnosis not present

## 2017-05-19 DIAGNOSIS — S91302D Unspecified open wound, left foot, subsequent encounter: Secondary | ICD-10-CM | POA: Diagnosis not present

## 2017-05-19 DIAGNOSIS — H3530 Unspecified macular degeneration: Secondary | ICD-10-CM | POA: Diagnosis not present

## 2017-05-19 NOTE — Telephone Encounter (Signed)
Rn call [facility] to state they are requesting a new generic namenda rx. The pharmacist tech stated the starter pack was on back order, but the pt was given the med in a different format. They stated pt has a namenda 10mg  twice daily on hold. Rn stated it has been over a month, and pt should be at the max dosage of 10mg  twice daily.Rn remind pharmacy to call the son about pt has rx on hold. Pharmacist tech verbalized understanding.

## 2017-05-20 ENCOUNTER — Telehealth: Payer: Self-pay | Admitting: *Deleted

## 2017-05-20 DIAGNOSIS — Z9181 History of falling: Secondary | ICD-10-CM | POA: Diagnosis not present

## 2017-05-20 DIAGNOSIS — Z9981 Dependence on supplemental oxygen: Secondary | ICD-10-CM | POA: Diagnosis not present

## 2017-05-20 DIAGNOSIS — H3530 Unspecified macular degeneration: Secondary | ICD-10-CM | POA: Diagnosis not present

## 2017-05-20 DIAGNOSIS — M545 Low back pain: Secondary | ICD-10-CM | POA: Diagnosis not present

## 2017-05-20 DIAGNOSIS — S81802D Unspecified open wound, left lower leg, subsequent encounter: Secondary | ICD-10-CM | POA: Diagnosis not present

## 2017-05-20 DIAGNOSIS — S91302D Unspecified open wound, left foot, subsequent encounter: Secondary | ICD-10-CM | POA: Diagnosis not present

## 2017-05-20 NOTE — Telephone Encounter (Signed)
[facility] nurse saw pt she had a fall on Friday c/o right sided flank pain, does not have any swelling or bruising. Nurse will extend her visits 2 more times to assess this fall.

## 2017-05-21 ENCOUNTER — Ambulatory Visit (INDEPENDENT_AMBULATORY_CARE_PROVIDER_SITE_OTHER): Payer: Medicare HMO

## 2017-05-21 DIAGNOSIS — S81802D Unspecified open wound, left lower leg, subsequent encounter: Secondary | ICD-10-CM

## 2017-05-21 DIAGNOSIS — M545 Low back pain: Secondary | ICD-10-CM | POA: Diagnosis not present

## 2017-05-21 DIAGNOSIS — H3530 Unspecified macular degeneration: Secondary | ICD-10-CM | POA: Diagnosis not present

## 2017-05-21 DIAGNOSIS — S91302D Unspecified open wound, left foot, subsequent encounter: Secondary | ICD-10-CM

## 2017-05-23 DIAGNOSIS — S81802D Unspecified open wound, left lower leg, subsequent encounter: Secondary | ICD-10-CM | POA: Diagnosis not present

## 2017-05-23 DIAGNOSIS — H3530 Unspecified macular degeneration: Secondary | ICD-10-CM | POA: Diagnosis not present

## 2017-05-23 DIAGNOSIS — Z9981 Dependence on supplemental oxygen: Secondary | ICD-10-CM | POA: Diagnosis not present

## 2017-05-23 DIAGNOSIS — M545 Low back pain: Secondary | ICD-10-CM | POA: Diagnosis not present

## 2017-05-23 DIAGNOSIS — S91302D Unspecified open wound, left foot, subsequent encounter: Secondary | ICD-10-CM | POA: Diagnosis not present

## 2017-05-23 DIAGNOSIS — Z9181 History of falling: Secondary | ICD-10-CM | POA: Diagnosis not present

## 2017-05-27 ENCOUNTER — Telehealth: Payer: Self-pay | Admitting: *Deleted

## 2017-05-27 DIAGNOSIS — H3530 Unspecified macular degeneration: Secondary | ICD-10-CM | POA: Diagnosis not present

## 2017-05-27 DIAGNOSIS — S81802D Unspecified open wound, left lower leg, subsequent encounter: Secondary | ICD-10-CM | POA: Diagnosis not present

## 2017-05-27 DIAGNOSIS — S91302D Unspecified open wound, left foot, subsequent encounter: Secondary | ICD-10-CM | POA: Diagnosis not present

## 2017-05-27 DIAGNOSIS — Z9981 Dependence on supplemental oxygen: Secondary | ICD-10-CM | POA: Diagnosis not present

## 2017-05-27 DIAGNOSIS — M545 Low back pain: Secondary | ICD-10-CM | POA: Diagnosis not present

## 2017-05-27 DIAGNOSIS — Z9181 History of falling: Secondary | ICD-10-CM | POA: Diagnosis not present

## 2017-05-27 NOTE — Telephone Encounter (Signed)
[facility] nurse saw pt this morning She had swelling of right hand for the past 2-3 days Pt not sure if she hit it on the side rails while she slept Nurse advised elevation and ice packs If any additional advice please contact [facility] nurse at [phone]

## 2017-05-29 ENCOUNTER — Telehealth: Payer: Self-pay | Admitting: Family Medicine

## 2017-05-29 DIAGNOSIS — Z9981 Dependence on supplemental oxygen: Secondary | ICD-10-CM | POA: Diagnosis not present

## 2017-05-29 DIAGNOSIS — H3530 Unspecified macular degeneration: Secondary | ICD-10-CM | POA: Diagnosis not present

## 2017-05-29 DIAGNOSIS — S91302D Unspecified open wound, left foot, subsequent encounter: Secondary | ICD-10-CM | POA: Diagnosis not present

## 2017-05-29 DIAGNOSIS — M545 Low back pain: Secondary | ICD-10-CM | POA: Diagnosis not present

## 2017-05-29 DIAGNOSIS — S81802D Unspecified open wound, left lower leg, subsequent encounter: Secondary | ICD-10-CM | POA: Diagnosis not present

## 2017-05-29 DIAGNOSIS — Z9181 History of falling: Secondary | ICD-10-CM | POA: Diagnosis not present

## 2017-05-29 NOTE — Telephone Encounter (Signed)
I spoke with therapist and her o2 levels dropped down in the 70s while there. I spoke son Doren Custard and advised that she needs to be evaluated by the ER department.

## 2017-05-31 ENCOUNTER — Emergency Department (HOSPITAL_COMMUNITY): Payer: Medicare HMO

## 2017-05-31 ENCOUNTER — Encounter (HOSPITAL_COMMUNITY): Payer: Self-pay

## 2017-05-31 ENCOUNTER — Other Ambulatory Visit: Payer: Self-pay

## 2017-05-31 ENCOUNTER — Inpatient Hospital Stay (HOSPITAL_COMMUNITY)
Admission: EM | Admit: 2017-05-31 | Discharge: 2017-06-05 | DRG: 291 | Disposition: A | Discharge status: Hospice | Payer: Medicare HMO | Attending: Internal Medicine | Admitting: Internal Medicine

## 2017-05-31 DIAGNOSIS — K7460 Unspecified cirrhosis of liver: Secondary | ICD-10-CM | POA: Diagnosis not present

## 2017-05-31 DIAGNOSIS — I4510 Unspecified right bundle-branch block: Secondary | ICD-10-CM | POA: Diagnosis present

## 2017-05-31 DIAGNOSIS — I110 Hypertensive heart disease with heart failure: Secondary | ICD-10-CM | POA: Diagnosis not present

## 2017-05-31 DIAGNOSIS — M810 Age-related osteoporosis without current pathological fracture: Secondary | ICD-10-CM | POA: Diagnosis present

## 2017-05-31 DIAGNOSIS — I2781 Cor pulmonale (chronic): Secondary | ICD-10-CM | POA: Diagnosis present

## 2017-05-31 DIAGNOSIS — M542 Cervicalgia: Secondary | ICD-10-CM | POA: Diagnosis present

## 2017-05-31 DIAGNOSIS — I5082 Biventricular heart failure: Secondary | ICD-10-CM | POA: Diagnosis present

## 2017-05-31 DIAGNOSIS — Z79899 Other long term (current) drug therapy: Secondary | ICD-10-CM

## 2017-05-31 DIAGNOSIS — R0902 Hypoxemia: Secondary | ICD-10-CM

## 2017-05-31 DIAGNOSIS — I2721 Secondary pulmonary arterial hypertension: Secondary | ICD-10-CM | POA: Diagnosis not present

## 2017-05-31 DIAGNOSIS — J9621 Acute and chronic respiratory failure with hypoxia: Secondary | ICD-10-CM | POA: Diagnosis present

## 2017-05-31 DIAGNOSIS — F411 Generalized anxiety disorder: Secondary | ICD-10-CM | POA: Diagnosis present

## 2017-05-31 DIAGNOSIS — Z6822 Body mass index (BMI) 22.0-22.9, adult: Secondary | ICD-10-CM | POA: Diagnosis not present

## 2017-05-31 DIAGNOSIS — F0390 Unspecified dementia without behavioral disturbance: Secondary | ICD-10-CM | POA: Diagnosis present

## 2017-05-31 DIAGNOSIS — I5033 Acute on chronic diastolic (congestive) heart failure: Secondary | ICD-10-CM | POA: Diagnosis not present

## 2017-05-31 DIAGNOSIS — J939 Pneumothorax, unspecified: Secondary | ICD-10-CM

## 2017-05-31 DIAGNOSIS — Z86011 Personal history of benign neoplasm of the brain: Secondary | ICD-10-CM

## 2017-05-31 DIAGNOSIS — I421 Obstructive hypertrophic cardiomyopathy: Secondary | ICD-10-CM

## 2017-05-31 DIAGNOSIS — R0602 Shortness of breath: Secondary | ICD-10-CM

## 2017-05-31 DIAGNOSIS — I4819 Other persistent atrial fibrillation: Secondary | ICD-10-CM | POA: Diagnosis present

## 2017-05-31 DIAGNOSIS — I509 Heart failure, unspecified: Secondary | ICD-10-CM

## 2017-05-31 DIAGNOSIS — Z515 Encounter for palliative care: Secondary | ICD-10-CM

## 2017-05-31 DIAGNOSIS — I081 Rheumatic disorders of both mitral and tricuspid valves: Secondary | ICD-10-CM | POA: Diagnosis present

## 2017-05-31 DIAGNOSIS — I10 Essential (primary) hypertension: Secondary | ICD-10-CM | POA: Diagnosis not present

## 2017-05-31 DIAGNOSIS — J690 Pneumonitis due to inhalation of food and vomit: Secondary | ICD-10-CM | POA: Diagnosis not present

## 2017-05-31 DIAGNOSIS — K761 Chronic passive congestion of liver: Secondary | ICD-10-CM | POA: Diagnosis not present

## 2017-05-31 DIAGNOSIS — I272 Pulmonary hypertension, unspecified: Secondary | ICD-10-CM | POA: Diagnosis present

## 2017-05-31 DIAGNOSIS — Z882 Allergy status to sulfonamides status: Secondary | ICD-10-CM

## 2017-05-31 DIAGNOSIS — R627 Adult failure to thrive: Secondary | ICD-10-CM | POA: Diagnosis present

## 2017-05-31 DIAGNOSIS — M7989 Other specified soft tissue disorders: Secondary | ICD-10-CM | POA: Diagnosis not present

## 2017-05-31 DIAGNOSIS — I5043 Acute on chronic combined systolic (congestive) and diastolic (congestive) heart failure: Secondary | ICD-10-CM | POA: Diagnosis present

## 2017-05-31 DIAGNOSIS — Z9889 Other specified postprocedural states: Secondary | ICD-10-CM

## 2017-05-31 DIAGNOSIS — I481 Persistent atrial fibrillation: Secondary | ICD-10-CM | POA: Diagnosis not present

## 2017-05-31 DIAGNOSIS — Z7189 Other specified counseling: Secondary | ICD-10-CM

## 2017-05-31 DIAGNOSIS — G8929 Other chronic pain: Secondary | ICD-10-CM | POA: Diagnosis present

## 2017-05-31 DIAGNOSIS — I248 Other forms of acute ischemic heart disease: Secondary | ICD-10-CM | POA: Diagnosis present

## 2017-05-31 DIAGNOSIS — Z952 Presence of prosthetic heart valve: Secondary | ICD-10-CM

## 2017-05-31 DIAGNOSIS — R531 Weakness: Secondary | ICD-10-CM | POA: Diagnosis not present

## 2017-05-31 DIAGNOSIS — F329 Major depressive disorder, single episode, unspecified: Secondary | ICD-10-CM | POA: Diagnosis present

## 2017-05-31 DIAGNOSIS — Z7901 Long term (current) use of anticoagulants: Secondary | ICD-10-CM

## 2017-05-31 DIAGNOSIS — R404 Transient alteration of awareness: Secondary | ICD-10-CM | POA: Diagnosis not present

## 2017-05-31 DIAGNOSIS — R846 Abnormal cytological findings in specimens from respiratory organs and thorax: Secondary | ICD-10-CM | POA: Diagnosis not present

## 2017-05-31 DIAGNOSIS — R229 Localized swelling, mass and lump, unspecified: Secondary | ICD-10-CM | POA: Diagnosis not present

## 2017-05-31 DIAGNOSIS — J9601 Acute respiratory failure with hypoxia: Secondary | ICD-10-CM | POA: Diagnosis not present

## 2017-05-31 DIAGNOSIS — R1310 Dysphagia, unspecified: Secondary | ICD-10-CM | POA: Diagnosis present

## 2017-05-31 DIAGNOSIS — Z66 Do not resuscitate: Secondary | ICD-10-CM | POA: Diagnosis present

## 2017-05-31 DIAGNOSIS — Z888 Allergy status to other drugs, medicaments and biological substances status: Secondary | ICD-10-CM

## 2017-05-31 DIAGNOSIS — I071 Rheumatic tricuspid insufficiency: Secondary | ICD-10-CM | POA: Diagnosis present

## 2017-05-31 DIAGNOSIS — I059 Rheumatic mitral valve disease, unspecified: Secondary | ICD-10-CM | POA: Diagnosis present

## 2017-05-31 DIAGNOSIS — I4581 Long QT syndrome: Secondary | ICD-10-CM | POA: Diagnosis present

## 2017-05-31 DIAGNOSIS — J90 Pleural effusion, not elsewhere classified: Secondary | ICD-10-CM | POA: Diagnosis not present

## 2017-05-31 DIAGNOSIS — R188 Other ascites: Secondary | ICD-10-CM | POA: Diagnosis present

## 2017-05-31 DIAGNOSIS — Z9981 Dependence on supplemental oxygen: Secondary | ICD-10-CM

## 2017-05-31 DIAGNOSIS — E785 Hyperlipidemia, unspecified: Secondary | ICD-10-CM | POA: Diagnosis present

## 2017-05-31 LAB — CBC WITH DIFFERENTIAL/PLATELET
BASOS ABS: 0 10*3/uL (ref 0.0–0.1)
BASOS PCT: 0 %
Eosinophils Absolute: 0.1 10*3/uL (ref 0.0–0.7)
Eosinophils Relative: 1 %
HEMATOCRIT: 40.6 % (ref 36.0–46.0)
HEMOGLOBIN: 12.9 g/dL (ref 12.0–15.0)
Lymphocytes Relative: 13 %
Lymphs Abs: 1.1 10*3/uL (ref 0.7–4.0)
MCH: 34.2 pg — ABNORMAL HIGH (ref 26.0–34.0)
MCHC: 31.8 g/dL (ref 30.0–36.0)
MCV: 107.7 fL — ABNORMAL HIGH (ref 78.0–100.0)
Monocytes Absolute: 1.1 10*3/uL — ABNORMAL HIGH (ref 0.1–1.0)
Monocytes Relative: 14 %
NEUTROS ABS: 6 10*3/uL (ref 1.7–7.7)
NEUTROS PCT: 72 %
Platelets: 183 10*3/uL (ref 150–400)
RBC: 3.77 MIL/uL — AB (ref 3.87–5.11)
RDW: 15.8 % — ABNORMAL HIGH (ref 11.5–15.5)
WBC: 8.2 10*3/uL (ref 4.0–10.5)

## 2017-05-31 LAB — COMPREHENSIVE METABOLIC PANEL
ALK PHOS: 96 U/L (ref 38–126)
ALT: 22 U/L (ref 14–54)
ANION GAP: 13 (ref 5–15)
AST: 45 U/L — ABNORMAL HIGH (ref 15–41)
Albumin: 2.9 g/dL — ABNORMAL LOW (ref 3.5–5.0)
BILIRUBIN TOTAL: 3.4 mg/dL — AB (ref 0.3–1.2)
BUN: 26 mg/dL — ABNORMAL HIGH (ref 6–20)
CALCIUM: 8.8 mg/dL — AB (ref 8.9–10.3)
CO2: 26 mmol/L (ref 22–32)
Chloride: 100 mmol/L — ABNORMAL LOW (ref 101–111)
Creatinine, Ser: 0.76 mg/dL (ref 0.44–1.00)
GFR calc non Af Amer: >60 mL/min (ref >60)
Glucose, Bld: 91 mg/dL (ref 65–99)
Potassium: 3.8 mmol/L (ref 3.5–5.1)
Sodium: 139 mmol/L (ref 135–145)
TOTAL PROTEIN: 7.2 g/dL (ref 6.5–8.1)

## 2017-05-31 LAB — TROPONIN I
TROPONIN I: 0.08 ng/mL — AB (ref <0.03)
Troponin I: 0.09 ng/mL (ref <0.03)

## 2017-05-31 LAB — URIC ACID: URIC ACID, SERUM: 6.5 mg/dL (ref 2.3–6.6)

## 2017-05-31 LAB — BRAIN NATRIURETIC PEPTIDE: B Natriuretic Peptide: 1714 pg/mL — ABNORMAL HIGH (ref 0.0–100.0)

## 2017-05-31 MED ORDER — MEMANTINE HCL 10 MG PO TABS
10.0000 mg | ORAL_TABLET | Freq: Two times a day (BID) | ORAL | Status: DC
  Administered 2017-05-31 – 2017-06-03 (×6): 10 mg via ORAL
  Filled 2017-05-31 (×6): qty 1

## 2017-05-31 MED ORDER — SODIUM CHLORIDE 0.9% FLUSH
3.0000 mL | INTRAVENOUS | Status: DC | PRN
  Administered 2017-06-01: 3 mL via INTRAVENOUS
  Filled 2017-05-31: qty 3

## 2017-05-31 MED ORDER — POLYSACCHARIDE IRON COMPLEX 150 MG PO CAPS
150.0000 mg | ORAL_CAPSULE | Freq: Every day | ORAL | Status: DC
  Administered 2017-05-31 – 2017-06-03 (×3): 150 mg via ORAL
  Filled 2017-05-31 (×3): qty 1

## 2017-05-31 MED ORDER — WARFARIN SODIUM 2.5 MG PO TABS
2.5000 mg | ORAL_TABLET | Freq: Every day | ORAL | Status: DC
  Administered 2017-05-31: 2.5 mg via ORAL
  Filled 2017-05-31: qty 1

## 2017-05-31 MED ORDER — FUROSEMIDE 10 MG/ML IJ SOLN
40.0000 mg | Freq: Two times a day (BID) | INTRAMUSCULAR | Status: DC
  Administered 2017-05-31 – 2017-06-02 (×5): 40 mg via INTRAVENOUS
  Filled 2017-05-31 (×5): qty 4

## 2017-05-31 MED ORDER — SODIUM CHLORIDE 0.9 % IV SOLN
INTRAVENOUS | Status: DC
Start: 2017-05-31 — End: 2017-05-31
  Administered 2017-05-31: 14:00:00 via INTRAVENOUS

## 2017-05-31 MED ORDER — FUROSEMIDE 10 MG/ML IJ SOLN
40.0000 mg | Freq: Once | INTRAMUSCULAR | Status: AC
  Administered 2017-05-31: 40 mg via INTRAVENOUS
  Filled 2017-05-31: qty 4

## 2017-05-31 MED ORDER — DULOXETINE HCL 30 MG PO CPEP
30.0000 mg | ORAL_CAPSULE | Freq: Every day | ORAL | Status: DC
  Administered 2017-05-31 – 2017-06-03 (×3): 30 mg via ORAL
  Filled 2017-05-31 (×3): qty 1

## 2017-05-31 MED ORDER — VITAMIN D3 25 MCG (1000 UNIT) PO TABS
2000.0000 [IU] | ORAL_TABLET | Freq: Every day | ORAL | Status: DC
  Administered 2017-05-31 – 2017-06-03 (×3): 2000 [IU] via ORAL
  Filled 2017-05-31 (×10): qty 2

## 2017-05-31 MED ORDER — DONEPEZIL HCL 5 MG PO TABS
10.0000 mg | ORAL_TABLET | Freq: Every day | ORAL | Status: DC
  Administered 2017-05-31 – 2017-06-03 (×3): 10 mg via ORAL
  Filled 2017-05-31 (×3): qty 2

## 2017-05-31 MED ORDER — ACETAMINOPHEN 325 MG PO TABS
650.0000 mg | ORAL_TABLET | ORAL | Status: DC | PRN
  Administered 2017-06-01 – 2017-06-02 (×2): 650 mg via ORAL
  Filled 2017-05-31 (×2): qty 2

## 2017-05-31 MED ORDER — SALINE SPRAY 0.65 % NA SOLN
1.0000 | NASAL | Status: DC | PRN
  Administered 2017-06-03 – 2017-06-04 (×2): 1 via NASAL
  Filled 2017-05-31 (×2): qty 44

## 2017-05-31 MED ORDER — SODIUM CHLORIDE 0.9% FLUSH
3.0000 mL | Freq: Two times a day (BID) | INTRAVENOUS | Status: DC
  Administered 2017-05-31 – 2017-06-04 (×7): 3 mL via INTRAVENOUS

## 2017-05-31 MED ORDER — FOLIC ACID 1 MG PO TABS
1.0000 mg | ORAL_TABLET | Freq: Every day | ORAL | Status: DC
  Administered 2017-05-31 – 2017-06-03 (×3): 1 mg via ORAL
  Filled 2017-05-31 (×3): qty 1

## 2017-05-31 MED ORDER — ACETAMINOPHEN 500 MG PO TABS
1000.0000 mg | ORAL_TABLET | Freq: Four times a day (QID) | ORAL | Status: DC
  Administered 2017-05-31 – 2017-06-03 (×10): 1000 mg via ORAL
  Filled 2017-05-31 (×10): qty 2

## 2017-05-31 MED ORDER — INTEGRA PLUS PO CAPS
1.0000 | ORAL_CAPSULE | Freq: Every day | ORAL | Status: DC
Start: 2017-05-31 — End: 2017-05-31

## 2017-05-31 MED ORDER — MUPIROCIN 2 % EX OINT
TOPICAL_OINTMENT | Freq: Two times a day (BID) | CUTANEOUS | Status: DC
  Administered 2017-06-01 – 2017-06-05 (×8): via TOPICAL
  Filled 2017-05-31 (×3): qty 22

## 2017-05-31 MED ORDER — METOPROLOL SUCCINATE ER 25 MG PO TB24
12.5000 mg | ORAL_TABLET | Freq: Every day | ORAL | Status: DC
  Administered 2017-05-31 – 2017-06-03 (×3): 12.5 mg via ORAL
  Filled 2017-05-31 (×3): qty 1

## 2017-05-31 MED ORDER — ONDANSETRON HCL 4 MG/2ML IJ SOLN: 4.0000 mg | Freq: Four times a day (QID) | INTRAMUSCULAR | Status: DC | PRN

## 2017-05-31 MED ORDER — ASPIRIN EC 81 MG PO TBEC
81.0000 mg | DELAYED_RELEASE_TABLET | Freq: Every day | ORAL | Status: DC
  Administered 2017-05-31 – 2017-06-03 (×3): 81 mg via ORAL
  Filled 2017-05-31 (×3): qty 1

## 2017-05-31 MED ORDER — SODIUM CHLORIDE 0.9 % IV SOLN: 250.0000 mL | INTRAVENOUS | Status: DC | PRN

## 2017-05-31 NOTE — H&P (Signed)
History and Physical    [name]:[id number] DOB: 09/01/1932 DOA: 05/31/2017  PCP: [name], MD  Patient coming from: Home  Chief Complaint: Low oxygen sats  HPI: Patricia Villa is a 84 y.o. female with medical history significant of congestive heart failure, dementia, chronic respiratory failure on 2 L of oxygen, status post mitral valve replacement, HOCM, brought in by family members after found to be hypoxic at home.  Patient has been progressive worsening short of breath for a month per family report.  The patient is very pleasant and she denies any chest pain.  She is pleasantly confused.  She also states that her right hand is been a little swollen and red and joints have been swollen.  She denies any nausea vomiting diarrhea.  No family is present at this time.  Most of patient's history is obtained from ED staff and records.  Patient is found to be in heart failure and referred for admission for CHF exacerbation.  Review of Systems: As per HPI otherwise 10 point review of systems negative per patient but extremely unreliable  Past Medical History:  Diagnosis Date  . Alopecia   . Anxiety   . Arthritis   . Atrial fibrillation, persistent (HCC) 10/30/2015  . Cataract   . DDD (degenerative disc disease), cervical   . Diverticulosis of colon   . Hyperlipidemia   . Hypertension   . Hypertrophic cardiomyopathy (HCC)    by history but not identified on the most recent echo  . Meningioma (HCC)    cerebellopontine angle   . Mitral insufficiency    Severe with anterior flail leaflet.    . Osteoporosis   . Postmenopausal HRT (hormone replacement therapy)   . Pulmonary hypertension (HCC)   . 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 Vertebrobasilar insufficiency   . Vocal cord paralysis, unilateral complete     Past Surgical History:  Procedure Laterality Date  . ABDOMINAL HYSTERECTOMY    . CARDIAC CATHETERIZATION N/A  01/02/2016   Procedure: Right/Left Heart Cath and Coronary Angiography;  Surgeon: Tonny Bollman, MD;  Location: [facility] INVASIVE CV LAB;  Service: Cardiovascular;  Laterality: N/A;  . CATARACT EXTRACTION, BILATERAL    . EYE SURGERY    . TEE WITHOUT CARDIOVERSION N/A 12/12/2015   Procedure: TRANSESOPHAGEAL ECHOCARDIOGRAM (TEE);  Surgeon: Lewayne Bunting, MD;  Location: [facility] ENDOSCOPY;  Service: Cardiovascular;  Laterality: N/A;  . TONSILLECTOMY    . VEIN SURGERY       reports that  has never smoked. she has never used smokeless tobacco. She reports that she does not drink alcohol or use drugs.  Allergies  Allergen Reactions  . Diphenhydramine Hcl Hives  . Livalo [Pitavastatin Calcium] Other (See Comments)    Pt doesn't remember reaction  . Sulfa Antibiotics Other (See Comments)    Pt doesn't remember reaction  . Zocor [Simvastatin] Other (See Comments)    Pt doesn't remember reaction Leg cramping   . Actonel [Risedronate Sodium] Other (See Comments)    Esophagitis Esophagitis   . Fosamax [Alendronate Sodium] Other (See Comments)    Esophagitis  Esophagitis   . Pravastatin Other (See Comments)    Extreme leg cramping. Extreme leg cramping.    Family History  Problem Relation Age of Onset  . Other Brother        IHSS  . COPD Brother   . Diabetes Brother   . Heart disease  Brother   . Hyperlipidemia Brother   . Hypertension Brother   . Asthma Mother   . Other Sister        IHSS  . Other Sister        IHSS  . Cancer Cousin        thyroid    Prior to Admission medications   Medication Sig Start Date End Date Taking? Authorizing Provider  acetaminophen (TYLENOL) 500 MG tablet Take 1,000 mg by mouth every 6 (six) hours.    Yes [provider]  Cholecalciferol (VITAMIN D3) 2000 UNITS TABS Take 2,000 Units by mouth daily.   Yes [provider]  donepezil (ARICEPT) 10 MG tablet Take 1 tablet (10 mg total) by mouth at bedtime. Patient taking differently: Take 10  mg by mouth daily.  06/18/16  Yes Micki Riley, MD  DULoxetine (CYMBALTA) 30 MG capsule TAKE 1 CAPSULE BY MOUTH ONCE DAILY 03/19/17  Yes [name], MD  FeFum-FePoly-FA-B Cmp-C-Biot (INTEGRA PLUS) CAPS Take 1 capsule by mouth daily. 12/27/16  Yes [name], MD  folic acid (FOLVITE) 1 MG tablet Take 1 mg by mouth daily.   Yes [provider]  furosemide (LASIX) 40 MG tablet TAKE 1 TABLET BY MOUTH TWICE DAILY AS DIRECTED 02/07/17  Yes [name], MD  memantine (NAMENDA) 10 MG tablet Take 10 mg by mouth 2 (two) times daily. 05/21/17  Yes [provider]  metoprolol succinate (TOPROL-XL) 25 MG 24 hr tablet TAKE ONE-HALF TABLET BY MOUTH ONCE DAILY 12/20/16  Yes [name], MD  mupirocin ointment (BACTROBAN) 2 % Apply topically 2 (two) times daily. 04/03/17  Yes Johna Sheriff, MD  Triamcinolone Acetonide (TRIAMCINOLONE 0.1 % CREAM : EUCERIN) CREA Apply 1 application topically 2 (two) times daily. Patient taking differently: Apply 1 application topically 2 (two) times daily as needed for rash, itching or irritation.  10/03/16  Yes [name], MD  warfarin (COUMADIN) 5 MG tablet Take 2.5 mg by mouth daily at 6 PM.   Yes [provider]  Elastic Bandages & Supports (V-2 HIGH COMPRESSION HOSE) MISC Wear daily 01/23/17   Bennie Pierini, FNP  memantine [facility] TITRATION PAK) tablet pack 5 mg/day for =1 week; 5 mg twice daily for =1 week; 15 mg/day given in 5 mg and 10 mg separated doses for =1 week; then 10 mg twice daily Patient not taking: Reported on 05/31/2017 04/10/17   Micki Riley, MD  warfarin (COUMADIN) 5 MG tablet TAKE 1 TABLET BY MOUTH ONCE DAILY Patient not taking: Reported on 05/31/2017 01/01/17   [name], MD    Physical Exam: Vitals:   05/31/17 1304 05/31/17 1307 05/31/17 1657  BP: 116/84  134/76  Pulse: 76  93  Resp: 19  18  Temp: 97.8 F (36.6 C)  (!) 97.5 F (36.4 C)  TempSrc: Oral  Oral  SpO2: 95%    Weight: 63 kg  (139 lb) 59 kg (130 lb)   Height: 5\' 4"  (1.626 m) 5\' 4"  (1.626 m) 5\' 4"  (1.626 m)      Constitutional: NAD, calm, comfortable Vitals:   05/31/17 1304 05/31/17 1307 05/31/17 1657  BP: 116/84  134/76  Pulse: 76  93  Resp: 19  18  Temp: 97.8 F (36.6 C)  (!) 97.5 F (36.4 C)  TempSrc: Oral  Oral  SpO2: 95%    Weight: 63 kg (139 lb) 59 kg (130 lb)   Height: 5\' 4"  (1.626 m) 5\' 4"  (  1.626 m) 5\' 4"  (1.626 m)   Eyes: PERRL, lids and conjunctivae normal ENMT: Mucous membranes are moist. Posterior pharynx clear of any exudate or lesions.Normal dentition.  Neck: normal, supple, no masses, no thyromegaly Respiratory: clear to auscultation bilaterally, no wheezing, no crackles. Normal respiratory effort. No accessory muscle use.  Cardiovascular: Regular rate and rhythm, no murmurs / rubs / gallops. No extremity edema. 2+ pedal pulses. No carotid bruits.  Abdomen: no tenderness, no masses palpated. No hepatosplenomegaly. Bowel sounds positive.  Musculoskeletal: no clubbing / cyanosis. No joint deformity upper and lower extremities. Good ROM, no contractures. Normal muscle tone.  Except right hand on the dorsum is a little red it is not warm to touch it is not well demarcated does not appear to be cellulitis Skin: no rashes, lesions, ulcers. No induration Neurologic: CN 2-12 grossly intact. Sensation intact, DTR normal. Strength 5/5 in all 4.  Psychiatric: . Alert and oriented x 1. Normal mood.    Labs on Admission: I have personally reviewed following labs and imaging studies  CBC: Recent Labs  Lab 05/31/17 1346  WBC 8.2  NEUTROABS 6.0  HGB 12.9  HCT 40.6  MCV 107.7*  PLT 183   Basic Metabolic Panel: Recent Labs  Lab 05/31/17 1346  NA 139  K 3.8  CL 100*  CO2 26  GLUCOSE 91  BUN 26*  CREATININE 0.76  CALCIUM 8.8*   GFR: Estimated Creatinine Clearance: 46.8 mL/min (by C-G formula based on SCr of 0.76 mg/dL). Liver Function Tests: Recent Labs  Lab 05/31/17 1346  AST 45*   ALT 22  ALKPHOS 96  BILITOT 3.4*  PROT 7.2  ALBUMIN 2.9*   No results for input(s): LIPASE, AMYLASE in the last 168 hours. No results for input(s): AMMONIA in the last 168 hours. Coagulation Profile: No results for input(s): INR, PROTIME in the last 168 hours. Cardiac Enzymes: Recent Labs  Lab 05/31/17 1346  TROPONINI 0.08*   BNP (last 3 results) No results for input(s): PROBNP in the last 8760 hours. HbA1C: No results for input(s): HGBA1C in the last 72 hours. CBG: No results for input(s): GLUCAP in the last 168 hours. Lipid Profile: No results for input(s): CHOL, HDL, LDLCALC, TRIG, CHOLHDL, LDLDIRECT in the last 72 hours. Thyroid Function Tests: No results for input(s): TSH, T4TOTAL, FREET4, T3FREE, THYROIDAB in the last 72 hours. Anemia Panel: No results for input(s): VITAMINB12, FOLATE, FERRITIN, TIBC, IRON, RETICCTPCT in the last 72 hours. Urine analysis:    Component Value Date/Time   COLORURINE YELLOW 08/02/2016 2133   APPEARANCEUR Clear 10/23/2016 1221   LABSPEC 1.020 08/02/2016 2133   PHURINE 5.0 08/02/2016 2133   GLUCOSEU Negative 10/23/2016 1221   HGBUR NEGATIVE 08/02/2016 2133   BILIRUBINUR Negative 10/23/2016 1221   KETONESUR NEGATIVE 08/02/2016 2133   PROTEINUR Trace (A) 10/23/2016 1221   PROTEINUR 30 (A) 08/02/2016 2133   UROBILINOGEN negative 06/14/2015 1646   UROBILINOGEN 0.2 01/20/2013 0411   NITRITE Positive (A) 10/23/2016 1221   NITRITE NEGATIVE 08/02/2016 2133   LEUKOCYTESUR 2+ (A) 10/23/2016 1221   Sepsis Labs: !!!!!!!!!!!!!!!!!!!!!!!!!!!!!!!!!!!!!!!!!!!! @LABRCNTIP (procalcitonin:4,lacticidven:4) )No results found for this or any previous visit (from the past 240 hour(s)).   Radiological Exams on Admission: Dg Chest Port 1 View  Result Date: 05/31/2017 CLINICAL DATA:  Shortness of breath, fatigue, RIGHT hand swelling and redness for 1 week, history atrial fibrillation, hypertension EXAM: PORTABLE CHEST 1 VIEW COMPARISON:  07/23/2013,  02/17/2017 FINDINGS: Enlargement of cardiac silhouette with pulmonary vascular congestion. Atherosclerotic calcification aorta. Diffuse  BILATERAL pulmonary infiltrates consistent with pulmonary edema and CHF, new. Large LEFT pleural effusion with significant atelectasis of the mid to lower LEFT lung, significantly increased since 02/17/2017. No definite RIGHT pleural effusion. No pneumothorax. Bones unremarkable. IMPRESSION: CHF with significant interval increase in LEFT pleural effusion and LEFT lung atelectasis since 02/17/2017. Electronically Signed   By: Ulyses Southward M.D.   On: 05/31/2017 14:37    Old chart reviewed  Case discussed with EDP EKG reviewed A. fib rate controlled  Assessment/Plan 84 year old female with acute on chronic congestive heart failure exacerbation Principal Problem:   CHF exacerbation (HCC)-patient did have recent echo which showed normal EF and normal mitral valve.  From November 2018.  Placed on Lasix 40 mg IV every 12.  Unsure who is given her medications at home she is reportedly on Lasix 40 mg p.o. twice a day.  Her O2 sats here are normal on her chronic 2 L of oxygen and she is not hypoxic.  Family reported in the ED that her O2 sats were 50% at home.  Troponin is mildly elevated also will continue to serial.  Is currently 0.08.  Patient asymptomatic of this.  She is on Coumadin continue this and aspirin.  We will not repeat echo at this time.  Active Problems:   Essential hypertension, benign-stable   Mitral valve disorder-stable   Hypertrophic obstructive cardiomyopathy (HCC)-noted   Generalized anxiety disorder-stable at this time   Chronic neck pain-continue home medication regimen   Atrial fibrillation, persistent (HCC)-rate controlled continue Coumadin   Secondary pulmonary arterial hypertension (HCC)-noted   Patient has DNR paperwork at her bedside and on her chart   DVT prophylaxis: Coumadin  None code Status: DNR Family Communication:  None Disposition Plan: Day team Consults called: None Admission status: Admission   Kierrah Kilbride A MD [facility]  If 7PM-7AM, please contact night-coverage [email or web address] Password [email or web address]  05/31/2017, 5:10 PM

## 2017-05-31 NOTE — ED Notes (Signed)
CRITICAL VALUE ALERT  Critical Value:  Troponin 0.08  Date & Time Notied:  05/31/2017 @ 1533  Provider Notified: Dr. Eulis Foster made aware  Orders Received/Actions taken: No new orders obtained

## 2017-05-31 NOTE — ED Notes (Signed)
ED Provider at bedside. 

## 2017-05-31 NOTE — ED Notes (Signed)
Patient complaining of right shoulder pain x1 week. No known injury. Denies chest pain or shortness of breat,

## 2017-05-31 NOTE — ED Triage Notes (Signed)
Patient reports of generalized fatigue/right hand swelling/redness x1 week. Cms in tact pulses noted in right radius.

## 2017-05-31 NOTE — ED Provider Notes (Signed)
[facility] EMERGENCY DEPARTMENT Provider Note   CSN: [id number] Arrival date & time: 05/31/17  1251     History   Chief Complaint Chief Complaint  Patient presents with  . Fatigue  . Hand Problem    HPI Patricia Villa is a 84 y.o. female.  She presents for evaluation of malaise, increasing trouble breathing, and low oxygen saturation.  She is also had swelling of her right hand for a couple weeks without known trauma.  This morning her son found her sitting on the side of her bed, with her oxygen on and complaining of shortness of breath.  At that time he checked her oxygen saturation and it was less than 60%.  He was concerned that it was so low.  She also had an episode of low oxygen saturation, last week when her home health nurse was visiting.  The patient lives with her husband, and his family member come in frequently.  Family members help with her parents with cooking and cleaning.  Patient has to be restless and frequently gets up at night and walks with her walker.  She has been using her oxygen more and more for the last couple months.  Previously she has been using it as needed.  Family members are concerned that she has poor oral intake.  There is been no known trauma to the right hand.  She has not seen her PCP about the hand problem yet.  There are no other known modifying factors.   HPI  Past Medical History:  Diagnosis Date  . Alopecia   . Anxiety   . Arthritis   . Atrial fibrillation, persistent ([place]) 10/30/2015  . Cataract   . DDD (degenerative disc disease), cervical   . Diverticulosis of colon   . Hyperlipidemia   . Hypertension   . Hypertrophic cardiomyopathy ([place])    by history but not identified on the most recent echo  . Meningioma (HCC)    cerebellopontine angle   . Mitral insufficiency    Severe with anterior flail leaflet.    . Osteoporosis   . Postmenopausal HRT (hormone replacement therapy)   . Pulmonary hypertension ([place])   . Stroke due to  embolism of left cerebellar artery (HCC)    Silent - noted on MRI  . Vertebrobasilar insufficiency   . Vocal cord paralysis, unilateral complete     Patient Active Problem List   Diagnosis Date Noted  . Secondary pulmonary arterial hypertension ([place]) 07/05/2016  . Dysuria 06/12/2016  . History of cystocele 06/12/2016  . Vocal cord paralysis, unilateral complete   . Vertebrobasilar insufficiency   . Stroke due to embolism of left cerebellar artery ([place])   . Aortic atherosclerosis ([place]) 11/20/2015  . Atrial fibrillation, persistent ([place]) 10/30/2015  . Atrial fibrillation ([place]) 11/23/2014  . Osteoporosis 08/31/2014  . Chronic neck pain 06/07/2014  . DDD (degenerative disc disease), cervical 01/24/2014  . Hyperlipidemia 05/17/2013  . Colitis 01/20/2013  . Generalized anxiety disorder 08/05/2012  . Meningioma ([place]) 08/05/2012  . Bruit 10/17/2010  . Essential hypertension, benign 10/04/2009  . MITRAL INSUFFICIENCY 09/26/2009  . Mitral valve disorder 09/26/2009  . Hypertrophic obstructive cardiomyopathy ([place]) 09/26/2009    Past Surgical History:  Procedure Laterality Date  . ABDOMINAL HYSTERECTOMY    . CARDIAC CATHETERIZATION N/A 01/02/2016   Procedure: Right/Left Heart Cath and Coronary Angiography;  Surgeon: Sherren Mocha, MD;  Location: [facility] CV LAB;  Service: Cardiovascular;  Laterality: N/A;  . CATARACT EXTRACTION, BILATERAL    .  EYE SURGERY    . TEE WITHOUT CARDIOVERSION N/A 12/12/2015   Procedure: TRANSESOPHAGEAL ECHOCARDIOGRAM (TEE);  Surgeon: Lelon Perla, MD;  Location: [facility] ENDOSCOPY;  Service: Cardiovascular;  Laterality: N/A;  . TONSILLECTOMY    . VEIN SURGERY      OB History    No data available       Home Medications    Prior to Admission medications   Medication Sig Start Date End Date Taking? Authorizing Provider  acetaminophen (TYLENOL) 500 MG tablet Take 1,000 mg by mouth every 6 (six) hours.    Yes [provider]  Cholecalciferol  (VITAMIN D3) 2000 UNITS TABS Take 2,000 Units by mouth daily.   Yes [provider]  donepezil (ARICEPT) 10 MG tablet Take 1 tablet (10 mg total) by mouth at bedtime. Patient taking differently: Take 10 mg by mouth daily.  06/18/16  Yes Garvin Fila, MD  DULoxetine (CYMBALTA) 30 MG capsule TAKE 1 CAPSULE BY MOUTH ONCE DAILY 03/19/17  Yes Chipper Herb, MD  FeFum-FePoly-FA-B Cmp-C-Biot (INTEGRA PLUS) CAPS Take 1 capsule by mouth daily. 12/27/16  Yes Chipper Herb, MD  folic acid (FOLVITE) 1 MG tablet Take 1 mg by mouth daily.   Yes [provider]  furosemide (LASIX) 40 MG tablet TAKE 1 TABLET BY MOUTH TWICE DAILY AS DIRECTED 02/07/17  Yes Chipper Herb, MD  memantine (NAMENDA) 10 MG tablet Take 10 mg by mouth 2 (two) times daily. 05/21/17  Yes [provider]  metoprolol succinate (TOPROL-XL) 25 MG 24 hr tablet TAKE ONE-HALF TABLET BY MOUTH ONCE DAILY 12/20/16  Yes Chipper Herb, MD  mupirocin ointment (BACTROBAN) 2 % Apply topically 2 (two) times daily. 04/03/17  Yes Eustaquio Maize, MD  Triamcinolone Acetonide (TRIAMCINOLONE 0.1 % CREAM : EUCERIN) CREA Apply 1 application topically 2 (two) times daily. Patient taking differently: Apply 1 application topically 2 (two) times daily as needed for rash, itching or irritation.  10/03/16  Yes Chipper Herb, MD  warfarin (COUMADIN) 5 MG tablet Take 2.5 mg by mouth daily at 6 PM.   Yes [provider]  Elastic Bandages & Supports (V-2 HIGH COMPRESSION HOSE) [place] Wear daily 01/23/17   Hassell Done, Mary-Margaret, FNP  memantine [facility] TITRATION PAK) tablet pack 5 mg/day for =1 week; 5 mg twice daily for =1 week; 15 mg/day given in 5 mg and 10 mg separated doses for =1 week; then 10 mg twice daily Patient not taking: Reported on 05/31/2017 04/10/17   Garvin Fila, MD  warfarin (COUMADIN) 5 MG tablet TAKE 1 TABLET BY MOUTH ONCE DAILY Patient not taking: Reported on 05/31/2017 01/01/17   Chipper Herb, MD    Family  History Family History  Problem Relation Age of Onset  . Other Brother        IHSS  . COPD Brother   . Diabetes Brother   . Heart disease Brother   . Hyperlipidemia Brother   . Hypertension Brother   . Asthma Mother   . Other Sister        IHSS  . Other Sister        IHSS  . Cancer Cousin        thyroid    Social History Social History   Tobacco Use  . Smoking status: Never Smoker  . Smokeless tobacco: Never Used  . Tobacco comment: Rare previously  Substance Use Topics  . Alcohol use: No    Alcohol/week: 0.0 oz  . Drug use:  No     Allergies   Diphenhydramine hcl; Livalo [pitavastatin calcium]; Sulfa antibiotics; Zocor [simvastatin]; Actonel [risedronate sodium]; Fosamax [alendronate sodium]; and Pravastatin   Review of Systems Review of Systems  All other systems reviewed and are negative.    Physical Exam Updated Vital Signs BP 116/84 (BP Location: Right Arm)   Pulse 76   Temp 97.8 F (36.6 C) (Oral)   Resp 19   Ht 5\' 4"  (1.626 m)   Wt 59 kg (130 lb)   LMP 08/01/1982   SpO2 95%   BMI 22.31 kg/m   Physical Exam  Constitutional: She is oriented to person, place, and time. She appears well-developed. She appears distressed (She is uncomfortable).  Elderly, frail  HENT:  Head: Normocephalic and atraumatic.  Eyes: Conjunctivae and EOM are normal. Pupils are equal, round, and reactive to light.  Neck: Normal range of motion and phonation normal. Neck supple.  Cardiovascular: Normal rate and regular rhythm.  Pulmonary/Chest: Effort normal. No stridor. No respiratory distress. She has no wheezes. She exhibits no tenderness.  Respirations with decreased air movement left base.  Abdominal: Soft. She exhibits no distension. There is no tenderness. There is no guarding.  Musculoskeletal: Normal range of motion.  Right hand is diffusely tender and swollen, with redness.  This includes all fingers, and the redness extends above the wrist.  There is no  associated fluctuance, drainage, or skin lesions.  See image in chart.  Neurological: She is alert and oriented to person, place, and time. She exhibits normal muscle tone.  No dysarthria or aphasia  Skin: Skin is warm and dry.  Psychiatric: She has a normal mood and affect. Her behavior is normal. Judgment and thought content normal.  Nursing note and vitals reviewed.       ED Treatments / Results  Labs (all labs ordered are listed, but only abnormal results are displayed) Labs Reviewed  COMPREHENSIVE METABOLIC PANEL - Abnormal; Notable for the following components:      Result Value   Chloride 100 (*)    BUN 26 (*)    Calcium 8.8 (*)    Albumin 2.9 (*)    AST 45 (*)    Total Bilirubin 3.4 (*)    All other components within normal limits  CBC WITH DIFFERENTIAL/PLATELET - Abnormal; Notable for the following components:   RBC 3.77 (*)    MCV 107.7 (*)    MCH 34.2 (*)    RDW 15.8 (*)    Monocytes Absolute 1.1 (*)    All other components within normal limits  BRAIN NATRIURETIC PEPTIDE - Abnormal; Notable for the following components:   B Natriuretic Peptide 1,714.0 (*)    All other components within normal limits  TROPONIN I - Abnormal; Notable for the following components:   Troponin I 0.08 (*)    All other components within normal limits  URIC ACID  URINALYSIS, ROUTINE W REFLEX MICROSCOPIC    EKG  EKG Interpretation  Date/Time:  Saturday May 31 2017 13:05:52 EST Ventricular Rate:  81 PR Interval:    QRS Duration: 107 QT Interval:  431 QTC Calculation: 501 R Axis:   162 Text Interpretation:  Atrial fibrillation Low voltage, precordial leads Probable right ventricular hypertrophy Prolonged QT interval Since last tracing QT has lengthened Confirmed by Daleen Bo [phone]) on 05/31/2017 1:18:48 PM       Radiology Dg Chest Port 1 View  Result Date: 05/31/2017 CLINICAL DATA:  Shortness of breath, fatigue, RIGHT hand swelling and redness for  1 week, history  atrial fibrillation, hypertension EXAM: PORTABLE CHEST 1 VIEW COMPARISON:  07/23/2013, 02/17/2017 FINDINGS: Enlargement of cardiac silhouette with pulmonary vascular congestion. Atherosclerotic calcification aorta. Diffuse BILATERAL pulmonary infiltrates consistent with pulmonary edema and CHF, new. Large LEFT pleural effusion with significant atelectasis of the mid to lower LEFT lung, significantly increased since 02/17/2017. No definite RIGHT pleural effusion. No pneumothorax. Bones unremarkable. IMPRESSION: CHF with significant interval increase in LEFT pleural effusion and LEFT lung atelectasis since 02/17/2017. Electronically Signed   By: Lavonia Dana M.D.   On: 05/31/2017 14:37    Procedures .Critical Care Performed by: Daleen Bo, MD Authorized by: Daleen Bo, MD   Critical care provider statement:    Critical care time (minutes):  40   Critical care start time:  05/31/2017 1:30 PM   Critical care end time:  05/31/2017 2:54 PM   Critical care was necessary to treat or prevent imminent or life-threatening deterioration of the following conditions:  Circulatory failure and respiratory failure   Critical care was time spent personally by me on the following activities:  Blood draw for specimens, discussions with consultants, evaluation of patient's response to treatment, examination of patient, obtaining history from patient or surrogate, ordering and performing treatments and interventions, ordering and review of laboratory studies, ordering and review of radiographic studies, pulse oximetry, re-evaluation of patient's condition and review of old charts       (including critical care time)  Medications Ordered in ED Medications  furosemide (LASIX) injection 40 mg (40 mg Intravenous Given 05/31/17 1538)     Initial Impression / Assessment and Plan / ED Course  I have reviewed the triage vital signs and the nursing notes.  Pertinent labs & imaging results that were available  during my care of the patient were reviewed by me and considered in my medical decision making (see chart for details).  Clinical Course as of May 31 1544  Sat May 31, 2017  1448 Elevated B Natriuretic Peptide: (!) 1,714.0 [EW]  1448 Sodium: 139 [EW]  1449 Normal Creatinine: 0.76 [EW]  1449 Elevated Total Bilirubin: (!) 3.4 [EW]  1449 Normal WBC: 8.2 [EW]    Clinical Course User Index [EW] Daleen Bo, MD     Patient Vitals for the past 24 hrs:  BP Temp Temp src Pulse Resp SpO2 Height Weight  05/31/17 1307 - - - - - - 5\' 4"  (1.626 m) 59 kg (130 lb)  05/31/17 1304 116/84 97.8 F (36.6 C) Oral 76 19 95 % 5\' 4"  (1.626 m) 63 kg (139 lb)    4:35 PM Reevaluation with update and discussion. After initial assessment and treatment, an updated evaluation reveals no change in clinical status.  Family members feel that the patient might be putting something on her right hand which is causing it to turn red.  I suspect that she might be using triamcinolone on it and that previously she has had a reaction to this medicine, causing redness when she put it on her leg. Daleen Bo   3:37 PM-Consult complete with hospitalist. Patient case explained and discussed.  She agrees to admit patient for further evaluation and treatment, assuming that cardiology does not think she needs to be transferred to [facility].. Call ended at 15:45  16: 00: Case discussed with Dr. Stanford Breed, cardiology in [place].  He is comfortable with patient staying here to receive Lasix and monitoring.  He feels that the mild troponin elevation is "normal."  Final Clinical Impressions(s) / ED Diagnoses  Final diagnoses:  Acute on chronic congestive heart failure, unspecified heart failure type (HCC)  Hyperbilirubinemia  Swelling of right hand  Hypoxia   Evaluation consistent CHF complicated by hypoxemia, at home.  She with history of pulmonary arterial hypertension, treated at home with oxygen.  She has had gradual  decompensation recently, and has progression of disease.  Elevated BNP consistent with acute CHF.  Mild troponin elevation is likely related to congestive heart failure secondarily.  Incidental elevation in total bilirubin, is nonspecific.  CBC and hemoglobin are normal.  Patient will need to be admitted for monitoring and cardiology and pulmonary consultations.  His cardiac echo showing LV function was in July 2017, LV function was normal.  Possible decompensated mitral valve disease status post surgical repair.  No clear cause for right hand redness, it has the appearance of an inflammatory process.  Recommend treatment with elevation, and reassess.  Nursing Notes Reviewed/ Care Coordinated Applicable Imaging Reviewed Interpretation of Laboratory Data incorporated into ED treatment   Plan: [place]  ED Discharge Orders    None       Daleen Bo, MD 05/31/17 1635

## 2017-06-01 LAB — BASIC METABOLIC PANEL
ANION GAP: 12 (ref 5–15)
BUN: 25 mg/dL — ABNORMAL HIGH (ref 6–20)
CALCIUM: 9.2 mg/dL (ref 8.9–10.3)
CO2: 28 mmol/L (ref 22–32)
Chloride: 99 mmol/L — ABNORMAL LOW (ref 101–111)
Creatinine, Ser: 0.84 mg/dL (ref 0.44–1.00)
Glucose, Bld: 93 mg/dL (ref 65–99)
POTASSIUM: 3.8 mmol/L (ref 3.5–5.1)
Sodium: 139 mmol/L (ref 135–145)

## 2017-06-01 LAB — PROTIME-INR
INR: 2.65
Prothrombin Time: 28 seconds — ABNORMAL HIGH (ref 11.4–15.2)

## 2017-06-01 LAB — TROPONIN I
TROPONIN I: 0.09 ng/mL — AB (ref <0.03)
Troponin I: 0.1 ng/mL (ref <0.03)

## 2017-06-01 MED ORDER — POLYETHYLENE GLYCOL 3350 17 G PO PACK
17.0000 g | PACK | Freq: Two times a day (BID) | ORAL | Status: DC
  Administered 2017-06-01 – 2017-06-03 (×5): 17 g via ORAL
  Filled 2017-06-01 (×6): qty 1

## 2017-06-01 MED ORDER — WARFARIN - PHYSICIAN DOSING INPATIENT: Freq: Every day | Status: DC

## 2017-06-01 MED ORDER — BISACODYL 10 MG RE SUPP
10.0000 mg | Freq: Once | RECTAL | Status: AC
  Administered 2017-06-01: 10 mg via RECTAL
  Filled 2017-06-01: qty 1

## 2017-06-01 NOTE — Progress Notes (Signed)
Patient Demographics:    Patricia Villa, is a 84 y.o. female, DOB - 1932/09/13, MWN:[id number]  Admit date - 05/31/2017   Admitting Physician [name], MD  Outpatient Primary MD for the patient is [name], MD  LOS - 1   Chief Complaint  Patient presents with  . Fatigue  . Hand Problem        Subjective:    Patricia Villa today has no fevers, no emesis,  Son (Patricia Villa) at bedside,    Assessment  & Plan :    Principal Problem:   CHF exacerbation (HCC) Active Problems:   Essential hypertension, benign   Mitral valve disorder   Hypertrophic obstructive cardiomyopathy (HCC)   Generalized anxiety disorder   Chronic neck pain   Atrial fibrillation, persistent (HCC)   Secondary pulmonary arterial hypertension (HCC)   Swelling of right hand   Brief Summary: 84 year old female with past medical history relevant for HFpEF (Last EF > 60 %), prior mitral valve clip, HOCM, dementia with significant cognitive deficits, chronic hypoxic respiratory failure usually on 2 L of oxygen at home who was admitted on 05/31/2017 with concerns about acute exacerbation of CHF and worsening hypoxia and dyspnea.  She is found to have significant left-sided pleural effusion.     1)HFpEF-patient is admitted with acute on chronic diastolic CHF exacerbation, last known EF > 60 %, dyspnea and hypoxia worsened, clinically and radiologically patient has very symptomatic left-sided rather large pleural effusion, will Villa likely require therapeutic thoracentesis.  Patient is on a POA is agreeable for this procedure to be done by radiology on 06/02/2017, continue IV diuresis with Lasix 40 mg every 12 hours, c/n supplemental oxygen at this time, continue to monitor daily weights and fluid input and output, BNP was 1714 (no prior BNP for comparison),  borderline troponin elevation in the setting of CHF exacerbation, doubt  frank ACS  2)H/o AFib-continue metoprolol XL 12.5 mg  daily for rate control,  INR is currently 2.6,  hold Coumadin to allow for left-sided therapeutic thoracentesis, please restart Coumadin after procedure if and when appropriate  3)Large symptomatic left-sided pleural effusion-patient will need image guided therapeutic left-sided thoracentesis for symptomatic left-sided pleural effusion patient's son Patricia Villa is patient's POA and he is agreeable to sign for procedure  4)Social/Ethics-plan of care discussed with patient's son Patricia Villa) who is also patient's POA, patient is a DNR/DNI.   5)Advanced Dementia-patient has severe cognitive deficits, no significant behavioral disturbance, continue Aricept 10 mg daily, Cymbalta 30 mg daily, and Namenda 10 mg twice daily  Disposition Plan  : TBD  Consults  :  IR/radiology for ultrasound-guided left sided thoracentesis   DVT Prophylaxis  :    SCDs /coumadin  Lab Results  Component Value Date   PLT 183 05/31/2017    Inpatient Medications  Scheduled Meds: . acetaminophen  1,000 mg Oral Q6H  . aspirin EC  81 mg Oral Daily  . cholecalciferol  2,000 Units Oral Daily  . donepezil  10 mg Oral Daily  . DULoxetine  30 mg Oral Daily  . folic acid  1 mg Oral Daily  . furosemide  40 mg Intravenous Q12H  . iron polysaccharides  150 mg Oral Daily  . memantine  10 mg  Oral BID  . metoprolol succinate  12.5 mg Oral Daily  . mupirocin ointment   Topical BID  . polyethylene glycol  17 g Oral BID  . sodium chloride flush  3 mL Intravenous Q12H   Continuous Infusions: . sodium chloride     PRN Meds:.sodium chloride, acetaminophen, ondansetron (ZOFRAN) IV, sodium chloride, sodium chloride flush    Anti-infectives (From admission, onward)   None        Objective:   Vitals:   05/31/17 2029 05/31/17 2124 06/01/17 0446 06/01/17 1454  BP: 117/81  (!) 97/59 (!) 104/59  Pulse: 60  (!) 54 66  Resp: 18   18  Temp: 98 F (36.7 C)  (!) 97.5 F (36.4  C) 97.7 F (36.5 C)  TempSrc: Oral  Oral Oral  SpO2: 93% 93% 90% 94%  Weight:   60 kg (132 lb 4.4 oz)   Height:        Wt Readings from Last 3 Encounters:  06/01/17 60 kg (132 lb 4.4 oz)  05/15/17 63 kg (139 lb)  04/23/17 63.5 kg (140 lb)     Intake/Output Summary (Last 24 hours) at 06/01/2017 1729 Last data filed at 06/01/2017 1200 Gross per 24 hour  Intake 480 ml  Output -  Net 480 ml     Physical Exam  Gen:- Awake, pleasantly confused, in no acute distress  HEENT:- [place].AT, No sclera icterus Nose- [place] at 3 L/min Neck-Supple Neck,No JVD,.  Lungs-diminished in bases, breath sounds are very diminished on the left, CV- S1, S2 normal, irregular , HR 64  Abd-  +ve B.Sounds, Abd Soft, No tenderness,    Psych-pleasant, significant cognitive deficits Neuro-generalized weakness, no new focal deficits    Data Review:   Micro Results No results found for this or any previous visit (from the past 240 hour(s)).  Radiology Reports Dg Chest Port 1 View  Result Date: 05/31/2017 CLINICAL DATA:  Shortness of breath, fatigue, RIGHT hand swelling and redness for 1 week, history atrial fibrillation, hypertension EXAM: PORTABLE CHEST 1 VIEW COMPARISON:  07/23/2013, 02/17/2017 FINDINGS: Enlargement of cardiac silhouette with pulmonary vascular congestion. Atherosclerotic calcification aorta. Diffuse BILATERAL pulmonary infiltrates consistent with pulmonary edema and CHF, new. Large LEFT pleural effusion with significant atelectasis of the mid to lower LEFT lung, significantly increased since 02/17/2017. No definite RIGHT pleural effusion. No pneumothorax. Bones unremarkable. IMPRESSION: CHF with significant interval increase in LEFT pleural effusion and LEFT lung atelectasis since 02/17/2017. Electronically Signed   By: Ulyses Southward M.D.   On: 05/31/2017 14:37     CBC Recent Labs  Lab 05/31/17 1346  WBC 8.2  HGB 12.9  HCT 40.6  PLT 183  MCV 107.7*  MCH 34.2*  MCHC 31.8  RDW 15.8*    LYMPHSABS 1.1  MONOABS 1.1*  EOSABS 0.1  BASOSABS 0.0    Chemistries  Recent Labs  Lab 05/31/17 1346 06/01/17 0509  NA 139 139  K 3.8 3.8  CL 100* 99*  CO2 26 28  GLUCOSE 91 93  BUN 26* 25*  CREATININE 0.76 0.84  CALCIUM 8.8* 9.2  AST 45*  --   ALT 22  --   ALKPHOS 96  --   BILITOT 3.4*  --    ------------------------------------------------------------------------------------------------------------------ No results for input(s): CHOL, HDL, LDLCALC, TRIG, CHOLHDL, LDLDIRECT in the last 72 hours.  No results found for: HGBA1C ------------------------------------------------------------------------------------------------------------------ No results for input(s): TSH, T4TOTAL, T3FREE, THYROIDAB in the last 72 hours.  Invalid input(s): FREET3 ------------------------------------------------------------------------------------------------------------------ No results for input(s):  VITAMINB12, FOLATE, FERRITIN, TIBC, IRON, RETICCTPCT in the last 72 hours.  Coagulation profile Recent Labs  Lab 06/01/17 1339  INR 2.65    No results for input(s): DDIMER in the last 72 hours.  Cardiac Enzymes Recent Labs  Lab 05/31/17 1844 05/31/17 2254 06/01/17 0509  TROPONINI 0.09* 0.10* 0.09*   ------------------------------------------------------------------------------------------------------------------    Component Value Date/Time   BNP 1,714.0 (H) 05/31/2017 1346     Leann Mayweather M.D on 06/01/2017 at 5:29 PM  Between 7am to 7pm - Pager - [phone]  After 7pm go to [email or web address] - password [email or web address]  [facility] -  Office  [phone]   Voice Recognition Reubin Milan dictation system was used to create this note, attempts have been made to correct errors. Please contact the author with questions and/or clarifications.

## 2017-06-02 ENCOUNTER — Inpatient Hospital Stay (HOSPITAL_COMMUNITY): Payer: Medicare HMO

## 2017-06-02 DIAGNOSIS — R188 Other ascites: Secondary | ICD-10-CM

## 2017-06-02 DIAGNOSIS — I5043 Acute on chronic combined systolic (congestive) and diastolic (congestive) heart failure: Secondary | ICD-10-CM

## 2017-06-02 LAB — BLOOD GAS, ARTERIAL
Acid-Base Excess: 1.9 mmol/L (ref 0.0–2.0)
BICARBONATE: 26.4 mmol/L (ref 20.0–28.0)
Drawn by: 23534
O2 Content: 10 L/min
O2 Saturation: 95.7 %
Patient temperature: 37
pCO2 arterial: 35.7 mmHg (ref 32.0–48.0)
pH, Arterial: 7.465 — ABNORMAL HIGH (ref 7.350–7.450)
pO2, Arterial: 81 mmHg — ABNORMAL LOW (ref 83.0–108.0)

## 2017-06-02 LAB — BODY FLUID CELL COUNT WITH DIFFERENTIAL
EOS FL: 0 %
Lymphs, Fluid: 32 %
Monocyte-Macrophage-Serous Fluid: 13 % — ABNORMAL LOW (ref 50–90)
Neutrophil Count, Fluid: 55 % — ABNORMAL HIGH (ref 0–25)
WBC FLUID: 107 uL (ref 0–1000)

## 2017-06-02 LAB — BASIC METABOLIC PANEL
Anion gap: 14 (ref 5–15)
BUN: 25 mg/dL — AB (ref 6–20)
CHLORIDE: 95 mmol/L — AB (ref 101–111)
CO2: 26 mmol/L (ref 22–32)
Calcium: 8.9 mg/dL (ref 8.9–10.3)
Creatinine, Ser: 0.75 mg/dL (ref 0.44–1.00)
GFR calc Af Amer: >60 mL/min (ref >60)
GFR calc non Af Amer: >60 mL/min (ref >60)
GLUCOSE: 87 mg/dL (ref 65–99)
Potassium: 3.5 mmol/L (ref 3.5–5.1)
SODIUM: 135 mmol/L (ref 135–145)

## 2017-06-02 LAB — GRAM STAIN

## 2017-06-02 LAB — LACTATE DEHYDROGENASE, PLEURAL OR PERITONEAL FLUID: LD, Fluid: 89 U/L — ABNORMAL HIGH (ref 3–23)

## 2017-06-02 LAB — MRSA PCR SCREENING: MRSA BY PCR: NEGATIVE

## 2017-06-02 LAB — PROTIME-INR
INR: 2.97
PROTHROMBIN TIME: 30.7 s — AB (ref 11.4–15.2)

## 2017-06-02 LAB — PROTEIN, PLEURAL OR PERITONEAL FLUID: Total protein, fluid: <3 g/dL

## 2017-06-02 LAB — LACTATE DEHYDROGENASE: LDH: 242 U/L — AB (ref 98–192)

## 2017-06-02 MED ORDER — MORPHINE SULFATE (PF) 2 MG/ML IV SOLN
2.0000 mg | INTRAVENOUS | Status: DC | PRN
  Administered 2017-06-03 – 2017-06-04 (×5): 2 mg via INTRAVENOUS
  Filled 2017-06-02 (×5): qty 1

## 2017-06-02 MED ORDER — FUROSEMIDE 10 MG/ML IJ SOLN: 80.0000 mg | Freq: Three times a day (TID) | INTRAMUSCULAR | Status: DC

## 2017-06-02 MED ORDER — SODIUM CHLORIDE 0.9 % IV SOLN
1.5000 g | Freq: Four times a day (QID) | INTRAVENOUS | Status: DC
  Administered 2017-06-02 – 2017-06-04 (×7): 1.5 g via INTRAVENOUS
  Filled 2017-06-02 (×12): qty 1.5

## 2017-06-02 MED ORDER — FUROSEMIDE 10 MG/ML IJ SOLN
10.0000 mg/h | INTRAVENOUS | Status: AC
  Administered 2017-06-02: 10 mg/h via INTRAVENOUS
  Filled 2017-06-02: qty 25

## 2017-06-02 NOTE — Care Management Note (Signed)
Case Management Note  Patient Details  Name: Patricia Villa MRN: [id number] Date of Birth: 01/30/1933  Subjective/Objective:               Pt admitted with CHF. Information gathered from two sons at bedside. She is from home, lives with her husband, has aid 8:30-2:30. Her husband is home at night but is physically unable care for her, however, he is a presence and can call for help if he needs to. Pt is active with [facility] for nursing and PT. Son asks about switching to [facility], they have used them in the past and would prefer they come. Son concerned about pt being too weak to return home. Daughter is in contact with [employer] about arranging for PD caregivers, this is not in place yet. Pt has home oxygen and per son they have no DME needs at home.  This is pt's only admission in 6 months. Is on [facility] registry.     Action/Plan: PT eval pending. May need SNF.  If DC home, family will pursue PD care giving. CM will make referral to [facility], family will have to contact [place] about terminating services.  CM will cont to follow.   Expected Discharge Date:  06/03/17               Expected Discharge Plan:  [facility](VS SNF)  In-House Referral:     Discharge planning Services  CM Consult  Post Acute Care Choice:  Home Health Choice offered to:  Adult Children, HC POA / Guardian  DME Arranged:    DME Agency:     HH Arranged:  RN, PT [place] Agency:  [place]  Status of Service:  In process, will continue to follow  If discussed at Long Length of Stay Meetings, dates discussed:    Additional Comments:  Sherald Barge, RN 06/02/2017, 10:54 AM

## 2017-06-02 NOTE — Procedures (Signed)
PreOperative Dx: LEFT pleural effusion Postoperative Dx: LEFT pleural effusion Procedure:   US guided LEFt thoracentesis Radiologist:  Thornton Papas Anesthesia:  10 ml of 1% lidocaine Specimen:  1.3 L of serosanguinous fluid EBL:   < 1 ml Complications: Small  LEFT apex PTX

## 2017-06-02 NOTE — Progress Notes (Signed)
Thoracentesis complete, 1.3L red colored pleural fluid removed. C/o right side pain, taken to x-ray for post thoracentesis chest x-ray. Accompanied to pt room no change of patient condition from previous. Report given to [facility] and Pt nurse. Hospitalist entered room upon my exit.

## 2017-06-02 NOTE — Progress Notes (Signed)
Pharmacy Antibiotic Note  Patricia Villa is a 84 y.o. female admitted on 05/31/2017 with pneumonia.  Pharmacy has been consulted for UNASYN dosing.  Plan: Unasyn 1.5gm IV q6hrs Monitor labs, progress, cultures  Antimicrobials this admission: Unasyn 1/28 >>   Dose adjustments this admission:  Microbiology results:  BCx:   UCx:    Sputum:    MRSA PCR: pending  Height: 5\' 4"  (162.6 cm) Weight: 132 lb 0.9 oz (59.9 kg) IBW/kg (Calculated) : 54.7  Temp (24hrs), Avg:98.3 F (36.8 C), Min:97.7 F (36.5 C), Max:99.1 F (37.3 C)  Recent Labs  Lab 05/31/17 1346 06/01/17 0509 06/02/17 0514  WBC 8.2  --   --   CREATININE 0.76 0.84 0.75    Estimated Creatinine Clearance: 46.8 mL/min (by C-G formula based on SCr of 0.75 mg/dL).    Allergies  Allergen Reactions  . Diphenhydramine Hcl Hives  . Livalo [Pitavastatin Calcium] Other (See Comments)    Pt doesn't remember reaction  . Sulfa Antibiotics Other (See Comments)    Pt doesn't remember reaction  . Zocor [Simvastatin] Other (See Comments)    Pt doesn't remember reaction Leg cramping   . Actonel [Risedronate Sodium] Other (See Comments)    Esophagitis Esophagitis   . Fosamax [Alendronate Sodium] Other (See Comments)    Esophagitis  Esophagitis   . Pravastatin Other (See Comments)    Extreme leg cramping. Extreme leg cramping.   Thank you for allowing pharmacy to be a part of this patient's care.  Hart Robinsons A 06/02/2017 4:59 PM

## 2017-06-02 NOTE — Progress Notes (Signed)
Radiologist advised of INR, ok to proceed with Thoracentesis.

## 2017-06-02 NOTE — Progress Notes (Signed)
MD advised of low oxygen saturation in the 70's, proceeding with Thoracentesis. Pt nurse called and advised of her oxygen levels and blue nail beds. Oxygen increased via nasal cannula to 6 lpm per MD. Pt speaking in 2 word sentences. Pt tolerating thoracentesis.

## 2017-06-02 NOTE — Progress Notes (Addendum)
PROGRESS NOTE                                                                                                                                                                                                             Patient Demographics:    Patricia Villa, is a 84 y.o. female, DOB - 1932-08-16, NUU:[id number]  Admit date - 05/31/2017   Admitting Physician [name], MD  Outpatient Primary MD for the patient is [name], MD  LOS - 2  Outpatient Specialists: Dr Luciano Cutter (cardiology)  Chief Complaint  Patient presents with  . Fatigue  . Hand Problem       Brief Narrative   84 year old female with severe mitral regurgitation with flail anterior leaflet, moderate to severe TR with pulmonary hypertension. She had TEE which showed ruptured cord and was then sent to [facility] where she had mitral valve repair using mitral clips. Last echo in December showed normal EF of 60% with mild MR and severe TR with pulmonary artery pressure of 60 mmHg. She was seen by her cardiologist in December 2018 and was noted to be increasingly frail with significant failure to thrive and dyspnea on minimal exertion. He discussed options of right heart catheterization but patient's son refused and inclined to worse comfort measures. As per signs patient has been progressively declining in the past few months. She also has progressive dementia with cognitive deficit. She was admitted to the hospital on 1/26 with acute exacerbation of CHF with dyspnea and hypoxia. She was found to have significant left-sided pleural effusion.  Patient ordered for left thoracentesis for today. On my exam this morning she was short of breath and in some distress. She was also found to have significant ascites on exam. Patient sent to radiology for thoracentesis where she was found to be dissecting in the 70s to 80s on nasal cannula. She had about 1.3 L of  serosanguineous fluid drained. Follow-up chest x-ray showed small apical pneumothorax. After arriving to the floor she had cool and bluish fingers with difficulty accessing her pulse oximetry. An ABG done showed pH of 7.46, PCO2 of 35 and PO2 of 81%. Patient transferred to stepdown unit on 10 L O2 via nasal cannula and was subsequently transitioned to nonrebreather for progressive shortness of breath.    Subjective:   Patient on nonrebreather in  the ICU and frequently coughing, tachypneic.   Assessment  & Plan :    Principal Problem: Acute on chronic systolic and diastolic CHF (HCC) Acute pulmonary edema No prior history of pleural effusion or ascites. Likely acute decompensation of CHF. Patient had left thoracentesis with 1.3 L serosanguineous fluid removed (labs sent for cell count, culture, LDH, albumin and cytology). Had a small left apical pneumothorax postprocedure which had resolved on subsequent chest x-ray 4 hours later but shows increased pulmonary infiltrate bilaterally with increased pulmonary edema and increased left pleural effusion. -I started patient on IV Lasix drip. Foley placed and for strict I/O monitoring. 2-D echo ordered. -Order when necessary IV morphine. I have also added empiric IV Unasyn for possible aspiration. -Cardiology consult in the morning. Patient will need repeat thoracentesis tomorrow along with therapeutic paracentesis is stable.  -Overall prognosis is guarded. I have discussed in detail with both her sons at bedside and explained that she has acute decompensated CHF with recurrent pleural effusion. They understand that she has been declining rapidly over last few months. They do not want any aggressive intervention or procedures. Elderly with escalating to BiPAP if she has further respiratory compromise and agree with diuresing with IV Lasix and thoracentesis/paracentesis as needed. -Given her rapid atrial completion of pleural fluid suspect she may again  have rupture of her mitral valve.   Active Problems: Abdominal ascites Secondary to biventricular failure. Paracentesis tomorrow if tolerated.    Atrial fibrillation, persistent (HCC)  holding Coumadin for thoracentesis.    Secondary pulmonary arterial hypertension (HCC)  follows with Dr. Delton Coombes. And did that patient would not respond to vasodilators.   Elevated troponin Ischemic versus secondary to demand ischemia. Not stable for acute intervention at present. Family also not interested in pursuing cardiac cath.     Code Status :DO NOT RESUSCITATE  Family Communication  :sons at bedside  Disposition Plan  :  Pending hospital course. Prognosis is severely guarded.  Barriers For Discharge :  Active symptoms  Consults  :  Cardiology (pending)  Procedures  :   Left thoracentesis Pending 2-D echo and abdominal paracentesis  DVT Prophylaxis  :  None (therapeutic INR) Lab Results  Component Value Date   PLT 183 05/31/2017    Antibiotics  :    Anti-infectives (From admission, onward)   Start     Dose/Rate Route Frequency Ordered Stop   06/02/17 1800  ampicillin-sulbactam (UNASYN) 1.5 g in sodium chloride 0.9 % 50 mL IVPB     1.5 g 100 mL/hr over 30 Minutes Intravenous Every 6 hours 06/02/17 1659          Objective:   Vitals:   06/02/17 1500 06/02/17 1600 06/02/17 1616 06/02/17 1700  BP: (!) 123/92 (!) 111/92  121/68  Pulse:    89  Resp: (!) 26 (!) 29  (!) 22  Temp:   98.5 F (36.9 C)   TempSrc:   Axillary   SpO2:    97%  Weight:      Height:        Wt Readings from Last 3 Encounters:  06/02/17 59.9 kg (132 lb 0.9 oz)  05/15/17 63 kg (139 lb)  04/23/17 63.5 kg (140 lb)     Intake/Output Summary (Last 24 hours) at 06/02/2017 1802 Last data filed at 06/02/2017 0800 Gross per 24 hour  Intake 120 ml  Output 300 ml  Net -180 ml     Physical Exam  Gen:  In distress, tachypneic, on nonrebreather HEENT:  On nonrebreather, , JVD +, Chest: Diminished  bibasilar breath sounds with coarse crackles CVS:  S1 and S2 irregular, systolic murmur 3/6 GI: soft, NT, ND, BS+ Musculoskeletal: warm,  trace edema     Data Review:    CBC Recent Labs  Lab 05/31/17 1346  WBC 8.2  HGB 12.9  HCT 40.6  PLT 183  MCV 107.7*  MCH 34.2*  MCHC 31.8  RDW 15.8*  LYMPHSABS 1.1  MONOABS 1.1*  EOSABS 0.1  BASOSABS 0.0    Chemistries  Recent Labs  Lab 05/31/17 1346 06/01/17 0509 06/02/17 0514  NA 139 139 135  K 3.8 3.8 3.5  CL 100* 99* 95*  CO2 26 28 26   GLUCOSE 91 93 87  BUN 26* 25* 25*  CREATININE 0.76 0.84 0.75  CALCIUM 8.8* 9.2 8.9  AST 45*  --   --   ALT 22  --   --   ALKPHOS 96  --   --   BILITOT 3.4*  --   --    ------------------------------------------------------------------------------------------------------------------ No results for input(s): CHOL, HDL, LDLCALC, TRIG, CHOLHDL, LDLDIRECT in the last 72 hours.  No results found for: HGBA1C ------------------------------------------------------------------------------------------------------------------ No results for input(s): TSH, T4TOTAL, T3FREE, THYROIDAB in the last 72 hours.  Invalid input(s): FREET3 ------------------------------------------------------------------------------------------------------------------ No results for input(s): VITAMINB12, FOLATE, FERRITIN, TIBC, IRON, RETICCTPCT in the last 72 hours.  Coagulation profile Recent Labs  Lab 06/01/17 1339 06/02/17 0514  INR 2.65 2.97    No results for input(s): DDIMER in the last 72 hours.  Cardiac Enzymes Recent Labs  Lab 05/31/17 1844 05/31/17 2254 06/01/17 0509  TROPONINI 0.09* 0.10* 0.09*   ------------------------------------------------------------------------------------------------------------------    Component Value Date/Time   BNP 1,714.0 (H) 05/31/2017 1346    Inpatient Medications  Scheduled Meds: . acetaminophen  1,000 mg Oral Q6H  . aspirin EC  81 mg Oral Daily  .  cholecalciferol  2,000 Units Oral Daily  . donepezil  10 mg Oral Daily  . DULoxetine  30 mg Oral Daily  . folic acid  1 mg Oral Daily  . iron polysaccharides  150 mg Oral Daily  . memantine  10 mg Oral BID  . metoprolol succinate  12.5 mg Oral Daily  . mupirocin ointment   Topical BID  . polyethylene glycol  17 g Oral BID  . sodium chloride flush  3 mL Intravenous Q12H   Continuous Infusions: . sodium chloride    . ampicillin-sulbactam (UNASYN) IV Stopped (06/02/17 1828)  . furosemide (LASIX) infusion 10 mg/hr (06/02/17 1750)   PRN Meds:.sodium chloride, acetaminophen, morphine injection, ondansetron (ZOFRAN) IV, sodium chloride, sodium chloride flush  Micro Results Recent Results (from the past 240 hour(s))  Culture, body fluid-bottle     Status: None (Preliminary result)   Collection Time: 06/02/17 12:36 PM  Result Value Ref Range Status   Specimen Description PLEURAL  Final   Special Requests BOTTLES DRAWN AEROBIC AND ANAEROBIC 10 CC EACH  Final   Culture PENDING  Incomplete   Report Status PENDING  Incomplete  Gram stain     Status: None   Collection Time: 06/02/17 12:36 PM  Result Value Ref Range Status   Specimen Description PLEURAL  Final   Special Requests NONE  Final   Gram Stain   Final    CYTOSPIN SMEAR NO ORGANISMS SEEN WBC PRESENT,BOTH PMN AND MONONUCLEAR    Report Status 06/02/2017 FINAL  Final    Radiology Reports Dg Chest 1 View  Result Date: 06/02/2017 CLINICAL DATA:  LEFT  pleural effusion post thoracentesis EXAM: CHEST 1 VIEW COMPARISON:  05/31/2017 FINDINGS: Enlargement of cardiac silhouette with pulmonary vascular congestion. Atherosclerotic calcification aorta. Enlarged central pulmonary arteries. Improved pulmonary infiltrates question pulmonary edema. Markedly decreased LEFT pleural effusion and basilar atelectasis post thoracentesis Tiny LEFT apex pneumothorax. Bones demineralized. IMPRESSION: Tiny LEFT apex pneumothorax following thoracentesis.  Significantly decreased LEFT pleural effusion and basilar atelectasis. Improved pulmonary edema. Findings called to Dr. Gonzella Lex on 06/02/2017 at 1220 hours. Electronically Signed   By: Ulyses Southward M.D.   On: 06/02/2017 12:25   US Abdomen Complete  Result Date: 06/02/2017 CLINICAL DATA:  Ascites EXAM: ABDOMEN ULTRASOUND COMPLETE COMPARISON:  08/26/2016 FINDINGS: Gallbladder: Well distended without stones or sonographic Murphy sign. Upper normal wall thickness, nonspecific in the setting of ascites. Common bile duct: Diameter: Nonvisualized question due to ascites and bowel Liver: Nodular appearing hepatic margins suggesting cirrhosis. No definite hepatic mass or intrahepatic biliary dilatation. Portal vein is patent on color Doppler imaging with normal direction of blood flow towards the liver. IVC: Normal appearance Pancreas: Normal appearance Spleen: Normal appearance, 5.7 cm length Right Kidney: Length: 10.3 cm.  Normal appearance Left Kidney: Length: 11.0 cm.  Normal appearance Abdominal aorta: Visualized portions normal appearance with distal aorta obscured by bowel gas Other findings: Large volume ascites. IMPRESSION: Cirrhotic liver with significant ascites. Nonvisualization of CBD but no intrahepatic biliary dilatation is identified. Electronically Signed   By: Ulyses Southward M.D.   On: 06/02/2017 15:51   Dg Chest Port 1 View  Result Date: 06/02/2017 CLINICAL DATA:  Small LEFT apex pneumothorax following LEFT thoracentesis earlier today, followup, increased RIGHT-side chest pain EXAM: PORTABLE CHEST 1 VIEW COMPARISON:  Portable exam in expiration at 1610 hours compared to 1157 hours FINDINGS: Tips of lung apices excluded, patient moving during study. Enlargement of cardiac silhouette with pulmonary vascular congestion. Increased BILATERAL pulmonary infiltrates since earlier exam. Small LEFT pleural effusion appears increased since earlier exam. No definite pneumothorax identified the tip of the LEFT lung  apex is excluded. IMPRESSION: Increased pulmonary infiltrates bilaterally likely reflecting increased pulmonary edema. Increased LEFT pleural effusion. No gross pneumothorax is identified though the tips of the lung apices are incompletely visualized due to patient movement. Findings called to Dr.  Gonzella Lex on 06/02/2017 at 1635 hours. Electronically Signed   By: Ulyses Southward M.D.   On: 06/02/2017 16:37   Dg Chest Port 1 View  Result Date: 05/31/2017 CLINICAL DATA:  Shortness of breath, fatigue, RIGHT hand swelling and redness for 1 week, history atrial fibrillation, hypertension EXAM: PORTABLE CHEST 1 VIEW COMPARISON:  07/23/2013, 02/17/2017 FINDINGS: Enlargement of cardiac silhouette with pulmonary vascular congestion. Atherosclerotic calcification aorta. Diffuse BILATERAL pulmonary infiltrates consistent with pulmonary edema and CHF, new. Large LEFT pleural effusion with significant atelectasis of the mid to lower LEFT lung, significantly increased since 02/17/2017. No definite RIGHT pleural effusion. No pneumothorax. Bones unremarkable. IMPRESSION: CHF with significant interval increase in LEFT pleural effusion and LEFT lung atelectasis since 02/17/2017. Electronically Signed   By: Ulyses Southward M.D.   On: 05/31/2017 14:37   US Thoracentesis Asp Pleural Space W/img Guide  Result Date: 06/02/2017 INDICATION: Large LEFT pleural effusion EXAM: ULTRASOUND GUIDED THERAPEUTIC LEFT THORACENTESIS MEDICATIONS: None. COMPLICATIONS: None immediate. PROCEDURE: Procedure, benefits, and risks of procedure were discussed with patient. Written informed consent for procedure was obtained. Time out protocol followed. Pleural effusion localized by ultrasound at the posterior LEFT hemithorax. Skin prepped and draped in usual sterile fashion. Skin and soft tissues anesthetized with 8 mL of  1% lidocaine. 8 French thoracentesis catheter placed into the LEFT pleural space. 1.3 L of serosanguineous fluid aspirated by syringe pump.  Procedure tolerated well by patient with expected coughing during and after the procedure. Small LEFT apex pneumothorax noted on prost-procedural chest radiograph FINDINGS: As above IMPRESSION: Successful ultrasound guided LEFT thoracentesis yielding 1.3 L of pleural fluid. Electronically Signed   By: Ulyses Southward M.D.   On: 06/02/2017 12:24    Time Spent in minutes  35   Kaidyn Javid M.D on 06/02/2017 at 6:02 PM  Between 7am to 7pm - Pager - [phone]  After 7pm go to [email or web address] - password [facility]  [facility] -  Office  [phone]

## 2017-06-03 ENCOUNTER — Encounter (HOSPITAL_COMMUNITY): Payer: Self-pay | Admitting: Primary Care

## 2017-06-03 ENCOUNTER — Inpatient Hospital Stay (HOSPITAL_COMMUNITY): Payer: Medicare HMO

## 2017-06-03 DIAGNOSIS — I5033 Acute on chronic diastolic (congestive) heart failure: Secondary | ICD-10-CM

## 2017-06-03 DIAGNOSIS — J9601 Acute respiratory failure with hypoxia: Secondary | ICD-10-CM

## 2017-06-03 DIAGNOSIS — Z7189 Other specified counseling: Secondary | ICD-10-CM

## 2017-06-03 DIAGNOSIS — K761 Chronic passive congestion of liver: Secondary | ICD-10-CM

## 2017-06-03 DIAGNOSIS — I059 Rheumatic mitral valve disease, unspecified: Secondary | ICD-10-CM

## 2017-06-03 DIAGNOSIS — J90 Pleural effusion, not elsewhere classified: Secondary | ICD-10-CM

## 2017-06-03 DIAGNOSIS — Z515 Encounter for palliative care: Secondary | ICD-10-CM

## 2017-06-03 LAB — BASIC METABOLIC PANEL
Anion gap: 12 (ref 5–15)
BUN: 26 mg/dL — AB (ref 6–20)
CHLORIDE: 98 mmol/L — AB (ref 101–111)
CO2: 25 mmol/L (ref 22–32)
Calcium: 8.8 mg/dL — ABNORMAL LOW (ref 8.9–10.3)
Creatinine, Ser: 0.85 mg/dL (ref 0.44–1.00)
GFR calc Af Amer: >60 mL/min (ref >60)
GFR calc non Af Amer: >60 mL/min (ref >60)
Glucose, Bld: 92 mg/dL (ref 65–99)
POTASSIUM: 3.6 mmol/L (ref 3.5–5.1)
SODIUM: 135 mmol/L (ref 135–145)

## 2017-06-03 LAB — CBC
HCT: 40.5 % (ref 36.0–46.0)
Hemoglobin: 13.1 g/dL (ref 12.0–15.0)
MCH: 34.3 pg — ABNORMAL HIGH (ref 26.0–34.0)
MCHC: 32.3 g/dL (ref 30.0–36.0)
MCV: 106 fL — ABNORMAL HIGH (ref 78.0–100.0)
Platelets: 174 10*3/uL (ref 150–400)
RBC: 3.82 MIL/uL — ABNORMAL LOW (ref 3.87–5.11)
RDW: 15.7 % — ABNORMAL HIGH (ref 11.5–15.5)
WBC: 11.4 10*3/uL — ABNORMAL HIGH (ref 4.0–10.5)

## 2017-06-03 LAB — PROTIME-INR
INR: 3.02
PROTHROMBIN TIME: 31.1 s — AB (ref 11.4–15.2)

## 2017-06-03 MED ORDER — CALAMINE EX LOTN
TOPICAL_LOTION | CUTANEOUS | Status: DC | PRN
  Administered 2017-06-03: 13:00:00 via TOPICAL
  Filled 2017-06-03: qty 177

## 2017-06-03 MED ORDER — FUROSEMIDE 10 MG/ML IJ SOLN
10.0000 mg/h | INTRAVENOUS | Status: DC
  Administered 2017-06-03 (×2): 10 mg/h via INTRAVENOUS
  Filled 2017-06-03 (×2): qty 25

## 2017-06-03 NOTE — Consult Note (Signed)
Cardiology Consult    Patient ID: Patricia Villa; [id number]; December 21, 1932   Admit date: 05/31/2017 Date of Consult: 06/03/2017  Primary Care Provider: Chipper Herb, MD Primary Cardiologist: Dr. Percival Spanish  Patient Profile    Patricia Villa is a 84 y.o. female with past medical history of severe MR (s/p mitral clip placement in 03/2016 at [facility]), CAD (cath in 12/2015 showing 50% mid-LAD stenosis), chronic diastolic CHF, persistent atrial fibrillation (on Coumadin), chronic respiratory failure (on 2L [place] at baseline) and dementia who is being seen today for the evaluation of CHF at the request of Dr. Clementeen Graham.   History of Present Illness    Patricia Villa was last examined by Dr. Percival Spanish in 04/2017 and was noted to have worsening weakness and fragility. Recent echo in 03/2017 had shown a preserved EF of 60-65%, no regional WMA, and mild MR with a PA peak pressure of  60 mm Hg (S). Options regarding a repeat right heart catheterization were reviewed with the patient and her family with them wishing to avoid further invasive measures.   She presented to [facility] ED on 05/31/2017 for worsening dyspnea, having found to be hypoxic when oxygen saturations were checked at home. In talking with the patient and her two sons at the bedside, she has been declining over the past several months as her PO intake has decreased and she is less active. She was experiencing significant dyspnea on exertion at home with minimal activity along with worsening orthopnea and abdominal distension. No complaints of chest pain, palpitations, or presyncope. They have been unable to track her weights at home but this was at 140 lbs at the time of her office visit on 04/23/2017.   Initial labs show WBC of 8.2, Hgb 12.9, platelets 183, Na+ 139, K+ 3.8, and creatinine 0.76. BNP 1714. Initial and cyclic troponin values at 0.08, 0.09, 0.10, and 0.09. EKG shows atrial fibrillation, HR 81. CXR showing CHF with significant increase  in left pleural effusion and left lung atelectasis.   She was admitted for further management and started on IV Lasix 40mg  BID. She underwent a left thoracentesis on 05/23/2017 with -1.3L of red colored pleural fluid removed. Following the procedure, she had worsening dyspnea and desaturations, requiring BiPAP initially and later transitioned to 10L [place]. She did have a small apical pneumothorax following the procedure but this was resolved on later imaging but showed a persistent pleural effusion. Was also started on Unasyn for coverage of aspiration PNA. She was switched to a Lasix drip with weight at 133 lbs this AM (have been variable in the system since admission). I&O's inaccurate due to episodes of incontinence.   Her breathing is slightly improved this morning and she is scheduled for a therapeutic paracentesis later today as recent abdominal US showed significant ascites.    Past Medical History:  Diagnosis Date  . Alopecia   . Anxiety   . Arthritis   . Atrial fibrillation, persistent ([place]) 10/30/2015  . Cataract   . DDD (degenerative disc disease), cervical   . Diverticulosis of colon   . Hyperlipidemia   . Hypertension   . Hypertrophic cardiomyopathy ([place])    by history but not identified on the most recent echo  . Meningioma (HCC)    cerebellopontine angle   . Mitral insufficiency    Severe with anterior flail leaflet.    . Osteoporosis   . Postmenopausal HRT (hormone replacement therapy)   . Pulmonary hypertension ([place])   . Stroke  due to embolism of left cerebellar artery (HCC)    Silent - noted on MRI  . Vertebrobasilar insufficiency   . Vocal cord paralysis, unilateral complete     Past Surgical History:  Procedure Laterality Date  . ABDOMINAL HYSTERECTOMY    . CARDIAC CATHETERIZATION N/A 01/02/2016   Procedure: Right/Left Heart Cath and Coronary Angiography;  Surgeon: Sherren Mocha, MD;  Location: [place] CV LAB;  Service: Cardiovascular;  Laterality: N/A;  .  CATARACT EXTRACTION, BILATERAL    . EYE SURGERY    . TEE WITHOUT CARDIOVERSION N/A 12/12/2015   Procedure: TRANSESOPHAGEAL ECHOCARDIOGRAM (TEE);  Surgeon: Lelon Perla, MD;  Location: [facility] ENDOSCOPY;  Service: Cardiovascular;  Laterality: N/A;  . TONSILLECTOMY    . VEIN SURGERY       Home Medications:  Prior to Admission medications   Medication Sig Start Date End Date Taking? Authorizing Provider  acetaminophen (TYLENOL) 500 MG tablet Take 1,000 mg by mouth every 6 (six) hours.    Yes [provider]  Cholecalciferol (VITAMIN D3) 2000 UNITS TABS Take 2,000 Units by mouth daily.   Yes [provider]  donepezil (ARICEPT) 10 MG tablet Take 1 tablet (10 mg total) by mouth at bedtime. Patient taking differently: Take 10 mg by mouth daily.  06/18/16  Yes Garvin Fila, MD  DULoxetine (CYMBALTA) 30 MG capsule TAKE 1 CAPSULE BY MOUTH ONCE DAILY 03/19/17  Yes Chipper Herb, MD  FeFum-FePoly-FA-B Cmp-C-Biot (INTEGRA PLUS) CAPS Take 1 capsule by mouth daily. 12/27/16  Yes Chipper Herb, MD  folic acid (FOLVITE) 1 MG tablet Take 1 mg by mouth daily.   Yes [provider]  furosemide (LASIX) 40 MG tablet TAKE 1 TABLET BY MOUTH TWICE DAILY AS DIRECTED 02/07/17  Yes Chipper Herb, MD  memantine (NAMENDA) 10 MG tablet Take 10 mg by mouth 2 (two) times daily. 05/21/17  Yes [provider]  metoprolol succinate (TOPROL-XL) 25 MG 24 hr tablet TAKE ONE-HALF TABLET BY MOUTH ONCE DAILY 12/20/16  Yes Chipper Herb, MD  mupirocin ointment (BACTROBAN) 2 % Apply topically 2 (two) times daily. 04/03/17  Yes Eustaquio Maize, MD  Triamcinolone Acetonide (TRIAMCINOLONE 0.1 % CREAM : EUCERIN) CREA Apply 1 application topically 2 (two) times daily. Patient taking differently: Apply 1 application topically 2 (two) times daily as needed for rash, itching or irritation.  10/03/16  Yes Chipper Herb, MD  warfarin (COUMADIN) 5 MG tablet Take 2.5 mg by mouth daily at 6 PM.   Yes  [provider]  Elastic Bandages & Supports (V-2 HIGH COMPRESSION HOSE) [place] Wear daily 01/23/17   Hassell Done, Mary-Margaret, FNP  memantine [facility] TITRATION PAK) tablet pack 5 mg/day for =1 week; 5 mg twice daily for =1 week; 15 mg/day given in 5 mg and 10 mg separated doses for =1 week; then 10 mg twice daily Patient not taking: Reported on 05/31/2017 04/10/17   Garvin Fila, MD  warfarin (COUMADIN) 5 MG tablet TAKE 1 TABLET BY MOUTH ONCE DAILY Patient not taking: Reported on 05/31/2017 01/01/17   Chipper Herb, MD    Inpatient Medications: Scheduled Meds: . acetaminophen  1,000 mg Oral Q6H  . aspirin EC  81 mg Oral Daily  . cholecalciferol  2,000 Units Oral Daily  . donepezil  10 mg Oral Daily  . DULoxetine  30 mg Oral Daily  . folic acid  1 mg Oral Daily  . iron polysaccharides  150 mg Oral Daily  . memantine  10 mg Oral BID  . metoprolol succinate  12.5 mg Oral Daily  . mupirocin ointment   Topical BID  . polyethylene glycol  17 g Oral BID  . sodium chloride flush  3 mL Intravenous Q12H   Continuous Infusions: . sodium chloride    . ampicillin-sulbactam (UNASYN) IV Stopped (06/03/17 6433)   PRN Meds: sodium chloride, acetaminophen, morphine injection, ondansetron (ZOFRAN) IV, sodium chloride, sodium chloride flush  Allergies:    Allergies  Allergen Reactions  . Diphenhydramine Hcl Hives  . Livalo [Pitavastatin Calcium] Other (See Comments)    Pt doesn't remember reaction  . Sulfa Antibiotics Other (See Comments)    Pt doesn't remember reaction  . Zocor [Simvastatin] Other (See Comments)    Pt doesn't remember reaction Leg cramping   . Actonel [Risedronate Sodium] Other (See Comments)    Esophagitis Esophagitis   . Fosamax [Alendronate Sodium] Other (See Comments)    Esophagitis  Esophagitis   . Pravastatin Other (See Comments)    Extreme leg cramping. Extreme leg cramping.    Social History:   Social History   Socioeconomic History  . Marital  status: Married    Spouse name: Not on file  . Number of children: Not on file  . Years of education: Not on file  . Highest education level: Not on file  Social Needs  . Financial resource strain: Not on file  . Food insecurity - worry: Not on file  . Food insecurity - inability: Not on file  . Transportation needs - medical: Not on file  . Transportation needs - non-medical: Not on file  Occupational History  . Not on file  Tobacco Use  . Smoking status: Never Smoker  . Smokeless tobacco: Never Used  . Tobacco comment: Rare previously  Substance and Sexual Activity  . Alcohol use: No    Alcohol/week: 0.0 oz  . Drug use: No  . Sexual activity: No  Other Topics Concern  . Not on file  Social History Narrative  . Not on file     Family History:    Family History  Problem Relation Age of Onset  . Other Brother        IHSS  . COPD Brother   . Diabetes Brother   . Heart disease Brother   . Hyperlipidemia Brother   . Hypertension Brother   . Asthma Mother   . Other Sister        IHSS  . Other Sister        IHSS  . Cancer Cousin        thyroid      Review of Systems    General:  No chills, fever, night sweats or weight changes.  Cardiovascular:  No chest pain, palpitations, paroxysmal nocturnal dyspnea. Positive for orthopnea and dyspnea on exertion.  Dermatological: No rash, lesions/masses Respiratory: No cough, dyspnea Urologic: No hematuria, dysuria Abdominal:   No nausea, vomiting, diarrhea, bright red blood per rectum, melena, or hematemesis. Positive for abdominal distension.  Neurologic:  No visual changes, wkns, changes in mental status. All other systems reviewed and are otherwise negative except as noted above.  Physical Exam/Data    Vitals:   06/03/17 0300 06/03/17 0400 06/03/17 0500 06/03/17 0714  BP: (!) 104/59 115/61    Pulse: 76 79    Resp: 17 16    Temp:  (!) 97.1 F (36.2 C)  98.5 F (36.9 C)  TempSrc:  Axillary  Axillary  SpO2: 98%  97%  Weight:   133 lb 2.5 oz (60.4 kg)   Height:        Intake/Output Summary (Last 24 hours) at 06/03/2017 0722 Last data filed at 06/03/2017 0500 Gross per 24 hour  Intake 601.67 ml  Output 650 ml  Net -48.33 ml   Filed Weights   06/02/17 0500 06/02/17 1431 06/03/17 0500  Weight: 152 lb 1.9 oz (69 kg) 132 lb 0.9 oz (59.9 kg) 133 lb 2.5 oz (60.4 kg)   Body mass index is 22.86 kg/m.   General: Pleasant, elderly Caucasian female appearing in NAD.  Psych: Normal affect. Neuro: Alert and oriented X 3. Moves all extremities spontaneously. HEENT: Normal  Neck: Supple without bruits. JVD at 10cm. Lungs:  Resp regular and unlabored, decreased breath sounds along left base. Heart: Irregularly irregular,  no s3, s4, 2/6 SEM along Apex. Abdomen: Soft, non-tender, BS + x 4. Appears distended.  Extremities: No clubbing, cyanosis or edema. DP/PT/Radials 2+ and equal bilaterally.   EKG:  The EKG was personally reviewed and demonstrates: Atrial fibrillation, HR 81.  Telemetry:  Telemetry was personally reviewed and demonstrates: Atrial fibrillation, HR in 70's to 80's with occasional PVC's.    Labs/Studies     Relevant CV Studies:  Cardiac Catheterization: 12/2015  Mid LAD lesion, 50 %stenosed.  Hemodynamic findings consistent with mitral valve regurgitation.  LV end diastolic pressure is normal.   1. Nonobstructive coronary artery disease with moderate stenosis of the mid LAD beyond the second diagonal branch, otherwise angiographically normal coronary arteries 2. Hemodynamic characteristics of severe mitral regurgitation with a large V wave and moderate pulmonary hypertension. 3. Low cardiac output as measured by Fick technique with the pulmonary artery oxygen saturation of 47%, cardiac output of 3.0, and cardiac index of 1.8  The patient will continue with evaluation for mitral valve repair.   Echocardiogram: 03/2017 Study Conclusions  - Valve surgery. Mitraclip. -  Left ventricle: The cavity size was normal. Systolic function was   normal. The estimated ejection fraction was in the range of 60%   to 65%. Wall motion was normal; there were no regional wall   motion abnormalities. - Ventricular septum: The contour showed diastolic flattening. - Aortic valve: There was mild regurgitation. - Mitral valve: Calcified annulus. Moderately thickened, mitral   clip. Mild prolapse, involving the posterior leaflet. There was   mild regurgitation directed posteriorly. - Left atrium: The atrium was severely dilated. Volume/bsa, ES   (1-plane Simpson&'s, A4C): 57.1 ml/m^2. - Tricuspid valve: There was severe regurgitation. - Pulmonary arteries: Systolic pressure was moderately increased.   PA peak pressure: 60 mm Hg (S). - Pericardium, extracardiac: There was a left pleural effusion.   Laboratory Data:  Chemistry Recent Labs  Lab 06/01/17 0509 06/02/17 0514 06/03/17 0409  NA 139 135 135  K 3.8 3.5 3.6  CL 99* 95* 98*  CO2 28 26 25   GLUCOSE 93 87 92  BUN 25* 25* 26*  CREATININE 0.84 0.75 0.85  CALCIUM 9.2 8.9 8.8*  GFRNONAA >60 >60 >60  GFRAA >60 >60 >60  ANIONGAP 12 14 12     Recent Labs  Lab 05/31/17 1346  PROT 7.2  ALBUMIN 2.9*  AST 45*  ALT 22  ALKPHOS 96  BILITOT 3.4*   Hematology Recent Labs  Lab 05/31/17 1346 06/03/17 0409  WBC 8.2 11.4*  RBC 3.77* 3.82*  HGB 12.9 13.1  HCT 40.6 40.5  MCV 107.7* 106.0*  MCH 34.2* 34.3*  MCHC 31.8 32.3  RDW 15.8* 15.7*  PLT  183 174   Cardiac Enzymes Recent Labs  Lab 05/31/17 1346 05/31/17 1844 05/31/17 2254 06/01/17 0509  TROPONINI 0.08* 0.09* 0.10* 0.09*   No results for input(s): TROPIPOC in the last 168 hours.  BNP Recent Labs  Lab 05/31/17 1346  BNP 1,714.0*    DDimer No results for input(s): DDIMER in the last 168 hours.  Radiology/Studies:  Dg Chest 1 View  Result Date: 06/02/2017 CLINICAL DATA:  LEFT pleural effusion post thoracentesis EXAM: CHEST 1 VIEW COMPARISON:   05/31/2017 FINDINGS: Enlargement of cardiac silhouette with pulmonary vascular congestion. Atherosclerotic calcification aorta. Enlarged central pulmonary arteries. Improved pulmonary infiltrates question pulmonary edema. Markedly decreased LEFT pleural effusion and basilar atelectasis post thoracentesis Tiny LEFT apex pneumothorax. Bones demineralized. IMPRESSION: Tiny LEFT apex pneumothorax following thoracentesis. Significantly decreased LEFT pleural effusion and basilar atelectasis. Improved pulmonary edema. Findings called to Dr. Clementeen Graham on 06/02/2017 at 1220 hours. Electronically Signed   By: Lavonia Dana M.D.   On: 06/02/2017 12:25   US Abdomen Complete  Result Date: 06/02/2017 CLINICAL DATA:  Ascites EXAM: ABDOMEN ULTRASOUND COMPLETE COMPARISON:  08/26/2016 FINDINGS: Gallbladder: Well distended without stones or sonographic Murphy sign. Upper normal wall thickness, nonspecific in the setting of ascites. Common bile duct: Diameter: Nonvisualized question due to ascites and bowel Liver: Nodular appearing hepatic margins suggesting cirrhosis. No definite hepatic mass or intrahepatic biliary dilatation. Portal vein is patent on color Doppler imaging with normal direction of blood flow towards the liver. IVC: Normal appearance Pancreas: Normal appearance Spleen: Normal appearance, 5.7 cm length Right Kidney: Length: 10.3 cm.  Normal appearance Left Kidney: Length: 11.0 cm.  Normal appearance Abdominal aorta: Visualized portions normal appearance with distal aorta obscured by bowel gas Other findings: Large volume ascites. IMPRESSION: Cirrhotic liver with significant ascites. Nonvisualization of CBD but no intrahepatic biliary dilatation is identified. Electronically Signed   By: Lavonia Dana M.D.   On: 06/02/2017 15:51   Dg Chest Port 1 View  Result Date: 06/02/2017 CLINICAL DATA:  Small LEFT apex pneumothorax following LEFT thoracentesis earlier today, followup, increased RIGHT-side chest pain EXAM:  PORTABLE CHEST 1 VIEW COMPARISON:  Portable exam in expiration at 1610 hours compared to 1157 hours FINDINGS: Tips of lung apices excluded, patient moving during study. Enlargement of cardiac silhouette with pulmonary vascular congestion. Increased BILATERAL pulmonary infiltrates since earlier exam. Small LEFT pleural effusion appears increased since earlier exam. No definite pneumothorax identified the tip of the LEFT lung apex is excluded. IMPRESSION: Increased pulmonary infiltrates bilaterally likely reflecting increased pulmonary edema. Increased LEFT pleural effusion. No gross pneumothorax is identified though the tips of the lung apices are incompletely visualized due to patient movement. Findings called to Dr.  Clementeen Graham on 06/02/2017 at 1635 hours. Electronically Signed   By: Lavonia Dana M.D.   On: 06/02/2017 16:37   Dg Chest Port 1 View  Result Date: 05/31/2017 CLINICAL DATA:  Shortness of breath, fatigue, RIGHT hand swelling and redness for 1 week, history atrial fibrillation, hypertension EXAM: PORTABLE CHEST 1 VIEW COMPARISON:  07/23/2013, 02/17/2017 FINDINGS: Enlargement of cardiac silhouette with pulmonary vascular congestion. Atherosclerotic calcification aorta. Diffuse BILATERAL pulmonary infiltrates consistent with pulmonary edema and CHF, new. Large LEFT pleural effusion with significant atelectasis of the mid to lower LEFT lung, significantly increased since 02/17/2017. No definite RIGHT pleural effusion. No pneumothorax. Bones unremarkable. IMPRESSION: CHF with significant interval increase in LEFT pleural effusion and LEFT lung atelectasis since 02/17/2017. Electronically Signed   By: Lavonia Dana M.D.   On: 05/31/2017 14:37  US Thoracentesis Asp Pleural Space W/img Guide  Result Date: 06/02/2017 INDICATION: Large LEFT pleural effusion EXAM: ULTRASOUND GUIDED THERAPEUTIC LEFT THORACENTESIS MEDICATIONS: None. COMPLICATIONS: None immediate. PROCEDURE: Procedure, benefits, and risks of  procedure were discussed with patient. Written informed consent for procedure was obtained. Time out protocol followed. Pleural effusion localized by ultrasound at the posterior LEFT hemithorax. Skin prepped and draped in usual sterile fashion. Skin and soft tissues anesthetized with 8 mL of 1% lidocaine. 8 French thoracentesis catheter placed into the LEFT pleural space. 1.3 L of serosanguineous fluid aspirated by syringe pump. Procedure tolerated well by patient with expected coughing during and after the procedure. Small LEFT apex pneumothorax noted on prost-procedural chest radiograph FINDINGS: As above IMPRESSION: Successful ultrasound guided LEFT thoracentesis yielding 1.3 L of pleural fluid. Electronically Signed   By: Lavonia Dana M.D.   On: 06/02/2017 12:24     Assessment & Plan    1. Acute on Chronic Diastolic CHF - presented with worsening dyspnea on exertion, orthopnea, and abdominal distention over the past several weeks. BNP 1714. CXR showing CHF with significant increase in left pleural effusion and left lung atelectasis.  - she was started on IV Lasix 40mg  BID at the time of admission and underwent a left thoracentesis on 05/23/2017 with -1.3L of red colored pleural fluid removed. Initial CXR after the procedure showed a small apical pneumothorax but had resolved on later imaging but showed a recurrent left pleural effusion.  - currently on IV Lasix drip with weight at 133 lbs this AM (have been variable in the system since admission). Would obtain strict I&O's. Repeat BMET in AM. Plan is for a therapeutic paracentesis later today as recent abdominal US showed significant ascites. Would consider a Palliative Care consult for goals of care discussion in the setting of her worsening CHF, dementia, worsening fragility, and likely aspiration PNA.   2. Chronic Hypoxic Respiratory Failure/ Pulmonary HTN - on 2L [place] at baseline, currently on 10L with saturations in the 90's.  - she has been  started on Unasyn for coverage of aspiration PNA.   3. Elevated Troponin/ CAD - cath in 12/2015 showed 50% mid-LAD stenosis but no obstructive CAD. She denies any recent chest pain. Cyclic enzymes have been flat at 0.08, 0.09, 0.10, and 0.09. EKG with no acute ischemic changes. No plans for further ischemic evaluation at this time.   4. Mitral Regurgitation - s/p mitral clip placement in 03/2016 at [facility]. - echo in 03/2017 showed mild MR. Repeat limited echo is pending to assess mitral valve function.   5. Persistent Atrial Fibrillation - HR remains well-controlled in the 70's to 80's. Continue Toprol-XL for rate-control. - on Coumadin PTA for anticoagulation. Currently held while procedures are being performed. Would resume once no further invasive procedures are planned.   6. Dementia - she is A&Ox3 at the time of this encounter. Family is at the bedside.   For questions or updates, please contact [place] Please consult [email or web address] for contact info under Cardiology/STEMI.  Signed, Erma Heritage, PA-C 06/03/2017, 7:22 AM Pager: [phone]   Attending note:  Patient seen and examined.  I reviewed records and discussed case with Ms. Ahmed Prima PA-C.  Patricia Villa presents with gradually declining course over a period of months.  Patient's son present today confirms this.  She has been more short of breath with decreased appetite and failure to thrive overall despite regular medical therapy.  She presents now with hypoxic respiratory failure associated with pulmonary edema and  large left pleural effusion.  Now status post thoracentesis with fairly quick recurrence and persistent oxygen requirement.  She also has evidence of right-sided heart failure with known pulmonary hypertension and tricuspid regurgitation.  Echocardiogram from November 2018 showed LVEF 60-65% with mild mitral regurgitation status post clip repair, severe left atrial enlargement, and severe tricuspid  regurgitation with PASP 60 mmHg.  Short of breath at rest. Foley catheter is in place, she is now on a Lasix drip, intake and output are not recorded completely at this point.  Weight is relatively stable overall, up 1 pound.  Lungs exhibit diffuse crackles, decreased breath sounds up to the mid left lung zone.  No wheezing.  Cardiac exam with a regular rhythm, 2/6 apical systolic murmur.  Abdomen protuberant.  Lab work shows BUN 26, creatinine 0.85, hemoglobin 13.1, INR 3.0.  Follow-up chest x-ray shows increasing left pleural effusion as well as diffuse pulmonary edema.  I personally reviewed her tracing from 06/02/2017 which showed rate controlled atrial fibrillation with low voltage and incomplete right bundle branch block.  Acute hypoxic respiratory failure with recurrent left pleural effusion and pulmonary edema, also complicated by right heart failure in the setting of pulmonary hypertension and severe tricuspid regurgitation.  Patient is status post placement of mitral clip for treatment of severe mitral regurgitation in November 2017.  Last echocardiogram showed only mild mitral regurgitation as of November 2018, follow-up study pending.  Patient is DNR at this time and per discussion does not want to pursue any further invasive cardiac assessment.  I discussed this with the patient's son today as well.  Agree with transition to Lasix infusion.  Consider Palliative Care consultation.  Satira Sark, M.D., F.A.C.C.

## 2017-06-03 NOTE — Consult Note (Signed)
Consultation Note Date: 06/03/2017   Patient Name: Patricia Villa  DOB: Feb 07, 1933  MRN: [id number]  Age / Sex: 84 y.o., female  PCP: Chipper Herb, MD Referring Physician: Louellen Molder, MD  Reason for Consultation: Establishing goals of care and Psychosocial/spiritual support  HPI/Patient Profile: 84 y.o. female  with past medical history of mitral insufficiency, pulmonary hypertension, persistent atrial fibrillation,acute on chronic heart failure, arthritis, anxiety, high blood pressure and cholesterol admitted on 05/31/2017 with Acute on chronic CHF.   Clinical Assessment and Goals of Care: Patricia Villa is resting quietly in bed. She is sleeping soundly, and I do not try to wake her. She appears very frail. Present today at bedside his granddaughter, son Charles's daughter, Leotis Pain. We talk about Patricia Villa's current health condition.Leotis Pain shares that her grandmother's health has declined over the last 2 years. She shares that family is considering residential hospice, but Patricia Villa's son Abbe Amsterdam is having a difficult time with this decision.  Call to son Abbe [place].  He states that he has no questions at this time. He shares that he is unsure of hospice services. He is tearful. He agrees to a family meeting tomorrow 1/30 at 9 AM at bedside.  Call to son Juanda Crumble. He shares a story of his experience with residential hospice service.  Juanda Crumble states that his thoughts are for his mother to transfer to hospice services. Juanda Crumble states that they had called out to the hospice home in [place], found that they have no beds. We talk about what GIP status would look like for Mrs. Patricia Villa. Juanda Crumble states that e will talk with his brother.Juanda Crumble states that he was told that the only thing that e can do now is keep her comfortable. I share some statements that he can share with his brother that may ease his  concerns.  "The 'best' thing we can do is keep her comfortable", and also "We can't change what's happening".   Family meeting 1/30 at 9 AM at bedside.  Healthcare Care POA  HCPOA - sons Abbe [place] and Juanda Crumble share [place].    SUMMARY OF RECOMMENDATIONS   Family meeting 1/30 at 9 AM at bedside. Family is considering/leaning toward comfort care. At this point continue to treat the treatable but no CPR, no intubation.  Code Status/Advance Care Planning:  DNR  Symptom Management:   Per hospitalist, no additional needs at this time.  Palliative Prophylaxis:   Turn Reposition  Additional Recommendations (Limitations, Scope, Preferences):  continue to treat the treatable but no CPR, no intubation.  Psycho-social/Spiritual:   Desire for further Chaplaincy support:no  Additional Recommendations: Caregiving  Support/Resources and Education on Hospice  Prognosis:   < 2 weeks, would not be surprising based on frailty, functional status, declined over the past 2 years.  Discharge Planning: to be determined, family is considering residential hospice.      Primary Diagnoses: Present on Admission: . Atrial fibrillation, persistent ([place]) . Chronic neck pain . Essential hypertension, benign . Generalized anxiety disorder . Hypertrophic obstructive cardiomyopathy ([place]) .  Mitral valve disorder . Secondary pulmonary arterial hypertension ([place])   I have reviewed the medical record, interviewed the patient and family, and examined the patient. The following aspects are pertinent.  Past Medical History:  Diagnosis Date  . Alopecia   . Anxiety   . Arthritis   . Atrial fibrillation, persistent ([place]) 10/30/2015  . Cataract   . DDD (degenerative disc disease), cervical   . Diverticulosis of colon   . Hyperlipidemia   . Hypertension   . Hypertrophic cardiomyopathy ([place])    by history but not identified on the most recent echo  . Meningioma ([facility])    cerebellopontine angle   . Mitral  insufficiency    Severe with anterior flail leaflet.    . Osteoporosis   . Postmenopausal HRT (hormone replacement therapy)   . Pulmonary hypertension ([place])   . 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 Vertebrobasilar insufficiency   . Vocal cord paralysis, unilateral complete    Social History   Socioeconomic History  . Marital status: Married    Spouse name: None  . Number of children: None  . Years of education: None  . Highest education level: None  Social Needs  . Financial resource strain: None  . Food insecurity - worry: None  . Food insecurity - inability: None  . Transportation needs - medical: None  . Transportation needs - non-medical: None  Occupational History  . None  Tobacco Use  . Smoking status: Never Smoker  . Smokeless tobacco: Never Used  . Tobacco comment: Rare previously  Substance and Sexual Activity  . Alcohol use: No    Alcohol/week: 0.0 oz  . Drug use: No  . Sexual activity: No  Other Topics Concern  . None  Social History Narrative  . None   Family History  Problem Relation Age of Onset  . Other Brother        IHSS  . COPD Brother   . Diabetes Brother   . Heart disease Brother   . Hyperlipidemia Brother   . Hypertension Brother   . Asthma Mother   . Other Sister        IHSS  . Other Sister        IHSS  . Cancer Cousin        thyroid   Scheduled Meds: . acetaminophen  1,000 mg Oral Q6H  . aspirin EC  81 mg Oral Daily  . cholecalciferol  2,000 Units Oral Daily  . donepezil  10 mg Oral Daily  . DULoxetine  30 mg Oral Daily  . folic acid  1 mg Oral Daily  . iron polysaccharides  150 mg Oral Daily  . memantine  10 mg Oral BID  . metoprolol succinate  12.5 mg Oral Daily  . mupirocin ointment   Topical BID  . polyethylene glycol  17 g Oral BID  . sodium chloride flush  3 mL Intravenous Q12H   Continuous Infusions: . sodium chloride    . ampicillin-sulbactam (UNASYN) IV Stopped  (06/03/17 1428)  . furosemide (LASIX) infusion 10 mg/hr (06/03/17 1002)   PRN Meds:.sodium chloride, acetaminophen, calamine, morphine injection, ondansetron (ZOFRAN) IV, sodium chloride, sodium chloride flush Medications Prior to Admission:  Prior to Admission medications   Medication Sig Start Date End Date Taking? Authorizing Provider  acetaminophen (TYLENOL) 500 MG tablet Take 1,000 mg by mouth every 6 (six) hours.    Yes [provider]  Cholecalciferol (  VITAMIN D3) 2000 UNITS TABS Take 2,000 Units by mouth daily.   Yes [provider]  donepezil (ARICEPT) 10 MG tablet Take 1 tablet (10 mg total) by mouth at bedtime. Patient taking differently: Take 10 mg by mouth daily.  06/18/16  Yes Garvin Fila, MD  DULoxetine (CYMBALTA) 30 MG capsule TAKE 1 CAPSULE BY MOUTH ONCE DAILY 03/19/17  Yes Chipper Herb, MD  FeFum-FePoly-FA-B Cmp-C-Biot (INTEGRA PLUS) CAPS Take 1 capsule by mouth daily. 12/27/16  Yes Chipper Herb, MD  folic acid (FOLVITE) 1 MG tablet Take 1 mg by mouth daily.   Yes [provider]  furosemide (LASIX) 40 MG tablet TAKE 1 TABLET BY MOUTH TWICE DAILY AS DIRECTED 02/07/17  Yes Chipper Herb, MD  memantine (NAMENDA) 10 MG tablet Take 10 mg by mouth 2 (two) times daily. 05/21/17  Yes [provider]  metoprolol succinate (TOPROL-XL) 25 MG 24 hr tablet TAKE ONE-HALF TABLET BY MOUTH ONCE DAILY 12/20/16  Yes Chipper Herb, MD  mupirocin ointment (BACTROBAN) 2 % Apply topically 2 (two) times daily. 04/03/17  Yes Eustaquio Maize, MD  Triamcinolone Acetonide (TRIAMCINOLONE 0.1 % CREAM : EUCERIN) CREA Apply 1 application topically 2 (two) times daily. Patient taking differently: Apply 1 application topically 2 (two) times daily as needed for rash, itching or irritation.  10/03/16  Yes Chipper Herb, MD  warfarin (COUMADIN) 5 MG tablet Take 2.5 mg by mouth daily at 6 PM.   Yes [provider]  Elastic Bandages & Supports (V-2 HIGH  COMPRESSION HOSE) [place] Wear daily 01/23/17   Hassell Done, Mary-Margaret, FNP  memantine [facility] TITRATION PAK) tablet pack 5 mg/day for =1 week; 5 mg twice daily for =1 week; 15 mg/day given in 5 mg and 10 mg separated doses for =1 week; then 10 mg twice daily Patient not taking: Reported on 05/31/2017 04/10/17   Garvin Fila, MD  warfarin (COUMADIN) 5 MG tablet TAKE 1 TABLET BY MOUTH ONCE DAILY Patient not taking: Reported on 05/31/2017 01/01/17   Chipper Herb, MD   Allergies  Allergen Reactions  . Diphenhydramine Hcl Hives  . Livalo [Pitavastatin Calcium] Other (See Comments)    Pt doesn't remember reaction  . Sulfa Antibiotics Other (See Comments)    Pt doesn't remember reaction  . Zocor [Simvastatin] Other (See Comments)    Pt doesn't remember reaction Leg cramping   . Actonel [Risedronate Sodium] Other (See Comments)    Esophagitis Esophagitis   . Fosamax [Alendronate Sodium] Other (See Comments)    Esophagitis  Esophagitis   . Pravastatin Other (See Comments)    Extreme leg cramping. Extreme leg cramping.   Review of Systems  Unable to perform ROS: Acuity of condition    Physical Exam  Constitutional: No distress.  Appears frail, acutely/chronically ill.   HENT:  Head: Atraumatic.  Cardiovascular: Normal rate and regular rhythm.  Pulmonary/Chest: Effort normal. No respiratory distress.  Abdominal: Soft. She exhibits distension.  Musculoskeletal: She exhibits edema.  Neurological:  Known dementia  Skin: Skin is warm and dry.  Nursing note and vitals reviewed.   Vital Signs: BP (!) 146/72   Pulse 85   Temp 98.6 F (37 C) (Axillary)   Resp (!) 21   Ht 5\' 4"  (1.626 m)   Wt 60.4 kg (133 lb 2.5 oz)   LMP 08/01/1982   SpO2 96%   BMI 22.86 kg/m  Pain Assessment: 0-10   Pain Score: 9    SpO2: SpO2: 96 %  O2 Device:SpO2: 96 % O2 Flow Rate: .O2 Flow Rate (L/min): 10 L/min  IO: Intake/output summary:   Intake/Output Summary (Last 24 hours) at 06/03/2017  1505 Last data filed at 06/03/2017 0800 Gross per 24 hour  Intake 581.67 ml  Output 650 ml  Net -68.33 ml    LBM:   Baseline Weight: Weight: 63 kg (139 lb) Most recent weight: Weight: 60.4 kg (133 lb 2.5 oz)     Palliative Assessment/Data:   Flowsheet Rows     Most Recent Value  Intake Tab  Referral Department  Hospitalist  Unit at Time of Referral  ICU  Palliative Care Primary Diagnosis  Cardiac  Date Notified  06/03/17  Palliative Care Type  New Palliative care  Date of Admission  05/31/17  Date first seen by Palliative Care  06/03/17  # of days Palliative referral response time  0 Day(s)  # of days IP prior to Palliative referral  3  Clinical Assessment  Palliative Performance Scale Score  30%  Pain Max last 24 hours  Not able to report  Pain Min Last 24 hours  Not able to report  Dyspnea Max Last 24 Hours  Not able to report  Dyspnea Min Last 24 hours  Not able to report  Psychosocial & Spiritual Assessment  Palliative Care Outcomes  Patient/Family meeting held?  Yes  Palliative Care Outcomes  Clarified goals of care, Provided psychosocial or spiritual support  Patient/Family wishes: Interventions discontinued/not started   Mechanical Ventilation      Time In: 1500 Time Out: 1540 Time Total: 40 minutes Greater than 50%  of this time was spent counseling and coordinating care related to the above assessment and plan.  Signed by: Drue Novel, NP   Please contact Palliative Medicine Team phone at [phone] for questions and concerns.  For individual provider: See Shea Evans

## 2017-06-03 NOTE — Progress Notes (Signed)
PROGRESS NOTE                                                                                                                                                                                                             Patient Demographics:    Patricia Villa, is a 84 y.o. female, DOB - 06-10-1932, ZDG:[id number]  Admit date - 05/31/2017   Admitting Physician [name], MD  Outpatient Primary MD for the patient is [name], MD  LOS - 3  Outpatient Specialists: Dr Luciano Cutter (cardiology)  Chief Complaint  Patient presents with  . Fatigue  . Hand Problem       Brief Narrative   84 year old female with severe mitral regurgitation with flail anterior leaflet, moderate to severe TR with pulmonary hypertension. She had TEE which showed ruptured cord and was then sent to [facility] where she had mitral valve repair using mitral clips. Last echo in December showed normal EF of 60% with mild MR and severe TR with pulmonary artery pressure of 60 mmHg. She was seen by her cardiologist in December 2018 and was noted to be increasingly frail with significant failure to thrive and dyspnea on minimal exertion. He discussed options of right heart catheterization but patient's son refused and inclined to worse comfort measures. As per signs patient has been progressively declining in the past few months. She also has progressive dementia with cognitive deficit. She was admitted to the hospital on 1/26 with acute exacerbation of CHF with dyspnea and hypoxia. She was found to have significant left-sided pleural effusion.  Patient ordered for left thoracentesis for today. On my exam this morning she was short of breath and in some distress. She was also found to have significant ascites on exam. Patient sent to radiology for thoracentesis where she was found to be dissecting in the 70s to 80s on nasal cannula. She had about 1.3 L of  serosanguineous fluid drained. Follow-up chest x-ray showed small apical pneumothorax. After arriving to the floor she had cool and bluish fingers with difficulty accessing her pulse oximetry. An ABG done showed pH of 7.46, PCO2 of 35 and PO2 of 81%. Patient transferred to stepdown unit on 10 L O2 via nasal cannula and was subsequently transitioned to nonrebreather for progressive shortness of breath.    Subjective:   Some improvement in bleeding  after placed on Lasix drip. Off and RB and on 6-8 L via nasal cannula. Still appears uncomfortable.   Assessment  & Plan :    Principal Problem: Acute on chronic systolic and diastolic CHF (HCC) Acute pulmonary edema Acute decompensation of CHF with biventricular failure/cor pulmonale with pulmonary hypertension and severe TR. Patient had left thoracentesis with 1.3 L serosanguineous fluid removed (labs sent for cell count, culture, LDH, albumin and cytology).rapidly accumulation of fluid. -continue IV Lasix drip. IV morphine when necessary for dyspnea.  Cardiology consult appreciated. Overall prognosis is guarded with progressively worsening symptoms over past several weeks to months.  Discussed at length with patient's son at bedside who agree with palliative care consult for Goals of care discussion and need for comfort measures and possible residential hospice.    Active Problems: Abdominal ascites Secondary to biventricular failure. Ascites mildly improved with diuretic on exam this morning. Will hold off on paracentesis.    Atrial fibrillation, persistent (HCC) Coumadin on hold for possible thoracentesis if needed    Secondary pulmonary arterial hypertension (HCC)  follows with Dr. Delton Coombes. Had recommended that patient would not respond to vasodilators.   Elevated troponin Ischemic versus secondary to demand ischemia. Aiming for comfort measures.     Code Status :DO NOT RESUSCITATE  Family Communication  :son at  bedside  Disposition Plan  :  Pending palliative care consult.Likely goal for comfort measures.  Barriers For Discharge :  Active symptoms  Consults  :  Cardiology  Procedures  :   Left thoracentesis Pending 2-D echo   DVT Prophylaxis  :  None (therapeutic INR) Lab Results  Component Value Date   PLT 174 06/03/2017    Antibiotics  :    Anti-infectives (From admission, onward)   Start     Dose/Rate Route Frequency Ordered Stop   06/02/17 1800  ampicillin-sulbactam (UNASYN) 1.5 g in sodium chloride 0.9 % 50 mL IVPB     1.5 g 100 mL/hr over 30 Minutes Intravenous Every 6 hours 06/02/17 1659          Objective:   Vitals:   06/03/17 0400 06/03/17 0500 06/03/17 0714 06/03/17 1115  BP: 115/61     Pulse: 79     Resp: 16     Temp: (!) 97.1 F (36.2 C)  98.5 F (36.9 C) 98.6 F (37 C)  TempSrc: Axillary  Axillary Axillary  SpO2: 97%     Weight:  60.4 kg (133 lb 2.5 oz)    Height:        Wt Readings from Last 3 Encounters:  06/03/17 60.4 kg (133 lb 2.5 oz)  05/15/17 63 kg (139 lb)  04/23/17 63.5 kg (140 lb)     Intake/Output Summary (Last 24 hours) at 06/03/2017 1136 Last data filed at 06/03/2017 0800 Gross per 24 hour  Intake 581.67 ml  Output 650 ml  Net -68.33 ml     Physical Exam  Gen: elderly female appears fatigued and in some distress on nasal cannula HEENT:  Moist mucosa,JVD +, Chest: Diminished bibasilar breath sounds  CVS:  S1 and S2 irregular, systolic murmur 3/6 GI: soft, distended with abdominal ascites, nontender Musculoskeletal: warm,  trace edema     Data Review:    CBC Recent Labs  Lab 05/31/17 1346 06/03/17 0409  WBC 8.2 11.4*  HGB 12.9 13.1  HCT 40.6 40.5  PLT 183 174  MCV 107.7* 106.0*  MCH 34.2* 34.3*  MCHC 31.8 32.3  RDW 15.8* 15.7*  LYMPHSABS 1.1  --  MONOABS 1.1*  --   EOSABS 0.1  --   BASOSABS 0.0  --     Chemistries  Recent Labs  Lab 05/31/17 1346 06/01/17 0509 06/02/17 0514 06/03/17 0409  NA 139 139  135 135  K 3.8 3.8 3.5 3.6  CL 100* 99* 95* 98*  CO2 26 28 26 25   GLUCOSE 91 93 87 92  BUN 26* 25* 25* 26*  CREATININE 0.76 0.84 0.75 0.85  CALCIUM 8.8* 9.2 8.9 8.8*  AST 45*  --   --   --   ALT 22  --   --   --   ALKPHOS 96  --   --   --   BILITOT 3.4*  --   --   --    ------------------------------------------------------------------------------------------------------------------ No results for input(s): CHOL, HDL, LDLCALC, TRIG, CHOLHDL, LDLDIRECT in the last 72 hours.  No results found for: HGBA1C ------------------------------------------------------------------------------------------------------------------ No results for input(s): TSH, T4TOTAL, T3FREE, THYROIDAB in the last 72 hours.  Invalid input(s): FREET3 ------------------------------------------------------------------------------------------------------------------ No results for input(s): VITAMINB12, FOLATE, FERRITIN, TIBC, IRON, RETICCTPCT in the last 72 hours.  Coagulation profile Recent Labs  Lab 06/01/17 1339 06/02/17 0514 06/03/17 0409  INR 2.65 2.97 3.02    No results for input(s): DDIMER in the last 72 hours.  Cardiac Enzymes Recent Labs  Lab 05/31/17 1844 05/31/17 2254 06/01/17 0509  TROPONINI 0.09* 0.10* 0.09*   ------------------------------------------------------------------------------------------------------------------    Component Value Date/Time   BNP 1,714.0 (H) 05/31/2017 1346    Inpatient Medications  Scheduled Meds: . acetaminophen  1,000 mg Oral Q6H  . aspirin EC  81 mg Oral Daily  . cholecalciferol  2,000 Units Oral Daily  . donepezil  10 mg Oral Daily  . DULoxetine  30 mg Oral Daily  . folic acid  1 mg Oral Daily  . iron polysaccharides  150 mg Oral Daily  . memantine  10 mg Oral BID  . metoprolol succinate  12.5 mg Oral Daily  . mupirocin ointment   Topical BID  . polyethylene glycol  17 g Oral BID  . sodium chloride flush  3 mL Intravenous Q12H   Continuous  Infusions: . sodium chloride    . ampicillin-sulbactam (UNASYN) IV Stopped (06/03/17 1610)  . furosemide (LASIX) infusion 10 mg/hr (06/03/17 1002)   PRN Meds:.sodium chloride, acetaminophen, calamine, morphine injection, ondansetron (ZOFRAN) IV, sodium chloride, sodium chloride flush  Micro Results Recent Results (from the past 240 hour(s))  Culture, body fluid-bottle     Status: None (Preliminary result)   Collection Time: 06/02/17 12:36 PM  Result Value Ref Range Status   Specimen Description PLEURAL  Final   Special Requests BOTTLES DRAWN AEROBIC AND ANAEROBIC 10 CC EACH  Final   Culture NO GROWTH < 24 HOURS  Final   Report Status PENDING  Incomplete  Gram stain     Status: None   Collection Time: 06/02/17 12:36 PM  Result Value Ref Range Status   Specimen Description PLEURAL  Final   Special Requests NONE  Final   Gram Stain   Final    CYTOSPIN SMEAR NO ORGANISMS SEEN WBC PRESENT,BOTH PMN AND MONONUCLEAR    Report Status 06/02/2017 FINAL  Final  MRSA PCR Screening     Status: None   Collection Time: 06/02/17  2:45 PM  Result Value Ref Range Status   MRSA by PCR NEGATIVE NEGATIVE Final    Comment:        The GeneXpert MRSA Assay (FDA approved for NASAL specimens only), is  one component of a comprehensive MRSA colonization surveillance program. It is not intended to diagnose MRSA infection nor to guide or monitor treatment for MRSA infections.     Radiology Reports Dg Chest 1 View  Result Date: 06/02/2017 CLINICAL DATA:  LEFT pleural effusion post thoracentesis EXAM: CHEST 1 VIEW COMPARISON:  05/31/2017 FINDINGS: Enlargement of cardiac silhouette with pulmonary vascular congestion. Atherosclerotic calcification aorta. Enlarged central pulmonary arteries. Improved pulmonary infiltrates question pulmonary edema. Markedly decreased LEFT pleural effusion and basilar atelectasis post thoracentesis Tiny LEFT apex pneumothorax. Bones demineralized. IMPRESSION: Tiny LEFT apex  pneumothorax following thoracentesis. Significantly decreased LEFT pleural effusion and basilar atelectasis. Improved pulmonary edema. Findings called to Dr. Gonzella Lex on 06/02/2017 at 1220 hours. Electronically Signed   By: Ulyses Southward M.D.   On: 06/02/2017 12:25   US Abdomen Complete  Result Date: 06/02/2017 CLINICAL DATA:  Ascites EXAM: ABDOMEN ULTRASOUND COMPLETE COMPARISON:  08/26/2016 FINDINGS: Gallbladder: Well distended without stones or sonographic Murphy sign. Upper normal wall thickness, nonspecific in the setting of ascites. Common bile duct: Diameter: Nonvisualized question due to ascites and bowel Liver: Nodular appearing hepatic margins suggesting cirrhosis. No definite hepatic mass or intrahepatic biliary dilatation. Portal vein is patent on color Doppler imaging with normal direction of blood flow towards the liver. IVC: Normal appearance Pancreas: Normal appearance Spleen: Normal appearance, 5.7 cm length Right Kidney: Length: 10.3 cm.  Normal appearance Left Kidney: Length: 11.0 cm.  Normal appearance Abdominal aorta: Visualized portions normal appearance with distal aorta obscured by bowel gas Other findings: Large volume ascites. IMPRESSION: Cirrhotic liver with significant ascites. Nonvisualization of CBD but no intrahepatic biliary dilatation is identified. Electronically Signed   By: Ulyses Southward M.D.   On: 06/02/2017 15:51   Dg Chest Port 1 View  Result Date: 06/02/2017 CLINICAL DATA:  Small LEFT apex pneumothorax following LEFT thoracentesis earlier today, followup, increased RIGHT-side chest pain EXAM: PORTABLE CHEST 1 VIEW COMPARISON:  Portable exam in expiration at 1610 hours compared to 1157 hours FINDINGS: Tips of lung apices excluded, patient moving during study. Enlargement of cardiac silhouette with pulmonary vascular congestion. Increased BILATERAL pulmonary infiltrates since earlier exam. Small LEFT pleural effusion appears increased since earlier exam. No definite  pneumothorax identified the tip of the LEFT lung apex is excluded. IMPRESSION: Increased pulmonary infiltrates bilaterally likely reflecting increased pulmonary edema. Increased LEFT pleural effusion. No gross pneumothorax is identified though the tips of the lung apices are incompletely visualized due to patient movement. Findings called to Dr.  Gonzella Lex on 06/02/2017 at 1635 hours. Electronically Signed   By: Ulyses Southward M.D.   On: 06/02/2017 16:37   Dg Chest Port 1 View  Result Date: 05/31/2017 CLINICAL DATA:  Shortness of breath, fatigue, RIGHT hand swelling and redness for 1 week, history atrial fibrillation, hypertension EXAM: PORTABLE CHEST 1 VIEW COMPARISON:  07/23/2013, 02/17/2017 FINDINGS: Enlargement of cardiac silhouette with pulmonary vascular congestion. Atherosclerotic calcification aorta. Diffuse BILATERAL pulmonary infiltrates consistent with pulmonary edema and CHF, new. Large LEFT pleural effusion with significant atelectasis of the mid to lower LEFT lung, significantly increased since 02/17/2017. No definite RIGHT pleural effusion. No pneumothorax. Bones unremarkable. IMPRESSION: CHF with significant interval increase in LEFT pleural effusion and LEFT lung atelectasis since 02/17/2017. Electronically Signed   By: Ulyses Southward M.D.   On: 05/31/2017 14:37   US Thoracentesis Asp Pleural Space W/img Guide  Result Date: 06/02/2017 INDICATION: Large LEFT pleural effusion EXAM: ULTRASOUND GUIDED THERAPEUTIC LEFT THORACENTESIS MEDICATIONS: None. COMPLICATIONS: None immediate. PROCEDURE: Procedure, benefits, and risks  of procedure were discussed with patient. Written informed consent for procedure was obtained. Time out protocol followed. Pleural effusion localized by ultrasound at the posterior LEFT hemithorax. Skin prepped and draped in usual sterile fashion. Skin and soft tissues anesthetized with 8 mL of 1% lidocaine. 8 French thoracentesis catheter placed into the LEFT pleural space. 1.3 L of  serosanguineous fluid aspirated by syringe pump. Procedure tolerated well by patient with expected coughing during and after the procedure. Small LEFT apex pneumothorax noted on prost-procedural chest radiograph FINDINGS: As above IMPRESSION: Successful ultrasound guided LEFT thoracentesis yielding 1.3 L of pleural fluid. Electronically Signed   By: Ulyses Southward M.D.   On: 06/02/2017 12:24    Time Spent in minutes  35   Sherolyn Trettin M.D on 06/03/2017 at 11:36 AM  Between 7am to 7pm - Pager - [phone]  After 7pm go to [email or web address] - password [facility]  [facility] -  Office  [phone]

## 2017-06-03 NOTE — Clinical Social Work Note (Signed)
Received consult yesterday for SNF placement. Pt's condition has since worsened. MD indicated very guarded prognosis and a Palliative Care consult is ordered. Will clear the CSW consult for now. Will be available if pt's condition improves and CSW assistance is needed.

## 2017-06-04 LAB — PROTIME-INR
INR: 2.63
Prothrombin Time: 27.9 seconds — ABNORMAL HIGH (ref 11.4–15.2)

## 2017-06-04 LAB — BASIC METABOLIC PANEL
ANION GAP: 14 (ref 5–15)
BUN: 25 mg/dL — ABNORMAL HIGH (ref 6–20)
CHLORIDE: 95 mmol/L — AB (ref 101–111)
CO2: 26 mmol/L (ref 22–32)
Calcium: 8.7 mg/dL — ABNORMAL LOW (ref 8.9–10.3)
Creatinine, Ser: 0.9 mg/dL (ref 0.44–1.00)
GFR calc Af Amer: >60 mL/min (ref >60)
GFR calc non Af Amer: 58 mL/min — ABNORMAL LOW (ref >60)
GLUCOSE: 90 mg/dL (ref 65–99)
Potassium: 3.6 mmol/L (ref 3.5–5.1)
Sodium: 135 mmol/L (ref 135–145)

## 2017-06-04 MED ORDER — MORPHINE SULFATE (PF) 2 MG/ML IV SOLN
2.0000 mg | Freq: Once | INTRAVENOUS | Status: AC
  Administered 2017-06-04: 2 mg via INTRAVENOUS
  Filled 2017-06-04: qty 1

## 2017-06-04 MED ORDER — MORPHINE SULFATE (CONCENTRATE) 10 MG/0.5ML PO SOLN
2.5000 mg | ORAL | Status: DC | PRN
  Filled 2017-06-04: qty 0.5

## 2017-06-04 MED ORDER — MORPHINE SULFATE (CONCENTRATE) 10 MG/0.5ML PO SOLN
5.0000 mg | ORAL | Status: DC
  Administered 2017-06-04 – 2017-06-06 (×12): 5 mg via ORAL
  Filled 2017-06-04 (×11): qty 0.5

## 2017-06-04 MED ORDER — LORAZEPAM 2 MG/ML IJ SOLN: 1.0000 mg | INTRAMUSCULAR | Status: DC | PRN

## 2017-06-04 MED ORDER — FUROSEMIDE 10 MG/ML IJ SOLN: 40.0000 mg | Freq: Three times a day (TID) | INTRAMUSCULAR | Status: DC | PRN

## 2017-06-04 NOTE — Progress Notes (Signed)
PROGRESS NOTE                                                                                                                                                                                                             Patient Demographics:    Patricia Villa, is a 84 y.o. female, DOB - [birth year], ZOX:[id number]  Admit date - 05/31/2017   Admitting Physician [name], MD  Outpatient Primary MD for the patient is [name], MD  LOS - 4  Outpatient Specialists: Dr Luciano Cutter (cardiology)  Chief Complaint  Patient presents with  . Fatigue  . Hand Problem       Brief Narrative   84 year old female with severe mitral regurgitation with flail anterior leaflet, moderate to severe TR with pulmonary hypertension. She had TEE which showed ruptured cord and was then sent to [facility] where she had mitral valve repair using mitral clips. Last echo in December showed normal EF of 60% with mild MR and severe TR with pulmonary artery pressure of 60 mmHg. She was seen by her cardiologist in December 2018 and was noted to be increasingly frail with significant failure to thrive and dyspnea on minimal exertion. He discussed options of right heart catheterization but patient's son refused and inclined to worse comfort measures. As per signs patient has been progressively declining in the past few months. She also has progressive dementia with cognitive deficit. She was admitted to the hospital on 1/26 with acute exacerbation of CHF with dyspnea and hypoxia. She was found to have significant left-sided pleural effusion.  Patient ordered for left thoracentesis for today. On my exam this morning she was short of breath and in some distress. She was also found to have significant ascites on exam. Patient sent to radiology for thoracentesis where she was found to be dissecting in the 70s to 80s on nasal cannula. She had about 1.3 L of  serosanguineous fluid drained. Follow-up chest x-ray showed small apical pneumothorax. After arriving to the floor she had cool and bluish fingers with difficulty accessing her pulse oximetry. An ABG done showed pH of 7.46, PCO2 of 35 and PO2 of 81%. Patient transferred to stepdown unit on 10 L O2 via nasal cannula and was subsequently transitioned to nonrebreather for progressive shortness of breath.    Subjective:   Still short of breath  with increased abdominal distention today.  Complains of some pain in her legs.   Assessment  & Plan :    Principal Problem: Acute on chronic systolic and diastolic CHF (HCC) Acute pulmonary edema Acute decompensation of CHF with biventricular failure/cor pulmonale with pulmonary hypertension and severe TR. Patient had left thoracentesis with 1.3 L serosanguineous fluid removed (labs sent for cell count, culture, LDH, albumin and cytology).rapidly accumulation of fluid. -Placed on IV Lasix drip. IV morphine when necessary for dyspnea.  Cardiology consult appreciated. Overall prognosis is guarded with progressively worsening symptoms over past several weeks to months.  Discussed at length with patient's son at bedside who agreed with palliative care consult for Goals of care discussion and need for comfort measures and possible residential hospice.  Family met with palliative care consult this morning and both patient and family are in agreement with her going to residential hospice once bed available.  Comfort measures with oxygen as needed, IV morphine as needed, discontinue Lasix drip and placed on IV Lasix as needed for shortness of breath.  Comfort feeds.  IV Ativan as needed for anxiety. No further blood draws or test.  Discontinue telemetry.  Active Problems: Abdominal ascites Secondary to biventricular failure. Ascites mildly improved with diuretic on exam this morning. Will hold off on paracentesis.    Atrial fibrillation, persistent  (HCC) Coumadin discontinued.    Secondary pulmonary arterial hypertension (HCC)  follows with Dr. Delton Coombes. Had recommended that patient would not respond to vasodilators.   Elevated troponin Ischemic versus secondary to demand ischemia.  Now plan for comfort measures.     Code Status :DO NOT RESUSCITATE  Family Communication  :sons at bedside  Disposition Plan  : Awaiting residential hospice  Barriers For Discharge :  Active symptoms  Consults  :   Cardiology Palliative care  Procedures  :   Left thoracentesis   DVT Prophylaxis  :  None , comfort measures Lab Results  Component Value Date   PLT 174 06/03/2017    Antibiotics  :    Anti-infectives (From admission, onward)   Start     Dose/Rate Route Frequency Ordered Stop   06/02/17 1800  ampicillin-sulbactam (UNASYN) 1.5 g in sodium chloride 0.9 % 50 mL IVPB  Status:  Discontinued     1.5 g 100 mL/hr over 30 Minutes Intravenous Every 6 hours 06/02/17 1659 06/04/17 0946        Objective:   Vitals:   06/04/17 1100 06/04/17 1200 06/04/17 1300 06/04/17 1400  BP: 137/80     Pulse:      Resp: (!) 25 (!) 25 19 (!) 22  Temp:      TempSrc:      SpO2:      Weight:      Height:        Wt Readings from Last 3 Encounters:  06/04/17 60.4 kg (133 lb 2.5 oz)  05/15/17 63 kg (139 lb)  04/23/17 63.5 kg (140 lb)     Intake/Output Summary (Last 24 hours) at 06/04/2017 1520 Last data filed at 06/04/2017 0500 Gross per 24 hour  Intake 259.67 ml  Output 1650 ml  Net -1390.33 ml     Physical Exam General: Elderly female in distress, tachypneic and fatigued HEENT: Moist mucosa Chest: Diminished bilateral breath sounds CVS: S1-S2 irregular GI: Soft, distended with ascites Musculoskeletal: Warm, trace edema     Data Review:    CBC Recent Labs  Lab 05/31/17 1346 06/03/17 0409  WBC 8.2 11.4*  HGB 12.9 13.1  HCT 40.6 40.5  PLT 183 174  MCV 107.7* 106.0*  MCH 34.2* 34.3*  MCHC 31.8 32.3  RDW 15.8* 15.7*   LYMPHSABS 1.1  --   MONOABS 1.1*  --   EOSABS 0.1  --   BASOSABS 0.0  --     Chemistries  Recent Labs  Lab 05/31/17 1346 06/01/17 0509 06/02/17 0514 06/03/17 0409 06/04/17 0404  NA 139 139 135 135 135  K 3.8 3.8 3.5 3.6 3.6  CL 100* 99* 95* 98* 95*  CO2 26 28 26 25 26   GLUCOSE 91 93 87 92 90  BUN 26* 25* 25* 26* 25*  CREATININE 0.76 0.84 0.75 0.85 0.90  CALCIUM 8.8* 9.2 8.9 8.8* 8.7*  AST 45*  --   --   --   --   ALT 22  --   --   --   --   ALKPHOS 96  --   --   --   --   BILITOT 3.4*  --   --   --   --    ------------------------------------------------------------------------------------------------------------------ No results for input(s): CHOL, HDL, LDLCALC, TRIG, CHOLHDL, LDLDIRECT in the last 72 hours.  No results found for: HGBA1C ------------------------------------------------------------------------------------------------------------------ No results for input(s): TSH, T4TOTAL, T3FREE, THYROIDAB in the last 72 hours.  Invalid input(s): FREET3 ------------------------------------------------------------------------------------------------------------------ No results for input(s): VITAMINB12, FOLATE, FERRITIN, TIBC, IRON, RETICCTPCT in the last 72 hours.  Coagulation profile Recent Labs  Lab 06/01/17 1339 06/02/17 0514 06/03/17 0409 06/04/17 0404  INR 2.65 2.97 3.02 2.63    No results for input(s): DDIMER in the last 72 hours.  Cardiac Enzymes Recent Labs  Lab 05/31/17 1844 05/31/17 2254 06/01/17 0509  TROPONINI 0.09* 0.10* 0.09*   ------------------------------------------------------------------------------------------------------------------    Component Value Date/Time   BNP 1,714.0 (H) 05/31/2017 1346    Inpatient Medications  Scheduled Meds: . metoprolol succinate  12.5 mg Oral Daily  . morphine CONCENTRATE  5 mg Oral Q4H  . mupirocin ointment   Topical BID  . sodium chloride flush  3 mL Intravenous Q12H   Continuous  Infusions:  PRN Meds:.acetaminophen, calamine, furosemide, LORazepam, morphine CONCENTRATE, ondansetron (ZOFRAN) IV, sodium chloride, sodium chloride flush  Micro Results Recent Results (from the past 240 hour(s))  Culture, body fluid-bottle     Status: None (Preliminary result)   Collection Time: 06/02/17 12:36 PM  Result Value Ref Range Status   Specimen Description PLEURAL  Final   Special Requests BOTTLES DRAWN AEROBIC AND ANAEROBIC 10 CC EACH  Final   Culture NO GROWTH 2 DAYS  Final   Report Status PENDING  Incomplete  Gram stain     Status: None   Collection Time: 06/02/17 12:36 PM  Result Value Ref Range Status   Specimen Description PLEURAL  Final   Special Requests NONE  Final   Gram Stain   Final    CYTOSPIN SMEAR NO ORGANISMS SEEN WBC PRESENT,BOTH PMN AND MONONUCLEAR    Report Status 06/02/2017 FINAL  Final  MRSA PCR Screening     Status: None   Collection Time: 06/02/17  2:45 PM  Result Value Ref Range Status   MRSA by PCR NEGATIVE NEGATIVE Final    Comment:        The GeneXpert MRSA Assay (FDA approved for NASAL specimens only), is one component of a comprehensive MRSA colonization surveillance program. It is not intended to diagnose MRSA infection nor to guide or monitor treatment for MRSA infections.     Radiology Reports  Dg Chest 1 View  Result Date: 06/02/2017 CLINICAL DATA:  LEFT pleural effusion post thoracentesis EXAM: CHEST 1 VIEW COMPARISON:  05/31/2017 FINDINGS: Enlargement of cardiac silhouette with pulmonary vascular congestion. Atherosclerotic calcification aorta. Enlarged central pulmonary arteries. Improved pulmonary infiltrates question pulmonary edema. Markedly decreased LEFT pleural effusion and basilar atelectasis post thoracentesis Tiny LEFT apex pneumothorax. Bones demineralized. IMPRESSION: Tiny LEFT apex pneumothorax following thoracentesis. Significantly decreased LEFT pleural effusion and basilar atelectasis. Improved pulmonary edema.  Findings called to Dr. Gonzella Lex on 06/02/2017 at 1220 hours. Electronically Signed   By: Ulyses Southward M.D.   On: 06/02/2017 12:25   US Abdomen Complete  Result Date: 06/02/2017 CLINICAL DATA:  Ascites EXAM: ABDOMEN ULTRASOUND COMPLETE COMPARISON:  08/26/2016 FINDINGS: Gallbladder: Well distended without stones or sonographic Murphy sign. Upper normal wall thickness, nonspecific in the setting of ascites. Common bile duct: Diameter: Nonvisualized question due to ascites and bowel Liver: Nodular appearing hepatic margins suggesting cirrhosis. No definite hepatic mass or intrahepatic biliary dilatation. Portal vein is patent on color Doppler imaging with normal direction of blood flow towards the liver. IVC: Normal appearance Pancreas: Normal appearance Spleen: Normal appearance, 5.7 cm length Right Kidney: Length: 10.3 cm.  Normal appearance Left Kidney: Length: 11.0 cm.  Normal appearance Abdominal aorta: Visualized portions normal appearance with distal aorta obscured by bowel gas Other findings: Large volume ascites. IMPRESSION: Cirrhotic liver with significant ascites. Nonvisualization of CBD but no intrahepatic biliary dilatation is identified. Electronically Signed   By: Ulyses Southward M.D.   On: 06/02/2017 15:51   Dg Chest Port 1 View  Result Date: 06/02/2017 CLINICAL DATA:  Small LEFT apex pneumothorax following LEFT thoracentesis earlier today, followup, increased RIGHT-side chest pain EXAM: PORTABLE CHEST 1 VIEW COMPARISON:  Portable exam in expiration at 1610 hours compared to 1157 hours FINDINGS: Tips of lung apices excluded, patient moving during study. Enlargement of cardiac silhouette with pulmonary vascular congestion. Increased BILATERAL pulmonary infiltrates since earlier exam. Small LEFT pleural effusion appears increased since earlier exam. No definite pneumothorax identified the tip of the LEFT lung apex is excluded. IMPRESSION: Increased pulmonary infiltrates bilaterally likely reflecting  increased pulmonary edema. Increased LEFT pleural effusion. No gross pneumothorax is identified though the tips of the lung apices are incompletely visualized due to patient movement. Findings called to Dr.  Gonzella Lex on 06/02/2017 at 1635 hours. Electronically Signed   By: Ulyses Southward M.D.   On: 06/02/2017 16:37   Dg Chest Port 1 View  Result Date: 05/31/2017 CLINICAL DATA:  Shortness of breath, fatigue, RIGHT hand swelling and redness for 1 week, history atrial fibrillation, hypertension EXAM: PORTABLE CHEST 1 VIEW COMPARISON:  07/23/2013, 02/17/2017 FINDINGS: Enlargement of cardiac silhouette with pulmonary vascular congestion. Atherosclerotic calcification aorta. Diffuse BILATERAL pulmonary infiltrates consistent with pulmonary edema and CHF, new. Large LEFT pleural effusion with significant atelectasis of the mid to lower LEFT lung, significantly increased since 02/17/2017. No definite RIGHT pleural effusion. No pneumothorax. Bones unremarkable. IMPRESSION: CHF with significant interval increase in LEFT pleural effusion and LEFT lung atelectasis since 02/17/2017. Electronically Signed   By: Ulyses Southward M.D.   On: 05/31/2017 14:37   US Thoracentesis Asp Pleural Space W/img Guide  Result Date: 06/02/2017 INDICATION: Large LEFT pleural effusion EXAM: ULTRASOUND GUIDED THERAPEUTIC LEFT THORACENTESIS MEDICATIONS: None. COMPLICATIONS: None immediate. PROCEDURE: Procedure, benefits, and risks of procedure were discussed with patient. Written informed consent for procedure was obtained. Time out protocol followed. Pleural effusion localized by ultrasound at the posterior LEFT hemithorax. Skin prepped and draped in  usual sterile fashion. Skin and soft tissues anesthetized with 8 mL of 1% lidocaine. 8 French thoracentesis catheter placed into the LEFT pleural space. 1.3 L of serosanguineous fluid aspirated by syringe pump. Procedure tolerated well by patient with expected coughing during and after the procedure.  Small LEFT apex pneumothorax noted on prost-procedural chest radiograph FINDINGS: As above IMPRESSION: Successful ultrasound guided LEFT thoracentesis yielding 1.3 L of pleural fluid. Electronically Signed   By: Ulyses Southward M.D.   On: 06/02/2017 12:24    Time Spent in minutes  25   Tishie Altmann M.D on 06/04/2017 at 3:20 PM  Between 7am to 7pm - Pager - [phone]  After 7pm go to [email or web address] - password [facility]  [facility] -  Office  [phone]

## 2017-06-04 NOTE — Progress Notes (Signed)
Daily Progress Note   Patient Name: Patricia Villa       Date: 06/04/2017 DOB: [birth year]  Age: 84 y.o. MRN#: [id number] Attending Physician: Louellen Molder, MD Primary Care Physician: Chipper Herb, MD Admit Date: 05/31/2017  Reason for Consultation/Follow-up: Establishing goals of care, Inpatient hospice referral and Psychosocial/spiritual support  Subjective: Patricia Villa is resting in bed.  She will make eye contact and make her basic needs known.  She frequently frowns and seems uncomfortable.  Family states that she decided to not take any pain medication until after she had seen the doctor.  She is now given her dose of morphine.  Present today at bedside is family including son, Abbe Amsterdam and his wife Donah Driver, son Juanda Crumble and his wife Judson Roch.  I ask if they would be more comfortable going to my office for our meeting but they declined.  As we continue our conversation Patricia Villa continues to look uncomfortable stating that she wants [place] to be quiet.  I again ask if family would like to moved to my office, again they declined.  For the third time Patricia Villa states that she wants [place] to be quiet, at that point family agrees to go to my office for a family meeting.  We have a lengthy coversation and Ms. Villa room and in my office.  We talked about Patricia Villa's functional status, her psychological status.  Family tells a detailed story of decline over the last several years, and in particular the last 6 months.  Patricia Villa has battled anxiety and depression throughout her life.  When her husband became ill with a pulmonary embolism in 2010 she would ask him to stay at home, not do any chores.  Judson Roch tells a story of Patricia Villa walking from window to window watching her husband cut grass  coming more agitated over time.  They share that she has always been a private person both physically and emotionally.  They share that she does not share her health concerns with them until the last year when they have had to take her to doctor's appointments.  We reviewed health history in detail, including details about her heart history.  Arnette Norris tells a story of her valve repair in October 2016.  They share that she felt confined and restricted afterwards, rarely left the home.  Arnette Norris states that she was told that she would get approximately 80% improvement which she did.  Family agrees that although clinically her valve has been repaired, psychologically and emotionally she has not received 80% improvement.  We talked about what is normal and expected with aging and health problems.  We also talked in detail about what is normal with dementia, including physical, psychological, and emotional challenges.  Family agrees that she may have been showing signs of dementia when her husband became ill in 2010.  We talked about the likelihood of further functional decline, she may decide again to not get out of bed, also sequela of extended ICU stays can include urinary and fecal incontinence, which can complicate caregiving at home.  We talked about how to make choices for loved ones including 1) keeping them at the center of decision making, 2) are we doing something for them or to them, i.e. can we change what is happening, 3) what would the person she was 15-20 years ago say about her life now.  We talked about prognosis with permission.   We talked about choices for Patricia Villa when she leaves the hospital.  Including SNF rehab (I share that she would not qualify), SNF private pay (we all agree that SNF is not appropriate for Patricia Villa), home with 24-hour caregivers and the benefit of in-home hospice, and also the benefits of residential hospice in [place].  Talk about what is and is not provided at  hospice inpatient services.  Daughter-in-law Judson Roch has recent experience with inpatient hospice and is able to support family in understanding.   We talked about Juanda Crumble and Ephraim worries.  Juanda Crumble states that his worry is "losing her", but also worry for his father.  Arnette Norris states that his worry, after some thought, is that "she has no worries".   All family is in agreement for transition to comfort measures, referral to [facility] residential hospice.  They agreed to unburden Patricia Villa for medications and treatments that are changing what is happening, and are not focused on comfort.  Length of Stay: 4  Current Medications: Scheduled Meds:  . metoprolol succinate  12.5 mg Oral Daily  . mupirocin ointment   Topical BID  . sodium chloride flush  3 mL Intravenous Q12H    Continuous Infusions:   PRN Meds: acetaminophen, calamine, furosemide, LORazepam, morphine injection, ondansetron (ZOFRAN) IV, sodium chloride, sodium chloride flush  Physical Exam  Constitutional: She appears distressed.  Appears acutely/chronically ill.  Makes eye contact.  Seems uncomfortable  Cardiovascular: Normal rate.  Pulmonary/Chest: Effort normal. No respiratory distress.  Abdominal: Soft. She exhibits distension. There is no guarding.  Musculoskeletal: She exhibits no edema.  Neurological: She is alert.  Known dementia  Skin: Skin is warm and dry.  Psychiatric: Her behavior is normal.  Nursing note and vitals reviewed.           Vital Signs: BP 136/72   Pulse 85   Temp 98 F (36.7 C) (Axillary)   Resp (!) 25   Ht 5\' 4"  (1.626 m)   Wt 60.4 kg (133 lb 2.5 oz)   LMP 08/01/1982   SpO2 96%   BMI 22.86 kg/m  SpO2: SpO2: 96 % O2 Device: O2 Device: High Flow Nasal Cannula O2 Flow Rate: O2 Flow Rate (L/min): 10 L/min  Intake/output summary:   Intake/Output Summary (Last 24 hours) at 06/04/2017 1146 Last data filed at 06/04/2017 0500 Gross per 24 hour  Intake 259.67 ml  Output 1650  ml  Net -1390.33 ml   LBM:   Baseline Weight: Weight: 63 kg (139 lb) Most recent weight: Weight: 60.4 kg (133 lb 2.5 oz)       Palliative Assessment/Data:    Flowsheet Rows     Most Recent Value  Intake Tab  Referral Department  Hospitalist  Unit at Time of Referral  ICU  Palliative Care Primary Diagnosis  Cardiac  Date Notified  06/03/17  Palliative Care Type  New Palliative care  Reason for referral  Clarify Goals of Care  Date of Admission  05/31/17  Date first seen by Palliative Care  06/03/17  # of days Palliative referral response time  0 Day(s)  # of days IP prior to Palliative referral  3  Clinical Assessment  Palliative Performance Scale Score  30%  Pain Max last 24 hours  Not able to report  Pain Min Last 24 hours  Not able to report  Dyspnea Max Last 24 Hours  Not able to report  Dyspnea Min Last 24 hours  Not able to report  Psychosocial & Spiritual Assessment  Palliative Care Outcomes  Patient/Family meeting held?  Yes  Palliative Care Outcomes  Clarified goals of care, Provided psychosocial or spiritual support  Patient/Family wishes: Interventions discontinued/not started   Mechanical Ventilation      Patient Active Problem List   Diagnosis Date Noted  . Palliative care encounter   . Goals of care, counseling/discussion   . Encounter for hospice care discussion   . CHF exacerbation ([place]) 05/31/2017  . Swelling of right hand   . Secondary pulmonary arterial hypertension ([place]) 07/05/2016  . Dysuria 06/12/2016  . History of cystocele 06/12/2016  . Vocal cord paralysis, unilateral complete   . Vertebrobasilar insufficiency   . Stroke due to embolism of left cerebellar artery ([place])   . Aortic atherosclerosis ([place]) 11/20/2015  . Atrial fibrillation, persistent ([place]) 10/30/2015  . Atrial fibrillation ([place]) 11/23/2014  . Osteoporosis 08/31/2014  . Chronic neck pain 06/07/2014  . DDD (degenerative disc disease), cervical 01/24/2014  . Hyperlipidemia  05/17/2013  . Colitis 01/20/2013  . Generalized anxiety disorder 08/05/2012  . Meningioma ([place]) 08/05/2012  . Bruit 10/17/2010  . Essential hypertension, benign 10/04/2009  . MITRAL INSUFFICIENCY 09/26/2009  . Mitral valve disorder 09/26/2009  . Hypertrophic obstructive cardiomyopathy ([place]) 09/26/2009    Palliative Care Assessment & Plan   Patient Profile: 84 y.o. female  with past medical history of mitral insufficiency, pulmonary hypertension, persistent atrial fibrillation,acute on chronic heart failure, arthritis, anxiety, high blood pressure and cholesterol admitted on 05/31/2017 with Acute on chronic CHF.   Assessment: Acute on chronic heart failure; family is requesting transition to comfort care, no more invasive treatments or medications that are changing what is happening.  Requesting inpatient status at local hospice home.  Recommendations/Plan:  Requesting placement at [facility] hospice for comfort and dignity at end of life.  Goals of Care and Additional Recommendations:  Limitations on Scope of Treatment: Full Comfort Care  Code Status:    Code Status Orders  (From admission, onward)        Start     Ordered   05/31/17 1752  Do not attempt resuscitation (DNR)  Continuous    Question Answer Comment  In the event of cardiac or respiratory ARREST Do not call a "code blue"   In the event of cardiac or respiratory ARREST Do not perform Intubation, CPR, defibrillation or ACLS   In the event of cardiac or respiratory  ARREST Use medication by any route, position, wound care, and other measures to relive pain and suffering. May use oxygen, suction and manual treatment of airway obstruction as needed for comfort.      05/31/17 1751    Code Status History    Date Active Date Inactive Code Status Order ID Comments User Context   05/31/2017 17:21 05/31/2017 17:51 Full Code [id number]  Phillips Grout, MD Inpatient   01/02/2016 11:43 01/02/2016 19:48 Full  Code [id number]  Sherren Mocha, MD Inpatient   01/20/2013 10:58 01/22/2013 16:53 Full Code [id number]  Verlee Monte, MD Inpatient    Advance Directive Documentation     Most Recent Value  Type of Advance Directive  Healthcare Power of Attorney, Living will  Pre-existing out of facility DNR order (yellow form or pink MOST form)  No data  "MOST" Form in Place?  No data       Prognosis:   < 4 weeks, or less would not be surprising based on current fluid overload with heart failure, frailty, poor functional status, patient family's desire to not seek aggressive treatment.  Discharge Planning:  Requesting residential hospice with [place] was discussed with nursing staff, case management, social worker, and Dr. Clementeen Graham.  Thank you for allowing the Palliative Medicine Team to assist in the care of this patient.   Time In: 0900 Time Out: 1120 Total Time 140 minutes Prolonged Time Billed  yes       Greater than 50%  of this time was spent counseling and coordinating care related to the above assessment and plan.  Drue Novel, NP  Please contact Palliative Medicine Team phone at [phone] for questions and concerns.

## 2017-06-04 NOTE — Clinical Social Work Note (Signed)
LCSW notified by [facility] that pt's family has elected hospice care for pt. They request referral to [facility] residence and are aware that there is currently no bed availability there so pt would likely remain here under GIP status.   Referral made to [facility] at hospice. Will follow up with MD once hospice notifies LCSW that they have everything completed to assume hospice care.

## 2017-06-04 NOTE — Care Management (Signed)
CM consult for Heart failure. Patient transitioned to comfort care with hospice referral to residential hospice. CM will sign off.

## 2017-06-04 NOTE — Clinical Social Work Note (Signed)
Demographics  Comment    Last edited by  on at   Address: Home Phone: Work Phone: Mobile Phone:  [address] [address]   [phone] -- [phone]  SSN: Insurance: Marital Status: Religion:  [id number] [place] [facility]ried [facility]  Emergency Contact(s)   Name Relation Home Work Nicholson Son [phone]  [phone]  Madelynn, Malson   [phone]  Patient Ethnicity & Race   Ethnic Group Patient Race  Not Hispanic or Latino White or [facility] Account   Name Acct ID Class Status Primary Coverage  Leighana, Neyman [id number] Inpatient [facility] [place] HMO      Guarantor Account (for Hospital Account [id number])   Name Relation to Pt Service Area Active? Acct Type  Elza Rafter Self [employer]SA Yes Personal/Family  Address Phone    [address] [place], [place] [address] [phone])        Coverage Information (for Hospital Account [id number])   1. [facility] MEDICARE HMO   F/O Payor/Plan Precert #  [facility] [place] MEDICARE HMO   Subscriber Subscriber #  B[place], Dray [id number]  Address Phone  [address] [place] [address] [phone]  2. [employer] BLUE [employer]/BCBS SUPPLEMENT   F/O Payor/Plan Precert #  [employer] BLUE [employer]IELD/BCBS SUPPLEMENT   Subscriber Subscriber #  [place], [place] [id number]  Address Phone  [address] [place], [place] [address] [phone]

## 2017-06-04 NOTE — Progress Notes (Signed)
   Progress Note  Patient Name: Patricia Villa Date of Encounter: 06/04/2017  Primary Cardiologist: Dr. Minus Breeding  Chart reviewed since consultation yesterday.  Patient and family are having meeting with Palliative Care this morning to discuss goals of care, possibly comfort measures.  No major change in hemodynamic status overnight.  She has had approximately 1300 cc net out more than in on Lasix drip.  BUN and creatinine 25 and 0.9 respectively, potassium 3.6.  Atrial fibrillation persists and is rate controlled on review of telemetry.  Continue aspirin and Toprol-XL.  No additional cardiac testing planned at this point.  Signed, Rozann Lesches, MD  06/04/2017, 8:26 AM

## 2017-06-05 ENCOUNTER — Inpatient Hospital Stay (HOSPITAL_COMMUNITY)
Admission: RE | Admit: 2017-06-05 | Discharge: 2017-06-06 | DRG: 951 | Disposition: A | Discharge status: Hospice | Source: Ambulatory Visit | Attending: Internal Medicine | Admitting: Internal Medicine

## 2017-06-05 DIAGNOSIS — I081 Rheumatic disorders of both mitral and tricuspid valves: Secondary | ICD-10-CM | POA: Diagnosis present

## 2017-06-05 DIAGNOSIS — I2721 Secondary pulmonary arterial hypertension: Secondary | ICD-10-CM | POA: Diagnosis not present

## 2017-06-05 DIAGNOSIS — I509 Heart failure, unspecified: Secondary | ICD-10-CM | POA: Diagnosis not present

## 2017-06-05 DIAGNOSIS — I272 Pulmonary hypertension, unspecified: Secondary | ICD-10-CM | POA: Diagnosis present

## 2017-06-05 DIAGNOSIS — I481 Persistent atrial fibrillation: Secondary | ICD-10-CM | POA: Diagnosis not present

## 2017-06-05 DIAGNOSIS — I5043 Acute on chronic combined systolic (congestive) and diastolic (congestive) heart failure: Secondary | ICD-10-CM | POA: Diagnosis present

## 2017-06-05 DIAGNOSIS — Z515 Encounter for palliative care: Principal | ICD-10-CM | POA: Diagnosis present

## 2017-06-05 DIAGNOSIS — R188 Other ascites: Secondary | ICD-10-CM | POA: Diagnosis present

## 2017-06-05 DIAGNOSIS — Z7189 Other specified counseling: Secondary | ICD-10-CM | POA: Diagnosis not present

## 2017-06-05 DIAGNOSIS — I5082 Biventricular heart failure: Secondary | ICD-10-CM | POA: Diagnosis present

## 2017-06-05 MED ORDER — MORPHINE SULFATE (CONCENTRATE) 10 MG/0.5ML PO SOLN: 2.5000 mg | ORAL | 0 refills | Status: AC | PRN

## 2017-06-05 MED ORDER — CALAMINE EX LOTN: TOPICAL_LOTION | CUTANEOUS | 0 refills | Status: AC | PRN

## 2017-06-05 MED ORDER — SCOPOLAMINE 1 MG/3DAYS TD PT72
1.0000 | MEDICATED_PATCH | TRANSDERMAL | Status: DC
  Administered 2017-06-05: 1.5 mg via TRANSDERMAL
  Filled 2017-06-05: qty 1

## 2017-06-05 MED ORDER — MORPHINE SULFATE (CONCENTRATE) 10 MG/0.5ML PO SOLN: 5.0000 mg | ORAL | 0 refills | Status: AC

## 2017-06-05 MED ORDER — SALINE SPRAY 0.65 % NA SOLN: 1.0000 | NASAL | Status: DC | PRN

## 2017-06-05 MED ORDER — LORAZEPAM 1 MG PO TABS: 1.0000 mg | ORAL_TABLET | ORAL | Status: DC | PRN

## 2017-06-05 MED ORDER — OXYMETAZOLINE HCL 0.05 % NA SOLN: 2.0000 | Freq: Two times a day (BID) | NASAL | Status: DC | PRN

## 2017-06-05 MED ORDER — SCOPOLAMINE 1 MG/3DAYS TD PT72: 1.0000 | MEDICATED_PATCH | TRANSDERMAL | 12 refills | Status: AC

## 2017-06-05 MED ORDER — GLYCOPYRROLATE 0.2 MG/ML IJ SOLN
0.1000 mg | Freq: Two times a day (BID) | INTRAMUSCULAR | Status: DC
Start: 2017-06-05 — End: 2017-06-05

## 2017-06-05 MED ORDER — GLYCOPYRROLATE 1 MG PO TABS: 1.0000 mg | ORAL_TABLET | Freq: Three times a day (TID) | ORAL | 0 refills | Status: AC

## 2017-06-05 MED ORDER — GLYCOPYRROLATE 1 MG PO TABS
1.0000 mg | ORAL_TABLET | Freq: Three times a day (TID) | ORAL | Status: DC
  Administered 2017-06-05 (×2): 1 mg via ORAL
  Filled 2017-06-05 (×3): qty 1

## 2017-06-05 MED ORDER — LORAZEPAM 1 MG PO TABS: 1.0000 mg | ORAL_TABLET | ORAL | 0 refills | Status: AC | PRN

## 2017-06-05 NOTE — Discharge Summary (Signed)
Physician Discharge Summary  Patricia Villa BLT:[id number] DOB: 29-May-1932 DOA: 05/31/2017  PCP: Chipper Herb, MD  Admit date: 05/31/2017 Discharge date: 06/05/2017  Admitted From: Home Disposition: Residential hospice  Recommendations for Outpatient Follow-up:  1. Discharge to residential hospice   Equipment/Devices: None  Discharge Condition: Guarded CODE STATUS: DNR, full comfort care Diet recommendation: Dysphagia level 2, comfort feed    Discharge Diagnoses:  Principal Problem: Acute combined systolic and diastolic CHF (HCC)  Active Problems:   Essential hypertension, benign   Mitral valve disorder   Hypertrophic obstructive cardiomyopathy (HCC)   Generalized anxiety disorder   Chronic neck pain   Atrial fibrillation, persistent (HCC)   Secondary pulmonary arterial hypertension (HCC)   Swelling of right hand   Palliative care encounter   Goals of care, counseling/discussion   Encounter for hospice care discussion  Brief narrative/HPI 84 year old female with severe mitral regurgitation with flail anterior leaflet, moderate to severe TR with pulmonary hypertension. She had TEE which showed ruptured cord and was then sent to [facility] where she had mitral valve repair using mitral clips. Last echo in December showed normal EF of 60% with mild MR and severe TR with pulmonary artery pressure of 60 mmHg. She was seen by her cardiologist in December 2018 and was noted to be increasingly frail with significant failure to thrive and dyspnea on minimal exertion. He discussed options of right heart catheterization but patient's son refused and inclined to worse comfort measures. As per signs patient has been progressively declining in the past few months. She also has progressive dementia with cognitive deficit. She was admitted to the hospital on 1/26 with acute exacerbation of CHF with dyspnea and hypoxia. She was found to have significant left-sided pleural effusion.  Patient  ordered for left thoracentesis for today. On my exam this morning she was short of breath and in some distress. She was also found to have significant ascites on exam. Patient sent to radiology for thoracentesis where she was found to be dissecting in the 70s to 80s on nasal cannula. She had about 1.3 L of serosanguineous fluid drained. Follow-up chest x-ray showed small apical pneumothorax. After arriving to the floor she had cool and bluish fingers with difficulty accessing her pulse oximetry. An ABG done showed pH of 7.46, PCO2 of 35 and PO2 of 81%. Patient transferred to stepdown unit on 10 L O2 via nasal cannula and was subsequently transitioned to nonrebreather for progressive shortness of breath  Hospital course  Principal Problem: Acute on chronic systolic and diastolic CHF ([facility]) Acute pulmonary edema Acute decompensation of CHF with biventricular failure/cor pulmonale with pulmonary hypertension and severe TR. Patient had left thoracentesis with 1.3 L serosanguineous fluid removed (labs sent for cell count, culture, LDH, albumin and cytology).rapidly accumulation of fluid. -Placed on IV Lasix drip. IV morphine when necessary for dyspnea.  Cardiology consult appreciated. Overall prognosis is guarded with progressively worsening symptoms over past several weeks to months.  Discussed at length with patient's son at bedside who agreed with palliative care consult for Goals of care discussion and need for comfort measures and possible residential hospice.  Family met with palliative care consult this morning and both patient and family are in agreement with her going to residential hospice once bed available.  Comfort measures with oxygen as needed,  discontinue Lasix drip and placed on IV Lasix as needed for shortness of breath.  Comfort feeds.  Ativan as needed for anxiety and sleep. Morphine solution concentrate as needed for  pain and shortness of breath. Glycopyrrolate and  scopolamine patch for secretions.  She will be discharged to residential hospice.  Active Problems: Abdominal ascites Secondary to biventricular failure.  Comfort measures.    Atrial fibrillation, persistent (HCC) Coumadin discontinued.    Secondary pulmonary arterial hypertension (HCC)  follows with Dr. Lamonte Sakai. Had recommended that patient would not respond to vasodilators.   Elevated troponin Ischemic versus secondary to demand ischemia.  Now plan for comfort measures.     Family Communication  :sons at bedside  Disposition Plan  : Awaiting residential hospice    Discharge Instructions   Allergies as of 06/05/2017      Reactions   Diphenhydramine Hcl Hives   Livalo [pitavastatin Calcium] Other (See Comments)   Pt doesn't remember reaction   Sulfa Antibiotics Other (See Comments)   Pt doesn't remember reaction   Zocor [simvastatin] Other (See Comments)   Pt doesn't remember reaction Leg cramping    Actonel [risedronate Sodium] Other (See Comments)   Esophagitis Esophagitis   Fosamax [alendronate Sodium] Other (See Comments)   Esophagitis  Esophagitis    Pravastatin Other (See Comments)   Extreme leg cramping. Extreme leg cramping.      Medication List    STOP taking these medications   donepezil 10 MG tablet Commonly known as:  ARICEPT   DULoxetine 30 MG capsule Commonly known as:  CYMBALTA   folic acid 1 MG tablet Commonly known as:  FOLVITE   furosemide 40 MG tablet Commonly known as:  LASIX   INTEGRA PLUS Caps   memantine 10 MG tablet Commonly known as:  NAMENDA   memantine tablet pack Commonly known as:  NAMENDA TITRATION PAK   metoprolol succinate 25 MG 24 hr tablet Commonly known as:  TOPROL-XL   mupirocin ointment 2 % Commonly known as:  BACTROBAN   triamcinolone 0.1 % cream : eucerin Crea   V-2 HIGH COMPRESSION HOSE Misc   Vitamin D3 2000 units Tabs   warfarin 5 MG tablet Commonly known as:  COUMADIN     TAKE  these medications   acetaminophen 500 MG tablet Commonly known as:  TYLENOL Take 1,000 mg by mouth every 6 (six) hours.   calamine lotion Apply topically as needed for itching.   glycopyrrolate 1 MG tablet Commonly known as:  ROBINUL Take 1 tablet (1 mg total) by mouth 3 (three) times daily.   LORazepam 1 MG tablet Commonly known as:  ATIVAN Take 1 tablet (1 mg total) by mouth every 4 (four) hours as needed for anxiety or sleep.   morphine CONCENTRATE 10 MG/0.5ML Soln concentrated solution Take 0.13 mLs (2.6 mg total) by mouth every hour as needed for moderate pain (breathlessness).   morphine CONCENTRATE 10 MG/0.5ML Soln concentrated solution Take 0.25 mLs (5 mg total) by mouth every 4 (four) hours.   scopolamine 1 MG/3DAYS Commonly known as:  TRANSDERM-SCOP Place 1 patch (1.5 mg total) onto the skin every 3 (three) days. Start taking on:  06/08/2017       Allergies  Allergen Reactions  . Diphenhydramine Hcl Hives  . Livalo [Pitavastatin Calcium] Other (See Comments)    Pt doesn't remember reaction  . Sulfa Antibiotics Other (See Comments)    Pt doesn't remember reaction  . Zocor [Simvastatin] Other (See Comments)    Pt doesn't remember reaction Leg cramping   . Actonel [Risedronate Sodium] Other (See Comments)    Esophagitis Esophagitis   . Fosamax [Alendronate Sodium] Other (See Comments)    Esophagitis  Esophagitis   . Pravastatin Other (See Comments)    Extreme leg cramping. Extreme leg cramping.      Procedures/Studies: Dg Chest 1 View  Result Date: 06/02/2017 CLINICAL DATA:  LEFT pleural effusion post thoracentesis EXAM: CHEST 1 VIEW COMPARISON:  05/31/2017 FINDINGS: Enlargement of cardiac silhouette with pulmonary vascular congestion. Atherosclerotic calcification aorta. Enlarged central pulmonary arteries. Improved pulmonary infiltrates question pulmonary edema. Markedly decreased LEFT pleural effusion and basilar atelectasis post thoracentesis Tiny  LEFT apex pneumothorax. Bones demineralized. IMPRESSION: Tiny LEFT apex pneumothorax following thoracentesis. Significantly decreased LEFT pleural effusion and basilar atelectasis. Improved pulmonary edema. Findings called to Dr. Clementeen Graham on 06/02/2017 at 1220 hours. Electronically Signed   By: Lavonia Dana M.D.   On: 06/02/2017 12:25   US Abdomen Complete  Result Date: 06/02/2017 CLINICAL DATA:  Ascites EXAM: ABDOMEN ULTRASOUND COMPLETE COMPARISON:  08/26/2016 FINDINGS: Gallbladder: Well distended without stones or sonographic Murphy sign. Upper normal wall thickness, nonspecific in the setting of ascites. Common bile duct: Diameter: Nonvisualized question due to ascites and bowel Liver: Nodular appearing hepatic margins suggesting cirrhosis. No definite hepatic mass or intrahepatic biliary dilatation. Portal vein is patent on color Doppler imaging with normal direction of blood flow towards the liver. IVC: Normal appearance Pancreas: Normal appearance Spleen: Normal appearance, 5.7 cm length Right Kidney: Length: 10.3 cm.  Normal appearance Left Kidney: Length: 11.0 cm.  Normal appearance Abdominal aorta: Visualized portions normal appearance with distal aorta obscured by bowel gas Other findings: Large volume ascites. IMPRESSION: Cirrhotic liver with significant ascites. Nonvisualization of CBD but no intrahepatic biliary dilatation is identified. Electronically Signed   By: Lavonia Dana M.D.   On: 06/02/2017 15:51   Dg Chest Port 1 View  Result Date: 06/02/2017 CLINICAL DATA:  Small LEFT apex pneumothorax following LEFT thoracentesis earlier today, followup, increased RIGHT-side chest pain EXAM: PORTABLE CHEST 1 VIEW COMPARISON:  Portable exam in expiration at 1610 hours compared to 1157 hours FINDINGS: Tips of lung apices excluded, patient moving during study. Enlargement of cardiac silhouette with pulmonary vascular congestion. Increased BILATERAL pulmonary infiltrates since earlier exam. Small LEFT  pleural effusion appears increased since earlier exam. No definite pneumothorax identified the tip of the LEFT lung apex is excluded. IMPRESSION: Increased pulmonary infiltrates bilaterally likely reflecting increased pulmonary edema. Increased LEFT pleural effusion. No gross pneumothorax is identified though the tips of the lung apices are incompletely visualized due to patient movement. Findings called to Dr.  Clementeen Graham on 06/02/2017 at 1635 hours. Electronically Signed   By: Lavonia Dana M.D.   On: 06/02/2017 16:37   Dg Chest Port 1 View  Result Date: 05/31/2017 CLINICAL DATA:  Shortness of breath, fatigue, RIGHT hand swelling and redness for 1 week, history atrial fibrillation, hypertension EXAM: PORTABLE CHEST 1 VIEW COMPARISON:  07/23/2013, 02/17/2017 FINDINGS: Enlargement of cardiac silhouette with pulmonary vascular congestion. Atherosclerotic calcification aorta. Diffuse BILATERAL pulmonary infiltrates consistent with pulmonary edema and CHF, new. Large LEFT pleural effusion with significant atelectasis of the mid to lower LEFT lung, significantly increased since 02/17/2017. No definite RIGHT pleural effusion. No pneumothorax. Bones unremarkable. IMPRESSION: CHF with significant interval increase in LEFT pleural effusion and LEFT lung atelectasis since 02/17/2017. Electronically Signed   By: Lavonia Dana M.D.   On: 05/31/2017 14:37   US Thoracentesis Asp Pleural Space W/img Guide  Result Date: 06/02/2017 INDICATION: Large LEFT pleural effusion EXAM: ULTRASOUND GUIDED THERAPEUTIC LEFT THORACENTESIS MEDICATIONS: None. COMPLICATIONS: None immediate. PROCEDURE: Procedure, benefits, and risks of procedure were discussed with patient. Written informed consent  for procedure was obtained. Time out protocol followed. Pleural effusion localized by ultrasound at the posterior LEFT hemithorax. Skin prepped and draped in usual sterile fashion. Skin and soft tissues anesthetized with 8 mL of 1% lidocaine. 8 French  thoracentesis catheter placed into the LEFT pleural space. 1.3 L of serosanguineous fluid aspirated by syringe pump. Procedure tolerated well by patient with expected coughing during and after the procedure. Small LEFT apex pneumothorax noted on prost-procedural chest radiograph FINDINGS: As above IMPRESSION: Successful ultrasound guided LEFT thoracentesis yielding 1.3 L of pleural fluid. Electronically Signed   By: Lavonia Dana M.D.   On: 06/02/2017 12:24       Subjective: Still short of breath.  Coughing with meals.  Discharge Exam: Vitals:   06/04/17 1300 06/04/17 1400  BP:    Pulse:    Resp: 19 (!) 22  Temp:    SpO2:     Vitals:   06/04/17 1100 06/04/17 1200 06/04/17 1300 06/04/17 1400  BP: 137/80     Pulse:      Resp: (!) 25 (!) 25 19 (!) 22  Temp:      TempSrc:      SpO2:      Weight:      Height:        General: Elderly female in distress, tachypneic and fatigued HEENT: Moist mucosa Chest: Diminished bilateral breath sounds CVS: S1-S2 irregular GI: Soft, distended with ascites Musculoskeletal: Warm, trace edema     The results of significant diagnostics from this hospitalization (including imaging, microbiology, ancillary and laboratory) are listed below for reference.     Microbiology: Recent Results (from the past 240 hour(s))  Culture, body fluid-bottle     Status: None (Preliminary result)   Collection Time: 06/02/17 12:36 PM  Result Value Ref Range Status   Specimen Description PLEURAL  Final   Special Requests BOTTLES DRAWN AEROBIC AND ANAEROBIC 10 CC EACH  Final   Culture NO GROWTH 3 DAYS  Final   Report Status PENDING  Incomplete  Gram stain     Status: None   Collection Time: 06/02/17 12:36 PM  Result Value Ref Range Status   Specimen Description PLEURAL  Final   Special Requests NONE  Final   Gram Stain   Final    CYTOSPIN SMEAR NO ORGANISMS SEEN WBC PRESENT,BOTH PMN AND MONONUCLEAR    Report Status 06/02/2017 FINAL  Final  MRSA PCR  Screening     Status: None   Collection Time: 06/02/17  2:45 PM  Result Value Ref Range Status   MRSA by PCR NEGATIVE NEGATIVE Final    Comment:        The GeneXpert MRSA Assay (FDA approved for NASAL specimens only), is one component of a comprehensive MRSA colonization surveillance program. It is not intended to diagnose MRSA infection nor to guide or monitor treatment for MRSA infections.      Labs: BNP (last 3 results) Recent Labs    08/22/16 1244 05/31/17 1346  BNP 1,677.3* 7,169.6*   Basic Metabolic Panel: Recent Labs  Lab 05/31/17 1346 06/01/17 0509 06/02/17 0514 06/03/17 0409 06/04/17 0404  NA 139 139 135 135 135  K 3.8 3.8 3.5 3.6 3.6  CL 100* 99* 95* 98* 95*  CO2 26 28 26 25 26   GLUCOSE 91 93 87 92 90  BUN 26* 25* 25* 26* 25*  CREATININE 0.76 0.84 0.75 0.85 0.90  CALCIUM 8.8* 9.2 8.9 8.8* 8.7*   Liver Function Tests: Recent Labs  Lab 05/31/17  1346  AST 45*  ALT 22  ALKPHOS 96  BILITOT 3.4*  PROT 7.2  ALBUMIN 2.9*   No results for input(s): LIPASE, AMYLASE in the last 168 hours. No results for input(s): AMMONIA in the last 168 hours. CBC: Recent Labs  Lab 05/31/17 1346 06/03/17 0409  WBC 8.2 11.4*  NEUTROABS 6.0  --   HGB 12.9 13.1  HCT 40.6 40.5  MCV 107.7* 106.0*  PLT 183 174   Cardiac Enzymes: Recent Labs  Lab 05/31/17 1346 05/31/17 1844 05/31/17 2254 06/01/17 0509  TROPONINI 0.08* 0.09* 0.10* 0.09*   BNP: Invalid input(s): POCBNP CBG: No results for input(s): GLUCAP in the last 168 hours. D-Dimer No results for input(s): DDIMER in the last 72 hours. Hgb A1c No results for input(s): HGBA1C in the last 72 hours. Lipid Profile No results for input(s): CHOL, HDL, LDLCALC, TRIG, CHOLHDL, LDLDIRECT in the last 72 hours. Thyroid function studies No results for input(s): TSH, T4TOTAL, T3FREE, THYROIDAB in the last 72 hours.  Invalid input(s): FREET3 Anemia work up No results for input(s): VITAMINB12, FOLATE, FERRITIN,  TIBC, IRON, RETICCTPCT in the last 72 hours. Urinalysis    Component Value Date/Time   COLORURINE YELLOW 08/02/2016 2133   APPEARANCEUR Clear 10/23/2016 1221   LABSPEC 1.020 08/02/2016 2133   PHURINE 5.0 08/02/2016 2133   GLUCOSEU Negative 10/23/2016 1221   HGBUR NEGATIVE 08/02/2016 2133   BILIRUBINUR Negative 10/23/2016 [place] 08/02/2016 2133   PROTEINUR Trace (A) 10/23/2016 1221   PROTEINUR 30 (A) 08/02/2016 2133   UROBILINOGEN negative 06/14/2015 1646   UROBILINOGEN 0.2 01/20/2013 0411   NITRITE Positive (A) 10/23/2016 1221   NITRITE NEGATIVE 08/02/2016 2133   LEUKOCYTESUR 2+ (A) 10/23/2016 1221   Sepsis Labs Invalid input(s): PROCALCITONIN,  WBC,  LACTICIDVEN Microbiology Recent Results (from the past 240 hour(s))  Culture, body fluid-bottle     Status: None (Preliminary result)   Collection Time: 06/02/17 12:36 PM  Result Value Ref Range Status   Specimen Description PLEURAL  Final   Special Requests BOTTLES DRAWN AEROBIC AND ANAEROBIC 10 CC EACH  Final   Culture NO GROWTH 3 DAYS  Final   Report Status PENDING  Incomplete  Gram stain     Status: None   Collection Time: 06/02/17 12:36 PM  Result Value Ref Range Status   Specimen Description PLEURAL  Final   Special Requests NONE  Final   Gram Stain   Final    CYTOSPIN SMEAR NO ORGANISMS SEEN WBC PRESENT,BOTH PMN AND MONONUCLEAR    Report Status 06/02/2017 FINAL  Final  MRSA PCR Screening     Status: None   Collection Time: 06/02/17  2:45 PM  Result Value Ref Range Status   MRSA by PCR NEGATIVE NEGATIVE Final    Comment:        The GeneXpert MRSA Assay (FDA approved for NASAL specimens only), is one component of a comprehensive MRSA colonization surveillance program. It is not intended to diagnose MRSA infection nor to guide or monitor treatment for MRSA infections.      Time coordinating discharge: Over 30 minutes  SIGNED:   Louellen Molder, MD  [facility] 06/05/2017,  4:00 PM Pager   If 7PM-7AM, please contact night-coverage [email or web address] Password [email or web address]

## 2017-06-05 NOTE — H&P (Signed)
Please see discharge summary for detail.  Patient is now hospice general inpatient care.

## 2017-06-05 NOTE — Progress Notes (Signed)
Daily Progress Note   Patient Name: Patricia Villa       Date: 06/05/2017 DOB: 12-23-1932  Age: 84 y.o. MRN#: [id number] Attending Physician: Louellen Molder, MD Primary Care Physician: Chipper Herb, MD Admit Date: 05/31/2017  Reason for Consultation/Follow-up: Establishing goals of care, Non pain symptom management and Psychosocial/spiritual support  Subjective: Patricia Villa is resting quietly in bed.  She greets me making and keeping eye contact.  She is calm, cooperative, and in no acute distress.  Present today at bedside is her family including son Juanda Crumble, and his 2 adult sons and their families.  She has complaints of discomfort from IV sites which will be removed.  She also has complaints of pain in her heels, offloading boots applied.  Length of Stay: 5  Current Medications: Scheduled Meds:  . glycopyrrolate  1 mg Oral TID  . morphine CONCENTRATE  5 mg Oral Q4H  . mupirocin ointment   Topical BID  . scopolamine  1 patch Transdermal Q72H  . sodium chloride flush  3 mL Intravenous Q12H    Continuous Infusions:   PRN Meds: acetaminophen, calamine, furosemide, LORazepam, morphine CONCENTRATE, ondansetron (ZOFRAN) IV, sodium chloride, sodium chloride flush  Physical Exam  Constitutional: No distress.  Makes and keeps eye contact, calm and cooperative, frail, acutely/chronically ill  HENT:  Head: Atraumatic.  Cardiovascular: Normal rate.  Pulmonary/Chest: Effort normal. No respiratory distress.  Abdominal: Soft. She exhibits no distension.  Musculoskeletal: She exhibits no edema.  Neurological: She is alert.  Skin: Skin is warm and dry.  Psychiatric: She has a normal mood and affect. Thought content normal.  Nursing note and vitals reviewed.           Vital Signs:  BP 137/80   Pulse 85   Temp 98 F (36.7 C) (Axillary)   Resp (!) 22   Ht 5\' 4"  (1.626 m)   Wt 60.4 kg (133 lb 2.5 oz)   LMP 08/01/1982   SpO2 96%   BMI 22.86 kg/m  SpO2: SpO2: 96 % O2 Device: O2 Device: High Flow Nasal Cannula O2 Flow Rate: O2 Flow Rate (L/min): 10 L/min  Intake/output summary:   Intake/Output Summary (Last 24 hours) at 06/05/2017 1501 Last data filed at 06/05/2017 0452 Gross per 24 hour  Intake -  Output 925 ml  Net -925  ml   LBM:   Baseline Weight: Weight: 63 kg (139 lb) Most recent weight: Weight: 60.4 kg (133 lb 2.5 oz)       Palliative Assessment/Data:    Flowsheet Rows     Most Recent Value  Intake Tab  Referral Department  Hospitalist  Unit at Time of Referral  ICU  Palliative Care Primary Diagnosis  Cardiac  Date Notified  06/03/17  Palliative Care Type  New Palliative care  Reason for referral  Clarify Goals of Care  Date of Admission  05/31/17  Date first seen by Palliative Care  06/03/17  # of days Palliative referral response time  0 Day(s)  # of days IP prior to Palliative referral  3  Clinical Assessment  Palliative Performance Scale Score  30%  Pain Max last 24 hours  Not able to report  Pain Min Last 24 hours  Not able to report  Dyspnea Max Last 24 Hours  Not able to report  Dyspnea Min Last 24 hours  Not able to report  Psychosocial & Spiritual Assessment  Palliative Care Outcomes  Patient/Family meeting held?  Yes  Palliative Care Outcomes  Clarified goals of care, Provided psychosocial or spiritual support  Patient/Family wishes: Interventions discontinued/not started   Mechanical Ventilation      Patient Active Problem List   Diagnosis Date Noted  . Palliative care encounter   . Goals of care, counseling/discussion   . Encounter for hospice care discussion   . CHF exacerbation ([place]) 05/31/2017  . Swelling of right hand   . Secondary pulmonary arterial hypertension ([place]) 07/05/2016  . Dysuria 06/12/2016  . History  of cystocele 06/12/2016  . Vocal cord paralysis, unilateral complete   . Vertebrobasilar insufficiency   . Stroke due to embolism of left cerebellar artery ([place])   . Aortic atherosclerosis ([place]) 11/20/2015  . Atrial fibrillation, persistent ([place]) 10/30/2015  . Atrial fibrillation ([place]) 11/23/2014  . Osteoporosis 08/31/2014  . Chronic neck pain 06/07/2014  . DDD (degenerative disc disease), cervical 01/24/2014  . Hyperlipidemia 05/17/2013  . Colitis 01/20/2013  . Generalized anxiety disorder 08/05/2012  . Meningioma ([place]) 08/05/2012  . Bruit 10/17/2010  . Essential hypertension, benign 10/04/2009  . MITRAL INSUFFICIENCY 09/26/2009  . Mitral valve disorder 09/26/2009  . Hypertrophic obstructive cardiomyopathy ([place]) 09/26/2009    Palliative Care Assessment & Plan   Patient Profile: 84 y.o.femalewith past medical history of mitral insufficiency, pulmonary hypertension, persistent atrial fibrillation,acute on chronic heart failure, arthritis, anxiety, high blood pressure and cholesteroladmitted on1/26/2019with Acute on chronic CHF.   Assessment: Acute on chronic heart failure; family is requesting transition to comfort care, no more invasive treatments or medications that are changing what is happening.  Requesting inpatient status at local hospice home.  Recommendations/Plan:  Requesting placement at [facility] hospice for comfort and dignity at end of life.  Goals of Care and Additional Recommendations:  Limitations on Scope of Treatment: Full Comfort Care  Code Status:    Code Status Orders  (From admission, onward)        Start     Ordered   05/31/17 1752  Do not attempt resuscitation (DNR)  Continuous    Question Answer Comment  In the event of cardiac or respiratory ARREST Do not call a "code blue"   In the event of cardiac or respiratory ARREST Do not perform Intubation, CPR, defibrillation or ACLS   In the event of cardiac or respiratory  ARREST Use medication by any route, position, wound care,  and other measures to relive pain and suffering. May use oxygen, suction and manual treatment of airway obstruction as needed for comfort.      05/31/17 1751    Code Status History    Date Active Date Inactive Code Status Order ID Comments User Context   05/31/2017 17:21 05/31/2017 17:51 Full Code [id number]  Phillips Grout, MD Inpatient   01/02/2016 11:43 01/02/2016 19:48 Full Code [id number]  Sherren Mocha, MD Inpatient   01/20/2013 10:58 01/22/2013 16:53 Full Code [id number]  Verlee Monte, MD Inpatient    Advance Directive Documentation     Most Recent Value  Type of Advance Directive  Healthcare Power of Attorney, Living will  Pre-existing out of facility DNR order (yellow form or pink MOST form)  No data  "MOST" Form in Place?  No data       Prognosis:   < 4 weeks or less would not be surprising based on current fluid overload with heart failure, frailty, poor functional status, patient family's desire to not seek aggressive treatment.  Discharge Planning:  Requesting residential hospice with [facility] was discussed with nursing staff, case management, social worker, and Dr. Clementeen Graham on next rounds.  Thank you for allowing the Palliative Medicine Team to assist in the care of this patient.   Time In:  1040 Time Out:  1105 Total Time  25 minutes Prolonged Time Billed  no       Greater than 50%  of this time was spent counseling and coordinating care related to the above assessment and plan.  Drue Novel, NP  Please contact Palliative Medicine Team phone at [phone] for questions and concerns.

## 2017-06-05 NOTE — Clinical Social Work Note (Signed)
LCSW following. Spoke with Meagan from Hospice this AM. She was here to meet with pt and family. Per Meagan, she discussed GIP with MD who will complete the orders.   In Progression, MD confirmed that he will complete steps to status pt as GIP. LCSW will be available if needed.

## 2017-06-05 NOTE — Care Management Note (Signed)
Case Management Note  Patient Details  Name: Patricia Villa MRN: [id number] Date of Birth: 10-04-1932  If discussed at [place] Length of Stay Meetings, dates discussed:   06/05/2017 Additional Comments:  Ian Cavey, Chauncey Reading, RN 06/05/2017, 12:11 PM

## 2017-06-06 DIAGNOSIS — I5043 Acute on chronic combined systolic (congestive) and diastolic (congestive) heart failure: Secondary | ICD-10-CM

## 2017-06-06 MED ORDER — GUAIFENESIN-DM 100-10 MG/5ML PO SYRP: 5.0000 mL | ORAL_SOLUTION | ORAL | Status: DC | PRN

## 2017-06-06 MED ORDER — LORAZEPAM 1 MG PO TABS: 1.0000 mg | ORAL_TABLET | ORAL | Status: DC | PRN

## 2017-06-06 MED ORDER — CALAMINE EX LOTN
TOPICAL_LOTION | CUTANEOUS | Status: DC | PRN
  Filled 2017-06-06: qty 177

## 2017-06-06 MED ORDER — ACETAMINOPHEN 500 MG PO TABS: 1000.0000 mg | ORAL_TABLET | Freq: Four times a day (QID) | ORAL | Status: DC

## 2017-06-06 MED ORDER — MORPHINE SULFATE (CONCENTRATE) 10 MG/0.5ML PO SOLN
5.0000 mg | ORAL | Status: DC
  Administered 2017-06-06 (×3): 5 mg via ORAL
  Filled 2017-06-06 (×3): qty 0.5

## 2017-06-06 MED ORDER — MORPHINE SULFATE (CONCENTRATE) 10 MG/0.5ML PO SOLN: 2.5000 mg | ORAL | Status: DC | PRN

## 2017-06-06 MED ORDER — ONDANSETRON HCL 4 MG PO TABS: 4.0000 mg | ORAL_TABLET | Freq: Three times a day (TID) | ORAL | Status: DC | PRN

## 2017-06-06 MED ORDER — GLYCOPYRROLATE 1 MG PO TABS: 1.0000 mg | ORAL_TABLET | Freq: Three times a day (TID) | ORAL | Status: DC | PRN

## 2017-06-06 MED ORDER — LIDOCAINE VISCOUS 2 % MT SOLN: 5.0000 mL | OROMUCOSAL | Status: DC | PRN

## 2017-06-06 MED ORDER — GLYCOPYRROLATE 1 MG PO TABS: 1.0000 mg | ORAL_TABLET | Freq: Three times a day (TID) | ORAL | Status: DC

## 2017-06-06 MED ORDER — MAGIC MOUTHWASH W/LIDOCAINE: 5.0000 mL | Freq: Four times a day (QID) | ORAL | Status: DC | PRN

## 2017-06-06 MED ORDER — BIOTENE DRY MOUTH MT LIQD: 15.0000 mL | OROMUCOSAL | Status: DC | PRN

## 2017-06-06 MED ORDER — SCOPOLAMINE 1 MG/3DAYS TD PT72: 1.0000 | MEDICATED_PATCH | TRANSDERMAL | Status: DC

## 2017-06-07 LAB — CULTURE, BODY FLUID W GRAM STAIN -BOTTLE: Culture: NO GROWTH

## 2017-07-01 IMAGING — DX DG THORACIC SPINE 2V
2 series · 2 of 2 positions shown · non-contrast
Comparison: 08/28/2015 chest radiograph.

CLINICAL DATA: Mid back pain radiating to the right ribs. No
reported injury.

EXAM:
THORACIC SPINE 2 VIEWS

[t-spine ap]
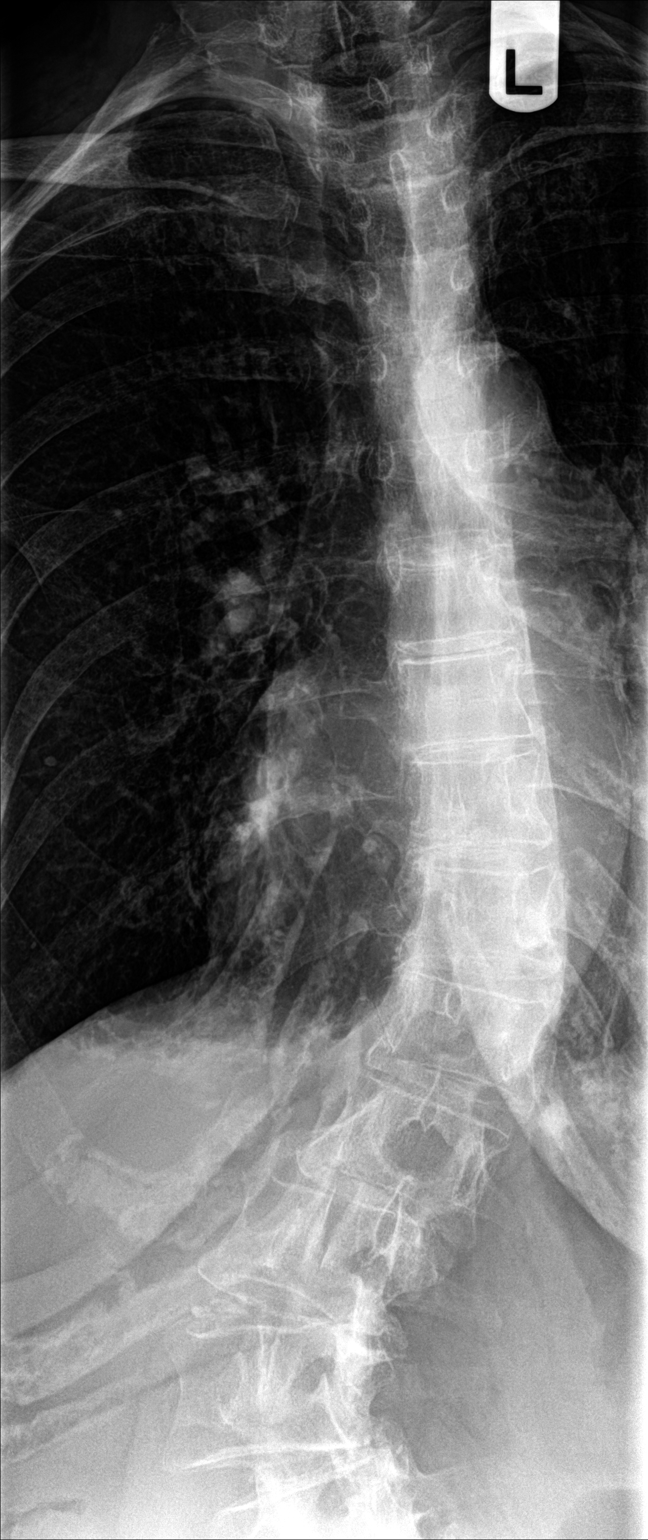

[t-spine lat]
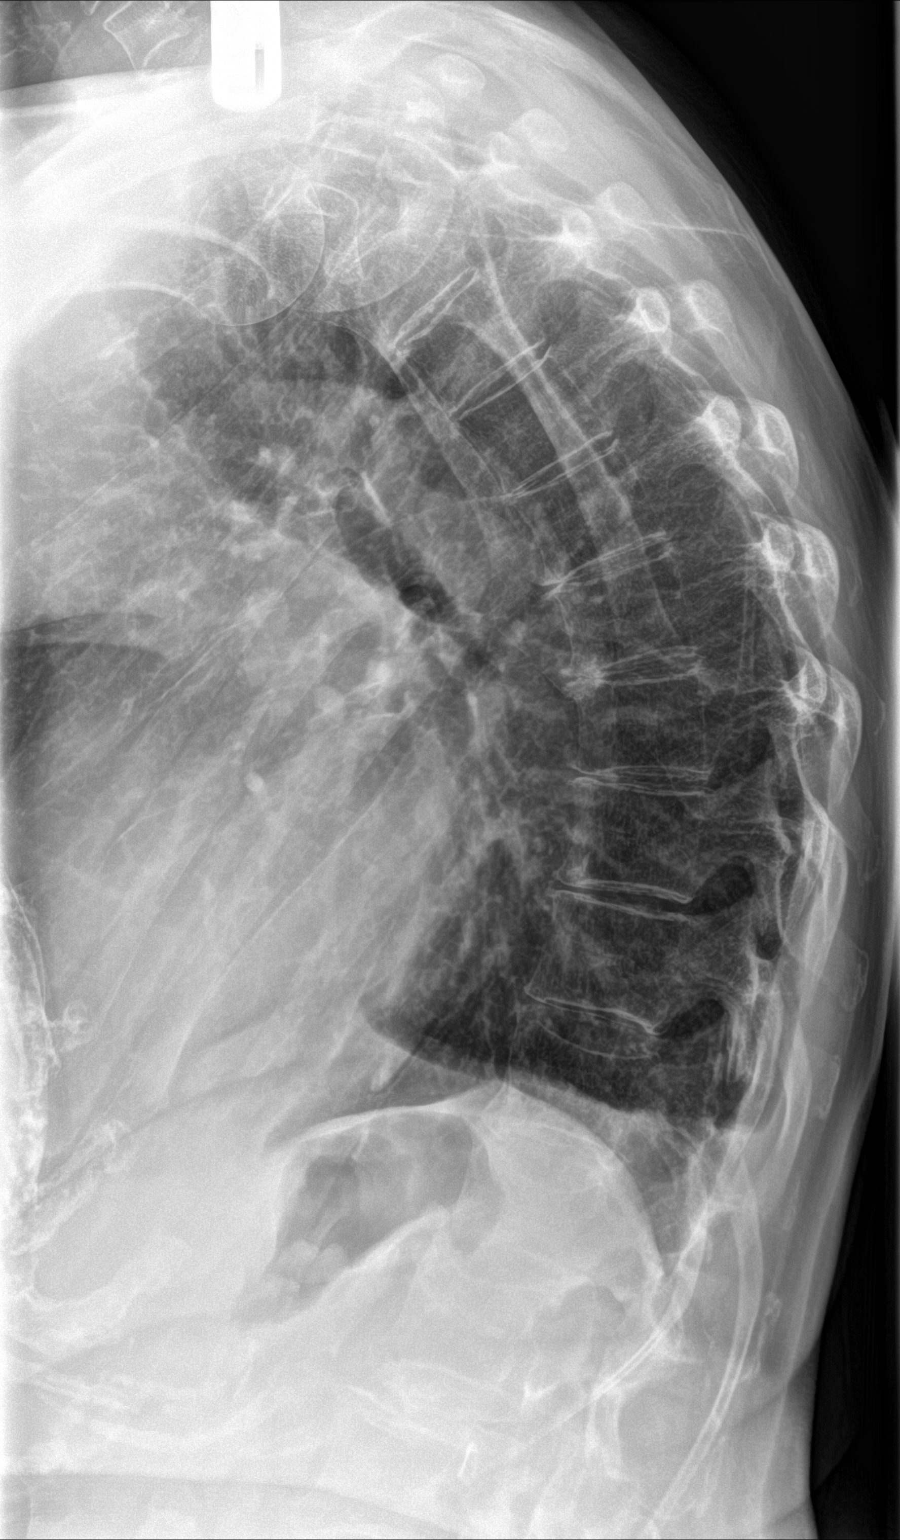

[2 of 2 positions shown; findings below may reference images not displayed]

FINDINGS: Moderate rotatory reverse S-shaped thoracolumbar scoliosis.
Mild-to-moderate thoracic spondylosis. Thoracic vertebral body
heights appear preserved, with no fracture, subluxation or
suspicious focal osseous lesion.
IMPRESSION: Moderate rotatory reverse S-shaped thoracolumbar scoliosis with
associated mild-to-moderate thoracic spondylosis. No fracture or
subluxation.

## 2017-07-04 NOTE — Progress Notes (Signed)
Report called to [facility].

## 2017-07-04 NOTE — Progress Notes (Signed)
PROGRESS NOTE                                                                                                                                                                                                             Patient Demographics:    Patricia Villa, is a 84 y.o. female, DOB - [birth year], ZOX:[id number]  Admit date - 06/16/17   Admitting Physician Eddie North, MD  Outpatient Primary MD for the patient is [name], MD  LOS - 0   No chief complaint on file.      Brief Narrative  Please refer to discharge summary from 34/45., 84 year old female with severe mitral regurgitation, moderate to severe TR, pulmonary hypertension presenting with acute on chronic decompensated biventricular failure with recurrent pleural effusion and ascites. Given her rapid decompensation, hospitalist and palliative care discussed with patient and family along with cardiology consult involved and transition her to full comfort measures.   Subjective:    complains of cough on trying to swallow anything.  Dyspnea unchanged.   Assessment  & Plan :   Acute on chronic systolic and diastolic CHF Acute decompensation.  Now goal for comfort.  Ordered morphine sulfate solution both scheduled and as needed for shortness of breath and pain.  Ativan as needed for anxiety and sleep.  Ordered glycopyrrolate and scopolamine patch for secretions. Comfort feed with dysphagia level 2 diet.  Discharge to residential hospice.   Family Communication  : son at bedside  Lab Results  Component Value Date   PLT 174 06/03/2017    Antibiotics  :    Anti-infectives (From admission, onward)   None        Objective:   There were no vitals filed for this visit.  Wt Readings from Last 3 Encounters:  06/04/17 60.4 kg (133 lb 2.5 oz)  05/15/17 63 kg (139 lb)  04/23/17 63.5 kg (140 lb)    No intake or output data in the 24 hours ending  June 16, 2017 1324   Physical Exam  Gen: In distress Chest: Diminished breath sounds bilaterally GI: Abdominal ascites   Data Review:    CBC Recent Labs  Lab 05/31/17 1346 06/03/17 0409  WBC 8.2 11.4*  HGB 12.9 13.1  HCT 40.6 40.5  PLT 183 174  MCV 107.7* 106.0*  MCH 34.2* 34.3*  MCHC 31.8 32.3  RDW 15.8* 15.7*  LYMPHSABS 1.1  --   MONOABS 1.1*  --   EOSABS 0.1  --   BASOSABS 0.0  --     Chemistries  Recent Labs  Lab 05/31/17 1346 06/01/17 0509 06/02/17 0514 06/03/17 0409 06/04/17 0404  NA 139 139 135 135 135  K 3.8 3.8 3.5 3.6 3.6  CL 100* 99* 95* 98* 95*  CO2 26 28 26 25 26   GLUCOSE 91 93 87 92 90  BUN 26* 25* 25* 26* 25*  CREATININE 0.76 0.84 0.75 0.85 0.90  CALCIUM 8.8* 9.2 8.9 8.8* 8.7*  AST 45*  --   --   --   --   ALT 22  --   --   --   --   ALKPHOS 96  --   --   --   --   BILITOT 3.4*  --   --   --   --    ------------------------------------------------------------------------------------------------------------------ No results for input(s): CHOL, HDL, LDLCALC, TRIG, CHOLHDL, LDLDIRECT in the last 72 hours.  No results found for: HGBA1C ------------------------------------------------------------------------------------------------------------------ No results for input(s): TSH, T4TOTAL, T3FREE, THYROIDAB in the last 72 hours.  Invalid input(s): FREET3 ------------------------------------------------------------------------------------------------------------------ No results for input(s): VITAMINB12, FOLATE, FERRITIN, TIBC, IRON, RETICCTPCT in the last 72 hours.  Coagulation profile Recent Labs  Lab 06/01/17 1339 06/02/17 0514 06/03/17 0409 06/04/17 0404  INR 2.65 2.97 3.02 2.63    No results for input(s): DDIMER in the last 72 hours.  Cardiac Enzymes Recent Labs  Lab 05/31/17 1844 05/31/17 2254 06/01/17 0509  TROPONINI 0.09* 0.10* 0.09*    ------------------------------------------------------------------------------------------------------------------    Component Value Date/Time   BNP 1,714.0 (H) 05/31/2017 1346    Inpatient Medications  Scheduled Meds: . acetaminophen  1,000 mg Oral Q6H  . glycopyrrolate  1 mg Oral TID  . morphine CONCENTRATE  5 mg Oral Q4H  . [START ON 06/08/2017] scopolamine  1 patch Transdermal Q72H   Continuous Infusions: PRN Meds:.calamine, guaiFENesin-dextromethorphan, lidocaine, LORazepam, morphine CONCENTRATE  Micro Results Recent Results (from the past 240 hour(s))  Culture, body fluid-bottle     Status: None (Preliminary result)   Collection Time: 06/02/17 12:36 PM  Result Value Ref Range Status   Specimen Description PLEURAL  Final   Special Requests BOTTLES DRAWN AEROBIC AND ANAEROBIC 10 CC EACH  Final   Culture   Final    NO GROWTH 4 DAYS Performed at [facility], [address], [place], [place] [address]    Report Status PENDING  Incomplete  Gram stain     Status: None   Collection Time: 06/02/17 12:36 PM  Result Value Ref Range Status   Specimen Description PLEURAL  Final   Special Requests NONE  Final   Gram Stain   Final    CYTOSPIN SMEAR NO ORGANISMS SEEN WBC PRESENT,BOTH PMN AND MONONUCLEAR    Report Status 06/02/2017 FINAL  Final  MRSA PCR Screening     Status: None   Collection Time: 06/02/17  2:45 PM  Result Value Ref Range Status   MRSA by PCR NEGATIVE NEGATIVE Final    Comment:        The GeneXpert MRSA Assay (FDA approved for NASAL specimens only), is one component of a comprehensive MRSA colonization surveillance program. It is not intended to diagnose MRSA infection nor to guide or monitor treatment for MRSA infections.     Radiology Reports Dg Chest 1 View  Result Date: 06/02/2017 CLINICAL DATA:  LEFT pleural effusion post thoracentesis EXAM: CHEST  1 VIEW COMPARISON:  05/31/2017 FINDINGS: Enlargement of cardiac silhouette with pulmonary  vascular congestion. Atherosclerotic calcification aorta. Enlarged central pulmonary arteries. Improved pulmonary infiltrates question pulmonary edema. Markedly decreased LEFT pleural effusion and basilar atelectasis post thoracentesis Tiny LEFT apex pneumothorax. Bones demineralized. IMPRESSION: Tiny LEFT apex pneumothorax following thoracentesis. Significantly decreased LEFT pleural effusion and basilar atelectasis. Improved pulmonary edema. Findings called to Dr. Gonzella Lex on 06/02/2017 at 1220 hours. Electronically Signed   By: Ulyses Southward M.D.   On: 06/02/2017 12:25   US Abdomen Complete  Result Date: 06/02/2017 CLINICAL DATA:  Ascites EXAM: ABDOMEN ULTRASOUND COMPLETE COMPARISON:  08/26/2016 FINDINGS: Gallbladder: Well distended without stones or sonographic Murphy sign. Upper normal wall thickness, nonspecific in the setting of ascites. Common bile duct: Diameter: Nonvisualized question due to ascites and bowel Liver: Nodular appearing hepatic margins suggesting cirrhosis. No definite hepatic mass or intrahepatic biliary dilatation. Portal vein is patent on color Doppler imaging with normal direction of blood flow towards the liver. IVC: Normal appearance Pancreas: Normal appearance Spleen: Normal appearance, 5.7 cm length Right Kidney: Length: 10.3 cm.  Normal appearance Left Kidney: Length: 11.0 cm.  Normal appearance Abdominal aorta: Visualized portions normal appearance with distal aorta obscured by bowel gas Other findings: Large volume ascites. IMPRESSION: Cirrhotic liver with significant ascites. Nonvisualization of CBD but no intrahepatic biliary dilatation is identified. Electronically Signed   By: Ulyses Southward M.D.   On: 06/02/2017 15:51   Dg Chest Port 1 View  Result Date: 06/02/2017 CLINICAL DATA:  Small LEFT apex pneumothorax following LEFT thoracentesis earlier today, followup, increased RIGHT-side chest pain EXAM: PORTABLE CHEST 1 VIEW COMPARISON:  Portable exam in expiration at 1610 hours  compared to 1157 hours FINDINGS: Tips of lung apices excluded, patient moving during study. Enlargement of cardiac silhouette with pulmonary vascular congestion. Increased BILATERAL pulmonary infiltrates since earlier exam. Small LEFT pleural effusion appears increased since earlier exam. No definite pneumothorax identified the tip of the LEFT lung apex is excluded. IMPRESSION: Increased pulmonary infiltrates bilaterally likely reflecting increased pulmonary edema. Increased LEFT pleural effusion. No gross pneumothorax is identified though the tips of the lung apices are incompletely visualized due to patient movement. Findings called to Dr.  Gonzella Lex on 06/02/2017 at 1635 hours. Electronically Signed   By: Ulyses Southward M.D.   On: 06/02/2017 16:37   Dg Chest Port 1 View  Result Date: 05/31/2017 CLINICAL DATA:  Shortness of breath, fatigue, RIGHT hand swelling and redness for 1 week, history atrial fibrillation, hypertension EXAM: PORTABLE CHEST 1 VIEW COMPARISON:  07/23/2013, 02/17/2017 FINDINGS: Enlargement of cardiac silhouette with pulmonary vascular congestion. Atherosclerotic calcification aorta. Diffuse BILATERAL pulmonary infiltrates consistent with pulmonary edema and CHF, new. Large LEFT pleural effusion with significant atelectasis of the mid to lower LEFT lung, significantly increased since 02/17/2017. No definite RIGHT pleural effusion. No pneumothorax. Bones unremarkable. IMPRESSION: CHF with significant interval increase in LEFT pleural effusion and LEFT lung atelectasis since 02/17/2017. Electronically Signed   By: Ulyses Southward M.D.   On: 05/31/2017 14:37   US Thoracentesis Asp Pleural Space W/img Guide  Result Date: 06/02/2017 INDICATION: Large LEFT pleural effusion EXAM: ULTRASOUND GUIDED THERAPEUTIC LEFT THORACENTESIS MEDICATIONS: None. COMPLICATIONS: None immediate. PROCEDURE: Procedure, benefits, and risks of procedure were discussed with patient. Written informed consent for procedure was  obtained. Time out protocol followed. Pleural effusion localized by ultrasound at the posterior LEFT hemithorax. Skin prepped and draped in usual sterile fashion. Skin and soft tissues anesthetized with 8 mL of 1% lidocaine. 8 French thoracentesis  catheter placed into the LEFT pleural space. 1.3 L of serosanguineous fluid aspirated by syringe pump. Procedure tolerated well by patient with expected coughing during and after the procedure. Small LEFT apex pneumothorax noted on prost-procedural chest radiograph FINDINGS: As above IMPRESSION: Successful ultrasound guided LEFT thoracentesis yielding 1.3 L of pleural fluid. Electronically Signed   By: Ulyses Southward M.D.   On: 06/02/2017 12:24    Time Spent in minutes  15   Filimon Miranda M.D on June 18, 2017 at 1:24 PM  Between 7am to 7pm - Pager - [phone]  After 7pm go to [email or web address] - password [facility]  [facility] -  Office  [phone]

## 2017-07-04 DEATH — deceased

## 2017-07-09 ENCOUNTER — Ambulatory Visit: Payer: Medicare Other | Admitting: Nurse Practitioner

## 2017-09-02 IMAGING — CT CT CTA ABD/PEL W/CM AND/OR W/O CM
1 series · 1 of 1 positions shown · IV contrast (isovue)
Comparison: CT exam of June 25, 2013.

CLINICAL DATA: Mitral valve disorder.

EXAM:
CT ANGIOGRAPHY CHEST, ABDOMEN AND PELVIS
TECHNIQUE: Multidetector CT imaging through the chest, abdomen and pelvis was
performed using the standard protocol during bolus administration of
intravenous contrast. Multiplanar reconstructed images and MIPs were
obtained and reviewed to evaluate the vascular anatomy.
CONTRAST:  75 mL of Isovue 370 intravenously.

[Series 1: scout · sagittal · 740.6mm · 0.57mm/px · 1 of 1 slices shown]
[im 1/1]
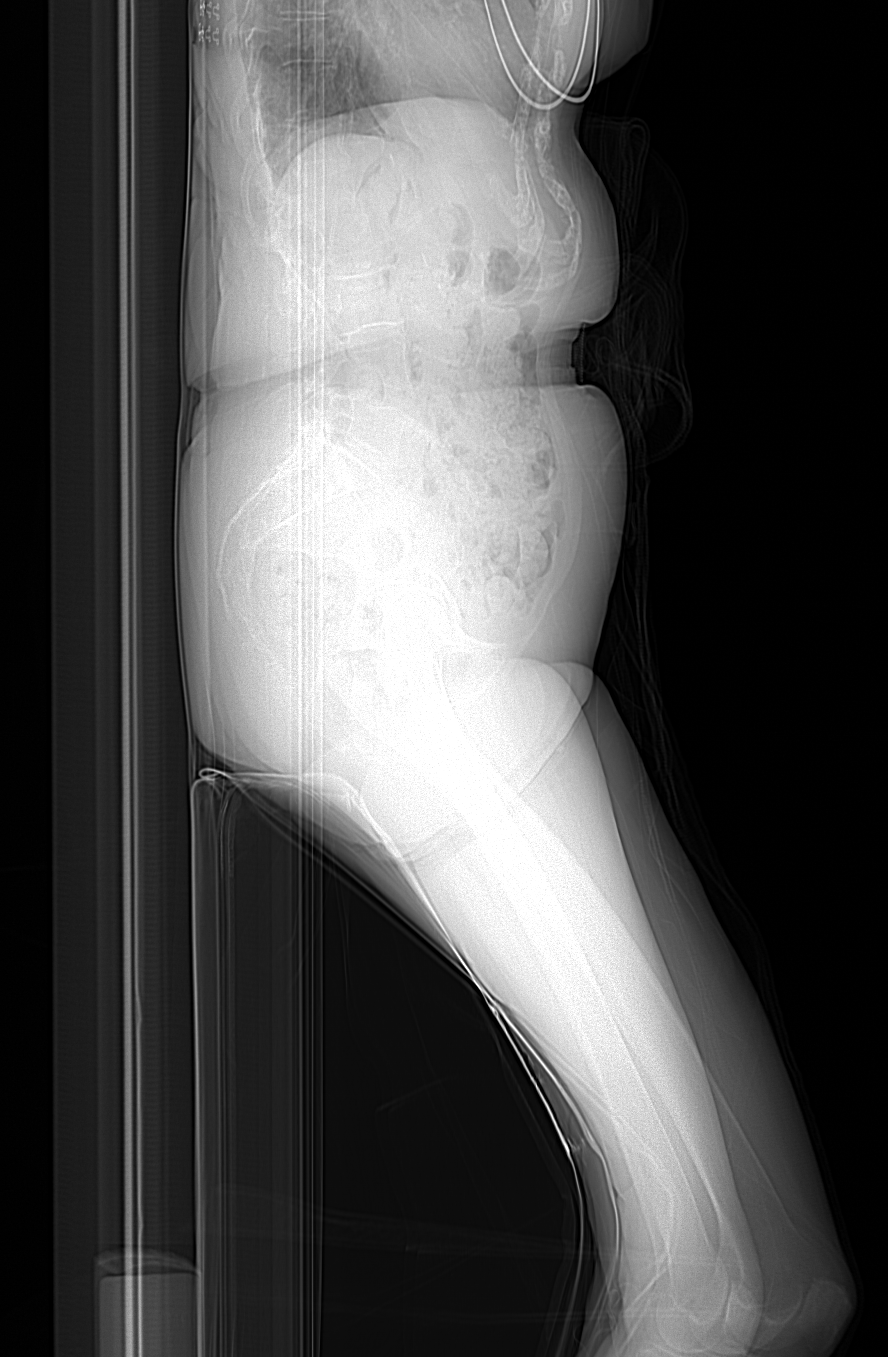

[1 of 1 positions shown; findings below may reference images not displayed]

FINDINGS: CTA CHEST FINDINGS

Cardiovascular: There is no evidence of thoracic aortic dissection
or aneurysm. Great vessels are widely patent. Atherosclerosis is
noted.

Mediastinum/Nodes: No mediastinal mass or adenopathy is noted.

Lungs/Pleura: No pneumothorax is noted. Minimal right pleural
effusion is noted. Minimal bibasilar subsegmental atelectasis is
noted posteriorly.

Musculoskeletal: No significant osseous abnormality is noted.

Review of the MIP images confirms the above findings.

CTA ABDOMEN AND PELVIS FINDINGS

VASCULAR

Aorta: Atherosclerosis of thoracic aorta is noted without aneurysm
or dissection.

Celiac: Patent.

SMA: Patent.

Renals: 2 renal arteries are noted on the right which are widely
patent. Two left renal arteries are also noted which are widely
patent.

IMA: Patent.

Inflow: Iliac arteries are widely patent without significant
stenosis.

Review of the MIP images confirms the above findings.

NON-VASCULAR

Hepatobiliary: No gallstones are noted.  Liver is unremarkable.

Pancreas: Normal.

Spleen: Normal.

Adrenals/Urinary Tract: Adrenal glands and kidneys appear normal. No
hydronephrosis or renal obstruction is noted. Urinary bladder
appears normal.

Stomach/Bowel: There is no evidence of bowel obstruction. Sigmoid
diverticulosis is noted without inflammation.

Lymphatic: No significant adenopathy is noted.

Musculoskeletal: Multilevel degenerative disc disease is noted in
the upper lumbar spine. Severe dextroscoliosis of lumbar spine is
noted as well.

Other: Status post hysterectomy.

Review of the MIP images confirms the above findings.
IMPRESSION: Atherosclerosis of thoracic and abdominal aorta is noted without
aneurysm or dissection.

Minimal right pleural effusion is noted. Minimal bilateral posterior
basilar subsegmental atelectasis is noted.

Sigmoid diverticulosis is noted without inflammation.

Severe dextroscoliosis of lumbar spine is noted with multilevel
degenerative disc disease.

## 2017-09-24 ENCOUNTER — Ambulatory Visit: Payer: Medicare Other | Admitting: Family Medicine

## 2019-01-10 IMAGING — CR DG CHEST 1V PORT
1 series · 1 of 1 positions shown · non-contrast
Comparison: 07/23/2013, 02/17/2017

CLINICAL DATA: Shortness of breath, fatigue, RIGHT hand swelling
and redness for 1 week, history atrial fibrillation, hypertension

EXAM:
PORTABLE CHEST 1 VIEW

[portable]
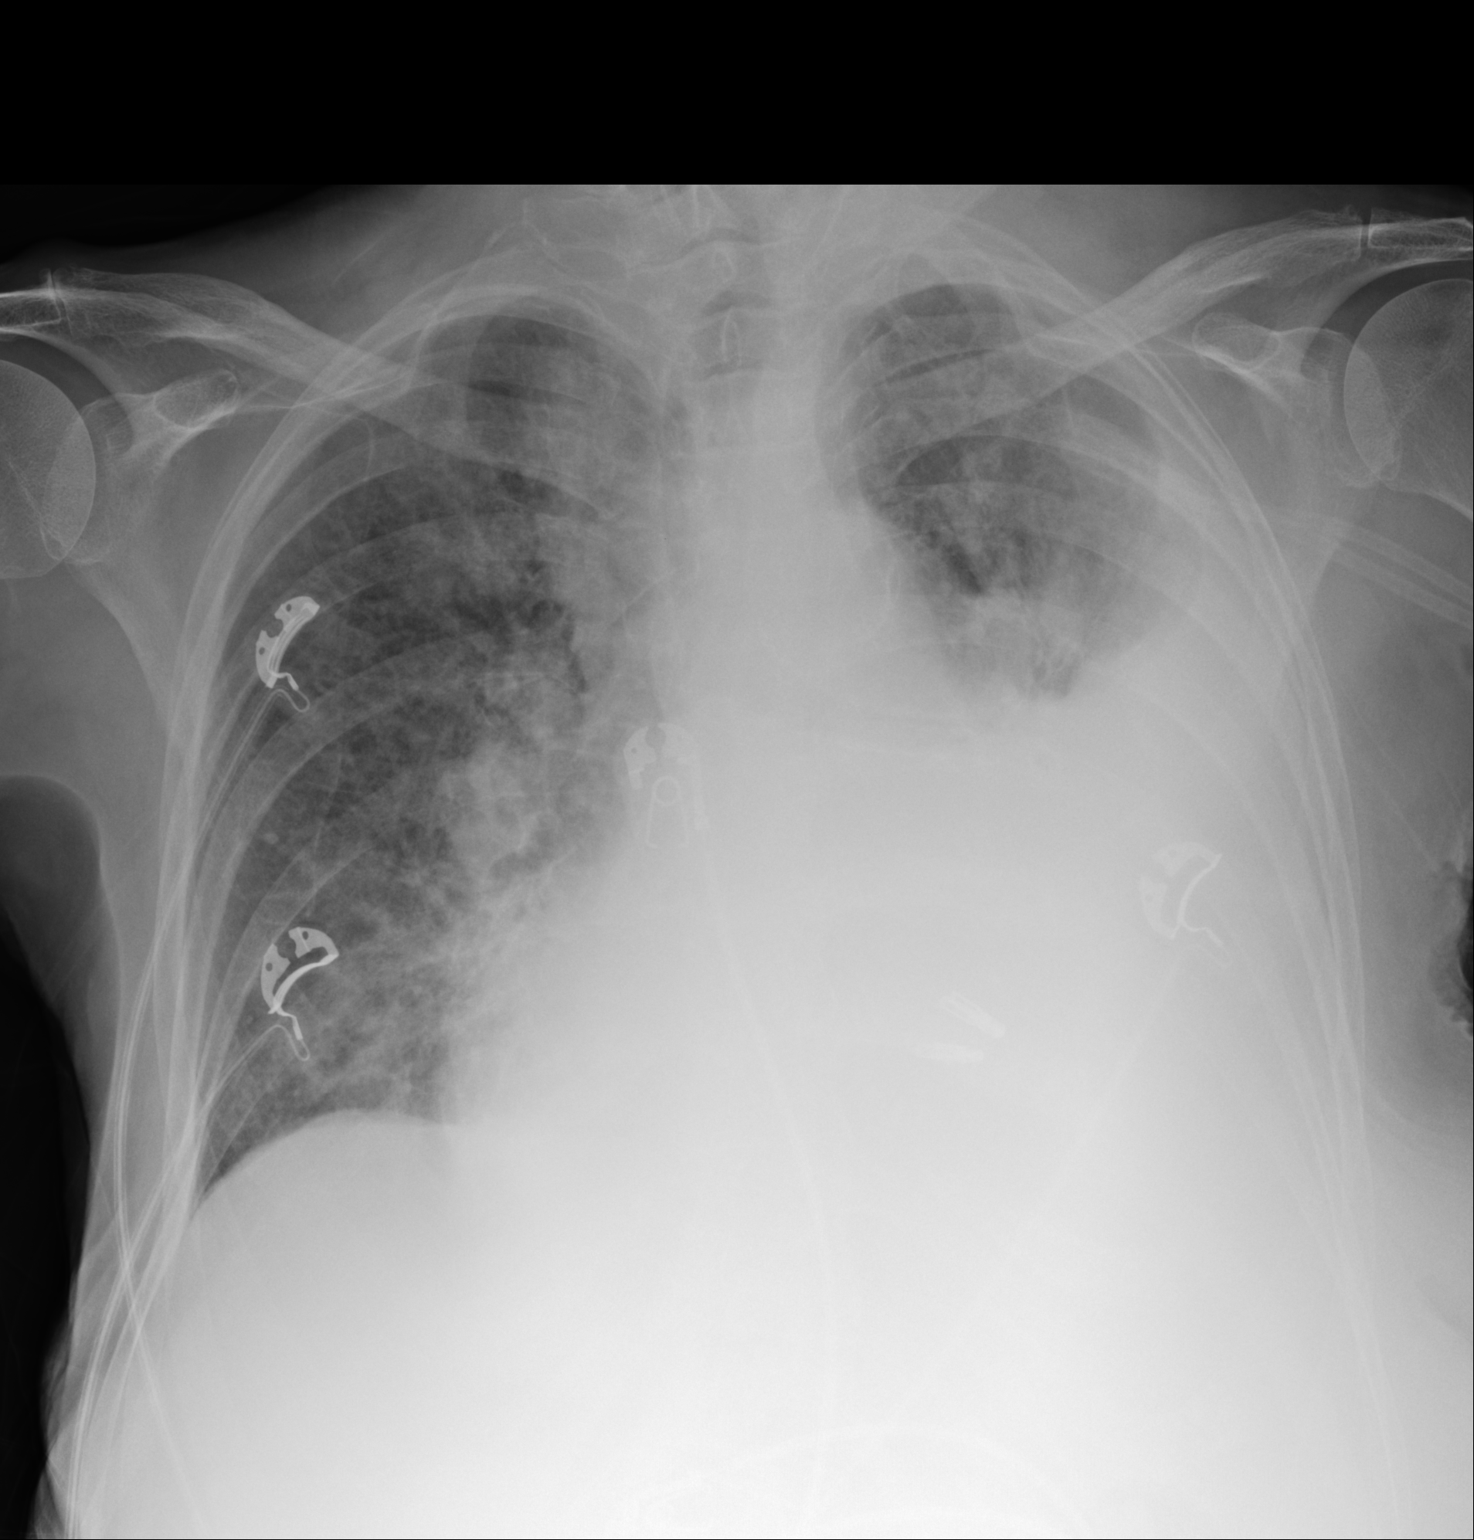

[1 of 1 positions shown; findings below may reference images not displayed]

FINDINGS: Enlargement of cardiac silhouette with pulmonary vascular
congestion.

Atherosclerotic calcification aorta.

Diffuse BILATERAL pulmonary infiltrates consistent with pulmonary
edema and CHF, new.

Large LEFT pleural effusion with significant atelectasis of the mid
to lower LEFT lung, significantly increased since 02/17/2017.

No definite RIGHT pleural effusion.

No pneumothorax.

Bones unremarkable.
IMPRESSION: CHF with significant interval increase in LEFT pleural effusion and
LEFT lung atelectasis since 02/17/2017.

## 2019-01-12 IMAGING — DX DG CHEST 1V
1 series · 1 of 1 positions shown · non-contrast
Comparison: 05/31/2017

CLINICAL DATA: LEFT pleural effusion post thoracentesis

EXAM:
CHEST 1 VIEW

[chest ap]
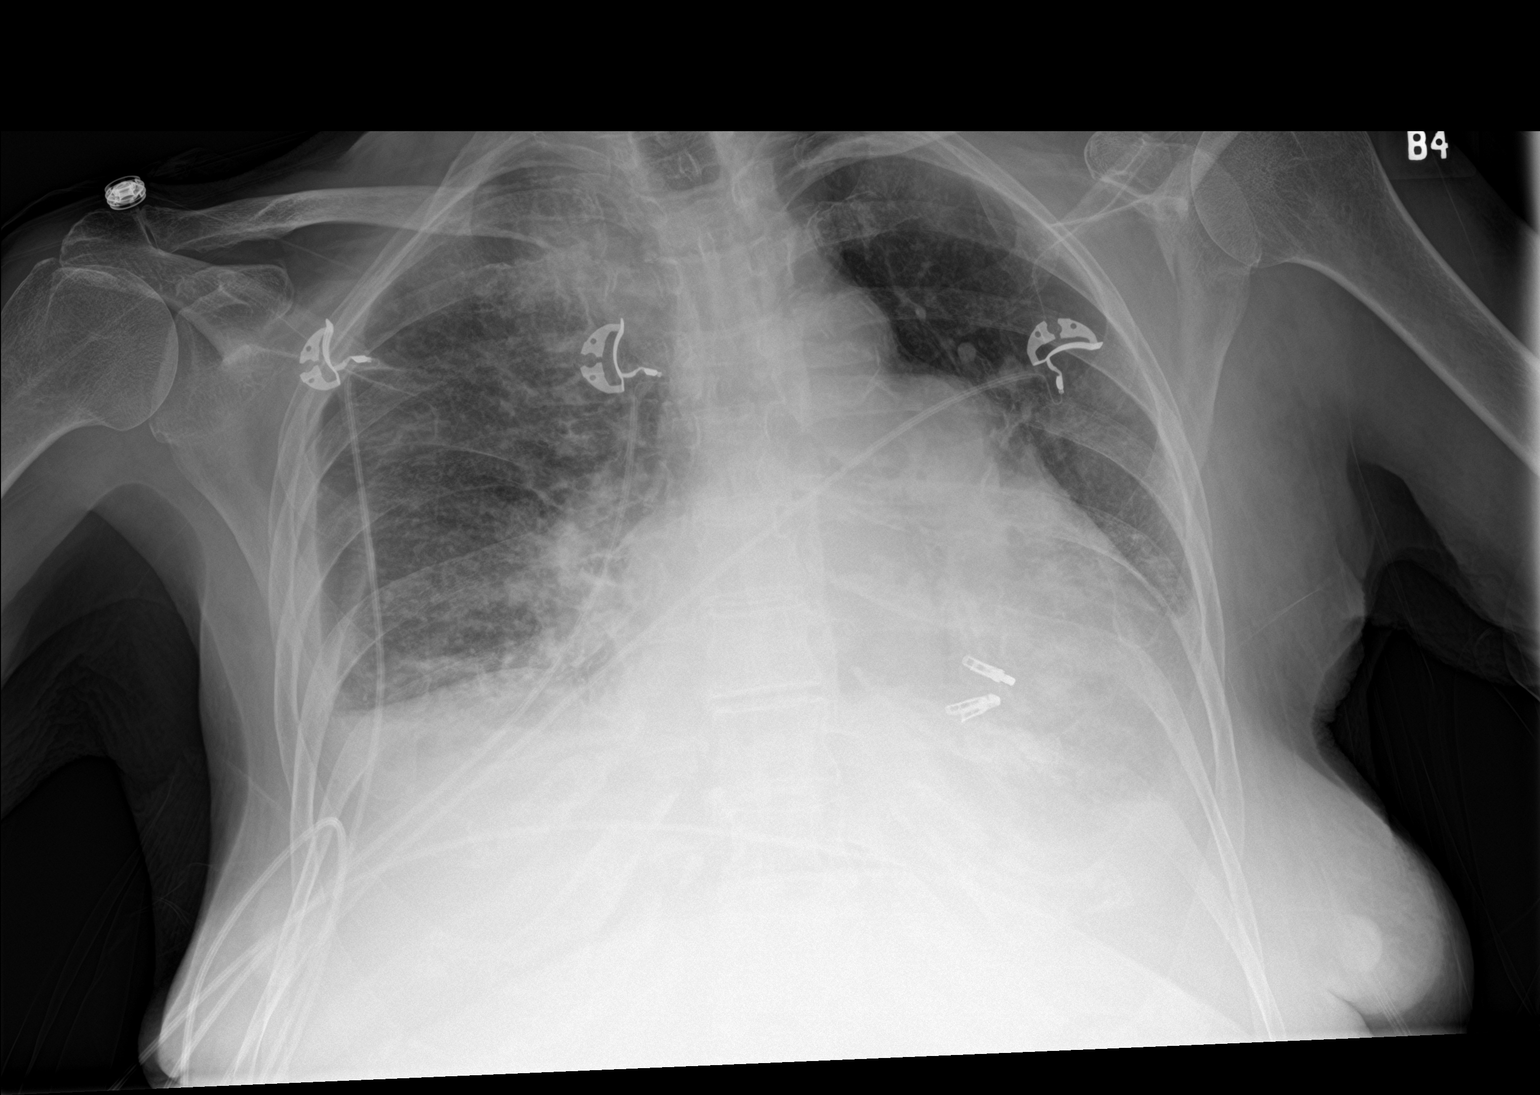

[1 of 1 positions shown; findings below may reference images not displayed]

FINDINGS: Enlargement of cardiac silhouette with pulmonary vascular
congestion.

Atherosclerotic calcification aorta.

Enlarged central pulmonary arteries.

Improved pulmonary infiltrates question pulmonary edema.

Markedly decreased LEFT pleural effusion and basilar atelectasis
post thoracentesis

Tiny LEFT apex pneumothorax.

Bones demineralized.
IMPRESSION: Tiny LEFT apex pneumothorax following thoracentesis.

Significantly decreased LEFT pleural effusion and basilar
atelectasis.

Improved pulmonary edema.

Findings called to Dr. Og on 06/02/2017 at 7119 hours.
